# Patient Record
Sex: Female | Born: 1970 | Race: Black or African American | Hispanic: No | Marital: Single | Smoking: Never smoker
Health system: Southern US, Community
[De-identification: ages and names within clinical notes are randomized; demographics above are authoritative.]

## PROBLEM LIST (undated history)

## (undated) DIAGNOSIS — T8859XA Other complications of anesthesia, initial encounter: Secondary | ICD-10-CM

## (undated) DIAGNOSIS — T4145XA Adverse effect of unspecified anesthetic, initial encounter: Secondary | ICD-10-CM

## (undated) DIAGNOSIS — R51 Headache: Secondary | ICD-10-CM

## (undated) DIAGNOSIS — G43909 Migraine, unspecified, not intractable, without status migrainosus: Secondary | ICD-10-CM

## (undated) DIAGNOSIS — Z5189 Encounter for other specified aftercare: Secondary | ICD-10-CM

## (undated) DIAGNOSIS — R7309 Other abnormal glucose: Secondary | ICD-10-CM

## (undated) DIAGNOSIS — K219 Gastro-esophageal reflux disease without esophagitis: Secondary | ICD-10-CM

## (undated) DIAGNOSIS — M329 Systemic lupus erythematosus, unspecified: Secondary | ICD-10-CM

## (undated) DIAGNOSIS — M1990 Unspecified osteoarthritis, unspecified site: Secondary | ICD-10-CM

## (undated) HISTORY — DX: Encounter for other specified aftercare: Z51.89

## (undated) HISTORY — PX: ABDOMINAL HYSTERECTOMY: SHX81

## (undated) HISTORY — DX: Other abnormal glucose: R73.09

## (undated) HISTORY — DX: Systemic lupus erythematosus, unspecified: M32.9

## (undated) HISTORY — PX: OVARIAN CYST SURGERY: SHX726

## (undated) HISTORY — PX: JOINT REPLACEMENT: SHX530

---

## 1998-09-26 ENCOUNTER — Encounter: Payer: Self-pay | Admitting: Obstetrics and Gynecology

## 1998-09-26 ENCOUNTER — Ambulatory Visit (HOSPITAL_COMMUNITY): Admission: RE | Admit: 1998-09-26 | Discharge: 1998-09-26 | Payer: Self-pay | Admitting: Obstetrics and Gynecology

## 1998-10-08 ENCOUNTER — Inpatient Hospital Stay (HOSPITAL_COMMUNITY): Admission: AD | Admit: 1998-10-08 | Discharge: 1998-10-11 | Payer: Self-pay | Admitting: Obstetrics and Gynecology

## 1998-10-08 ENCOUNTER — Encounter (INDEPENDENT_AMBULATORY_CARE_PROVIDER_SITE_OTHER): Payer: Self-pay | Admitting: Specialist

## 1998-10-12 ENCOUNTER — Encounter (HOSPITAL_COMMUNITY): Admission: RE | Admit: 1998-10-12 | Discharge: 1999-01-10 | Payer: Self-pay | Admitting: Obstetrics and Gynecology

## 2002-05-15 ENCOUNTER — Other Ambulatory Visit: Admission: RE | Admit: 2002-05-15 | Discharge: 2002-05-15 | Payer: Self-pay | Admitting: Obstetrics and Gynecology

## 2002-12-12 ENCOUNTER — Ambulatory Visit (HOSPITAL_COMMUNITY): Admission: RE | Admit: 2002-12-12 | Discharge: 2002-12-12 | Payer: Self-pay | Admitting: Obstetrics and Gynecology

## 2003-01-16 ENCOUNTER — Ambulatory Visit (HOSPITAL_COMMUNITY): Admission: RE | Admit: 2003-01-16 | Discharge: 2003-01-16 | Payer: Self-pay | Admitting: Obstetrics and Gynecology

## 2003-01-16 ENCOUNTER — Encounter (INDEPENDENT_AMBULATORY_CARE_PROVIDER_SITE_OTHER): Payer: Self-pay | Admitting: Specialist

## 2003-08-16 ENCOUNTER — Other Ambulatory Visit: Admission: RE | Admit: 2003-08-16 | Discharge: 2003-08-16 | Payer: Self-pay | Admitting: Obstetrics and Gynecology

## 2005-01-26 HISTORY — PX: OOPHORECTOMY: SHX86

## 2005-04-03 ENCOUNTER — Other Ambulatory Visit: Admission: RE | Admit: 2005-04-03 | Discharge: 2005-04-03 | Payer: Self-pay | Admitting: Obstetrics and Gynecology

## 2005-05-28 ENCOUNTER — Emergency Department (HOSPITAL_COMMUNITY): Admission: EM | Admit: 2005-05-28 | Discharge: 2005-05-28 | Payer: Self-pay | Admitting: Emergency Medicine

## 2008-06-12 ENCOUNTER — Ambulatory Visit (HOSPITAL_COMMUNITY): Admission: RE | Admit: 2008-06-12 | Discharge: 2008-06-12 | Payer: Self-pay | Admitting: Obstetrics and Gynecology

## 2008-06-12 ENCOUNTER — Encounter (INDEPENDENT_AMBULATORY_CARE_PROVIDER_SITE_OTHER): Payer: Self-pay | Admitting: Obstetrics and Gynecology

## 2009-06-03 ENCOUNTER — Encounter: Admission: RE | Admit: 2009-06-03 | Discharge: 2009-06-03 | Payer: Self-pay | Admitting: Orthopedic Surgery

## 2009-12-23 ENCOUNTER — Ambulatory Visit: Payer: Self-pay | Admitting: Vascular Surgery

## 2010-05-06 LAB — CBC
HCT: 40.6 % (ref 36.0–46.0)
Hemoglobin: 14 g/dL (ref 12.0–15.0)
MCHC: 34.4 g/dL (ref 30.0–36.0)
MCV: 88.8 fL (ref 78.0–100.0)
Platelets: 204 10*3/uL (ref 150–400)
RBC: 4.57 MIL/uL (ref 3.87–5.11)
RDW: 13.2 % (ref 11.5–15.5)
WBC: 6.9 10*3/uL (ref 4.0–10.5)

## 2010-05-06 LAB — URINALYSIS, MICROSCOPIC ONLY
Nitrite: NEGATIVE
Protein, ur: NEGATIVE mg/dL
Specific Gravity, Urine: 1.015 (ref 1.005–1.030)
Urobilinogen, UA: 0.2 mg/dL (ref 0.0–1.0)

## 2010-05-06 LAB — PREGNANCY, URINE: Preg Test, Ur: NEGATIVE

## 2010-06-10 NOTE — Op Note (Signed)
NAME:  Julia Monroe, Julia Monroe NO.:  [id number]   MEDICAL RECORD NO.:  [id number]          PATIENT TYPE:  AMB   LOCATION:  SDC                           FACILITY:  [facility]   PHYSICIAN:  Randye Lobo, M.D.   DATE OF BIRTH:  10-31-1970   DATE OF PROCEDURE:  06/12/2008  DATE OF DISCHARGE:  06/12/2008                               OPERATIVE REPORT   PREOPERATIVE DIAGNOSIS:  Complex left adnexal mass.   POSTOPERATIVE DIAGNOSES:  Left ovarian dermoid cyst, mild endometriosis,  right ovarian remnant.   PROCEDURE:  Laparoscopic left ovarian cystectomy with fulguration of  endometriosis and lysis of cul-de-sac adhesion.   SURGEON:  Randye Lobo, MD   ASSISTANT:  Luvenia Redden, MD   ANESTHESIA:  General endotracheal.   IV FLUIDS:  800 mL Ringer's lactate.   ESTIMATED BLOOD LOSS:  Minimal.   URINE OUTPUT:  150 mL.   COMPLICATIONS:  None.   INDICATIONS FOR THE PROCEDURE:  The patient is a 40 year old gravida 1,  para 1, African American female, status post right salpingo-oophorectomy  for a dermoid cyst in 2004, who presented with left lower quadrant pain  which she described as equivalent to which she had when she was  diagnosed with her right ovarian dermoid cyst.  Ultrasound May 17, 2008 documented a complex left adnexal mass which appeared to be  consistent with a probable dermoid cyst.  The patient had negative  gonorrhea and Chlamydia cultures at the same time.  The patient desired  surgical treatment of the pain and removal of the ovarian cyst.   Of note, the patient at the time of her prior laparoscopy had a normal-  appearing left ovary both by ultrasound evaluation and by laparoscopic  evaluation.   A plan is now made to proceed with a left ovarian cystectomy after  risks, benefits, and alternatives are reviewed.   FINDINGS:  Laparoscopy did demonstrate 2.5 cm left ovarian dermoid cyst  which had both sebaceous material and hair present.  The left  fallopian  tube was unremarkable.  In the right adnexal region, there was a small  area of normal-appearing ovarian tissue adjacent to the  infundibulopelvic ligament.  This area was very small and measured  probably 4-5 mm in diameter.   There was also evidence of endometriosis in the anterior cul-de-sac  along the vesicouterine fold and in the posterior cul-de-sac on both the  right and left sides.  There were also some filmy adhesions extending  from the posterior cul-de-sac in the midline over to the right cul-de-  sac.  This adhesion was lysed entirely.   In the upper abdomen, the liver appeared to be normal and the appendix  was also unremarkable.  There was no evidence of any adhesive disease in  the abdomen.   SPECIMENS:  The left ovarian cyst was sent to pathology.   PROCEDURE:  The patient was reidentified in the preoperative hold area.  She was taken to the operating room after she received Ancef for  antibiotic prophylaxis and TED hose and PAS stockings for DVT  prophylaxis.  The patient was placed in the supine position and general endotracheal  anesthesia was induced.  She was then placed in the dorsal lithotomy  position.  The abdomen, vagina, and perineum were then sterilely prepped  and draped.  A Foley catheter was placed inside the bladder.  The  speculum was placed inside the vagina and a single-tooth tenaculum was  placed on the anterior cervical lip.  This was replaced with a Hulka  tenaculum.   The procedure then began by creating a 1 cm vertical umbilical incision.  An attempt was made to place a 10-mm regular length trocar directly into  the abdomen, however, the patient had a fairly large panniculus and  instead the 10-mm reusable long trocar was used to accomplish this  without difficulty.   The laparoscope was inserted and confirmed proper placement of the  umbilical trocar and the CO2 pneumoperitoneum was achieved and the  patient was then placed  in the Trendelenburg position.  A 5-mm incisions  were then created in the right and left lower quadrants and 5-mm trocars  were placed under direct visualization of the laparoscope.   An evaluation of the pelvis and the abdomen was then performed and the  findings are as noted above.   The left ovary was grasped and the monopolar cautery was then used to  score the serosa overlying the dermoid cyst.  Using blunt technique, the  cyst was then shelled out of the surrounding ovarian tissue.  Hemostasis  was created with a combination of monopolar cautery and the Kleppinger  forceps.  During the enucleation of the specimen, the dermoid cyst was  entered, and the sebaceous material was appreciated.  This was suctioned  with the suction irrigator.   With the specimen completely removed and placed in the cul-de-sac, the  left lower quadrant incision was then enlarged to accommodate a 10-11 mm  reusable trocar which was placed under visualization of laparoscope.  The EndoCatch was then placed into the pelvis and the specimen was  removed from the abdominal cavity.  It was sent to pathology.   The trocar was then replaced again in the left lower quadrant incision  under visualization of the laparoscope and the pelvis was then irrigated  and suctioned.  The bed of the ovarian cystectomy site was then  cauterized with the Kleppinger forceps to create good hemostasis.   The lesions of endometriosis in the anterior and posterior cul-de-sacs  were then cauterized with the Kleppinger forceps.  The adhesions in the  cul-de-sac was sharply lysed and made hemostatic with a Kleppinger  forceps.   The pelvis was then irrigated and suctioned and all operative sites were  hemostatic and the procedure was therefore completed.  The lower  abdominal trocars were removed under visualization of the laparoscope.  The pneumoperitoneum was released and the 10-mm umbilical trocar and the  laparoscope were  removed simultaneously.   The left lower quadrant fascial incision was closed with figure-of-eight  suture of 0 Vicryl.  The skin was closed with subcuticular sutures of 3-  0 plain gut suture.  Sterile bandages were placed over the incisions.  The vaginal instruments and Foley catheter were removed.   This concluded the patient's procedure.  There were no complications.  All needle, instrument, sponge count were correct.  The patient is  escorted to the recovery room in stable and awake condition.      Randye Lobo, M.D.  Electronically Signed     BES/MEDQ  D:  06/12/2008  T:  06/13/2008  Job:  [id number]

## 2010-06-10 NOTE — Procedures (Signed)
LOWER EXTREMITY VENOUS REFLUX EXAM   INDICATION:   EXAM:  Using color-flow imaging and pulse Doppler spectral analysis, the  right and left common femoral, superficial femoral, popliteal, posterior  tibial, greater and lesser saphenous veins were evaluated.  There is no  evidence suggesting deep venous insufficiency in the right and left  lower extremity.   The right and left saphenofemoral junction is competent.  The right and  left GSV is competent.   The right and left proximal short saphenous veins demonstrate  competency.   GSV Diameter (used if found to be incompetent only)                                            Right    Left  Proximal Greater Saphenous Vein           cm       cm  Proximal-to-mid-thigh                     cm       cm  Mid thigh                                 cm       cm  Mid-distal thigh                          cm       cm  Distal thigh                              cm       cm  Knee                                      cm       cm   IMPRESSION:  1. The right and left greater saphenous vein is competent.  2. The right and left greater saphenous vein is not tortuous.  3. The deep venous system is competent.  4. The right and left short saphenous vein is competent.  5. There is a focal area of reflux within the right greater saphenous      vein proximal thigh in the lateral branch adjacent to a varicose      vein.  6. Discussed these findings with Dr. Hart Rochester.         ___________________________________________  Quita Skye. Hart Rochester, M.D.   OD/MEDQ  D:  12/23/2009  T:  12/23/2009  Job:  [id number]

## 2010-06-10 NOTE — Consult Note (Signed)
NEW PATIENT CONSULTATION   Julia Monroe, Julia Monroe  DOB:  02-21-1970                                       12/23/2009  ZOXWR#:[id number]   Patient is a 40 year old female patient referred for venous  insufficiency of both lower extremities, right worse than left.  She has  2 jobs which require her to be on her feet all day, and she develops an  aching, throbbing discomfort in the anterior thigh extending down into  the calf areas bilaterally as the day progresses.  She gets some  improvement with elevation but is unable to do that while at work.  She  has worn some long-leg stockings the last month or so with some mild  improvement and does take Aleve on a daily basis for arthritis.  She has  a history of DVT, thrombophlebitis, distal edema, or skin changes.   CHRONIC MEDICAL PROBLEMS:  None.  Denies diabetes, hypertension,  hyperlipidemia, coronary artery disease, COPD, or stroke.   SOCIAL HISTORY:  She is single, has 1 child.  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  She  does not use tobacco or alcohol.   FAMILY HISTORY:  Negative for coronary artery disease, diabetes, and  stroke, but her mother has bad varicose veins.   REVIEW OF SYSTEMS:  Positive for leg discomfort with walking.  Leg  discomfort while in the supine position.  Arthritis joint pain,  particular in her hips.  Muscle pain.  All other systems in complete  review of systems negative.   PHYSICAL EXAMINATION:  Blood pressure of 102/78, heart rate 72,  respirations 24.  General:  She is an obese female who is alert and  oriented x3.  She is well developed.  HEENT:  Normal for age.  EOMs  intact.  Lungs:  Clear to auscultation.  No rhonchi or wheezing.  Cardiovascular:  Regular rhythm.  No murmurs.  Carotid pulses are 3+.  No bruits audible.  Abdomen:  Soft, nontender with no masses.  Musculoskeletal:  Free of major deformities.  Neurologic:   Normal.  Skin:  Free of rashes.  Lower extremity exam reveals 3+ femoral,  popliteal, and dorsalis pedis pulses palpable bilaterally.  She has a  few small superficial varicosities in the right anterior thigh, in the  left anterior thigh, and lateral to the knee.  There is no distal edema,  hyperpigmentation, or other abnormalities noted.   Today I ordered venous duplex exam which I reviewed and interpreted.  The left deep system is normal.  The right deep system has mild reflux.  There is no DVT, and the great saphenous systems bilaterally are free of  reflux.  There is a small segment of reflux in the lateral accessory  branch on the right side in the midportion but not up at the  saphenofemoral junction level.   I reassured her regarding these findings and have recommended elastic  compression stockings and elevation as she is able to do.  If she should  decide she wants these treated, they could be treated with sclerotherapy  and if they progress over the years, she will be back in touch with [place],  as she may develop reflux in the great saphenous system as time passes.     Quita Skye Hart Rochester, M.D.  Electronically Signed  JDL/MEDQ  D:  12/23/2009  T:  12/23/2009  Job:  [id number]

## 2010-06-13 NOTE — Op Note (Signed)
NAME:  Julia Monroe, Julia Monroe                        ACCOUNT NO.:  [id number]   MEDICAL RECORD NO.:  [id number]                   PATIENT TYPE:  AMB   LOCATION:  SDC                                  FACILITY:  [facility]   PHYSICIAN:  Randye Lobo, M.D.                DATE OF BIRTH:  24-Jul-1970   DATE OF PROCEDURE:  01/16/2003  DATE OF DISCHARGE:                                 OPERATIVE REPORT   PREOPERATIVE DIAGNOSIS:  Right ovarian dermoid cyst.   POSTOPERATIVE DIAGNOSIS:  Right ovarian dermoid cyst.   PROCEDURE:  Laparoscopic right salpingo-oophorectomy.   SURGEON:  Randye Lobo, M.D.   ASSISTANT:  Carrington Clamp, M.D.   ANESTHESIA:  General endotracheal, local with 0.25% Marcaine.   FLUIDS REPLACED:  2300 mL Ringer's lactate.   ESTIMATED BLOOD LOSS:  Minimal.   URINE OUTPUT:  300 mL.   COMPLICATIONS:  None.   INDICATION FOR PROCEDURE:  The patient is a 40 year old gravida 1, para 1,  African-American female who presented to the office reporting right lower  quadrant and right incisional pain along her Pfannenstiel incision from a  previous cesarean delivery performed on October 08, 1998.  The patient had  a CT scan performed to evaluate the incision to rule out an incisional  hernia.  There was no evidence of a hernia; however, there was evidence of a  right ovarian cyst, which was thought to be consistent with a dermoid.  The  patient in follow-up had a pelvic ultrasound, which confirmed the presence  of a right ovarian cyst measuring 3.3 x 2.9 x 3.2 cm in diameter, which was  thought to be consistent with a dermoid.  The left ovary contained a simple  follicular cyst and no evidence of a dermoid.  A discussion was held with  the patient regarding her diagnosis, and a plan was made to proceed with a  right ovarian cystectomy and potentially a right salpingo-oophorectomy.  The  patient chose to proceed after the risks, benefits, and alternatives were  discussed with  her.   FINDINGS:  The patient is noted to have a very large pannus of the abdomen.   Laparoscopically, the patient has an enlarged right ovary measuring  approximately 4.5-5 cm in total diameter.  The left ovary contains multiple  follicular cysts.  Both of the fallopian tubes and the uterus are noted to  be normal.  There is no evidence of any adhesions nor endometriosis in the  abdomen or pelvis.  The liver had a normal appearance.   SPECIMENS:  The right tube and ovary were sent to pathology.   DESCRIPTION OF PROCEDURE:  The patient was re-identified in the preoperative  hold area.  The patient did receive Ancef 1 g intravenously for antibiotic  prophylaxis.  In the operating room, the patient was placed in the supine  position on the operating room table and general endotracheal anesthesia was  induced.  The patient was then placed in a dorsal lithotomy position.  The  abdomen and the vagina were then sterilely prepped and a Foley catheter was  placed inside the bladder.  A speculum was placed inside the vagina and a  single-tooth tenaculum was placed on the anterior cervical lip.  This was  replaced with a Hulka tenaculum and the remaining vaginal instruments were  removed.  The patient was sterilely draped.   Attention was turned to the abdomen, where a 1 cm umbilical incision was  created vertically and sharply with a scalpel.  This was carried down to the  subcutaneous tissue and the fascial region using an Allis clamp.  A regular-  length 10 mm trocar was then inserted into the peritoneal cavity; however,  the length of the trocar was not adequate and the peritoneum was not pierced  with the trocar.  This trocar was removed and the extra-long umbilical  trocar was then used and inserted directly into the peritoneal cavity  successfully.  The laparoscope confirmed proper placement, and a  pneumoperitoneum was achieved with CO2 gas.   The patient was then placed in a  Trendelenburg position.  A 5 mm right lower  quadrant incision was created underneath the patient's pannus, and a 5 mm  trocar was placed under direct visualization of the laparoscope.  A blunt-  tip probe was then used to examine the pelvic organs and the abdominal  organs, and the findings are as noted above.   A 5 mm suprapubic incision was then created approximately 2.5 cm above the  pubic symphysis.  There was difficulty with placement of the trocar,  initially due to the thickness of the abdominal wall and due to scarring  from the patient's previous cesarean section.  A 5 mm incision was therefore  created in the left lower quadrant, but again it was difficult to place the  trocar under visualization of the laparoscope, and this site was therefore  abandoned for placement.  The peritoneal cavity was not entered during this  attempt.  Ultimately, the suprapubic incision was used and the trocar could  be inserted successfully and under vision of the laparoscope.   There was significant weight of the abdominal wall during the procedure,  making the working space small, and a decision was made to proceed with a  right salpingo-oophorectomy.  The tripolar instrument was used to first  cauterize the utero-ovarian ligament.  It was then divided sharply with the  tripolar instrument.  The right ureter was identified, and the  infundibulopelvic ligament was then grasped above the level of the ureter  with the tripolar instrument, and then it was cauterized and successfully  divided.  The dissection continued along the broad ligament with the  tripolar instrument using cautery and cutting to complete the excision of  the fallopian tube and the ovary on the patient's right-hand side.  The  proximal right fallopian tube was then grasped with the tripolar instrument,  cauterized, and sharply divided.  The specimen was noted to be free at this point and was then placed in the anterior  cul-de-sac.  The operative sites  were re-examined and were re-cauterized with the tripolar instrument again  to create hemostasis in an area that was well away from the ureter.   The 5 mm right lower quadrant incision was then extended to accommodate a 10  mm trocar, which was placed under visualization of a laparoscope.  The  EndoCatch was then inserted  through the right lower quadrant incision and  the right fallopian tube and ovary were placed in the bag and brought up  through the uterine incision while removing the trocar as well.  The edges  of the plastic bag were grasped and the bag was opened to allow removal of  the fallopian tube and ovary, which was accomplished with morcellation.  Ultimately the dermoid cyst was opened sharply with a scalpel in order to  reduce tension on the cyst, and the bag and the remaining portion of ovary  were removed simultaneously.  There was no evidence of any spill inside the  peritoneal cavity.  The right tube and ovary were sent to pathology.   The 12 mm right lower quadrant trocar was then replaced under visualization  of the laparoscope and the operative site was re-examined.  The mesosalpinx  was re-cauterized with the tripolar instrument and hemostasis was noted to  be excellent at this time.  The pneumoperitoneum was then partially relieved  and again hemostasis was noted to be excellent, and the procedure was  therefore concluded.   The suprapubic trocar was removed under visualization of the laparoscope,  followed by the right lower quadrant trocar.  The umbilical trocar and the  laparoscope were removed simultaneously.   The superficial fascia of the right lower quadrant incision was then closed  with a single through-and-through suture of 0 Vicryl.  The skin incisions  were all closed with subcuticular sutures of 3-0 plain.  Steri-Strips were  placed over the incisions.   The instruments were removed from the vagina and the Foley  catheter was  taken out.  The patient was cleansed of remaining Betadine.   This concluded the patient's surgery.  There were no complications.  All  needle, instrument, and sponge counts were correct.                                               Randye Lobo, M.D.    BES/MEDQ  D:  01/16/2003  T:  01/16/2003  Job:  [id number]

## 2010-06-18 ENCOUNTER — Ambulatory Visit: Payer: BC Managed Care – PPO | Attending: Family Medicine | Admitting: Physical Therapy

## 2010-06-18 ENCOUNTER — Ambulatory Visit: Payer: Self-pay | Admitting: Physical Therapy

## 2010-06-18 DIAGNOSIS — IMO0001 Reserved for inherently not codable concepts without codable children: Secondary | ICD-10-CM | POA: Insufficient documentation

## 2010-06-18 DIAGNOSIS — M25559 Pain in unspecified hip: Secondary | ICD-10-CM | POA: Insufficient documentation

## 2010-06-18 DIAGNOSIS — M25659 Stiffness of unspecified hip, not elsewhere classified: Secondary | ICD-10-CM | POA: Insufficient documentation

## 2010-06-18 DIAGNOSIS — R5381 Other malaise: Secondary | ICD-10-CM | POA: Insufficient documentation

## 2010-06-26 ENCOUNTER — Ambulatory Visit: Payer: BC Managed Care – PPO | Admitting: Physical Therapy

## 2010-06-26 ENCOUNTER — Encounter: Payer: Self-pay | Admitting: Physical Therapy

## 2010-07-01 ENCOUNTER — Ambulatory Visit: Payer: BC Managed Care – PPO | Attending: Family Medicine | Admitting: Physical Therapy

## 2010-07-01 ENCOUNTER — Encounter: Payer: Self-pay | Admitting: Physical Therapy

## 2010-07-01 DIAGNOSIS — M25559 Pain in unspecified hip: Secondary | ICD-10-CM | POA: Insufficient documentation

## 2010-07-01 DIAGNOSIS — M25659 Stiffness of unspecified hip, not elsewhere classified: Secondary | ICD-10-CM | POA: Insufficient documentation

## 2010-07-01 DIAGNOSIS — R5381 Other malaise: Secondary | ICD-10-CM | POA: Insufficient documentation

## 2010-07-01 DIAGNOSIS — IMO0001 Reserved for inherently not codable concepts without codable children: Secondary | ICD-10-CM | POA: Insufficient documentation

## 2010-07-08 ENCOUNTER — Ambulatory Visit: Payer: BC Managed Care – PPO | Admitting: Physical Therapy

## 2010-07-08 ENCOUNTER — Encounter: Payer: Self-pay | Admitting: Physical Therapy

## 2010-07-15 ENCOUNTER — Ambulatory Visit: Payer: BC Managed Care – PPO | Admitting: Physical Therapy

## 2010-07-15 ENCOUNTER — Encounter: Payer: Self-pay | Admitting: Physical Therapy

## 2010-07-22 ENCOUNTER — Encounter: Payer: Self-pay | Admitting: Physical Therapy

## 2010-07-22 ENCOUNTER — Ambulatory Visit: Payer: BC Managed Care – PPO | Admitting: Physical Therapy

## 2010-12-31 ENCOUNTER — Other Ambulatory Visit (HOSPITAL_BASED_OUTPATIENT_CLINIC_OR_DEPARTMENT_OTHER): Payer: Self-pay | Admitting: Rheumatology

## 2010-12-31 DIAGNOSIS — M25559 Pain in unspecified hip: Secondary | ICD-10-CM

## 2011-01-03 ENCOUNTER — Ambulatory Visit (HOSPITAL_BASED_OUTPATIENT_CLINIC_OR_DEPARTMENT_OTHER)
Admission: RE | Admit: 2011-01-03 | Discharge: 2011-01-03 | Disposition: A | Discharge status: Home / Self Care | Payer: BC Managed Care – PPO | Source: Ambulatory Visit | Attending: Rheumatology | Admitting: Rheumatology

## 2011-01-03 DIAGNOSIS — M25559 Pain in unspecified hip: Secondary | ICD-10-CM | POA: Insufficient documentation

## 2011-01-03 DIAGNOSIS — M899 Disorder of bone, unspecified: Secondary | ICD-10-CM

## 2011-01-03 DIAGNOSIS — M25659 Stiffness of unspecified hip, not elsewhere classified: Secondary | ICD-10-CM

## 2011-01-03 DIAGNOSIS — M169 Osteoarthritis of hip, unspecified: Secondary | ICD-10-CM | POA: Insufficient documentation

## 2011-01-03 DIAGNOSIS — E278 Other specified disorders of adrenal gland: Secondary | ICD-10-CM

## 2011-01-03 DIAGNOSIS — M1610 Unilateral primary osteoarthritis, unspecified hip: Secondary | ICD-10-CM | POA: Insufficient documentation

## 2011-05-28 ENCOUNTER — Other Ambulatory Visit: Payer: Self-pay | Admitting: Orthopedic Surgery

## 2011-06-26 ENCOUNTER — Encounter (HOSPITAL_COMMUNITY): Payer: Self-pay | Admitting: Pharmacy Technician

## 2011-06-29 ENCOUNTER — Encounter (HOSPITAL_COMMUNITY)
Admission: RE | Admit: 2011-06-29 | Discharge: 2011-06-29 | Disposition: A | Discharge status: Home / Self Care | Payer: BC Managed Care – PPO | Source: Ambulatory Visit | Attending: Orthopedic Surgery | Admitting: Orthopedic Surgery

## 2011-06-29 ENCOUNTER — Encounter (HOSPITAL_COMMUNITY): Payer: Self-pay

## 2011-06-29 HISTORY — DX: Unspecified osteoarthritis, unspecified site: M19.90

## 2011-06-29 HISTORY — DX: Gastro-esophageal reflux disease without esophagitis: K21.9

## 2011-06-29 HISTORY — DX: Headache: R51

## 2011-06-29 LAB — CBC
HCT: 38.5 % (ref 36.0–46.0)
MCH: 27.9 pg (ref 26.0–34.0)
MCV: 85.2 fL (ref 78.0–100.0)
RDW: 14.7 % (ref 11.5–15.5)
WBC: 10.9 10*3/uL — ABNORMAL HIGH (ref 4.0–10.5)

## 2011-06-29 LAB — DIFFERENTIAL
Basophils Relative: 0 % (ref 0–1)
Eosinophils Absolute: 0.1 10*3/uL (ref 0.0–0.7)
Lymphs Abs: 2.7 10*3/uL (ref 0.7–4.0)
Monocytes Absolute: 0.7 10*3/uL (ref 0.1–1.0)
Monocytes Relative: 7 % (ref 3–12)
Neutro Abs: 7.3 10*3/uL (ref 1.7–7.7)

## 2011-06-29 LAB — SURGICAL PCR SCREEN
MRSA, PCR: NEGATIVE
Staphylococcus aureus: NEGATIVE

## 2011-06-29 LAB — URINALYSIS, ROUTINE W REFLEX MICROSCOPIC
Bilirubin Urine: NEGATIVE
Glucose, UA: NEGATIVE mg/dL
Hgb urine dipstick: NEGATIVE
Specific Gravity, Urine: 1.029 (ref 1.005–1.030)
Urobilinogen, UA: 0.2 mg/dL (ref 0.0–1.0)

## 2011-06-29 LAB — HCG, SERUM, QUALITATIVE: Preg, Serum: NEGATIVE

## 2011-06-29 LAB — PROTIME-INR: INR: 1.11 (ref 0.00–1.49)

## 2011-06-29 LAB — ABO/RH: ABO/RH(D): B POS

## 2011-06-29 MED ORDER — CHLORHEXIDINE GLUCONATE 4 % EX LIQD: 60.0000 mL | Freq: Once | CUTANEOUS | Status: DC

## 2011-06-29 NOTE — Pre-Procedure Instructions (Addendum)
20 Julia Monroe  06/29/2011   Your procedure is scheduled on:  *07/06/11  Report to [facility] at530* AM.  Call this number if you have problems the morning of surgery: [phone]   Remember:   Do not eat food:After Midnight.  May have clear liquids: up to 4 Hours before arrival 130 am.  Clear liquids include soda, tea, black coffee, apple or grape juice, broth.  Take these medicines the morning of surgery with A SIP OF WATER: tylenol #3 or tramodol  STOP diclfenac, multi vit, naproxen now   Do not wear jewelry, make-up or nail polish.  Do not wear lotions, powders, or perfumes. You may wear deodorant.  Do not shave 48 hours prior to surgery. Men may shave face and neck.  Do not bring valuables to the hospital.  Contacts, dentures or bridgework may not be worn into surgery.  Leave suitcase in the car. After surgery it may be brought to your room.  For patients admitted to the hospital, checkout time is 11:00 AM the day of discharge.   Patients discharged the day of surgery will not be allowed to drive home.  Name and phone number of your [phone] mom Julia Monroe   Special Instructions: Incentive Spirometry - Practice and bring it with you on the day of surgery. and CHG Shower Use Special Wash: 1/2 bottle night before surgery and 1/2 bottle morning of surgery.   Please read over the following fact sheets that you were given: Pain Booklet, Coughing and Deep Breathing, Blood Transfusion Information, Total Joint Packet, MRSA Information and Surgical Site Infection Prevention

## 2011-07-01 NOTE — H&P (Signed)
  Subjective: Julia Monroe is a 41 year old patient who has been seen by Dr. Madelon Lips for evaluation of left greater than right hip pain.  She reports that her pain began approximately 2 years ago.  She has tried several different oral anti-inflammatories and has had 2 intra-articular steroid injections with minimal relief.  At this point she is quite frustrated because her left hip pain limits her activities and makes it difficult for her to ambulate after prolonged sitting.  CURRENT MEDS:     Tramadol p.r.n., glucosamine, and ibuprofen.  ALLERGIES:     Migraine injection medication (she thinks) and naproxen.  FAMILY HISTORY:     Non-contributory  SOCIAL HISTORY:     Nonsmoker.  Does not drink any alcohol.  Drinks decaffeinated coffee. Does not exercise.  REVIEW OF SYSTEMS:     Systemic review is remarkable for pain and tenderness. The rest of the systems are negative.  Please refer to Rheumatology visit form for details.  Objective: Well-developed and well-nourished 41 year old female.  She is 5 feet 9 inches tall and weighs 310 pounds.  BMI is 46.  Examination of the left hip demonstrates pain with internal and external rotation.  Foot tap is negative.  The right hip has minimal pain with range of motion.  Neurovascular exam is within normal limits.  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  At this point she is a candidate for left hip replacement, however we have asked that she try to get her weight down to 275 to bring her BMI closer to 40.  This will reduce many of the surgical risks.  In the meantime, she'll continue using tramadol and Tylenol for pain control.  We've also given her a prescription for Relafen to be taken twice daily.  She was advised to try exercising in  the pool and to focus on dietary changes for weight loss.

## 2011-07-05 MED ORDER — CEFAZOLIN SODIUM-DEXTROSE 2-3 GM-% IV SOLR
2.0000 g | INTRAVENOUS | Status: AC
  Administered 2011-07-06: 2 g via INTRAVENOUS
  Filled 2011-07-05: qty 50

## 2011-07-06 ENCOUNTER — Ambulatory Visit (HOSPITAL_COMMUNITY): Payer: BC Managed Care – PPO | Admitting: Anesthesiology

## 2011-07-06 ENCOUNTER — Encounter (HOSPITAL_COMMUNITY)
Admission: RE | Disposition: A | Discharge status: Home / Self Care | Payer: Self-pay | Source: Ambulatory Visit | Attending: Orthopedic Surgery

## 2011-07-06 ENCOUNTER — Encounter (HOSPITAL_COMMUNITY): Payer: Self-pay | Admitting: *Deleted

## 2011-07-06 ENCOUNTER — Encounter (HOSPITAL_COMMUNITY): Payer: Self-pay | Admitting: Anesthesiology

## 2011-07-06 ENCOUNTER — Inpatient Hospital Stay (HOSPITAL_COMMUNITY): Payer: BC Managed Care – PPO

## 2011-07-06 ENCOUNTER — Inpatient Hospital Stay (HOSPITAL_COMMUNITY)
Admission: RE | Admit: 2011-07-06 | Discharge: 2011-07-10 | DRG: 818 | Disposition: A | Discharge status: Home / Self Care | Payer: BC Managed Care – PPO | Source: Ambulatory Visit | Attending: Orthopedic Surgery | Admitting: Orthopedic Surgery

## 2011-07-06 DIAGNOSIS — K219 Gastro-esophageal reflux disease without esophagitis: Secondary | ICD-10-CM | POA: Diagnosis present

## 2011-07-06 DIAGNOSIS — M1610 Unilateral primary osteoarthritis, unspecified hip: Principal | ICD-10-CM | POA: Diagnosis present

## 2011-07-06 DIAGNOSIS — M169 Osteoarthritis of hip, unspecified: Secondary | ICD-10-CM

## 2011-07-06 DIAGNOSIS — D62 Acute posthemorrhagic anemia: Secondary | ICD-10-CM | POA: Diagnosis not present

## 2011-07-06 DIAGNOSIS — M1611 Unilateral primary osteoarthritis, right hip: Secondary | ICD-10-CM | POA: Diagnosis present

## 2011-07-06 DIAGNOSIS — Z6841 Body Mass Index (BMI) 40.0 and over, adult: Secondary | ICD-10-CM

## 2011-07-06 HISTORY — PX: TOTAL HIP ARTHROPLASTY: SHX124

## 2011-07-06 SURGERY — ARTHROPLASTY, HIP, TOTAL,POSTERIOR APPROACH
Anesthesia: General | Site: Hip | Laterality: Left | Wound class: Clean

## 2011-07-06 MED ORDER — METOCLOPRAMIDE HCL 5 MG/ML IJ SOLN: 5.0000 mg | Freq: Three times a day (TID) | INTRAMUSCULAR | Status: DC | PRN

## 2011-07-06 MED ORDER — SUCCINYLCHOLINE CHLORIDE 20 MG/ML IJ SOLN
INTRAMUSCULAR | Status: DC | PRN
  Administered 2011-07-06: 120 mg via INTRAVENOUS

## 2011-07-06 MED ORDER — ROCURONIUM BROMIDE 100 MG/10ML IV SOLN
INTRAVENOUS | Status: DC | PRN
  Administered 2011-07-06: 10 mg via INTRAVENOUS
  Administered 2011-07-06: 40 mg via INTRAVENOUS

## 2011-07-06 MED ORDER — ONDANSETRON HCL 4 MG/2ML IJ SOLN: 4.0000 mg | Freq: Once | INTRAMUSCULAR | Status: DC | PRN

## 2011-07-06 MED ORDER — OXYCODONE-ACETAMINOPHEN 5-325 MG PO TABS
1.0000 | ORAL_TABLET | ORAL | Status: DC | PRN
  Administered 2011-07-06 – 2011-07-07 (×4): 2 via ORAL
  Administered 2011-07-07: 1 via ORAL
  Administered 2011-07-07: 2 via ORAL
  Administered 2011-07-07: 1 via ORAL
  Administered 2011-07-07 – 2011-07-10 (×11): 2 via ORAL
  Filled 2011-07-06 (×19): qty 2

## 2011-07-06 MED ORDER — MORPHINE SULFATE 10 MG/ML IJ SOLN
INTRAMUSCULAR | Status: DC | PRN
  Administered 2011-07-06: 4 mg via INTRAVENOUS
  Administered 2011-07-06: 2 mg via INTRAVENOUS
  Administered 2011-07-06: 4 mg via INTRAVENOUS

## 2011-07-06 MED ORDER — ACETAMINOPHEN 650 MG RE SUPP: 650.0000 mg | Freq: Four times a day (QID) | RECTAL | Status: DC | PRN

## 2011-07-06 MED ORDER — GLYCOPYRROLATE 0.2 MG/ML IJ SOLN
INTRAMUSCULAR | Status: DC | PRN
  Administered 2011-07-06: .9 mg via INTRAVENOUS

## 2011-07-06 MED ORDER — MAGNESIUM HYDROXIDE 400 MG/5ML PO SUSP: 30.0000 mL | Freq: Every day | ORAL | Status: DC | PRN

## 2011-07-06 MED ORDER — VECURONIUM BROMIDE 10 MG IV SOLR
INTRAVENOUS | Status: DC | PRN
  Administered 2011-07-06: 3 mg via INTRAVENOUS
  Administered 2011-07-06: 2 mg via INTRAVENOUS

## 2011-07-06 MED ORDER — LACTATED RINGERS IV SOLN
INTRAVENOUS | Status: DC | PRN
  Administered 2011-07-06 (×2): via INTRAVENOUS

## 2011-07-06 MED ORDER — ONDANSETRON HCL 4 MG/2ML IJ SOLN
INTRAMUSCULAR | Status: DC | PRN
  Administered 2011-07-06: 4 mg via INTRAVENOUS

## 2011-07-06 MED ORDER — ONDANSETRON HCL 4 MG/2ML IJ SOLN: 4.0000 mg | Freq: Four times a day (QID) | INTRAMUSCULAR | Status: DC | PRN

## 2011-07-06 MED ORDER — LIDOCAINE HCL (CARDIAC) 20 MG/ML IV SOLN
INTRAVENOUS | Status: DC | PRN
  Administered 2011-07-06: 80 mg via INTRAVENOUS

## 2011-07-06 MED ORDER — MIDAZOLAM HCL 5 MG/5ML IJ SOLN
INTRAMUSCULAR | Status: DC | PRN
  Administered 2011-07-06: 1 mg via INTRAVENOUS

## 2011-07-06 MED ORDER — PHENOL 1.4 % MT LIQD: 1.0000 | OROMUCOSAL | Status: DC | PRN

## 2011-07-06 MED ORDER — METHOCARBAMOL 500 MG PO TABS
500.0000 mg | ORAL_TABLET | Freq: Four times a day (QID) | ORAL | Status: DC | PRN
  Administered 2011-07-06 – 2011-07-10 (×13): 500 mg via ORAL
  Filled 2011-07-06 (×15): qty 1

## 2011-07-06 MED ORDER — HYDROMORPHONE HCL PF 1 MG/ML IJ SOLN
0.2500 mg | INTRAMUSCULAR | Status: DC | PRN
  Administered 2011-07-06 (×3): 0.5 mg via INTRAVENOUS

## 2011-07-06 MED ORDER — FENTANYL CITRATE 0.05 MG/ML IJ SOLN
INTRAMUSCULAR | Status: DC | PRN
  Administered 2011-07-06: 100 ug via INTRAVENOUS
  Administered 2011-07-06: 200 ug via INTRAVENOUS
  Administered 2011-07-06 (×5): 50 ug via INTRAVENOUS
  Administered 2011-07-06: 100 ug via INTRAVENOUS
  Administered 2011-07-06: 50 ug via INTRAVENOUS

## 2011-07-06 MED ORDER — ACETAMINOPHEN 10 MG/ML IV SOLN
INTRAVENOUS | Status: AC
  Filled 2011-07-06: qty 100

## 2011-07-06 MED ORDER — HYDROMORPHONE HCL PF 1 MG/ML IJ SOLN
INTRAMUSCULAR | Status: AC
  Administered 2011-07-06: 1 mg via INTRAVENOUS
  Filled 2011-07-06: qty 1

## 2011-07-06 MED ORDER — PROPOFOL 10 MG/ML IV EMUL
INTRAVENOUS | Status: DC | PRN
  Administered 2011-07-06: 10 mg via INTRAVENOUS
  Administered 2011-07-06: 120 mg via INTRAVENOUS
  Administered 2011-07-06: 200 mg via INTRAVENOUS

## 2011-07-06 MED ORDER — MENTHOL 3 MG MT LOZG: 1.0000 | LOZENGE | OROMUCOSAL | Status: DC | PRN

## 2011-07-06 MED ORDER — ACETAMINOPHEN 10 MG/ML IV SOLN
INTRAVENOUS | Status: DC | PRN
  Administered 2011-07-06: 1000 mg via INTRAVENOUS

## 2011-07-06 MED ORDER — ASPIRIN EC 325 MG PO TBEC
325.0000 mg | DELAYED_RELEASE_TABLET | Freq: Two times a day (BID) | ORAL | Status: DC
  Administered 2011-07-06 – 2011-07-10 (×9): 325 mg via ORAL
  Filled 2011-07-06 (×10): qty 1

## 2011-07-06 MED ORDER — KCL IN DEXTROSE-NACL 20-5-0.45 MEQ/L-%-% IV SOLN
INTRAVENOUS | Status: DC
  Administered 2011-07-06 (×2): via INTRAVENOUS
  Filled 2011-07-06 (×15): qty 1000

## 2011-07-06 MED ORDER — HYDROMORPHONE HCL PF 1 MG/ML IJ SOLN
0.5000 mg | INTRAMUSCULAR | Status: DC | PRN
  Administered 2011-07-06 – 2011-07-10 (×12): 1 mg via INTRAVENOUS
  Filled 2011-07-06 (×13): qty 1

## 2011-07-06 MED ORDER — NEOSTIGMINE METHYLSULFATE 1 MG/ML IJ SOLN
INTRAMUSCULAR | Status: DC | PRN
  Administered 2011-07-06: 5 mg via INTRAVENOUS

## 2011-07-06 MED ORDER — ZOLPIDEM TARTRATE 5 MG PO TABS: 5.0000 mg | ORAL_TABLET | Freq: Every evening | ORAL | Status: DC | PRN

## 2011-07-06 MED ORDER — METOCLOPRAMIDE HCL 10 MG PO TABS: 5.0000 mg | ORAL_TABLET | Freq: Three times a day (TID) | ORAL | Status: DC | PRN

## 2011-07-06 MED ORDER — ACETAMINOPHEN 325 MG PO TABS
650.0000 mg | ORAL_TABLET | Freq: Four times a day (QID) | ORAL | Status: DC | PRN
  Administered 2011-07-08 – 2011-07-10 (×3): 650 mg via ORAL
  Filled 2011-07-06 (×3): qty 2

## 2011-07-06 MED ORDER — SODIUM CHLORIDE 0.9 % IR SOLN
Status: DC | PRN
  Administered 2011-07-06: 1000 mL

## 2011-07-06 MED ORDER — DEXTROSE-NACL 5-0.45 % IV SOLN: INTRAVENOUS | Status: DC

## 2011-07-06 MED ORDER — FLEET ENEMA 7-19 GM/118ML RE ENEM: 1.0000 | ENEMA | Freq: Once | RECTAL | Status: AC | PRN

## 2011-07-06 MED ORDER — LIDOCAINE HCL 4 % MT SOLN
OROMUCOSAL | Status: DC | PRN
  Administered 2011-07-06: 4 mL via TOPICAL

## 2011-07-06 MED ORDER — BISACODYL 5 MG PO TBEC
5.0000 mg | DELAYED_RELEASE_TABLET | Freq: Every day | ORAL | Status: DC | PRN
  Administered 2011-07-07 – 2011-07-10 (×3): 5 mg via ORAL
  Filled 2011-07-06 (×3): qty 1

## 2011-07-06 MED ORDER — ALUM & MAG HYDROXIDE-SIMETH 200-200-20 MG/5ML PO SUSP: 30.0000 mL | ORAL | Status: DC | PRN

## 2011-07-06 MED ORDER — ONDANSETRON HCL 4 MG PO TABS: 4.0000 mg | ORAL_TABLET | Freq: Four times a day (QID) | ORAL | Status: DC | PRN

## 2011-07-06 MED ORDER — DROPERIDOL 2.5 MG/ML IJ SOLN
INTRAMUSCULAR | Status: DC | PRN
  Administered 2011-07-06: 0.625 mg via INTRAVENOUS

## 2011-07-06 MED ORDER — OXYCODONE HCL 5 MG PO TABS
5.0000 mg | ORAL_TABLET | ORAL | Status: DC | PRN
  Administered 2011-07-06: 5 mg via ORAL
  Administered 2011-07-06 – 2011-07-10 (×9): 10 mg via ORAL
  Filled 2011-07-06 (×6): qty 2
  Filled 2011-07-06: qty 1
  Filled 2011-07-06 (×5): qty 2

## 2011-07-06 MED ORDER — METHOCARBAMOL 100 MG/ML IJ SOLN
500.0000 mg | Freq: Four times a day (QID) | INTRAVENOUS | Status: DC | PRN
  Administered 2011-07-06: 500 mg via INTRAVENOUS
  Filled 2011-07-06: qty 5

## 2011-07-06 MED ORDER — BUPIVACAINE-EPINEPHRINE 0.5% -1:200000 IJ SOLN
INTRAMUSCULAR | Status: DC | PRN
  Administered 2011-07-06: 30 mL

## 2011-07-06 MED ORDER — DIPHENHYDRAMINE HCL 12.5 MG/5ML PO ELIX: 12.5000 mg | ORAL_SOLUTION | ORAL | Status: DC | PRN

## 2011-07-06 MED ORDER — HYDROCODONE-ACETAMINOPHEN 5-325 MG PO TABS: 1.0000 | ORAL_TABLET | ORAL | Status: DC | PRN

## 2011-07-06 SURGICAL SUPPLY — 57 items
BLADE SAW SAG 73X25 THK (BLADE) ×1
BLADE SAW SGTL 18X1.27X75 (BLADE) IMPLANT
BLADE SAW SGTL 73X25 THK (BLADE) ×1 IMPLANT
BLADE SAW SGTL MED 73X18.5 STR (BLADE) IMPLANT
BRUSH FEMORAL CANAL (MISCELLANEOUS) IMPLANT
CLOTH BEACON ORANGE TIMEOUT ST (SAFETY) ×2 IMPLANT
COVER BACK TABLE 24X17X13 BIG (DRAPES) IMPLANT
COVER SURGICAL LIGHT HANDLE (MISCELLANEOUS) ×4 IMPLANT
DRAPE ORTHO SPLIT 77X108 STRL (DRAPES) ×2
DRAPE PROXIMA HALF (DRAPES) ×2 IMPLANT
DRAPE SURG ORHT 6 SPLT 77X108 (DRAPES) ×1 IMPLANT
DRAPE U-SHAPE 47X51 STRL (DRAPES) ×2 IMPLANT
DRILL BIT 7/64X5 (BIT) ×2 IMPLANT
DRSG MEPILEX BORDER 4X12 (GAUZE/BANDAGES/DRESSINGS) ×2 IMPLANT
DRSG MEPILEX BORDER 4X8 (GAUZE/BANDAGES/DRESSINGS) ×1 IMPLANT
DURAPREP 26ML APPLICATOR (WOUND CARE) ×2 IMPLANT
ELECT BLADE 4.0 EZ CLEAN MEGAD (MISCELLANEOUS) ×2
ELECT REM PT RETURN 9FT ADLT (ELECTROSURGICAL) ×2
ELECTRODE BLDE 4.0 EZ CLN MEGD (MISCELLANEOUS) IMPLANT
ELECTRODE REM PT RTRN 9FT ADLT (ELECTROSURGICAL) ×1 IMPLANT
GAUZE XEROFORM 1X8 LF (GAUZE/BANDAGES/DRESSINGS) ×2 IMPLANT
GLOVE BIO SURGEON STRL SZ 6.5 (GLOVE) ×1 IMPLANT
GLOVE BIO SURGEON STRL SZ7 (GLOVE) ×2 IMPLANT
GLOVE BIO SURGEON STRL SZ7.5 (GLOVE) ×3 IMPLANT
GLOVE BIOGEL PI IND STRL 6.5 (GLOVE) IMPLANT
GLOVE BIOGEL PI IND STRL 7.0 (GLOVE) ×1 IMPLANT
GLOVE BIOGEL PI IND STRL 8 (GLOVE) ×1 IMPLANT
GLOVE BIOGEL PI INDICATOR 6.5 (GLOVE) ×1
GLOVE BIOGEL PI INDICATOR 7.0 (GLOVE) ×2
GLOVE BIOGEL PI INDICATOR 8 (GLOVE) ×1
GLOVE SURG SS PI 6.5 STRL IVOR (GLOVE) ×1 IMPLANT
GOWN PREVENTION PLUS XLARGE (GOWN DISPOSABLE) ×2 IMPLANT
GOWN STRL NON-REIN LRG LVL3 (GOWN DISPOSABLE) ×5 IMPLANT
HANDPIECE INTERPULSE COAX TIP (DISPOSABLE)
HOOD PEEL AWAY FACE SHEILD DIS (HOOD) ×5 IMPLANT
KIT BASIN OR (CUSTOM PROCEDURE TRAY) ×2 IMPLANT
KIT ROOM TURNOVER OR (KITS) ×2 IMPLANT
MANIFOLD NEPTUNE II (INSTRUMENTS) ×2 IMPLANT
NEEDLE 22X1 1/2 (OR ONLY) (NEEDLE) ×2 IMPLANT
NS IRRIG 1000ML POUR BTL (IV SOLUTION) ×2 IMPLANT
PACK TOTAL JOINT (CUSTOM PROCEDURE TRAY) ×2 IMPLANT
PAD ARMBOARD 7.5X6 YLW CONV (MISCELLANEOUS) ×4 IMPLANT
PASSER SUT SWANSON 36MM LOOP (INSTRUMENTS) ×2 IMPLANT
PRESSURIZER FEMORAL UNIV (MISCELLANEOUS) IMPLANT
SET HNDPC FAN SPRY TIP SCT (DISPOSABLE) IMPLANT
SUT ETHIBOND 2 V 37 (SUTURE) ×2 IMPLANT
SUT ETHILON 3 0 FSL (SUTURE) ×2 IMPLANT
SUT VIC AB 0 CTB1 27 (SUTURE) ×2 IMPLANT
SUT VIC AB 1 CTX 36 (SUTURE) ×2
SUT VIC AB 1 CTX36XBRD ANBCTR (SUTURE) ×1 IMPLANT
SUT VIC AB 2-0 CTB1 (SUTURE) ×2 IMPLANT
SYR CONTROL 10ML LL (SYRINGE) ×2 IMPLANT
TOWEL OR 17X24 6PK STRL BLUE (TOWEL DISPOSABLE) ×2 IMPLANT
TOWEL OR 17X26 10 PK STRL BLUE (TOWEL DISPOSABLE) ×2 IMPLANT
TOWER CARTRIDGE SMART MIX (DISPOSABLE) IMPLANT
TRAY FOLEY CATH 14FR (SET/KITS/TRAYS/PACK) ×1 IMPLANT
WATER STERILE IRR 1000ML POUR (IV SOLUTION) ×6 IMPLANT

## 2011-07-06 NOTE — Preoperative (Signed)
Beta Blockers   Reason not to administer Beta Blockers:Not Applicable 

## 2011-07-06 NOTE — Anesthesia Preprocedure Evaluation (Signed)
Anesthesia Evaluation  Patient identified by MRN, date of birth, ID band Patient awake    Reviewed: Allergy & Precautions, H&P , NPO status , Patient's Chart, lab work & pertinent test results  Airway Mallampati: I TM Distance: >3 FB Neck ROM: full    Dental   Pulmonary          Cardiovascular Rhythm:regular Rate:Normal     Neuro/Psych  Headaches,    GI/Hepatic GERD-  ,  Endo/Other    Renal/GU      Musculoskeletal   Abdominal   Peds  Hematology   Anesthesia Other Findings   Reproductive/Obstetrics                           Anesthesia Physical Anesthesia Plan  ASA: I  Anesthesia Plan: General   Post-op Pain Management:    Induction: Intravenous  Airway Management Planned: Oral ETT  Additional Equipment:   Intra-op Plan:   Post-operative Plan: Extubation in OR  Informed Consent: I have reviewed the patients History and Physical, chart, labs and discussed the procedure including the risks, benefits and alternatives for the proposed anesthesia with the patient or authorized representative who has indicated his/her understanding and acceptance.     Plan Discussed with: CRNA, Anesthesiologist and Surgeon  Anesthesia Plan Comments:         Anesthesia Quick Evaluation

## 2011-07-06 NOTE — Progress Notes (Signed)
Orthopedic Tech Progress Note Patient Details:  JOAQUINA NISSEN 1970-02-18 [id number] Trapeze bar     Cammer, Mickie Bail 07/06/2011, 12:54 PM

## 2011-07-06 NOTE — Op Note (Signed)
OPERATIVE REPORT    DATE OF PROCEDURE:  07/06/2011       PREOPERATIVE DIAGNOSIS:  OSTEOARTHRITIS LEFT HIP                                                          POSTOPERATIVE DIAGNOSIS:  OSTEOARTHRITIS LEFT HIP                                                           PROCEDURE:  L total hip arthroplasty using a 52 mm DePuy Pinnacle  Cup, [employer], 10-degree polyethylene liner index superior  , a +0 36 mm ceramic head, a [id number] SROM stem, 20Bsm Cone   SURGEON: Jaliel Deavers J    ASSISTANT:   Mauricia Area, PA-C  (present throughout entire procedure and necessary for timely completion of the procedure)   ANESTHESIA: General BLOOD LOSS: 800 FLUID REPLACEMENT: 2000 crystalloid DRAINS: Foley Catheter URINE OUTPUT: 300cc COMPLICATIONS: none    INDICATIONS FOR PROCEDURE: A 41 y.o. year-old With  OSTEOARTHRITIS LEFT HIP   for 2 years, x-rays show bone-on-bone arthritic changes. Despite conservative measures with observation, anti-inflammatory medicine, narcotics, use of a cane, has severe unremitting pain and can ambulate only a few blocks before resting.  Patient desires elective L total hip arthroplasty to decrease pain and increase function. The risks, benefits, and alternatives were discussed at length including but not limited to the risks of infection, bleeding, nerve injury, stiffness, blood clots, the need for revision surgery, cardiopulmonary complications, among others, and they were willing to proceed.y have been discussed. Questions answered.     PROCEDURE IN DETAIL: The patient was identified by armband,  received preoperative IV antibiotics in the holding area at [facility], taken to the operating room , appropriate anesthetic monitors  were attached and general endotracheal anesthesia induced. Foley catheter was inserted. she was rolled into the R lateral decubitus position and fixed there with a Stulberg Mark II pelvic clamp and the L lower  extremity was then prepped and draped  in the usual sterile fashion from the ankle to the hemipelvis. A time-out  procedure was performed. The skin along the lateral hip and thigh  infiltrated with 10 mL of 0.5% Marcaine and epinephrine solution. We  then made a posterolateral approach to the hip. With a #10 blade, 25 cm  incision through skin and subcutaneous tissue down to the level of the  IT band. Small bleeders were identified and cauterized. IT band cut in  line with skin incision exposing the greater trochanter. A Cobra retractor was placed between the gluteus minimus and the superior hip joint capsule, and a spiked Cobra between the quadratus femoris and the inferior hip joint capsule. This isolated the short  external rotators and piriformis tendons. These were tagged with a #2 Ethibond  suture and cut off their insertion on the intertrochanteric crest. The posterior  capsule was then developed into an acetabular-based flap from Posterior Superior off of the acetabulum out over the femoral neck and back posterior inferior to the acetabular rim. This flap was tagged with two #2 Ethibond sutures and retracted protecting the  sciatic nerve. This exposed the arthritic femoral head and osteophytes. The hip was then flexed and internally rotated, dislocating the femoral head and a standard neck cut performed 1 fingerbreadth above the lesser trochanter.  A spiked Cobra was placed in the cotyloid notch and a Hohmann retractor was then used to lever the femur anteriorly off of the anterior pelvic column. A posterior-inferior wing retractor was placed at the junction of the acetabulum and the ischium completing the acetabular exposure.We then removed the peripheral osteophytes and labrum from the acetabulum. We then reamed the acetabulum up to 51 mm with basket reamers obtaining good coverage in all quadrants, irrigated out with normal  saline solution and hammered into place a 52 mm pinnacle cup in 45    degrees of abduction and about 20 degrees of anteversion. More  peripheral osteophytes removed and a trial 10-degree liner placed with the  index superior-posterior. The hip was then flexed and internally rotated exposing the  proximal femur, which was entered with the initiating reamer followed by  the axial reamers up to a 15.5 mm full depth and 16mm partial depth. We then conically reamed to 20B to the correct depth for a 42 base neck. The calcar was milled to 20Bsm. A trial cone and stem was inserted in the 25 degrees anteversion, with a +0 36mm trial head. Trial reduction was then performed and excellent stability was noted with at 90 of flexion with 70 of internal rotation and then full extension with maximal external rotation. The hip could not be dislocated in full extension. The knee could easily flex  to about 130 degrees. We also stretched the abductors at this point,  because of the preexisting adductor contractures. All trial components  were then removed. The acetabulum was irrigated out with normal saline  solution. A titanium [facility] was then screwed into place  followed by a 10-degree polyethylene liner index superior-posterior. On  the femoral side a 20Bsm ZTT1 cone was hammered into place, followed by a [id number] SROM stem in 25 degrees of anteversion. At this point, a +0 36 mm ceramic head was  hammered on the stem. The hip was reduced. We checked our stability  one more time and found to be excellent. The wound was once again  thoroughly irrigated out with normal saline solution pulse lavage. The  capsular flap and short external rotators were repaired back to the  intertrochanteric crest through drill holes with a #2 Ethibond suture.  The IT band was closed with running 1 Vicryl suture. The subcutaneous  tissue with 0 and 2-0 undyed Vicryl suture and the skin with running  interlocking 3-0 nylon suture. Dressing of Xeroform and Mepilex was  then applied. The  patient was then unclamped, rolled supine, awaken extubated and taken to recovery room without difficulty in stable condition.   Tarin Johndrow J 07/06/2011, 9:17 AM

## 2011-07-06 NOTE — Interval H&P Note (Signed)
History and Physical Interval Note:  07/06/2011 7:22 AM  Julia Monroe  has presented today for surgery, with the diagnosis of OSTEOARTHRITIS LEFT HIP  The various methods of treatment have been discussed with the patient and family. After consideration of risks, benefits and other options for treatment, the patient has consented to  Procedure(s) (LRB): TOTAL HIP ARTHROPLASTY (Left) as a surgical intervention .  The patients' history has been reviewed, patient examined, no change in status, stable for surgery.  I have reviewed the patients' chart and labs.  Questions were answered to the patient's satisfaction.     Nestor Lewandowsky

## 2011-07-06 NOTE — Transfer of Care (Signed)
Immediate Anesthesia Transfer of Care Note  Patient: Julia Monroe  Procedure(s) Performed: Procedure(s) (LRB): TOTAL HIP ARTHROPLASTY (Left)  Patient Location: PACU  Anesthesia Type: General  Level of Consciousness: awake, oriented, patient cooperative and responds to stimulation  Airway & Oxygen Therapy: Patient Spontanous Breathing and Patient connected to nasal cannula oxygen  Post-op Assessment: Report given to PACU RN, Post -op Vital signs reviewed and stable and Patient moving all extremities X 4  Post vital signs: Reviewed and stable  Complications: No apparent anesthesia complications

## 2011-07-06 NOTE — Anesthesia Postprocedure Evaluation (Signed)
  Anesthesia Post-op Note  Patient: Julia Monroe  Procedure(s) Performed: Procedure(s) (LRB): TOTAL HIP ARTHROPLASTY (Left)  Patient Location: PACU  Anesthesia Type: General  Level of Consciousness: awake, alert , oriented and patient cooperative  Airway and Oxygen Therapy: Patient Spontanous Breathing and Patient connected to nasal cannula oxygen  Post-op Pain: mild  Post-op Assessment: Post-op Vital signs reviewed, Patient's Cardiovascular Status Stable, Respiratory Function Stable, Patent Airway, No signs of Nausea or vomiting and Pain level controlled  Post-op Vital Signs: stable  Complications: No apparent anesthesia complications

## 2011-07-07 ENCOUNTER — Encounter (HOSPITAL_COMMUNITY): Payer: Self-pay | Admitting: Orthopedic Surgery

## 2011-07-07 LAB — BASIC METABOLIC PANEL
BUN: 7 mg/dL (ref 6–23)
Calcium: 8.4 mg/dL (ref 8.4–10.5)
Chloride: 100 mEq/L (ref 96–112)
Creatinine, Ser: 0.59 mg/dL (ref 0.50–1.10)
GFR calc Af Amer: >90 mL/min (ref >90)
GFR calc non Af Amer: >90 mL/min (ref >90)

## 2011-07-07 LAB — CBC
MCH: 28.2 pg (ref 26.0–34.0)
MCHC: 33.3 g/dL (ref 30.0–36.0)
MCV: 84.6 fL (ref 78.0–100.0)
Platelets: 207 10*3/uL (ref 150–400)
RDW: 14.7 % (ref 11.5–15.5)
WBC: 10.6 10*3/uL — ABNORMAL HIGH (ref 4.0–10.5)

## 2011-07-07 NOTE — Progress Notes (Signed)
Physical Therapy Treatment Note   07/07/11 1150  PT Visit Information  Last PT Received On 07/07/11  Assistance Needed +2  PT Time Calculation  PT Start Time 1150  PT Stop Time 1222  PT Time Calculation (min) 32 min  Subjective Data  Subjective Pt received sitting up in chair receiving pain medicine. Pt c/o 10/10 L hip pain with mvmt.  Precautions  Precautions Posterior Hip  Precaution Comments pt able to recall 3/3 precautions  Restrictions  LLE Weight Bearing WBAT  Cognition  Overall Cognitive Status Appears within functional limits for tasks assessed/performed  Arousal/Alertness Awake/alert  Orientation Level Appears intact for tasks assessed  Behavior During Session [facility] for tasks performed  Bed Mobility  Bed Mobility Not assessed (pt received sitting up in chair)  Transfers  Transfers Sit to Stand;Stand to Sit  Sit to Stand 4: Min assist;With upper extremity assist;With armrests;From chair/3-in-1  Stand to Sit 4: Min assist;To chair/3-in-1;With upper extremity assist  Details for Transfer Assistance increased time required, pt tearful due to pain but able to push through it. labored effort due to obesity and trying to adhere to post hip prec on L LE  Ambulation/Gait  Ambulation/Gait Assistance 3: Mod assist (for L LE advancement, transitioned to minA)  Ambulation Distance (Feet) 20 Feet  Assistive device Rolling walker  Ambulation/Gait Assistance Details max encouragement to clear L foot instead of inching foot across floor. Patient reported feeling better s/p ambulation  Gait Pattern Step-to pattern;Decreased step length - left;Decreased stance time - left;Antalgic  Gait velocity slow  General Gait Details improved from AM  Stairs No  Exercises  Exercises Total Joint  Total Joint Exercises  Ankle Circles/Pumps PROM;Both;10 reps;Seated  Quad Sets AROM;Left;5 reps;Seated  Gluteal Sets AROM;Both;10 reps;Seated  Short Arc Quad AROM;Left;10 reps;Seated  PT - End of  Session  Equipment Utilized During Treatment Gait belt  Activity Tolerance Patient limited by pain  Patient left in chair;with call bell/phone within reach;with family/visitor present  Nurse Communication Mobility status  PT - Assessment/Plan  Comments on Treatment Session Pt with improved ambulation endurance this date. educated extensively on importance of completing HEP and mvmt to limit soarness and stiffness. Pt appears to be in better spirits this PM however remains to cry in pain.  PT Plan Discharge plan remains appropriate;Frequency remains appropriate  PT Frequency 7X/week  Follow Up Recommendations Home health PT;Supervision/Assistance - 24 hour  Equipment Recommended Rolling walker with 5" wheels;3 in 1 bedside comode (bariatric/wide walker and 3n1 commode)  Acute Rehab PT Goals  PT Goal Formulation With patient  Time For Goal Achievement 07/14/11  Potential to Achieve Goals Good  PT Goal: Sit to Stand - Progress Progressing toward goal  PT Goal: Stand to Sit - Progress Progressing toward goal  PT Goal: Ambulate - Progress Progressing toward goal  PT Goal: Perform Home Exercise Program - Progress Progressing toward goal  PT General Charges  $$ ACUTE PT VISIT 1 Procedure  PT Treatments  $Gait Training 8-22 mins  $Therapeutic Exercise 8-22 mins     Pain: 10/10 L hip pain with mvmt  Lewis Shock, PT, DPT Pager #: [phone] Office #: [phone]

## 2011-07-07 NOTE — Evaluation (Signed)
Agree with evaluation and goals.  07/07/2011 Cincere Zorn M Kadrian Partch, PT, DPT [phone]   

## 2011-07-07 NOTE — Progress Notes (Signed)
Patient ID: Julia Monroe, female   DOB: 03/02/1970, 41 y.o.   MRN: [id number] PATIENT ID: Julia Monroe  MRN: [id number]  DOB/AGE:  April 02, 1970 / 41 y.o.  1 Day Post-Op Procedure(s) (LRB): TOTAL HIP ARTHROPLASTY (Left)    PROGRESS NOTE Subjective: Patient is alert, oriented,no Nausea, no Vomiting, yes passing gas, no Bowel Movement. Taking PO well. Denies SOB, Chest or Calf Pain. Using Incentive Spirometer, PAS in place. Ambulate WBAT Patient reports pain as 7 on 0-10 scale  .    Objective: Vital signs in last 24 hours: Filed Vitals:   07/06/11 1600 07/06/11 2305 07/07/11 0142 07/07/11 0617  BP:  114/93  132/65  Pulse:  94  86  Temp:  98.6 F (37 C) 99.2 F (37.3 C) 99.2 F (37.3 C)  TempSrc:      Resp: 16 20  18   Height:      Weight:      SpO2:  100%  98%      Intake/Output from previous day: I/O last 3 completed shifts: In: 3811.7 [P.O.:720; I.V.:3091.7] Out: 1700 [Urine:1100; Blood:600]   Intake/Output this shift:     LABORATORY DATA:  Basename 07/07/11 0655  WBC 10.6*  HGB 9.9*  HCT 29.7*  PLT 207  NA --  K --  CL --  CO2 --  BUN --  CREATININE --  GLUCOSE --  GLUCAP --  INR --  CALCIUM --    Examination: Neurologically intact ABD soft Neurovascular intact Sensation intact distally Intact pulses distally Dorsiflexion/Plantar flexion intact Incision: scant drainage No cellulitis present Compartment soft} XR AP&Lat of hip shows well placed\fixed THA  Assessment:   1 Day Post-Op Procedure(s) (LRB): TOTAL HIP ARTHROPLASTY (Left) ADDITIONAL DIAGNOSIS:  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

## 2011-07-07 NOTE — Progress Notes (Addendum)
Occupational Therapy Evaluation Patient Details Name: Julia Monroe MRN: [id number] DOB: [birth year] Today's Date: 07/07/2011 Time: 1320- 1350 30 min    OT Assessment / Plan / Recommendation Clinical Impression  41 yo s/p THA with posterior precautions. Pt will benefit from skilled OT services to max indenedence with ADL and functinal mobility for ADL to faciliatate D/C home with family.      OT Assessment  Patient needs continued OT Services    Follow Up Recommendations  Home health OT    Barriers to Discharge None    Equipment Recommendations  Rolling walker with 5" wheels;3 in 1 bedside comode    Recommendations for Other Services  none  Frequency  Min 2X/week    Precautions / Restrictions Precautions Precautions: Posterior Hip Precaution Booklet Issued: Yes (comment) Precaution Comments: Educated on 3/3 posterior hip precautions Restrictions Weight Bearing Restrictions: Yes LLE Weight Bearing: Weight bearing as tolerated   Pertinent Vitals/Pain 8. nsg aware    ADL  Eating/Feeding: Performed;Independent Where Assessed - Eating/Feeding: Chair Grooming: Performed;Supervision/safety Where Assessed - Grooming: Supported standing Upper Body Bathing: Simulated;Set up Where Assessed - Upper Body Bathing: Supported sitting Lower Body Bathing: Simulated;Maximal assistance Where Assessed - Lower Body Bathing: Supported sit to stand Where Assessed - Upper Body Dressing: Supported sitting Lower Body Dressing: Simulated;Maximal assistance Where Assessed - Lower Body Dressing: Sopported sit to stand Toilet Transfer: Performed;Minimal assistance Toilet Transfer Method: Sit to Barista: Extra wide bedside commode Toileting - Clothing Manipulation and Hygiene: Performed;Supervision/safety Where Assessed - Engineer, mining and Hygiene: Standing Equipment Used: Rolling walker;Gait belt Transfers/Ambulation Related to ADLs: min A from chair  with increased time ADL Comments: Pt without knowledge of AE and ADL/hip precautions    OT Diagnosis: Generalized weakness;Acute pain  OT Problem List: Decreased strength;Decreased range of motion;Decreased activity tolerance;Decreased knowledge of use of DME or AE;Decreased knowledge of precautions;Obesity;Pain OT Treatment Interventions: Self-care/ADL training;Therapeutic activities;Patient/family education;DME and/or AE instruction   OT Goals Acute Rehab OT Goals OT Goal Formulation: With patient Time For Goal Achievement: 07/07/11 Potential to Achieve Goals: Good ADL Goals Pt Will Perform Lower Body Bathing: with caregiver independent in assisting;with supervision;Sit to stand from bed;with adaptive equipment;Unsupported ADL Goal: Lower Body Bathing - Progress: Goal set today Pt Will Perform Lower Body Dressing: with supervision;with caregiver independent in assisting;Unsupported;with adaptive equipment ADL Goal: Lower Body Dressing - Progress: Goal set today Pt Will Transfer to Toilet: with modified independence;with caregiver independent in assisting;with DME;Ambulation;Extra wide 3-in-1 ADL Goal: Toilet Transfer - Progress: Goal set today Pt Will Perform Tub/Shower Transfer: with min assist;with caregiver independent in assisting;with DME;Ambulation;Tub transfer;Transfer tub bench ADL Goal: Tub/Shower Transfer - Progress: Goal set today Additional ADL Goal #1: Pt will demonstrate 3/3 THP durinmg ADL task independently. ADL Goal: Additional Goal #1 - Progress: Progressing toward goals  Visit Information  Last OT Received On: 07/07/11 Assistance Needed: +2    Subjective Data   I will need a wide toilet chair   Prior Functioning  Home Living Lives With: Family Available Help at Discharge: Family Type of Home: House Home Access: Stairs to enter Secretary/administrator of Steps: 1 Entrance Stairs-Rails: None Home Layout: One level Bathroom Shower/Tub: Investment banker, corporate: Standard Bathroom Accessibility: Yes How Accessible: Accessible via walker Home Adaptive Equipment: Straight cane Prior Function Level of Independence: Independent with assistive device(s) (Straight cane) Able to Take Stairs?: Yes Driving: Yes Vocation: Full time employment Comments: phlebotomist Communication Communication: No difficulties Dominant Hand: Right  Cognition  Overall Cognitive Status: Appears within functional limits for tasks assessed/performed Arousal/Alertness: Awake/alert Orientation Level: Appears intact for tasks assessed Behavior During Session: [facility] for tasks performed    Extremity/Trunk Assessment Right Upper Extremity Assessment RUE ROM/Strength/Tone: Within functional levels RUE Sensation: WFL - Light Touch RUE Coordination: WFL - gross/fine motor Left Upper Extremity Assessment LUE ROM/Strength/Tone: Within functional levels LUE Sensation: WFL - Light Touch LUE Coordination: WFL - gross/fine motor Right Lower Extremity Assessment RLE ROM/Strength/Tone: Within functional levels RLE Sensation: WFL - Light Touch RLE Coordination: WFL - gross/fine motor Left Lower Extremity Assessment LLE ROM/Strength/Tone: WFL for tasks assessed LLE Sensation: WFL - Light Touch LLE Coordination: WFL - gross/fine motor Trunk Assessment Trunk Assessment: Normal   Mobility Bed Mobility Bed Mobility: Supine to Sit Supine to Sit: 3: Mod assist Details for Bed Mobility Assistance: Assist to bring L LE to EOB and translate trunk anterior. Cues for sequence.  Transfers Transfers: Sit to Stand;Stand to Sit Sit to Stand: 4: Min assist;From bed;With upper extremity assist Stand to Sit: 4: Min assist;To chair/3-in-1;With armrests;With upper extremity assist Details for Transfer Assistance: Assist to translate body weight over BOS and for balance. Cues for sequence, safe hand placement, and L LE placement. Pt very fearful and nervous about  transitions.    Exercise Total Joint Exercises Ankle Circles/Pumps: AROM;Left;10 reps;Supine Quad Sets: AROM;Left;10 reps;Supine Gluteal Sets: AROM;Both;10 reps;Seated Short Arc Quad: AROM;Left;10 reps;Seated Heel Slides: AAROM;Left;10 reps;Supine  Balance Balance Balance Assessed: No  End of Session OT - End of Session Equipment Utilized During Treatment: Gait belt Activity Tolerance: Patient limited by pain Patient left: in bed;with call bell/phone within reach;with family/visitor present Nurse Communication: Mobility status;Precautions   Pinkey Mcjunkin,HILLARY 07/07/2011, 3:41 PM [facility], OTR/L  [phone] 07/07/2011

## 2011-07-07 NOTE — Evaluation (Signed)
Physical Therapy Evaluation Patient Details Name: Julia Monroe MRN: [id number] DOB: 1970/08/22 Today's Date: 07/07/2011 Time: 6578-4696 PT Time Calculation (min): 35 min  PT Assessment / Plan / Recommendation Clinical Impression  Pt is a 41 y.o. female admitted s/p L THA along with PT problem list below. Pt would benefit from continued PT services to maximize independence to facilitate a safe D/C home with HHPT.     PT Assessment  Patient needs continued PT services    Follow Up Recommendations  Home health PT    Barriers to Discharge None (Daughter and mother available to help. )      lEquipment Recommendations  Rolling walker with 5" wheels;3 in 1 bedside comode    Recommendations for Other Services     Frequency 7X/week    Precautions / Restrictions Precautions Precautions: Posterior Hip Precaution Booklet Issued: Yes (comment) Precaution Comments: Educated on 3/3 posterior hip precautions Restrictions Weight Bearing Restrictions: Yes LLE Weight Bearing: Weight bearing as tolerated   Pertinent Vitals/Pain Pt reports 8/10 pain at L hip. Pt repositioned.       Mobility  Bed Mobility Bed Mobility: Supine to Sit Supine to Sit: 3: Mod assist Details for Bed Mobility Assistance: Assist to bring L LE to EOB and translate trunk anterior. Cues for sequence.  Transfers Transfers: Sit to Stand;Stand to Sit Sit to Stand: 4: Min assist;From bed;With upper extremity assist Stand to Sit: 4: Min assist;To chair/3-in-1;With armrests;With upper extremity assist Details for Transfer Assistance: Assist to translate body weight over BOS and for balance. Cues for sequence, safe hand placement, and L LE placement. Pt very fearful and nervous about transitions.  Ambulation/Gait Ambulation/Gait Assistance: 4: Min assist Ambulation Distance (Feet): 20 Feet Assistive device: Rolling walker Ambulation/Gait Assistance Details: max encouragement to clear L foot instead of inching foot  across floor. Patient reported feeling better s/p ambulation Gait Pattern: Step-to pattern;Decreased step length - left;Decreased stance time - left Gait velocity: slow General Gait Details: improved from AM Stairs: No Wheelchair Mobility Wheelchair Mobility: No    Exercises Total Joint Exercises Ankle Circles/Pumps: AROM;Left;10 reps;Supine Quad Sets: AROM;Left;10 reps;Supine Heel Slides: AAROM;Left;10 reps;Supine   PT Diagnosis: Abnormality of gait;Acute pain  PT Problem List: Decreased strength;Decreased range of motion;Decreased activity tolerance;Decreased balance;Decreased mobility;Decreased knowledge of use of DME;Decreased knowledge of precautions;Pain PT Treatment Interventions: DME instruction;Gait training;Stair training;Functional mobility training;Therapeutic activities;Therapeutic exercise;Patient/family education   PT Goals Acute Rehab PT Goals PT Goal Formulation: With patient Time For Goal Achievement: 07/14/11 Potential to Achieve Goals: Good Pt will go Supine/Side to Sit: with modified independence PT Goal: Supine/Side to Sit - Progress: Goal set today Pt will go Sit to Supine/Side: with modified independence PT Goal: Sit to Supine/Side - Progress: Goal set today Pt will go Sit to Stand: with modified independence PT Goal: Sit to Stand - Progress: Goal set today Pt will go Stand to Sit: with modified independence PT Goal: Stand to Sit - Progress: Goal set today Pt will Ambulate: >150 feet;with modified independence;with least restrictive assistive device PT Goal: Ambulate - Progress: Goal set today Pt will Go Up / Down Stairs: 1-2 stairs;with modified independence;with least restrictive assistive device PT Goal: Up/Down Stairs - Progress: Goal set today Pt will Perform Home Exercise Program: Independently PT Goal: Perform Home Exercise Program - Progress: Goal set today  Visit Information  Last PT Received On: 07/07/11 Assistance Needed: +2    Subjective  Data  Subjective: "I'm in a lot of pain." Patient Stated Goal: Go home.  Prior Functioning  Home Living Lives With: Family Available Help at Discharge: Family Type of Home: House Home Access: Stairs to enter Secretary/administrator of Steps: 1 Entrance Stairs-Rails: None Home Layout: One level Bathroom Shower/Tub: Forensic scientist: Standard Bathroom Accessibility: Yes Home Adaptive Equipment: Straight cane Prior Function Level of Independence: Independent with assistive device(s) (Straight cane) Able to Take Stairs?: Yes Driving: Yes Vocation: Full time employment Communication Communication: No difficulties Dominant Hand: Right    Cognition  Overall Cognitive Status: Appears within functional limits for tasks assessed/performed Arousal/Alertness: Awake/alert Orientation Level: Appears intact for tasks assessed Behavior During Session: [facility] for tasks performed    Extremity/Trunk Assessment Right Upper Extremity Assessment RUE ROM/Strength/Tone: Within functional levels RUE Sensation: WFL - Light Touch RUE Coordination: WFL - gross/fine motor Left Upper Extremity Assessment LUE ROM/Strength/Tone: Within functional levels LUE Sensation: WFL - Light Touch LUE Coordination: WFL - gross/fine motor Right Lower Extremity Assessment RLE ROM/Strength/Tone: Within functional levels RLE Sensation: WFL - Light Touch RLE Coordination: WFL - gross/fine motor Left Lower Extremity Assessment LLE ROM/Strength/Tone: WFL for tasks assessed LLE Sensation: WFL - Light Touch LLE Coordination: WFL - gross/fine motor Trunk Assessment Trunk Assessment: Normal   Balance Balance Balance Assessed: No  End of Session PT - End of Session Equipment Utilized During Treatment: Gait belt Activity Tolerance: Patient tolerated treatment well;Patient limited by pain;Patient limited by fatigue Patient left: in chair;with call bell/phone within reach;with family/visitor  present Nurse Communication: Mobility status   Oretha Ellis 07/07/2011, 1:45 PM

## 2011-07-07 NOTE — Progress Notes (Signed)
UR COMPLETED  

## 2011-07-08 LAB — CBC
MCHC: 33.2 g/dL (ref 30.0–36.0)
Platelets: 196 10*3/uL (ref 150–400)
RDW: 14.5 % (ref 11.5–15.5)
WBC: 14.3 10*3/uL — ABNORMAL HIGH (ref 4.0–10.5)

## 2011-07-08 MED ORDER — OXYCODONE-ACETAMINOPHEN 5-325 MG PO TABS
1.0000 | ORAL_TABLET | ORAL | Status: AC | PRN
Start: 2011-07-08 — End: 2011-07-18

## 2011-07-08 MED ORDER — METHOCARBAMOL 500 MG PO TABS: 500.0000 mg | ORAL_TABLET | Freq: Four times a day (QID) | ORAL | Status: AC | PRN

## 2011-07-08 MED ORDER — ASPIRIN 325 MG PO TBEC: 325.0000 mg | DELAYED_RELEASE_TABLET | Freq: Two times a day (BID) | ORAL | Status: AC

## 2011-07-08 NOTE — Progress Notes (Signed)
Physical Therapy Treatment Patient Details Name: Julia Monroe MRN: [id number] DOB: 01-14-1971 Today's Date: 07/08/2011 Time: 4098-1191 PT Time Calculation (min): 14 min  PT Assessment / Plan / Recommendation Comments on Treatment Session  Pt admitted s/p left THA and continues to increase ambulation distance with each session.  Pt is very motivated although feels she is not progressing even with increased encouragement.      Follow Up Recommendations  Home health PT    Barriers to Discharge        Equipment Recommendations  Rolling walker with 5" wheels;3 in 1 bedside comode;Tub/shower bench    Recommendations for Other Services    Frequency 7X/week   Plan Discharge plan remains appropriate;Frequency remains appropriate    Precautions / Restrictions Precautions Precautions: Posterior Hip Precaution Booklet Issued: No Precaution Comments: Able to recall 3/3 posterior hip precautions. Restrictions Weight Bearing Restrictions: Yes LLE Weight Bearing: Weight bearing as tolerated   Pertinent Vitals/Pain 8/10 in left hip.  Pt repositioned.    Mobility  Bed Mobility Bed Mobility: Not assessed Transfers Transfers: Sit to Stand;Stand to Sit Sit to Stand: 4: Min assist;With upper extremity assist;From chair/3-in-1 Stand to Sit: 4: Min assist;With upper extremity assist;To chair/3-in-1 Details for Transfer Assistance: Assist to translate trunk anterior over BOS as well as slow descent.  Cues for safe hand/left LE placement. Ambulation/Gait Ambulation/Gait Assistance: 4: Min assist Ambulation Distance (Feet): 50 Feet Assistive device: Rolling walker Ambulation/Gait Assistance Details: Assist for balance with cues for initial contact on left heel as well as step-through sequence while increasing weight bearing on left LE. Gait Pattern: Step-to pattern;Decreased step length - left;Decreased stance time - left Stairs: No Wheelchair Mobility Wheelchair Mobility: No    Exercises      PT Diagnosis:    PT Problem List:   PT Treatment Interventions:     PT Goals Acute Rehab PT Goals PT Goal Formulation: With patient Time For Goal Achievement: 07/14/11 Potential to Achieve Goals: Good PT Goal: Sit to Stand - Progress: Progressing toward goal PT Goal: Stand to Sit - Progress: Progressing toward goal PT Goal: Ambulate - Progress: Progressing toward goal  Visit Information  Last PT Received On: 07/08/11 Assistance Needed: +1    Subjective Data  Subjective: "I am just worried I am not progressing." Patient Stated Goal: Go home.    Cognition  Overall Cognitive Status: Appears within functional limits for tasks assessed/performed Arousal/Alertness: Awake/alert Orientation Level: Appears intact for tasks assessed Behavior During Session: [facility] for tasks performed    Balance  Balance Balance Assessed: No  End of Session PT - End of Session Equipment Utilized During Treatment: Gait belt Activity Tolerance: Patient tolerated treatment well;Patient limited by pain Patient left: in chair;with call bell/phone within reach;with family/visitor present Nurse Communication: Mobility status    Cephus Shelling 07/08/2011, 2:52 PM  07/08/2011 Cephus Shelling, PT, DPT [phone]

## 2011-07-08 NOTE — Progress Notes (Signed)
Physical Therapy Treatment Patient Details Name: Julia Monroe MRN: [id number] DOB: 1970-07-29 Today's Date: 07/08/2011 Time: 7829-5621 PT Time Calculation (min): 23 min  PT Assessment / Plan / Recommendation Comments on Treatment Session  Pt admitted s/p left THA and continues to improve.  Pt able to tolerate increased ambulation distance this treatment session.    Follow Up Recommendations  Home health PT    Barriers to Discharge        Equipment Recommendations  Rolling walker with 5" wheels;3 in 1 bedside comode;Tub/shower bench    Recommendations for Other Services    Frequency 7X/week   Plan Discharge plan remains appropriate;Frequency remains appropriate    Precautions / Restrictions Precautions Precautions: Posterior Hip Precaution Booklet Issued: No Precaution Comments: Able to recall 3/3 posterior hip precautions. Restrictions Weight Bearing Restrictions: Yes LLE Weight Bearing: Weight bearing as tolerated   Pertinent Vitals/Pain Unable to rate pain in left hip.  Pt repositioned.    Mobility  Bed Mobility Bed Mobility: Sit to Supine Supine to Sit: 3: Mod assist Details for Bed Mobility Assistance: Assist for left LE due to pain with cues for sequence.  Assist also to trunk to translate anterior over BOS. Transfers Transfers: Sit to Stand;Stand to Sit Sit to Stand: 4: Min assist;With upper extremity assist;From bed Stand to Sit: 4: Min assist;With upper extremity assist;To chair/3-in-1 Details for Transfer Assistance: Assist for balance and due to pain with cues for safest hand/left LE placement to maintain posterior hip precautions. Ambulation/Gait Ambulation/Gait Assistance: 4: Min assist Ambulation Distance (Feet): 35 Feet Assistive device: Rolling walker Ambulation/Gait Assistance Details: Assist to off weight left LE due to pain with constant cues for tall posture as well as increased weight bearing on left LE and initial contact on left heel.  Pt with  tendency to scoot left toes along floor eliminating swing phase or having initial contact on left toes. Gait Pattern: Step-to pattern;Decreased step length - left;Decreased stance time - left Gait velocity: slow Stairs: No Wheelchair Mobility Wheelchair Mobility: No    Exercises     PT Diagnosis:    PT Problem List:   PT Treatment Interventions:     PT Goals Acute Rehab PT Goals PT Goal Formulation: With patient Time For Goal Achievement: 07/14/11 Potential to Achieve Goals: Good PT Goal: Supine/Side to Sit - Progress: Progressing toward goal PT Goal: Sit to Stand - Progress: Progressing toward goal PT Goal: Stand to Sit - Progress: Progressing toward goal PT Goal: Ambulate - Progress: Progressing toward goal  Visit Information  Last PT Received On: 07/08/11 Assistance Needed: +1    Subjective Data  Subjective: "Doing better than yesterday." Patient Stated Goal: Go home.    Cognition  Overall Cognitive Status: Appears within functional limits for tasks assessed/performed Arousal/Alertness: Awake/alert Orientation Level: Appears intact for tasks assessed Behavior During Session: [facility] for tasks performed    Balance  Balance Balance Assessed: No  End of Session PT - End of Session Equipment Utilized During Treatment: Gait belt Activity Tolerance: Patient tolerated treatment well;Patient limited by pain Patient left: in chair;with call bell/phone within reach;with family/visitor present Nurse Communication: Mobility status    Cephus Shelling 07/08/2011, 10:51 AM  07/08/2011 Cephus Shelling, PT, DPT [phone]

## 2011-07-08 NOTE — Progress Notes (Signed)
PATIENT ID: Julia Monroe  MRN: [id number]  DOB/AGE:  Jun 30, 1970 / 41 y.o.  2 Days Post-Op Procedure(s) (LRB): TOTAL HIP ARTHROPLASTY (Left)    PROGRESS NOTE Subjective: Patient is alert, oriented,no Nausea, no Vomiting, yes passing gas, no Bowel Movement. Taking PO well. Denies SOB, Chest or Calf Pain. Using Incentive Spirometer, PAS in place. Ambulating slowly with PT. Patient reports pain as 7 on 0-10 scale  .    Objective: Vital signs in last 24 hours: Filed Vitals:   07/07/11 0830 07/07/11 1300 07/07/11 2302 07/08/11 0633  BP:  114/79 142/78 124/78  Pulse:  106 110 100  Temp:  99.6 F (37.6 C) 99.6 F (37.6 C) 100 F (37.8 C)  TempSrc:    Oral  Resp:  16 16 18   Height:      Weight:      SpO2: 100% 97% 98% 98%      Intake/Output from previous day: I/O last 3 completed shifts: In: 2511.7 [P.O.:1720; I.V.:791.7] Out: 1600 [Urine:1600]   Intake/Output this shift:     LABORATORY DATA:  Basename 07/08/11 0710 07/07/11 0655  WBC 14.3* 10.6*  HGB 9.1* 9.9*  HCT 27.4* 29.7*  PLT 196 207  NA -- 133*  K -- 3.9  CL -- 100  CO2 -- 24  BUN -- 7  CREATININE -- 0.59  GLUCOSE -- 125*  GLUCAP -- --  INR -- --  CALCIUM -- 8.4    Examination: Neurologically intact ABD soft Neurovascular intact Sensation intact distally Intact pulses distally Dorsiflexion/Plantar flexion intact Incision: scant drainage} XR AP&Lat of hip shows well placed\fixed THA  Assessment:   2 Days Post-Op Procedure(s) (LRB): TOTAL HIP ARTHROPLASTY (Left) ADDITIONAL DIAGNOSIS:  none  Plan: PT/OT WBAT, THA  posterior precautions  DVT Prophylaxis: SCDx72 hrs, ASA 325 mg BID x 2 weeks  DISCHARGE PLAN: Home  DISCHARGE NEEDS: HHPT, HHRN, Walker and 3-in-1 comode seat

## 2011-07-09 LAB — CBC
HCT: 24.4 % — ABNORMAL LOW (ref 36.0–46.0)
Hemoglobin: 8.1 g/dL — ABNORMAL LOW (ref 12.0–15.0)
MCH: 27.7 pg (ref 26.0–34.0)
MCHC: 33.2 g/dL (ref 30.0–36.0)
MCV: 83.6 fL (ref 78.0–100.0)
RDW: 14.5 % (ref 11.5–15.5)

## 2011-07-09 NOTE — Progress Notes (Signed)
Physical Therapy Treatment Patient Details Name: Julia Monroe MRN: [id number] DOB: 1970-10-17 Today's Date: 07/09/2011 Time: 1415-1430 PT Time Calculation (min): 15 min  PT Assessment / Plan / Recommendation Comments on Treatment Session  Pt admitted s/p left THA and is more lethargic this pm.  Pt almost through receiving unit of blood and motivated to work with PT.  Pt, however, limited by fatigue able to only perform therapeutic exercises.    Follow Up Recommendations  Home health PT    Barriers to Discharge        Equipment Recommendations  Rolling walker with 5" wheels;3 in 1 bedside comode;Tub/shower bench    Recommendations for Other Services    Frequency 7X/week   Plan Discharge plan remains appropriate;Frequency remains appropriate    Precautions / Restrictions Precautions Precautions: Posterior Hip Precaution Booklet Issued: No Precaution Comments: Able to recall 2/3 posterior hip precautions.  Cues to recall "no turning your toes inward." Restrictions Weight Bearing Restrictions: Yes LLE Weight Bearing: Weight bearing as tolerated   Pertinent Vitals/Pain 3/10 in left hip.  Pt repositioned.    Mobility  Bed Mobility Bed Mobility: Not assessed Transfers Transfers: Not assessed Ambulation/Gait Ambulation/Gait Assistance: Not tested (comment) Stairs: No Wheelchair Mobility Wheelchair Mobility: No    Exercises Total Joint Exercises Ankle Circles/Pumps: AROM;Left;10 reps;Supine Quad Sets: AROM;Left;10 reps;Supine Short Arc Quad: AAROM;Left;10 reps;Supine Heel Slides: AAROM;Left;10 reps;Supine Hip ABduction/ADduction: AAROM;Left;10 reps;Supine   PT Diagnosis:    PT Problem List:   PT Treatment Interventions:     PT Goals Acute Rehab PT Goals PT Goal Formulation: With patient Time For Goal Achievement: 07/14/11 Potential to Achieve Goals: Good PT Goal: Perform Home Exercise Program - Progress: Progressing toward goal  Visit Information  Last PT  Received On: 07/09/11 Assistance Needed: +1    Subjective Data  Subjective: "I'm really tired now." Patient Stated Goal: Go home.    Cognition  Overall Cognitive Status: Appears within functional limits for tasks assessed/performed Arousal/Alertness: Lethargic Orientation Level: Appears intact for tasks assessed Behavior During Session: Lethargic    Balance  Balance Balance Assessed: No  End of Session PT - End of Session Activity Tolerance: Patient limited by fatigue Patient left: in bed;with call bell/phone within reach;with family/visitor present Nurse Communication: Mobility status    Cephus Shelling 07/09/2011, 2:27 PM  07/09/2011 Cephus Shelling, PT, DPT [phone]

## 2011-07-09 NOTE — Progress Notes (Signed)
As I was priming tubing with saline, before blood had reached pt, the IV was leaking.  IV team notified and IV was restarted.  Blood actually started at 1135.

## 2011-07-09 NOTE — Progress Notes (Signed)
Physical Therapy Treatment Patient Details Name: Julia Monroe MRN: [id number] DOB: 15-Mar-1970 Today's Date: 07/09/2011 Time: 7829-5621 PT Time Calculation (min): 26 min  PT Assessment / Plan / Recommendation Comments on Treatment Session  Pt admitted s/p left THA and is limited by fatigue this am.  Pt able to tolerate therapeutic exercises this am as well as ambulation.  Pt remains motivated.    Follow Up Recommendations  Home health PT    Barriers to Discharge        Equipment Recommendations  Rolling walker with 5" wheels;3 in 1 bedside comode;Tub/shower bench    Recommendations for Other Services    Frequency 7X/week   Plan Discharge plan remains appropriate;Frequency remains appropriate    Precautions / Restrictions Precautions Precautions: Posterior Hip Precaution Booklet Issued: No Precaution Comments: Able to recall 3/3 posterior hip precautions. Restrictions Weight Bearing Restrictions: Yes LLE Weight Bearing: Weight bearing as tolerated   Pertinent Vitals/Pain 1/10 in left hip.  Pt repositioned with ice applied.    Mobility  Bed Mobility Bed Mobility: Not assessed Transfers Transfers: Sit to Stand;Stand to Sit Sit to Stand: 4: Min guard;With upper extremity assist;From chair/3-in-1 Stand to Sit: 5: Supervision;With upper extremity assist;To chair/3-in-1 Details for Transfer Assistance: Guarding for balance with cues for safe hand/left LE placement.   Ambulation/Gait Ambulation/Gait Assistance: 4: Min guard Ambulation Distance (Feet): 58 Feet Assistive device: Rolling walker Ambulation/Gait Assistance Details: Guarding for balance with cues for tall posture and correct sequence including increased left hip/knee flexion in order to advance left LE and allow for initial contact on left heel.   Gait Pattern: Step-to pattern;Decreased step length - left;Decreased stance time - left Stairs: No Wheelchair Mobility Wheelchair Mobility: No    Exercises Total Joint  Exercises Ankle Circles/Pumps: AROM;Left;10 reps;Supine Quad Sets: AROM;Left;10 reps;Supine Short Arc Quad: AROM;Left;10 reps;Supine Heel Slides: AAROM;Left;10 reps;Supine Hip ABduction/ADduction: AAROM;Left;10 reps;Supine Long Arc Quad: AAROM;Left;10 reps;Seated   PT Diagnosis:    PT Problem List:   PT Treatment Interventions:     PT Goals Acute Rehab PT Goals PT Goal Formulation: With patient Time For Goal Achievement: 07/14/11 Potential to Achieve Goals: Good PT Goal: Sit to Stand - Progress: Progressing toward goal PT Goal: Stand to Sit - Progress: Progressing toward goal PT Goal: Ambulate - Progress: Progressing toward goal PT Goal: Perform Home Exercise Program - Progress: Progressing toward goal  Visit Information  Last PT Received On: 07/09/11 Assistance Needed: +1    Subjective Data  Subjective: "I just feel really weak." Patient Stated Goal: Go home.    Cognition  Overall Cognitive Status: Appears within functional limits for tasks assessed/performed Arousal/Alertness: Awake/alert Orientation Level: Appears intact for tasks assessed Behavior During Session: [facility] for tasks performed    Balance  Balance Balance Assessed: No  End of Session PT - End of Session Equipment Utilized During Treatment: Gait belt Activity Tolerance: Patient tolerated treatment well;Patient limited by fatigue Patient left: in chair;with call bell/phone within reach;with family/visitor present Nurse Communication: Mobility status    Cephus Shelling 07/09/2011, 9:39 AM  07/09/2011 Cephus Shelling, PT, DPT [phone]

## 2011-07-09 NOTE — Progress Notes (Signed)
PATIENT ID: Julia Monroe  MRN: [id number]  DOB/AGE:  39/09/1970 / 39 y.o.  3 Days Post-Op Procedure(s) (LRB): TOTAL HIP ARTHROPLASTY (Left)    PROGRESS NOTE Subjective: Patient is alert, oriented,no Nausea, no Vomiting, yes passing gas, no Bowel Movement. Taking PO well. Denies Chest or Calf Pain. Using Incentive Spirometer, PAS in place. Complains of fatigue. Ambulating slowly with PT.  Patient reports pain as 7 on 0-10 scale  .    Objective: Vital signs in last 24 hours: Filed Vitals:   07/08/11 2153 07/09/11 0000 07/09/11 0400 07/09/11 0515  BP: 111/69   123/63  Pulse: 107   93  Temp: 100.9 F (38.3 C)   99.3 F (37.4 C)  TempSrc:      Resp: 18 18 18 18   Height:      Weight:      SpO2: 100% 100% 99% 99%      Intake/Output from previous day: I/O last 3 completed shifts: In: 1880 [P.O.:1880] Out: 600 [Urine:600]   Intake/Output this shift:     LABORATORY DATA:  Basename 07/09/11 0515 07/08/11 0710 07/07/11 0655  WBC 12.9* 14.3* --  HGB 8.1* 9.1* --  HCT 24.4* 27.4* --  PLT 185 196 --  NA -- -- 133*  K -- -- 3.9  CL -- -- 100  CO2 -- -- 24  BUN -- -- 7  CREATININE -- -- 0.59  GLUCOSE -- -- 125*  GLUCAP -- -- --  INR -- -- --  CALCIUM -- -- 8.4    Examination: Neurologically intact ABD soft Neurovascular intact Sensation intact distally Intact pulses distally Dorsiflexion/Plantar flexion intact Incision: scant drainage} XR AP&Lat of hip shows well placed\fixed THA  Assessment:   3 Days Post-Op Procedure(s) (LRB): TOTAL HIP ARTHROPLASTY (Left) ADDITIONAL DIAGNOSIS:  Acute Blood Loss Anemia  Plan: PT/OT WBAT, THA  posterior precautions  DVT Prophylaxis: SCDx72 hrs, ASA 325 mg BID x 2 weeks  Will transfuse 1 unit PRBC today.    DISCHARGE PLAN: Home today  DISCHARGE NEEDS: HHPT, HHRN, CPM, Walker and 3-in-1 comode seat

## 2011-07-10 DIAGNOSIS — M1611 Unilateral primary osteoarthritis, right hip: Secondary | ICD-10-CM | POA: Diagnosis present

## 2011-07-10 LAB — TYPE AND SCREEN: ABO/RH(D): B POS

## 2011-07-10 LAB — CBC
HCT: 25.7 % — ABNORMAL LOW (ref 36.0–46.0)
MCH: 27.5 pg (ref 26.0–34.0)
MCHC: 33.1 g/dL (ref 30.0–36.0)
MCV: 83.2 fL (ref 78.0–100.0)
RDW: 15.2 % (ref 11.5–15.5)

## 2011-07-10 NOTE — Progress Notes (Signed)
Physical Therapy Treatment Patient Details Name: Julia Monroe MRN: [id number] DOB: [birth year] Today's Date: 07/10/2011 Time: 4098-1191 PT Time Calculation (min): 15 min  PT Assessment / Plan / Recommendation Comments on Treatment Session  Pt admitted s/p left THA and was able to tolerate increased ambulation distance with increased independence today as well as stair negotation.  Pt ready for safe d/c home once medically cleared by MD.    Follow Up Recommendations  Home health PT    Barriers to Discharge        Equipment Recommendations  Rolling walker with 5" wheels;3 in 1 bedside comode;Tub/shower bench    Recommendations for Other Services    Frequency 7X/week   Plan Discharge plan remains appropriate;Frequency remains appropriate    Precautions / Restrictions Precautions Precautions: Posterior Hip Precaution Booklet Issued: No Precaution Comments: Able to recall 2/3 posterior hip precautions.  Cues to recall "no turning your toes inward." Restrictions Weight Bearing Restrictions: Yes LLE Weight Bearing: Weight bearing as tolerated   Pertinent Vitals/Pain 2/10 in left hip.  Pt repositioned.    Mobility  Bed Mobility Bed Mobility: Not assessed Transfers Transfers: Sit to Stand;Stand to Sit Sit to Stand: 5: Supervision;With upper extremity assist;From bed Stand to Sit: 5: Supervision;With upper extremity assist;To chair/3-in-1 Details for Transfer Assistance: Verbal cues for safest hand/left LE placement. Ambulation/Gait Ambulation/Gait Assistance: 5: Supervision Ambulation Distance (Feet): 80 Feet Assistive device: Rolling walker Ambulation/Gait Assistance Details: Verbal cues for safe sequenec including tall posture, step-through sequence, and initial contact on left heel. Gait Pattern: Step-through pattern;Decreased step length - left;Decreased stance time - left;Trunk flexed Stairs: Yes Stairs Assistance: 4: Min guard (Guarding for balance with cues for  sequence.) Stair Management Technique: Step to pattern;Backwards;With walker Number of Stairs: 1  Wheelchair Mobility Wheelchair Mobility: No    Exercises     PT Diagnosis:    PT Problem List:   PT Treatment Interventions:     PT Goals Acute Rehab PT Goals PT Goal Formulation: With patient Time For Goal Achievement: 07/14/11 Potential to Achieve Goals: Good PT Goal: Sit to Stand - Progress: Progressing toward goal PT Goal: Stand to Sit - Progress: Progressing toward goal PT Goal: Ambulate - Progress: Progressing toward goal PT Goal: Up/Down Stairs - Progress: Progressing toward goal  Visit Information  Last PT Received On: 07/10/11 Assistance Needed: +1    Subjective Data  Subjective: "I'm ready to go home." Patient Stated Goal: Go home.    Cognition  Overall Cognitive Status: Appears within functional limits for tasks assessed/performed Arousal/Alertness: Awake/alert Orientation Level: Appears intact for tasks assessed Behavior During Session: [facility] for tasks performed    Balance  Balance Balance Assessed: No  End of Session PT - End of Session Equipment Utilized During Treatment: Gait belt Activity Tolerance: Patient tolerated treatment well Patient left: in chair;with call bell/phone within reach;with family/visitor present Nurse Communication: Mobility status    Cephus Shelling 07/10/2011, 8:29 AM  07/10/2011 Cephus Shelling, PT, DPT [phone]

## 2011-07-10 NOTE — Discharge Summary (Signed)
Patient ID: Julia Monroe MRN: [id number] DOB/AGE: [birth year] 41 y.o.  Admit date: 07/06/2011 Discharge date: 07/10/2011  Admission Diagnoses:  Active Problems:  Osteoarthritis of left hip   Discharge Diagnoses:  Same  Past Medical History  Diagnosis Date  . GERD (gastroesophageal reflux disease)     occ  . Headache     hx migraines  . Arthritis     Surgeries: Procedure(s): TOTAL HIP ARTHROPLASTY on 07/06/2011   Consultants:    Discharged Condition: Improved  Hospital Course: Julia Monroe is an 41 y.o. female who was admitted 07/06/2011 for operative treatment of<principal problem not specified>. Patient has severe unremitting pain that affects sleep, daily activities, and work/hobbies. After pre-op clearance the patient was taken to the operating room on 07/06/2011 and underwent  Procedure(s): TOTAL HIP ARTHROPLASTY.    Patient was given perioperative antibiotics: Anti-infectives     Start     Dose/Rate Route Frequency Ordered Stop   07/05/11 1220   ceFAZolin (ANCEF) IVPB 2 g/50 mL premix        2 g 100 mL/hr over 30 Minutes Intravenous 60 min pre-op 07/05/11 1220 07/06/11 0731           Patient was given sequential compression devices, early ambulation, and chemoprophylaxis to prevent DVT.  Patient benefited maximally from hospital stay and there were no complications.    Recent vital signs: Patient Vitals for the past 24 hrs:  BP Temp Temp src Pulse Resp SpO2  07/10/11 0530 135/75 mmHg 98.7 F (37.1 C) Oral 97  18  97 %  07/09/11 2214 143/66 mmHg 100 F (37.8 C) - 89  18  100 %  07/09/11 1445 132/50 mmHg 99.1 F (37.3 C) - 95  22  -  07/09/11 1350 130/62 mmHg 98.7 F (37.1 C) - 92  18  -  07/09/11 1250 133/61 mmHg 98.7 F (37.1 C) - 93  20  -  07/09/11 1150 126/63 mmHg 97.7 F (36.5 C) - 90  18  -  07/09/11 1135 131/47 mmHg 97.8 F (36.6 C) - 91  18  -  07/09/11 1100 123/50 mmHg 98.5 F (36.9 C) - 93  18  -     Recent laboratory studies:    Basename 07/09/11 0515 07/08/11 0710  WBC 12.9* 14.3*  HGB 8.1* 9.1*  HCT 24.4* 27.4*  PLT 185 196  NA -- --  K -- --  CL -- --  CO2 -- --  BUN -- --  CREATININE -- --  GLUCOSE -- --  INR -- --  CALCIUM -- --     Discharge Medications:   Medication List  As of 07/10/2011  8:15 AM   STOP taking these medications         acetaminophen-codeine 300-30 MG per tablet      traMADol 50 MG tablet      TYLENOL EX ST ARTHRITIS PAIN 500 MG tablet         TAKE these medications         aspirin 325 MG EC tablet   Take 1 tablet (325 mg total) by mouth 2 (two) times daily.      CAMBIA 50 MG Pack   Generic drug: Diclofenac Potassium   Take 50 mg by mouth as needed. For migraines      methocarbamol 500 MG tablet   Commonly known as: ROBAXIN   Take 1 tablet (500 mg total) by mouth every 6 (six) hours as needed.  multivitamin with minerals Tabs   Take 1 tablet by mouth daily.      naproxen sodium 220 MG tablet   Commonly known as: ANAPROX   Take 220 mg by mouth 2 (two) times daily as needed. For pain      oxyCODONE-acetaminophen 5-325 MG per tablet   Commonly known as: PERCOCET   Take 1-2 tablets by mouth every 4 (four) hours as needed.            Diagnostic Studies: Dg Chest 2 View  06/29/2011  *RADIOLOGY REPORT*  Clinical Data: Preop  CHEST - 2 VIEW  Comparison: None.  Findings: The heart, mediastinal, and hilar contours are normal. The pulmonary vascularity is normal.  The lungs are clear.  Lung volumes slightly low.  No pleural effusion or pneumothorax. Trachea midline. No acute bony abnormality identified.  Mild degenerative changes of thoracic spine.  IMPRESSION: No acute cardiopulmonary disease.  Original Report Authenticated By: Britta Mccreedy, MonroeD.   Dg Pelvis Portable  07/06/2011  *RADIOLOGY REPORT*  Clinical Data: Left total hip replacement.  PORTABLE PELVIS  Comparison: MRI from 01/03/2011  Findings: The patient is status post a left hip replacement.  Exam is  reviewed along with the cross-table lateral film obtained at the same time.  Although the distal tip of the femoral prosthesis is not included on this study, it is visualized on the cross-table lateral film reported separately.  No evidence for immediate hardware complication.  There is some gas in the soft tissues consistent with recent surgery.  Degenerative changes are noted in the right hip.  IMPRESSION: Status post left total hip replacement without evidence for immediate hardware complications.  Original Report Authenticated By: ERIC A. MANSELL, MonroeD.   Dg Hip Portable 1 View Left  07/06/2011  *RADIOLOGY REPORT*  Clinical Data: Left total hip arthroplasty.  PORTABLE LEFT HIP - 1 VIEW  Comparison: None.  Findings: Image quality is degraded by body habitus.  A cross-table lateral view of the left hip shows a well-seated femoral stem. Surgical skin staples are in place.  IMPRESSION: Image quality is degraded by body habitus.  Femoral stem appears well seated.  Original Report Authenticated By: Reyes Ivan, MonroeD.    Disposition:   Discharge Orders    Future Orders Please Complete By Expires   Increase activity slowly      Walker       May shower / Bathe      Driving Restrictions      Comments:   No driving for 2 weeks.   Change dressing (specify)      Comments:   Dressing change as needed.   Call MD for:  temperature >100.4      Call MD for:  severe uncontrolled pain      Call MD for:  redness, tenderness, or signs of infection (pain, swelling, redness, odor or green/yellow discharge around incision site)      Discharge instructions      Comments:   F/U with Dr. Turner Monroe as scheduled         Signed: Chade Pitner M. 07/10/2011, 8:16 AM

## 2011-07-10 NOTE — Progress Notes (Signed)
Physical Therapy Treatment Patient Details Name: Julia Monroe MRN: [id number] DOB: 07-03-1970 Today's Date: 07/10/2011 Time: 1884-1660 PT Time Calculation (min): 11 min  PT Assessment / Plan / Recommendation Comments on Treatment Session  Pt admitted s/p left THA and is ready for safe d/c home once medically cleared by MD.    Follow Up Recommendations  Home health PT    Barriers to Discharge        Equipment Recommendations  Rolling walker with 5" wheels;3 in 1 bedside comode;Tub/shower bench    Recommendations for Other Services    Frequency 7X/week   Plan Discharge plan remains appropriate;Frequency remains appropriate    Precautions / Restrictions Precautions Precautions: Posterior Hip Precaution Booklet Issued: No Precaution Comments: Able to recall 3/3 posterior hip precautions. Restrictions Weight Bearing Restrictions: Yes LLE Weight Bearing: Weight bearing as tolerated   Pertinent Vitals/Pain 0/10 with treatment.    Mobility  Bed Mobility Bed Mobility: Not assessed Transfers Transfers: Not assessed Ambulation/Gait Ambulation/Gait Assistance: Not tested (comment) Stairs: No Wheelchair Mobility Wheelchair Mobility: No    Exercises Total Joint Exercises Ankle Circles/Pumps: AROM;Left;Supine;15 reps Quad Sets: AROM;Left;Supine;15 reps Short Arc Quad: Left;15 reps;Supine;AROM Heel Slides: AAROM;Left;Supine;15 reps Hip ABduction/ADduction: AAROM;Left;Supine;15 reps   PT Diagnosis:    PT Problem List:   PT Treatment Interventions:     PT Goals Acute Rehab PT Goals PT Goal Formulation: With patient Time For Goal Achievement: 07/14/11 Potential to Achieve Goals: Good PT Goal: Perform Home Exercise Program - Progress: Progressing toward goal  Visit Information  Last PT Received On: 07/10/11 Assistance Needed: +1    Subjective Data  Subjective: "I don't know what time they will let me go." Patient Stated Goal: Go home.    Cognition  Overall  Cognitive Status: Appears within functional limits for tasks assessed/performed Arousal/Alertness: Awake/alert Orientation Level: Appears intact for tasks assessed Behavior During Session: [facility] for tasks performed    Balance  Balance Balance Assessed: No  End of Session PT - End of Session Activity Tolerance: Patient tolerated treatment well Patient left: in bed;with call bell/phone within reach Nurse Communication: Mobility status    Cephus Shelling 07/10/2011, 1:54 PM  07/10/2011 Cephus Shelling, PT, DPT [phone]

## 2011-07-10 NOTE — Progress Notes (Signed)
Family will help her.

## 2011-07-10 NOTE — Progress Notes (Signed)
PATIENT ID: Julia Monroe  MRN: [id number]  DOB/AGE:  07/20/1970 / 41 y.o.  4 Days Post-Op Procedure(s) (LRB): TOTAL HIP ARTHROPLASTY (Left)    PROGRESS NOTE Subjective: Patient is alert, oriented,no Nausea, no Vomiting, yes passing gas, no Bowel Movement. Taking PO well. Denies SOB, Chest or Calf Pain. Using Incentive Spirometer, PAS in place. Ambulating with PT. Patient reports pain as moderate  .    Objective: Vital signs in last 24 hours: Filed Vitals:   07/09/11 1350 07/09/11 1445 07/09/11 2214 07/10/11 0530  BP: 130/62 132/50 143/66 135/75  Pulse: 92 95 89 97  Temp: 98.7 F (37.1 C) 99.1 F (37.3 C) 100 F (37.8 C) 98.7 F (37.1 C)  TempSrc:    Oral  Resp: 18 22 18 18   Height:      Weight:      SpO2:   100% 97%      Intake/Output from previous day: I/O last 3 completed shifts: In: 1120 [P.O.:1120] Out: -    Intake/Output this shift:     LABORATORY DATA:  Basename 07/09/11 0515 07/08/11 0710  WBC 12.9* 14.3*  HGB 8.1* 9.1*  HCT 24.4* 27.4*  PLT 185 196  NA -- --  K -- --  CL -- --  CO2 -- --  BUN -- --  CREATININE -- --  GLUCOSE -- --  GLUCAP -- --  INR -- --  CALCIUM -- --    Examination: Neurologically intact ABD soft Neurovascular intact Sensation intact distally Intact pulses distally Dorsiflexion/Plantar flexion intact Incision: scant drainage} XR AP&Lat of hip shows well placed\fixed THA  Assessment:   4 Days Post-Op Procedure(s) (LRB): TOTAL HIP ARTHROPLASTY (Left) ADDITIONAL DIAGNOSIS:  Acute Blood Loss Anemia - improved after 1 unit PRBC  Plan: PT/OT WBAT, THA  posterior precautions  DVT Prophylaxis: SCDx72 hrs, ASA 325 mg BID x 2 weeks  DISCHARGE PLAN: Home today  DISCHARGE NEEDS: HHPT, HHRN, Walker and 3-in-1 comode seat

## 2011-07-10 NOTE — Progress Notes (Signed)
Occupational Therapy Discharge  In to see pt for treatment for toilet transfer, tub transfer, and AE use. Pt reports that OT on eval showed her mom the tub transfer bench; however they are going to try and use the wide 3-n-1 in her garden tub and if that does not work then they will get a tub seat/bench on their own. Pt reports she has been getting up to the toilet with A and does not feel she needs to practice this. 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 No further OT needs identified, will sign off.  Julia Monroe, Julia Monroe [phone] 07/10/2011

## 2012-09-05 ENCOUNTER — Other Ambulatory Visit: Payer: Self-pay

## 2015-02-12 DIAGNOSIS — K219 Gastro-esophageal reflux disease without esophagitis: Secondary | ICD-10-CM | POA: Diagnosis not present

## 2015-02-12 DIAGNOSIS — R1013 Epigastric pain: Secondary | ICD-10-CM | POA: Diagnosis not present

## 2015-02-12 DIAGNOSIS — R110 Nausea: Secondary | ICD-10-CM | POA: Diagnosis not present

## 2015-02-13 ENCOUNTER — Other Ambulatory Visit: Payer: Self-pay | Admitting: Gastroenterology

## 2015-02-13 DIAGNOSIS — R1013 Epigastric pain: Secondary | ICD-10-CM

## 2015-02-28 ENCOUNTER — Encounter (HOSPITAL_COMMUNITY): Payer: 59

## 2015-02-28 ENCOUNTER — Ambulatory Visit (HOSPITAL_COMMUNITY): Payer: 59

## 2015-03-07 ENCOUNTER — Encounter: Payer: Self-pay | Admitting: Obstetrics and Gynecology

## 2015-03-07 ENCOUNTER — Ambulatory Visit (INDEPENDENT_AMBULATORY_CARE_PROVIDER_SITE_OTHER): Payer: 59 | Admitting: Obstetrics and Gynecology

## 2015-03-07 VITALS — BP 122/78 | HR 62 | Resp 22 | Ht 70.0 in | Wt 318.0 lb

## 2015-03-07 DIAGNOSIS — R1032 Left lower quadrant pain: Secondary | ICD-10-CM | POA: Diagnosis not present

## 2015-03-07 LAB — POCT URINALYSIS DIPSTICK
Bilirubin, UA: NEGATIVE
Glucose, UA: NEGATIVE
KETONES UA: NEGATIVE
LEUKOCYTES UA: NEGATIVE
NITRITE UA: NEGATIVE
PH UA: 5
PROTEIN UA: NEGATIVE
RBC UA: NEGATIVE
Urobilinogen, UA: NEGATIVE

## 2015-03-07 NOTE — Progress Notes (Signed)
Patient ID: Julia Monroe, female   DOB: [birth year], 45 y.o.   MRN: [id number] 45 y.o. G1P1001 Legally Separated African American female here to establish as a patient and is complaining of left lower quadrant/left vaginal discomfort.   (Points to left groin.)  Patient is a returning patient of mine from years ago.  Pain can occur prior to menses, during menses, and shortly after.  Occurring for 4 months.   Hx of bilateral dermoid cysts of the ovaries.  Thinks a little bit of right ovary remains.  Still has the left ovary.   Having a lot of joint pains.  Status post left hip replacement.  Will do the same on the right eventually.  Takes Tramadol for her pain.  Aleve causes gastritis.   Is doing dietary treatment for anemia.  Not taking iron supplement.   Menses are regular. First day of menses is heavy with clotting.  Pad change every 1 - 2 hours to stay clean, not because they are soaked. TFTs normal at work.   Urine IN:[id number]  PCP: Sharin Mons, NP    Patient's last menstrual period was 02/23/2015 (exact date).          Sexually active: No. female The current method of family planning is abstinence.    Exercising: No.   Smoker:  no  Health Maintenance: Pap:  08/2014 normal per patient History of abnormal Pap:  no MMG:  08/2014 normal per patient at [facility]--read at [place] Colonoscopy:  n/a BMD:   n/a  Result  n/a TDaP:  Up to date with [facility] Screening Labs:  Hb today: PCP, Urine today: negative.   reports that she has never smoked. She does not have any smokeless tobacco history on file. She reports that she does not drink alcohol or use illicit drugs.  Past Medical History  Diagnosis Date  . GERD (gastroesophageal reflux disease)     occ  . Headache(784.0)     hx migraines  . Arthritis     Past Surgical History  Procedure Laterality Date  . Ovarian cyst surgery  ? and 10    rt removed,cyst off lft  . Cesarean section  2000  .  Total hip arthroplasty  07/06/2011    Procedure: TOTAL HIP ARTHROPLASTY;  Surgeon: Kerin Salen, MD;  Location: [place];  Service: Orthopedics;  Laterality: Left;  DEPUY/PINNACLE  . Oophorectomy  2007    Rt.oophorectomy     Current Outpatient Prescriptions  Medication Sig Dispense Refill  . acetaminophen (TYLENOL) 325 MG tablet Take 325 mg by mouth every 6 (six) hours as needed. Takes 2-3 tablets prn arthritis    . Multiple Vitamin (MULITIVITAMIN WITH MINERALS) TABS Take 1 tablet by mouth daily.    . traMADol (ULTRAM) 50 MG tablet Take 1 tablet by mouth as needed.  5  . TURMERIC PO Take 1 tablet by mouth daily.     No current facility-administered medications for this visit.    Family History  Problem Relation Age of Onset  . Osteoarthritis Mother   . Osteoarthritis Brother   . Hypertension Brother   . Osteoarthritis Maternal Grandmother     ROS:  Pertinent items are noted in HPI.  Otherwise, a comprehensive ROS was negative.  Exam:   BP 122/78 mmHg  Pulse 62  Resp 22  Ht 5\' 10"  (1.778 m)  Wt 318 lb (144.244 kg)  BMI 45.63 kg/m2  LMP 02/23/2015 (Exact Date)    General appearance: alert, cooperative and  appears stated age Head: Normocephalic, without obvious abnormality, atraumatic Neck: no adenopathy, supple, symmetrical, trachea midline and thyroid normal to inspection and palpation Lungs: clear to auscultation bilaterally Heart: regular rate and rhythm Abdomen: Pfannenstiel incision, soft, non-tender; bowel sounds normal; no masses,  no organomegaly Extremities: extremities normal, atraumatic, no cyanosis or edema Skin: Skin color, texture, turgor normal. No rashes or lesions Lymph nodes: Cervical, supraclavicular, and axillary nodes normal. No abnormal inguinal nodes palpated Neurologic: Grossly normal  Pelvic: External genitalia:  no lesions              Urethra:  normal appearing urethra with no masses, tenderness or lesions              Bartholins and Skenes:  normal                 Vagina: normal appearing vagina with normal color and discharge, no lesions              Cervix: no lesions           Bimanual Exam:  Uterus:  normal size, contour, position, consistency, mobility, non-tender              Adnexa: normal adnexa and no mass, fullness, tenderness              Rectovaginal: Yes.  .  Confirms.              Anus:  normal sphincter tone, no lesions  Chaperone was present for exam.  Assessment:     Left lower quadrant pain.  Hx bilateral dermoids.  Status post multiple abdominal surgeries. Status post left hip replacement.  Intolerance to Aleve - causes gastritis.  Plan   Review of etiologies of pain - ovulation, ovarian cysts, endometriosis, adhesive disease, constipation, (diverticular disease), urinary, musculoskeletal pain.  Urine dip - negative. Return for ultrasound appointment.    After visit summary provided.   ___30____ minutes face to face time of which over 50% was spent in counseling.

## 2015-03-07 NOTE — Patient Instructions (Signed)
We will call you to schedule your ultrasound appointment.  Thank you for coming today!

## 2015-03-08 ENCOUNTER — Telehealth: Payer: Self-pay | Admitting: Obstetrics and Gynecology

## 2015-03-08 NOTE — Telephone Encounter (Signed)
Spoke with pt regarding benefit for ultrasound. Patient understood and agreeable. Patient ready to schedule. Patient scheduled 03/14/15 with Quincy Simmonds. Pt aware of arrival date and time. Pt aware of 72 hours cancellation policy with [id number] fee. No further questions. Ok to close

## 2015-03-14 ENCOUNTER — Ambulatory Visit (INDEPENDENT_AMBULATORY_CARE_PROVIDER_SITE_OTHER): Payer: 59 | Admitting: Obstetrics and Gynecology

## 2015-03-14 ENCOUNTER — Ambulatory Visit (INDEPENDENT_AMBULATORY_CARE_PROVIDER_SITE_OTHER): Payer: 59

## 2015-03-14 ENCOUNTER — Encounter: Payer: Self-pay | Admitting: Obstetrics and Gynecology

## 2015-03-14 VITALS — BP 120/76 | HR 70 | Ht 70.0 in | Wt 318.0 lb

## 2015-03-14 DIAGNOSIS — D250 Submucous leiomyoma of uterus: Secondary | ICD-10-CM

## 2015-03-14 DIAGNOSIS — R1032 Left lower quadrant pain: Secondary | ICD-10-CM

## 2015-03-14 MED ORDER — NORETHINDRONE 0.35 MG PO TABS: 1.0000 | ORAL_TABLET | Freq: Every day | ORAL | Status: DC

## 2015-03-14 NOTE — Progress Notes (Signed)
Subjective  45 y.o. G1P1001 Legally Separated  African American  female here for pelvic ultrasound for LLQ pain.  Hx of bilateral dermoid cysts.  First day of cycle is heavy.   Patient's last menstrual period was 02/23/2015 (exact date).  Objective  Pelvic ultrasound images and report reviewed with patient.  Uterus - possible submucous fibroid. EMS - endometrial mass 20 mm consistent with possible submucous fibroid.  EMS 15 mm. Ovaries - left ovary 16 mm collapsed CL cyst.  17 mm thick walled cyst.  Free fluid - no      Assessment  LLQ pain.  Hx bilateral dermoid cysts.  Status post right oophorectomy. Submucous fibroid. Menorrhagia.   Plan  Discussion of pelvic pain and fibroids.  Tx options reviewed:  Oral contraceptives, Depo Provera, Lysteda, hysteroscopic resection of fibroid, uterine artery embolization, hysterectomy.  Will do a trial of oral Micronor.   Follow up for annual exam in August and prn.  __25_____ minutes face to face time of which over 50% was spent in counseling.   After visit summary to patient.

## 2015-03-14 NOTE — Patient Instructions (Signed)
Norethindrone tablets (contraception) What is this medicine? NORETHINDRONE (nor eth IN drone) is an oral contraceptive. The product contains a female hormone known as a progestin. It is used to prevent pregnancy. This medicine may be used for other purposes; ask your health care provider or pharmacist if you have questions. What should I tell my health care provider before I take this medicine? They need to know if you have any of these conditions: -blood vessel disease or blood clots -breast, cervical, or vaginal cancer -diabetes -heart disease -kidney disease -liver disease -mental depression -migraine -seizures -stroke -vaginal bleeding -an unusual or allergic reaction to norethindrone, other medicines, foods, dyes, or preservatives -pregnant or trying to get pregnant -breast-feeding How should I use this medicine? Take this medicine by mouth with a glass of water. You may take it with or without food. Follow the directions on the prescription label. Take this medicine at the same time each day and in the order directed on the package. Do not take your medicine more often than directed. Contact your pediatrician regarding the use of this medicine in children. Special care may be needed. This medicine has been used in female children who have started having menstrual periods. A patient package insert for the product will be given with each prescription and refill. Read this sheet carefully each time. The sheet may change frequently. Overdosage: If you think you have taken too much of this medicine contact a poison control center or emergency room at once. NOTE: This medicine is only for you. Do not share this medicine with others. What if I miss a dose? Try not to miss a dose. Every time you miss a dose or take a dose late your chance of pregnancy increases. When 1 pill is missed (even if only 3 hours late), take the missed pill as soon as possible and continue taking a pill each day at  the regular time (use a back up method of birth control for the next 48 hours). If more than 1 dose is missed, use an additional birth control method for the rest of your pill pack until menses occurs. Contact your health care professional if more than 1 dose has been missed. What may interact with this medicine? Do not take this medicine with any of the following medications: -amprenavir or fosamprenavir -bosentan This medicine may also interact with the following medications: -antibiotics or medicines for infections, especially rifampin, rifabutin, rifapentine, and griseofulvin, and possibly penicillins or tetracyclines -aprepitant -barbiturate medicines, such as phenobarbital -carbamazepine -felbamate -modafinil -oxcarbazepine -phenytoin -ritonavir or other medicines for HIV infection or AIDS -St. John's wort -topiramate This list may not describe all possible interactions. Give your health care provider a list of all the medicines, herbs, non-prescription drugs, or dietary supplements you use. Also tell them if you smoke, drink alcohol, or use illegal drugs. Some items may interact with your medicine. What should I watch for while using this medicine? Visit your doctor or health care professional for regular checks on your progress. You will need a regular breast and pelvic exam and Pap smear while on this medicine. Use an additional method of birth control during the first cycle that you take these tablets. If you have any reason to think you are pregnant, stop taking this medicine right away and contact your doctor or health care professional. If you are taking this medicine for hormone related problems, it may take several cycles of use to see improvement in your condition. This medicine does not protect you   against HIV infection (AIDS) or any other sexually transmitted diseases. What side effects may I notice from receiving this medicine? Side effects that you should report to your  doctor or health care professional as soon as possible: -breast tenderness or discharge -pain in the abdomen, chest, groin or leg -severe headache -skin rash, itching, or hives -sudden shortness of breath -unusually weak or tired -vision or speech problems -yellowing of skin or eyes Side effects that usually do not require medical attention (report to your doctor or health care professional if they continue or are bothersome): -changes in sexual desire -change in menstrual flow -facial hair growth -fluid retention and swelling -headache -irritability -nausea -weight gain or loss This list may not describe all possible side effects. Call your doctor for medical advice about side effects. You may report side effects to FDA at 1-800-FDA-1088. Where should I keep my medicine? Keep out of the reach of children. Store at room temperature between 15 and 30 degrees C (59 and 86 degrees F). Throw away any unused medicine after the expiration date. NOTE: This sheet is a summary. It may not cover all possible information. If you have questions about this medicine, talk to your doctor, pharmacist, or health care provider.    2016, Elsevier/Gold Standard. (2011-10-02 16:41:35)  Uterine Fibroids Uterine fibroids are tissue masses (tumors) that can develop in the womb (uterus). They are also called leiomyomas. This type of tumor is not cancerous (benign) and does not spread to other parts of the body outside of the pelvic area, which is between the hip bones. Occasionally, fibroids may develop in the fallopian tubes, in the cervix, or on the support structures (ligaments) that surround the uterus. You can have one or many fibroids. Fibroids can vary in size, weight, and where they grow in the uterus. Some can become quite large. Most fibroids do not require medical treatment. CAUSES A fibroid can develop when a single uterine cell keeps growing (replicating). Most cells in the human body have a  control mechanism that keeps them from replicating without control. SIGNS AND SYMPTOMS Symptoms may include:   Heavy bleeding during your period.  Bleeding or spotting between periods.  Pelvic pain and pressure.  Bladder problems, such as needing to urinate more often (urinary frequency) or urgently.  Inability to reproduce offspring (infertility).  Miscarriages. DIAGNOSIS Uterine fibroids are diagnosed through a physical exam. Your health care provider may feel the lumpy tumors during a pelvic exam. Ultrasonography and an MRI may be done to determine the size, location, and number of fibroids. TREATMENT Treatment may include:  Watchful waiting. This involves getting the fibroid checked by your health care provider to see if it grows or shrinks. Follow your health care provider's recommendations for how often to have this checked.  Hormone medicines. These can be taken by mouth or given through an intrauterine device (IUD).  Surgery.  Removing the fibroids (myomectomy) or the uterus (hysterectomy).  Removing blood supply to the fibroids (uterine artery embolization). If fibroids interfere with your fertility and you want to become pregnant, your health care provider may recommend having the fibroids removed.  HOME CARE INSTRUCTIONS  Keep all follow-up visits as directed by your health care provider. This is important.  Take medicines only as directed by your health care provider.  If you were prescribed a hormone treatment, take the hormone medicines exactly as directed.  Do not take aspirin, because it can cause bleeding.  Ask your health care provider about taking iron pills  and increasing the amount of dark green, leafy vegetables in your diet. These actions can help to boost your blood iron levels, which may be affected by heavy menstrual bleeding.  Pay close attention to your period and tell your health care provider about any changes, such as:  Increased blood flow  that requires you to use more pads or tampons than usual per month.  A change in the number of days that your period lasts per month.  A change in symptoms that are associated with your period, such as abdominal cramping or back pain. SEEK MEDICAL CARE IF:  You have pelvic pain, back pain, or abdominal cramps that cannot be controlled with medicines.  You have an increase in bleeding between and during periods.  You soak tampons or pads in a half hour or less.  You feel lightheaded, extra tired, or weak. SEEK IMMEDIATE MEDICAL CARE IF:  You faint.  You have a sudden increase in pelvic pain.   This information is not intended to replace advice given to you by your health care provider. Make sure you discuss any questions you have with your health care provider.   Document Released: 01/10/2000 Document Revised: 02/02/2014 Document Reviewed: 07/11/2013 Elsevier Interactive Patient Education [employer].

## 2015-04-18 DIAGNOSIS — S239XXA Sprain of unspecified parts of thorax, initial encounter: Secondary | ICD-10-CM | POA: Diagnosis not present

## 2015-04-18 DIAGNOSIS — M549 Dorsalgia, unspecified: Secondary | ICD-10-CM | POA: Diagnosis not present

## 2015-05-22 DIAGNOSIS — M2550 Pain in unspecified joint: Secondary | ICD-10-CM | POA: Diagnosis not present

## 2015-05-28 ENCOUNTER — Telehealth: Payer: Self-pay | Admitting: Obstetrics and Gynecology

## 2015-05-28 MED ORDER — NORETHINDRONE 0.35 MG PO TABS: 1.0000 | ORAL_TABLET | Freq: Every day | ORAL | Status: DC

## 2015-05-28 NOTE — Telephone Encounter (Signed)
Patient wants to talk with the nurse no information given.  °

## 2015-05-28 NOTE — Telephone Encounter (Signed)
Spoke with patient. Advised of message as seen below from [place]. Advised rx for Micronor #1 2RF has been sent in until her aex on 08/12/2015. She is agreeable and verbalizes understanding.  Routing to provider for final review. Patient agreeable to disposition. Will close encounter.

## 2015-05-28 NOTE — Telephone Encounter (Signed)
I will approve Micronor until annual exam is due.  Please send refills to her pharmacy of choice.

## 2015-05-28 NOTE — Telephone Encounter (Signed)
Spoke with patient. Patient states that she is calling to provide Dr.Silva with an update on how she is doing on her Micronor. States the first two months she was on Micronor her cycle lasted for 7 days and was heavy. Reports this month her cycle lasted for 4 days and was much lighter. Patient would like to continue taking Micronor at this time. "I think it is going to work well for me. I am going to keep trying it." Patient is requesting refills on Micronor until her next aex in 07/2015 with Dr.Silva.  Dr.Jertson, okay to send in refills of Micronor until aex in 07/2015 with Dr.Silva? Routing to [place] as Dr.Sivla is out of the office today.  Cc: Dr.Silva

## 2015-07-04 DIAGNOSIS — M1712 Unilateral primary osteoarthritis, left knee: Secondary | ICD-10-CM | POA: Diagnosis not present

## 2015-07-04 DIAGNOSIS — Z96642 Presence of left artificial hip joint: Secondary | ICD-10-CM | POA: Diagnosis not present

## 2015-08-12 ENCOUNTER — Encounter: Payer: Self-pay | Admitting: Obstetrics and Gynecology

## 2015-08-12 ENCOUNTER — Ambulatory Visit (INDEPENDENT_AMBULATORY_CARE_PROVIDER_SITE_OTHER): Payer: 59 | Admitting: Obstetrics and Gynecology

## 2015-08-12 VITALS — BP 130/76 | HR 76 | Resp 18 | Ht 69.5 in | Wt 335.0 lb

## 2015-08-12 DIAGNOSIS — Z0000 Encounter for general adult medical examination without abnormal findings: Secondary | ICD-10-CM

## 2015-08-12 DIAGNOSIS — D250 Submucous leiomyoma of uterus: Secondary | ICD-10-CM

## 2015-08-12 DIAGNOSIS — N920 Excessive and frequent menstruation with regular cycle: Secondary | ICD-10-CM | POA: Diagnosis not present

## 2015-08-12 DIAGNOSIS — Z01419 Encounter for gynecological examination (general) (routine) without abnormal findings: Secondary | ICD-10-CM | POA: Diagnosis not present

## 2015-08-12 LAB — POCT URINALYSIS DIPSTICK
BILIRUBIN UA: NEGATIVE
Glucose, UA: NEGATIVE
KETONES UA: NEGATIVE
Leukocytes, UA: NEGATIVE
NITRITE UA: NEGATIVE
PH UA: 5
Protein, UA: NEGATIVE
Urobilinogen, UA: NEGATIVE

## 2015-08-12 NOTE — Progress Notes (Signed)
45 y.o. G1P1001 Legally Separated African American female here for annual exam.    Menses are regular but has clotting and wants help with this.  Patient was on the Micronor for 4 - 5 months, and her menses became longer and they did not really decrease in quantity.  Pad change every 2 - 3 hours.  Ultrasound done 02/2015 showing possible small submucous fibroid measuring 20 mm.  No EMB to date.   Vit D slightly low with PCP.  HgbA1C is 5.3 - tests herself at work.  Hgb 11.4.  Gaining weight and having hip pain.  May need a hip replacement on the right.  Had hip replacement on the left.   Daughter just had an MVA last week. She is OK.  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   PCP:   Fulp, Cammie  Patient's last menstrual period was 07/27/2015.          Sexually active: No.  The current method of family planning is abstinence.    Exercising: No.  The patient does not participate in regular exercise at present. Smoker:  no  Health Maintenance: Pap:  09/05/12 Neg History of abnormal Pap:  no MMG: 08/2014 Normal [facility] Colonoscopy:  Never BMD: Never TDaP:  Current / works at the hospital. HIV: Unsure Hep C: Unsure Screening Labs:  Hb today: PCP, Urine today: RBC=trace   reports that she has never smoked. She does not have any smokeless tobacco history on file. She reports that she does not drink alcohol or use illicit drugs.  Past Medical History  Diagnosis Date  . GERD (gastroesophageal reflux disease)     occ  . Headache(784.0)     hx migraines  . Arthritis     Past Surgical History  Procedure Laterality Date  . Ovarian cyst surgery  ? and 12    rt removed,cyst off lft  . Cesarean section  2000  . 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 Oophorectomy  2007    Rt.oophorectomy     Current Outpatient  Prescriptions  Medication Sig Dispense Refill  . acetaminophen (TYLENOL) 325 MG tablet Take 325 mg by mouth every 6 (six) hours as needed. Takes 2-3 tablets prn arthritis    . Multiple Vitamin (MULITIVITAMIN WITH MINERALS) TABS Take 1 tablet by mouth daily.    . traMADol (ULTRAM) 50 MG tablet Take 1 tablet by mouth as needed.  5  . TURMERIC PO Take 1 tablet by mouth daily.    . Vitamin D, Ergocalciferol, (DRISDOL) 50000 units CAPS capsule Take 1 capsule by mouth once a week.  0   No current facility-administered medications for this visit.    Family History  Problem Relation Age of Onset  . Osteoarthritis Mother   . Osteoarthritis Brother   . Hypertension Brother   . Osteoarthritis Maternal Grandmother     ROS:  Pertinent items are noted in HPI.  Otherwise, a comprehensive ROS was negative.  Exam:   BP 130/76 mmHg  Pulse 76  Resp 18  Ht 5' 9.5" (1.765 m)  Wt 335 lb (151.955 kg)  BMI 48.78 kg/m2  LMP 07/27/2015    General appearance: alert, cooperative and appears stated age Head: Normocephalic, without obvious abnormality, atraumatic Neck: no adenopathy, supple, symmetrical, trachea midline and thyroid normal to inspection and palpation Lungs: clear to  auscultation bilaterally Breasts: normal appearance, no masses or tenderness, Inspection negative, No nipple retraction or dimpling, No nipple discharge or bleeding, No axillary or supraclavicular adenopathy Heart: regular rate and rhythm Abdomen  soft, non-tender; no masses, no organomegaly Extremities: extremities normal, atraumatic, no cyanosis or edema Skin: Skin color, texture, turgor normal. No rashes or lesions Lymph nodes: Cervical, supraclavicular, and axillary nodes normal. No abnormal inguinal nodes palpated Neurologic: Grossly normal  Pelvic: External genitalia:  no lesions              Urethra:  normal appearing urethra with no masses, tenderness or lesions              Bartholins and Skenes: normal                  Vagina: normal appearing vagina with normal color and discharge, no lesions              Cervix: no lesions              Pap taken: Yes.   Bimanual Exam:  Uterus:  normal size, contour, position, consistency, mobility, non-tender.  BM exam limited by body habitus.              Adnexa: normal adnexa and no mass, fullness, tenderness              Rectal exam: Yes.  .  Confirms.              Anus:  normal sphincter tone, no lesions  Chaperone was present for exam.  Assessment:   Well woman visit with normal exam. Hx of bilateral ovarian dermoid removal.  Menorrhagia.  Submucous fibroid. Mild anemia.   Plan: Yearly mammogram recommended after age 80.  She will schedule at [facility]. Recommended self breast exam.  Pap and HR HPV as above. Guidelines for Calcium, Vitamin D, regular exercise program including cardiovascular and weight bearing exercise. Labs performed.  No..     Prescription medication(s) given.  No..   Patient will return to re-evaluate the submucous fibroid.   We did discuss hysteroscopic removal of the fibroid.  I would like for [place] to recheck it with ultrasound to see if it has grown and if the patient needs any pretreatment with Depo Lupron. Will recheck CBC at ultrasound appointment.  Follow up annually and prn.     After visit summary provided.

## 2015-08-12 NOTE — Patient Instructions (Signed)
Health Maintenance, Female Adopting a healthy lifestyle and getting preventive care can go a long way to promote health and wellness. Talk with your health care provider about what schedule of regular examinations is right for you. This is a good chance for you to check in with your provider about disease prevention and staying healthy. In between checkups, there are plenty of things you can do on your own. Experts have done a lot of research about which lifestyle changes and preventive measures are most likely to keep you healthy. Ask your health care provider for more information. WEIGHT AND DIET  Eat a healthy diet  Be sure to include plenty of vegetables, fruits, low-fat dairy products, and lean protein.  Do not eat a lot of foods high in solid fats, added sugars, or salt.  Get regular exercise. This is one of the most important things you can do for your health.  Most adults should exercise for at least 150 minutes each week. The exercise should increase your heart rate and make you sweat (moderate-intensity exercise).  Most adults should also do strengthening exercises at least twice a week. This is in addition to the moderate-intensity exercise.  Maintain a healthy weight  Body mass index (BMI) is a measurement that can be used to identify possible weight problems. It estimates body fat based on height and weight. Your health care provider can help determine your BMI and help you achieve or maintain a healthy weight.  For females 20 years of age and older:   A BMI below 18.5 is considered underweight.  A BMI of 18.5 to 24.9 is normal.  A BMI of 25 to 29.9 is considered overweight.  A BMI of 30 and above is considered obese.  Watch levels of cholesterol and blood lipids  You should start having your blood tested for lipids and cholesterol at 43 years of age, then have this test every 5 years.  You may need to have your cholesterol levels checked more often if:  Your lipid  or cholesterol levels are high.  You are older than 43 years of age.  You are at high risk for heart disease.  CANCER SCREENING   Lung Cancer  Lung cancer screening is recommended for adults 55-80 years old who are at high risk for lung cancer because of a history of smoking.  A yearly low-dose CT scan of the lungs is recommended for people who:  Currently smoke.  Have quit within the past 15 years.  Have at least a 30-pack-year history of smoking. A pack year is smoking an average of one pack of cigarettes a day for 1 year.  Yearly screening should continue until it has been 15 years since you quit.  Yearly screening should stop if you develop a health problem that would prevent you from having lung cancer treatment.  Breast Cancer  Practice breast self-awareness. This means understanding how your breasts normally appear and feel.  It also means doing regular breast self-exams. Let your health care provider know about any changes, no matter how small.  If you are in your 20s or 30s, you should have a clinical breast exam (CBE) by a health care provider every 1-3 years as part of a regular health exam.  If you are 40 or older, have a CBE every year. Also consider having a breast X-ray (mammogram) every year.  If you have a family history of breast cancer, talk to your health care provider about genetic screening.  If you   are at high risk for breast cancer, talk to your health care provider about having an MRI and a mammogram every year.  Breast cancer gene (BRCA) assessment is recommended for women who have family members with BRCA-related cancers. BRCA-related cancers include:  Breast.  Ovarian.  Tubal.  Peritoneal cancers.  Results of the assessment will determine the need for genetic counseling and BRCA1 and BRCA2 testing. Cervical Cancer Your health care provider may recommend that you be screened regularly for cancer of the pelvic organs (ovaries, uterus, and  vagina). This screening involves a pelvic examination, including checking for microscopic changes to the surface of your cervix (Pap test). You may be encouraged to have this screening done every 3 years, beginning at age 21.  For women ages 30-65, health care providers may recommend pelvic exams and Pap testing every 3 years, or they may recommend the Pap and pelvic exam, combined with testing for human papilloma virus (HPV), every 5 years. Some types of HPV increase your risk of cervical cancer. Testing for HPV may also be done on women of any age with unclear Pap test results.  Other health care providers may not recommend any screening for nonpregnant women who are considered low risk for pelvic cancer and who do not have symptoms. Ask your health care provider if a screening pelvic exam is right for you.  If you have had past treatment for cervical cancer or a condition that could lead to cancer, you need Pap tests and screening for cancer for at least 20 years after your treatment. If Pap tests have been discontinued, your risk factors (such as having a new sexual partner) need to be reassessed to determine if screening should resume. Some women have medical problems that increase the chance of getting cervical cancer. In these cases, your health care provider may recommend more frequent screening and Pap tests. Colorectal Cancer  This type of cancer can be detected and often prevented.  Routine colorectal cancer screening usually begins at 43 years of age and continues through 43 years of age.  Your health care provider may recommend screening at an earlier age if you have risk factors for colon cancer.  Your health care provider may also recommend using home test kits to check for hidden blood in the stool.  A small camera at the end of a tube can be used to examine your colon directly (sigmoidoscopy or colonoscopy). This is done to check for the earliest forms of colorectal  cancer.  Routine screening usually begins at age 50.  Direct examination of the colon should be repeated every 5-10 years through 43 years of age. However, you may need to be screened more often if early forms of precancerous polyps or small growths are found. Skin Cancer  Check your skin from head to toe regularly.  Tell your health care provider about any new moles or changes in moles, especially if there is a change in a mole's shape or color.  Also tell your health care provider if you have a mole that is larger than the size of a pencil eraser.  Always use sunscreen. Apply sunscreen liberally and repeatedly throughout the day.  Protect yourself by wearing long sleeves, pants, a wide-brimmed hat, and sunglasses whenever you are outside. HEART DISEASE, DIABETES, AND HIGH BLOOD PRESSURE   High blood pressure causes heart disease and increases the risk of stroke. High blood pressure is more likely to develop in:  People who have blood pressure in the high end   of the normal range (130-139/85-89 mm Hg).  People who are overweight or obese.  People who are African American.  If you are 38-23 years of age, have your blood pressure checked every 3-5 years. If you are 61 years of age or older, have your blood pressure checked every year. You should have your blood pressure measured twice--once when you are at a hospital or clinic, and once when you are not at a hospital or clinic. Record the average of the two measurements. To check your blood pressure when you are not at a hospital or clinic, you can use:  An automated blood pressure machine at a pharmacy.  A home blood pressure monitor.  If you are between 45 years and 39 years old, ask your health care provider if you should take aspirin to prevent strokes.  Have regular diabetes screenings. This involves taking a blood sample to check your fasting blood sugar level.  If you are at a normal weight and have a low risk for diabetes,  have this test once every three years after 43 years of age.  If you are overweight and have a high risk for diabetes, consider being tested at a younger age or more often. PREVENTING INFECTION  Hepatitis B  If you have a higher risk for hepatitis B, you should be screened for this virus. You are considered at high risk for hepatitis B if:  You were born in a country where hepatitis B is common. Ask your health care provider which countries are considered high risk.  Your parents were born in a high-risk country, and you have not been immunized against hepatitis B (hepatitis B vaccine).  You have HIV or AIDS.  You use needles to inject street drugs.  You live with someone who has hepatitis B.  You have had sex with someone who has hepatitis B.  You get hemodialysis treatment.  You take certain medicines for conditions, including cancer, organ transplantation, and autoimmune conditions. Hepatitis C  Blood testing is recommended for:  Everyone born from [birth year] through 1963.  Anyone with known risk factors for hepatitis C. Sexually transmitted infections (STIs)  You should be screened for sexually transmitted infections (STIs) including gonorrhea and chlamydia if:  You are sexually active and are younger than 43 years of age.  You are older than 43 years of age and your health care provider tells you that you are at risk for this type of infection.  Your sexual activity has changed since you were last screened and you are at an increased risk for chlamydia or gonorrhea. Ask your health care provider if you are at risk.  If you do not have HIV, but are at risk, it may be recommended that you take a prescription medicine daily to prevent HIV infection. This is called pre-exposure prophylaxis (PrEP). You are considered at risk if:  You are sexually active and do not regularly use condoms or know the HIV status of your partner(s).  You take drugs by injection.  You are sexually  active with a partner who has HIV. Talk with your health care provider about whether you are at high risk of being infected with HIV. If you choose to begin PrEP, you should first be tested for HIV. You should then be tested every 3 months for as long as you are taking PrEP.  PREGNANCY   If you are premenopausal and you may become pregnant, ask your health care provider about preconception counseling.  If you may  become pregnant, take 400 to 800 micrograms (mcg) of folic acid every day.  If you want to prevent pregnancy, talk to your health care provider about birth control (contraception). OSTEOPOROSIS AND MENOPAUSE   Osteoporosis is a disease in which the bones lose minerals and strength with aging. This can result in serious bone fractures. Your risk for osteoporosis can be identified using a bone density scan.  If you are 61 years of age or older, or if you are at risk for osteoporosis and fractures, ask your health care provider if you should be screened.  Ask your health care provider whether you should take a calcium or vitamin D supplement to lower your risk for osteoporosis.  Menopause may have certain physical symptoms and risks.  Hormone replacement therapy may reduce some of these symptoms and risks. Talk to your health care provider about whether hormone replacement therapy is right for you.  HOME CARE INSTRUCTIONS   Schedule regular health, dental, and eye exams.  Stay current with your immunizations.   Do not use any tobacco products including cigarettes, chewing tobacco, or electronic cigarettes.  If you are pregnant, do not drink alcohol.  If you are breastfeeding, limit how much and how often you drink alcohol.  Limit alcohol intake to no more than 1 drink per day for nonpregnant women. One drink equals 12 ounces of beer, 5 ounces of wine, or 1 ounces of hard liquor.  Do not use street drugs.  Do not share needles.  Ask your health care provider for help if  you need support or information about quitting drugs.  Tell your health care provider if you often feel depressed.  Tell your health care provider if you have ever been abused or do not feel safe at home.   This information is not intended to replace advice given to you by your health care provider. Make sure you discuss any questions you have with your health care provider.   Document Released: 07/28/2010 Document Revised: 02/02/2014 Document Reviewed: 12/14/2012 Elsevier Interactive Patient Education [employer].

## 2015-08-13 ENCOUNTER — Other Ambulatory Visit: Payer: Self-pay | Admitting: Obstetrics and Gynecology

## 2015-08-13 DIAGNOSIS — Z1231 Encounter for screening mammogram for malignant neoplasm of breast: Secondary | ICD-10-CM

## 2015-08-14 DIAGNOSIS — Z01419 Encounter for gynecological examination (general) (routine) without abnormal findings: Secondary | ICD-10-CM | POA: Diagnosis not present

## 2015-08-14 DIAGNOSIS — Z1151 Encounter for screening for human papillomavirus (HPV): Secondary | ICD-10-CM | POA: Diagnosis not present

## 2015-08-16 LAB — IPS PAP TEST WITH HPV

## 2015-08-20 ENCOUNTER — Telehealth: Payer: Self-pay | Admitting: Obstetrics and Gynecology

## 2015-08-20 NOTE — Telephone Encounter (Signed)
Patient called requesting to speak with the nurse. She said, "I have an ultrasound this Thursday but my period has come on. I don't know if I should keep my appointment or not."

## 2015-08-20 NOTE — Telephone Encounter (Signed)
Spoke with patient. Patient states that she started her menses yesterday and is changing her pad/tampon every 2 hours. States that her bleeding usually lightens by the 3rd day. She is scheduled to have a PUS with Dr.Silva on 08/22/2015. Advised we can perform ultrasounds while the patient is on their cycle if they feel comfortable. She is concerned about having her PUS with heavy bleeding. Advised I will review with Dr.Sivla and return call with further recommendations. She is agreeable.

## 2015-08-20 NOTE — Telephone Encounter (Signed)
Ok to keep ultrasound appointment.

## 2015-08-20 NOTE — Telephone Encounter (Signed)
Spoke with patient. Advised of message as seen below from [place]. She is agreeable to keep appointment as scheduled for 08/22/2015.  Routing to provider for final review. Patient agreeable to disposition. Will close encounter.

## 2015-08-22 ENCOUNTER — Ambulatory Visit (INDEPENDENT_AMBULATORY_CARE_PROVIDER_SITE_OTHER): Payer: 59

## 2015-08-22 ENCOUNTER — Encounter: Payer: Self-pay | Admitting: Obstetrics and Gynecology

## 2015-08-22 ENCOUNTER — Ambulatory Visit (INDEPENDENT_AMBULATORY_CARE_PROVIDER_SITE_OTHER): Payer: 59 | Admitting: Obstetrics and Gynecology

## 2015-08-22 VITALS — BP 126/86 | HR 66 | Ht 69.5 in | Wt 335.0 lb

## 2015-08-22 DIAGNOSIS — N921 Excessive and frequent menstruation with irregular cycle: Secondary | ICD-10-CM | POA: Diagnosis not present

## 2015-08-22 DIAGNOSIS — N920 Excessive and frequent menstruation with regular cycle: Secondary | ICD-10-CM | POA: Diagnosis not present

## 2015-08-22 DIAGNOSIS — D250 Submucous leiomyoma of uterus: Secondary | ICD-10-CM

## 2015-08-22 NOTE — Progress Notes (Signed)
Ultrasound pictures and report reviewed Submucous mass suggestive of fibroid 25 mm - slightly increased. Limited of fibroid not easily defined.  Left ovary normal.  Right ovary absent.  No free fluid.

## 2015-08-22 NOTE — Progress Notes (Signed)
GYNECOLOGY  VISIT   HPI: 45 y.o.   Single  African American  female   [id number] with Patient's last menstrual period was 07/27/2015 (approximate).   here for pelvic ultrasound for _____menorrhagia and known submucous fibroid_____________.    Having heavy and irregular bleeding.  Cramps when she is clotting.  Stopped Micronor.   Declines future childbearing.  Hgb 11.4 at work.   GYNECOLOGIC HISTORY: Patient's last menstrual period was 07/27/2015 (approximate). Contraception:  Abstinence. Menopausal hormone therapy:  NA Last mammogram: August 2016 - [facility].  Last pap smear:  08/14/14 - WNL, negative HR HPV.        OB History    Gravida Para Term Preterm AB Living   1 1 1     1    SAB TAB Ectopic Multiple Live Births                     Patient Active Problem List   Diagnosis Date Noted  . Osteoarthritis of left hip 07/10/2011    Past Medical History:  Diagnosis Date  . Arthritis   . GERD (gastroesophageal reflux disease)    occ  . Headache(784.0)    hx migraines    Past Surgical History:  Procedure Laterality Date  . CESAREAN SECTION  2000  . OOPHORECTOMY  2007   Rt.oophorectomy   . OVARIAN CYST SURGERY  ? and 10   rt removed,cyst off lft  . TOTAL HIP ARTHROPLASTY  07/06/2011   Procedure: TOTAL HIP ARTHROPLASTY;  Surgeon: Kerin Salen, MD;  Location: [facility];  Service: Orthopedics;  Laterality: Left;  DEPUY/PINNACLE    Current Outpatient Prescriptions  Medication Sig Dispense Refill  . acetaminophen (TYLENOL) 325 MG tablet Take 325 mg by mouth every 6 (six) hours as needed. Takes 2-3 tablets prn arthritis    . Multiple Vitamin (MULITIVITAMIN WITH MINERALS) TABS Take 1 tablet by mouth daily.    . traMADol (ULTRAM) 50 MG tablet Take 1 tablet by mouth as needed.  5  . TURMERIC PO Take 1 tablet by mouth daily.    . Vitamin D, Ergocalciferol, (DRISDOL) 50000 units CAPS capsule Take 1 capsule by mouth once a week.  0   No current facility-administered  medications for this visit.      ALLERGIES: Caffeine; Hydrocodone; and Monosodium glutamate  Family History  Problem Relation Age of Onset  . Osteoarthritis Mother   . Osteoarthritis Brother   . Hypertension Brother   . Osteoarthritis Maternal Grandmother     Social History   Social History  . Marital status: Single    Spouse name: N/A  . Number of children: N/A  . Years of education: N/A   Occupational History  . Not on file.   Social History Main Topics  . Smoking status: Never Smoker  . Smokeless tobacco: Never Used  . Alcohol use No  . Drug use: No  . Sexual activity: No   Other Topics Concern  . Not on file   Social History Narrative  . No narrative on file    ROS:  Pertinent items are noted in HPI.  PHYSICAL EXAMINATION:    BP 126/86 (BP Location: Left Arm, Patient Position: Sitting, Cuff Size: Large)   Pulse 66   Ht 5' 9.5" (1.765 m)   Wt (!) 335 lb (152 kg)   LMP 07/27/2015 (Approximate)   BMI 48.76 kg/m     General appearance: alert, cooperative and appears stated age    ASSESSMENT  Submucous  fibroid.  It is not clear if this extends all the way to the uterine serosa.  Hx Cesarean Section.   PLAN  Discussion of uterine fibroid.  I think that a sonohysterogram will be helpful to define the fibroid extent further and do a potential EMB.  I did discuss laparoscopic hysterectomy with removal of one remaining tube as an alternative to hysteroscopic myomectomy.  Questions invited and answered.    An After Visit Summary was printed and given to the patient.  _25_____ minutes face to face time of which over 50% was spent in counseling.

## 2015-09-05 ENCOUNTER — Other Ambulatory Visit: Payer: Self-pay | Admitting: Obstetrics and Gynecology

## 2015-09-05 ENCOUNTER — Ambulatory Visit (INDEPENDENT_AMBULATORY_CARE_PROVIDER_SITE_OTHER): Payer: 59 | Admitting: Obstetrics and Gynecology

## 2015-09-05 ENCOUNTER — Ambulatory Visit (INDEPENDENT_AMBULATORY_CARE_PROVIDER_SITE_OTHER): Payer: 59

## 2015-09-05 ENCOUNTER — Encounter: Payer: Self-pay | Admitting: Obstetrics and Gynecology

## 2015-09-05 VITALS — BP 144/82 | HR 66 | Ht 69.5 in | Wt 335.0 lb

## 2015-09-05 DIAGNOSIS — N921 Excessive and frequent menstruation with irregular cycle: Secondary | ICD-10-CM | POA: Diagnosis not present

## 2015-09-05 DIAGNOSIS — N83209 Unspecified ovarian cyst, unspecified side: Secondary | ICD-10-CM

## 2015-09-05 DIAGNOSIS — D251 Intramural leiomyoma of uterus: Secondary | ICD-10-CM | POA: Diagnosis not present

## 2015-09-05 DIAGNOSIS — D250 Submucous leiomyoma of uterus: Secondary | ICD-10-CM

## 2015-09-05 LAB — CBC
HEMATOCRIT: 35.4 % (ref 35.0–45.0)
HEMOGLOBIN: 11.2 g/dL — AB (ref 11.7–15.5)
MCH: 25.8 pg — AB (ref 27.0–33.0)
MCHC: 31.6 g/dL — AB (ref 32.0–36.0)
MCV: 81.6 fL (ref 80.0–100.0)
MPV: 9.3 fL (ref 7.5–12.5)
Platelets: 308 10*3/uL (ref 140–400)
RBC: 4.34 MIL/uL (ref 3.80–5.10)
RDW: 16.3 % — ABNORMAL HIGH (ref 11.0–15.0)
WBC: 11.5 10*3/uL — ABNORMAL HIGH (ref 3.8–10.8)

## 2015-09-05 NOTE — Progress Notes (Signed)
Opened in error

## 2015-09-05 NOTE — Progress Notes (Signed)
GYNECOLOGY  VISIT   HPI: 45 y.o.   Single  African American  female   [id number] with Patient's last menstrual period was 07/27/2015 (approximate).   here for  sonohysterogram and endometrial biopsy for menorrhagia with irregular menses.  Has a known fibroid that is intracavitary and the limits of the fibroid are not clear from the previous pelvic ultrasound.   Menses every 22 - 28 days. Has a lot of clotting.  States Hgb is about 11.4 at work when she tests informally.  Aches when she has her cycle.   Micronor did not help with bleeding.   Declines future childbearing.   GYNECOLOGIC HISTORY: Patient's last menstrual period was 07/27/2015 (approximate). Contraception: Abstinence. Menopausal hormone therapy:   NA Last mammogram:  August 2016 [facility].  Last pap smear:  08/14/14 - WNL, negative HR HPV.         OB History    Gravida Para Term Preterm AB Living   1 1 1     1    SAB TAB Ectopic Multiple Live Births                     Patient Active Problem List   Diagnosis Date Noted  . Osteoarthritis of left hip 07/10/2011    Past Medical History:  Diagnosis Date  . Arthritis   . GERD (gastroesophageal reflux disease)    occ  . Headache(784.0)    hx migraines    Past Surgical History:  Procedure Laterality Date  . CESAREAN SECTION  2000  . OOPHORECTOMY  2007   Rt.oophorectomy   . OVARIAN CYST SURGERY  ? and 10   rt removed,cyst off lft  . TOTAL HIP ARTHROPLASTY  07/06/2011   Procedure: TOTAL HIP ARTHROPLASTY;  Surgeon: Kerin Salen, MD;  Location: [facility];  Service: Orthopedics;  Laterality: Left;  DEPUY/PINNACLE    Current Outpatient Prescriptions  Medication Sig Dispense Refill  . acetaminophen (TYLENOL) 325 MG tablet Take 325 mg by mouth every 6 (six) hours as needed. Takes 2-3 tablets prn arthritis    . Multiple Vitamin (MULITIVITAMIN WITH MINERALS) TABS Take 1 tablet by mouth daily.    . traMADol (ULTRAM) 50 MG tablet Take 1 tablet by mouth as needed.  5   . TURMERIC PO Take 1 tablet by mouth daily.    . Vitamin D, Ergocalciferol, (DRISDOL) 50000 units CAPS capsule Take 1 capsule by mouth once a week.  0   No current facility-administered medications for this visit.      ALLERGIES: Caffeine; Hydrocodone; and Monosodium glutamate  Family History  Problem Relation Age of Onset  . Osteoarthritis Mother   . Osteoarthritis Brother   . Hypertension Brother   . Osteoarthritis Maternal Grandmother     Social History   Social History  . Marital status: Single    Spouse name: N/A  . Number of children: N/A  . Years of education: N/A   Occupational History  . Not on file.   Social History Main Topics  . Smoking status: Never Smoker  . Smokeless tobacco: Never Used  . Alcohol use No  . Drug use: No  . Sexual activity: No   Other Topics Concern  . Not on file   Social History Narrative  . No narrative on file    ROS:  Pertinent items are noted in HPI.  PHYSICAL EXAMINATION:    Ht 5' 9.5" (1.765 m)   LMP 07/27/2015 (Approximate)     General appearance:  alert, cooperative and appears stated age   Technique:  Both transabdominal and transvaginal ultrasound examinations of the pelvis were performed. Transabdominal technique was performed for global imaging of the pelvis including uterus, ovaries, adnexal regions, and pelvic cul-de-sac. It was necessary to proceed with endovaginal exam following the abdominal ultrasound.  Transabdominal exam to visualize the endometrium and adnexa.  Color and duplex Doppler ultrasound was utilized to evaluate blood flow to the ovaries.   ultrasound with 25 mm submucous fibroid. Left ovary with 47 x 48 mm hemorrhagic cyst.  No right adnexal mass.  No free fluid.   Procedure - sonohysterogram Consent performed. Speculum placed in vagina. Sterile prep of cervix with Hibiclens Cannula placed inside endometrial cavity without difficulty. Speculum removed. Sterile saline injected.      A         filling defect noted which is a transmural fibroid with a submucous component.  Cannula removed. No complication.   Procedure - endometrial biopsy Consent performed. Speculum place in vagina.  Sterile prep of cervix with Hibiclens Tenaculum to anterior cervical lip. Pipelle placed to     10     cm without difficulty twice. Tissue obtained and sent to pathology. Speculum removed.  No complications. Minimal EBL.  ASSESSMENT  Menorrhagia with irregular cycle. Transmural fibroid. Hemorrhagic left ovarian cyst.  Hx bilateral dermoid ovarian cysts.  Status post right oophorectomy.  Hx Cesarean Section.   PLAN  Post EMB instructions given.  Check CBC. Discussion of uterine fibroid and options for Depo Provera, uterine artery embolization, hysteroscopy with attempted resection of myoma, which may be incomplete and require additional medical or surgical therapy versus robotic total laparoscopic hysterectomy, left salpingectomy, possible left ovarian cystectomy, possible left oophorectomy, cystoscopy.  Risks and benefits reviewed.  Patient wishes for definitive surgical therapy for bleeding and wants to consider hysterectomy.  Plan for Lovenox for DVT prophylaxis for hysterectomy.    An After Visit Summary was printed and given to the patient.  __25____ minutes face to face time of which over 50% was spent in counseling.

## 2015-09-05 NOTE — Patient Instructions (Signed)

## 2015-09-09 ENCOUNTER — Telehealth: Payer: Self-pay | Admitting: Obstetrics and Gynecology

## 2015-09-09 LAB — IPS OTHER TISSUE BIOPSY

## 2015-09-09 NOTE — Telephone Encounter (Signed)
Depo Provera may be a good option for treatment of her bleeding from her fibroid.  The most common side effect is irregular bleeding while she is first starting with the treatment.  Weight gain and acne can also occur.  Although headache may occur, most patients do not report this in my experience.  In fact, for a patient with migraines, especially with aura, this is a medication that is used for contraception as it is estrogen free.  I think it may be worth trying this.  If it does not work to control her bleeding or if she has side effects, we can try another option to treat her fibroid.

## 2015-09-09 NOTE — Telephone Encounter (Signed)
Spoke with patient. Advised of message as seen below from [place]. She is agreeable and verbalizes understanding. She would like to try the depo provera. Aware she will need to contact the office with the first day of her next menstrual cycle to schedule her first injection.  Routing to provider for final review. Patient agreeable to disposition. Will close encounter.

## 2015-09-09 NOTE — Telephone Encounter (Signed)
Left message to call Kamren Heintzelman at [phone]. 

## 2015-09-09 NOTE — Telephone Encounter (Signed)
Spoke with patient. Patient states that she is considering starting Depo Provera. Found out that Depo Provera can increase headaches. Reports she has a history of migraines with auras. She is prescribed Cambia for migraines. Reports she has these only on occasion and they are usually triggered by stress, caffeine, or eating habits. Patient is asking if Dr.Silva still recommends Depo Provera for her as this is a concern. Advised I will speak with Dr.Silva and return call with further recommendations. She is agreeable.

## 2015-09-16 ENCOUNTER — Telehealth: Payer: Self-pay | Admitting: Obstetrics and Gynecology

## 2015-09-16 NOTE — Telephone Encounter (Addendum)
Patient says she was told to call when her period came on to schedule for the depo injection. Cycle started Sunday. If can't reach her by work number call cell [phone].

## 2015-09-16 NOTE — Telephone Encounter (Signed)
Left message to call Kaitlyn at [phone]. 

## 2015-09-16 NOTE — Telephone Encounter (Signed)
Spoke with patient. Patient started her menses yesterday 09/15/2015 and would like to schedule her Depo injection. Appointment scheduled for 09/17/2015 at 3 pm. She is agreeable to date and time.  Routing to provider for final review. Patient agreeable to disposition. Will close encounter.

## 2015-09-17 ENCOUNTER — Ambulatory Visit (INDEPENDENT_AMBULATORY_CARE_PROVIDER_SITE_OTHER): Payer: 59

## 2015-09-17 VITALS — BP 118/72 | HR 84 | Resp 16 | Ht 69.5 in | Wt 340.0 lb

## 2015-09-17 DIAGNOSIS — Z3042 Encounter for surveillance of injectable contraceptive: Secondary | ICD-10-CM

## 2015-09-17 MED ORDER — MEDROXYPROGESTERONE ACETATE 150 MG/ML IM SUSP
150.0000 mg | Freq: Once | INTRAMUSCULAR | Status: AC
  Administered 2015-09-17: 150 mg via INTRAMUSCULAR

## 2015-09-17 NOTE — Progress Notes (Signed)
Patient is here for Depo Provera Injection Next Depo Due between: 11/7 - 11/21 Last AEX: 08/12/15 BS AEX Scheduled: 08/26/16   Patient is aware when next depo is due  Pt tolerated Injection well in [place].  Routed to provider for review, encounter closed.

## 2015-09-25 ENCOUNTER — Telehealth: Payer: Self-pay | Admitting: Obstetrics and Gynecology

## 2015-09-25 NOTE — Telephone Encounter (Signed)
Spoke to patient & pt states after every appt that she has with [place] that she gets an automated call telling her that she needs to call us to reschedule her appt. I checked with front office staff as well an RN from triage & we don't see any reason why she should be getting calls. Pt was told to call us again if she gets anymore calls like that before her next appt unless it is a reminder call for her appt. Pt agrees.

## 2015-09-25 NOTE — Telephone Encounter (Signed)
Patient states she keeps getting an automated call telling her to call the office.

## 2015-10-21 ENCOUNTER — Ambulatory Visit: Payer: 59

## 2015-10-21 ENCOUNTER — Ambulatory Visit
Admission: RE | Admit: 2015-10-21 | Discharge: 2015-10-21 | Disposition: A | Discharge status: Home / Self Care | Payer: 59 | Source: Ambulatory Visit | Attending: Obstetrics and Gynecology | Admitting: Obstetrics and Gynecology

## 2015-10-21 DIAGNOSIS — Z1231 Encounter for screening mammogram for malignant neoplasm of breast: Secondary | ICD-10-CM | POA: Diagnosis not present

## 2015-11-12 DIAGNOSIS — M1712 Unilateral primary osteoarthritis, left knee: Secondary | ICD-10-CM | POA: Diagnosis not present

## 2015-11-14 ENCOUNTER — Telehealth: Payer: Self-pay | Admitting: Obstetrics and Gynecology

## 2015-11-14 NOTE — Telephone Encounter (Signed)
Spoke with patient. Advised as seen below per Dr. Quincy Simmonds. Patient verbalizes understanding and is agreeable.     Routing to provider for final review. Patient is agreeable to disposition. Will close encounter.

## 2015-11-14 NOTE — Telephone Encounter (Signed)
Spoke with patient. Patient states received the Depo Provera injection  09/17/15. Patient states she started spotting 09/20/15 and has continue to spot since, no full cycyle. Patient asking if this is normal. Advised with new contraceptives it takes time for body and cycle to adjust, spotting and irregular cycles are normal. 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 Patient states she is eating more iron rich foods and taking daily liquid iron supplement. Patient states she has lost 11 lbs. Patient denies any pain, dizziness, SHOB, fatigue. Advised patient RN would review spotting with Dr. Quincy Simmonds and return call for any additional recommendations. Patient is agreeable.  09/05/15 -OV irregular cycles; sonohystogram & EMB     Dr. Quincy Simmonds, any additional recommendations for spotting after depo?

## 2015-11-14 NOTE — Telephone Encounter (Signed)
Patient has a question for Dr.Silva's nurse regarding her cycle.

## 2015-11-14 NOTE — Telephone Encounter (Signed)
I agree with your recommendations and guidance for patient.  The spotting is very common with the Depo Provera.  I would continue with her next injection as scheduled. Usually after the 3rd injection, menses may completely stop!

## 2015-11-27 DIAGNOSIS — M1990 Unspecified osteoarthritis, unspecified site: Secondary | ICD-10-CM | POA: Diagnosis not present

## 2015-12-03 ENCOUNTER — Ambulatory Visit (INDEPENDENT_AMBULATORY_CARE_PROVIDER_SITE_OTHER): Payer: 59

## 2015-12-03 VITALS — BP 122/76 | HR 84 | Resp 16 | Ht 70.0 in | Wt 332.0 lb

## 2015-12-03 DIAGNOSIS — Z3042 Encounter for surveillance of injectable contraceptive: Secondary | ICD-10-CM

## 2015-12-03 MED ORDER — MEDROXYPROGESTERONE ACETATE 150 MG/ML IM SUSP
150.0000 mg | Freq: Once | INTRAMUSCULAR | Status: AC
  Administered 2015-12-03: 150 mg via INTRAMUSCULAR

## 2015-12-03 NOTE — Progress Notes (Signed)
Patient is here for Depo Provera Injection Patient is within Depo Provera Calender Limits 11/7-11/21 Next Depo Due between: 1/23-2/6 Last AEX: 08/12/15 BS AEX Scheduled: 08/26/16  Patient is aware when next depo is due  Pt tolerated Injection well in [place].  Routed to provider for review, encounter closed.

## 2015-12-05 DIAGNOSIS — M1712 Unilateral primary osteoarthritis, left knee: Secondary | ICD-10-CM | POA: Diagnosis not present

## 2015-12-12 DIAGNOSIS — M1712 Unilateral primary osteoarthritis, left knee: Secondary | ICD-10-CM | POA: Diagnosis not present

## 2015-12-24 DIAGNOSIS — M1712 Unilateral primary osteoarthritis, left knee: Secondary | ICD-10-CM | POA: Diagnosis not present

## 2016-01-08 DIAGNOSIS — M25552 Pain in left hip: Secondary | ICD-10-CM | POA: Diagnosis not present

## 2016-01-27 DIAGNOSIS — IMO0002 Reserved for concepts with insufficient information to code with codable children: Secondary | ICD-10-CM

## 2016-01-27 DIAGNOSIS — M329 Systemic lupus erythematosus, unspecified: Secondary | ICD-10-CM

## 2016-01-27 DIAGNOSIS — R7309 Other abnormal glucose: Secondary | ICD-10-CM

## 2016-01-27 HISTORY — DX: Reserved for concepts with insufficient information to code with codable children: IMO0002

## 2016-01-27 HISTORY — DX: Other abnormal glucose: R73.09

## 2016-01-27 HISTORY — DX: Systemic lupus erythematosus, unspecified: M32.9

## 2016-02-18 ENCOUNTER — Ambulatory Visit (INDEPENDENT_AMBULATORY_CARE_PROVIDER_SITE_OTHER): Payer: 59

## 2016-02-18 VITALS — BP 124/78 | HR 74 | Resp 16 | Ht 70.0 in | Wt 340.0 lb

## 2016-02-18 DIAGNOSIS — Z3042 Encounter for surveillance of injectable contraceptive: Secondary | ICD-10-CM

## 2016-02-18 MED ORDER — MEDROXYPROGESTERONE ACETATE 150 MG/ML IM SUSP
150.0000 mg | Freq: Once | INTRAMUSCULAR | Status: AC
  Administered 2016-02-18: 150 mg via INTRAMUSCULAR

## 2016-02-18 NOTE — Progress Notes (Signed)
Patient is here for Depo Provera Injection Patient is within Depo Provera Calender Limits 02/18/16-03/03/16 Next Depo Due between: 05/05/16-05/19/16 Last AEX: 08/12/15 BS AEX Scheduled: 08/26/16 BS  Patient is aware when next depo is due  Pt tolerated Injection well. Injection given in [place].  Routed to provider for review, encounter closed.

## 2016-02-25 DIAGNOSIS — M1712 Unilateral primary osteoarthritis, left knee: Secondary | ICD-10-CM | POA: Diagnosis not present

## 2016-03-02 ENCOUNTER — Telehealth: Payer: Self-pay | Admitting: Obstetrics and Gynecology

## 2016-03-02 NOTE — Telephone Encounter (Signed)
Please schedule office visit with me.  

## 2016-03-02 NOTE — Telephone Encounter (Signed)
Left message to call Sheba Whaling at [phone].  

## 2016-03-02 NOTE — Telephone Encounter (Signed)
Spoke with patient. Patient states that she has been receiving Depo Provera injections to help stop her cycle to bring her hemoglobin up so that she may have surgery with Dr.Silva. States since starting Depo she has been having spotting daily. Is also having increased shedding of hair since starting Depo. Denies any heavy bleeding. Patient is taking liquid iron daily. Had her hemoglobin checked at work and it is 11.5 per patient. Patient does not desire to continue with Depo at this time and would like to discuss next steps to schedule surgery. Advised I will speak with Dr.Silva and return call. Patient is agreeable.

## 2016-03-02 NOTE — Telephone Encounter (Signed)
Patient called and cancelled her appointment for depo provera on 05/05/16. She said, "I don't want to take that anymore. My hair has been shedding and my period is not going off like it should."  Patient aware a nurse will call her to follow up.  Routing as a high priority due to patient's cycle problem.  Last seen: 02/18/16 for depo

## 2016-03-03 NOTE — Telephone Encounter (Signed)
Spoke with patient. Appointment scheduled for tomorrow 03/04/2016 at 3 pm with Dr.Silva. Patient is agreeable to date and time.  Routing to provider for final review. Patient agreeable to disposition. Will close encounter.

## 2016-03-04 ENCOUNTER — Ambulatory Visit (INDEPENDENT_AMBULATORY_CARE_PROVIDER_SITE_OTHER): Payer: 59 | Admitting: Obstetrics and Gynecology

## 2016-03-04 ENCOUNTER — Encounter: Payer: Self-pay | Admitting: Obstetrics and Gynecology

## 2016-03-04 VITALS — BP 116/74 | HR 100 | Ht 69.5 in | Wt 343.0 lb

## 2016-03-04 DIAGNOSIS — N921 Excessive and frequent menstruation with irregular cycle: Secondary | ICD-10-CM | POA: Diagnosis not present

## 2016-03-04 DIAGNOSIS — D250 Submucous leiomyoma of uterus: Secondary | ICD-10-CM

## 2016-03-04 DIAGNOSIS — N83202 Unspecified ovarian cyst, left side: Secondary | ICD-10-CM | POA: Diagnosis not present

## 2016-03-04 DIAGNOSIS — D72829 Elevated white blood cell count, unspecified: Secondary | ICD-10-CM | POA: Diagnosis not present

## 2016-03-04 LAB — HEMOGLOBIN, FINGERSTICK: HEMOGLOBIN, FINGERSTICK: 11.7 g/dL — AB (ref 12.0–15.0)

## 2016-03-04 NOTE — Progress Notes (Signed)
GYNECOLOGY  VISIT   HPI: 46 y.o.   Single  African American  female   [id number] with No LMP recorded. Patient has had an injection.   here to discuss surgery. Patient still having spotting and bleeding while taking Depo Provera.  Has intermittent bleeding and sometimes clots. Just wants all bleeding to stop.  Sister had hysterectomy for fibroids and bleeding.  Patient wants hysterectomy also.  Last ultrasound/sonohysterogram was 09/05/15: Has intracavitary fibroid - 25 mm. Also hemorrhagic cyst of the left ovary 48 x 47 mm on 09/05/15. EMB was benign.  Depo Provera causing hair loss.  Last injection on 02/18/16.  Taking liquid iron.   Declines future childbearing.   Considering hysterectomy surgery for the end of May 2018.  Facing potential knee replacement surgery in August. Had hip replacement and takes amoxicillin prior to procedures.  HGB: 11.7 POC test.  GYNECOLOGIC HISTORY: No LMP recorded. Patient has had an injection. Contraception:  Abstinence Menopausal hormone therapy:  n/a Last mammogram:  10-21-15 Density A/Neg/biRads1:TBC Last pap smear:   08-24-15 Neg:Neg HR HPv        OB History    Gravida Para Term Preterm AB Living   1 1 1     1    SAB TAB Ectopic Multiple Live Births                     Patient Active Problem List   Diagnosis Date Noted  . Osteoarthritis of left hip 07/10/2011    Past Medical History:  Diagnosis Date  . Arthritis   . GERD (gastroesophageal reflux disease)    occ  . Headache(784.0)    hx migraines    Past Surgical History:  Procedure Laterality Date  . CESAREAN SECTION  2000  . OOPHORECTOMY  2007   Rt.oophorectomy   . OVARIAN CYST SURGERY  ? and 10   rt removed,cyst off lft  . TOTAL HIP ARTHROPLASTY  07/06/2011   Procedure: TOTAL HIP ARTHROPLASTY;  Surgeon: Kerin Salen, MD;  Location: [facility];  Service: Orthopedics;  Laterality: Left;  DEPUY/PINNACLE    Current Outpatient Prescriptions  Medication Sig Dispense Refill  .  acetaminophen (TYLENOL) 325 MG tablet Take 325 mg by mouth every 6 (six) hours as needed. Takes 2-3 tablets prn arthritis    . BLACK CURRANT SEED OIL PO Take by mouth.    . diclofenac (VOLTAREN) 50 MG EC tablet Take 50 mg by mouth 2 (two) times daily.    . diclofenac (VOLTAREN) 75 MG EC tablet Take 1 tablet by mouth as needed.  1  . diclofenac sodium (VOLTAREN) 1 % GEL Apply 1 application topically as needed.  1  . FERROUS SULFATE PO Take by mouth daily.    . methocarbamol (ROBAXIN) 500 MG tablet Take 1 tablet by mouth as needed.  1  . Multiple Vitamin (MULITIVITAMIN WITH MINERALS) TABS Take 1 tablet by mouth daily.    . traMADol (ULTRAM) 50 MG tablet Take 1 tablet by mouth as needed.  5  . TURMERIC PO Take 1 tablet by mouth daily.    . Vitamin D, Ergocalciferol, (DRISDOL) 50000 units CAPS capsule Take 1 capsule by mouth once a week.  0  . medroxyPROGESTERone (DEPO-PROVERA) 150 MG/ML injection Inject 150 mg into the muscle every 3 (three) months.     No current facility-administered medications for this visit.      ALLERGIES: Caffeine; Hydrocodone; and Monosodium glutamate  Family History  Problem Relation Age of Onset  .  Osteoarthritis Mother   . Osteoarthritis Brother   . Hypertension Brother   . Osteoarthritis Maternal Grandmother     Social History   Social History  . Marital status: Single    Spouse name: N/A  . Number of children: N/A  . Years of education: N/A   Occupational History  . Not on file.   Social History Main Topics  . Smoking status: Never Smoker  . Smokeless tobacco: Never Used  . Alcohol use No  . Drug use: No  . Sexual activity: No   Other Topics Concern  . Not on file   Social History Narrative  . No narrative on file    ROS:  Pertinent items are noted in HPI.  PHYSICAL EXAMINATION:    BP 116/74 (BP Location: Right Arm, Patient Position: Sitting, Cuff Size: Large)   Pulse 100   Ht 5' 9.5" (1.765 m)   Wt (!) 343 lb (155.6 kg) Comment:  per patient  BMI 49.93 kg/m     General appearance: alert, cooperative and appears stated age   Abdomen:obese,  soft, non-tender, no masses,  no organomegaly   Pelvic: External genitalia:  no lesions              Urethra:  normal appearing urethra with no masses, tenderness or lesions              Bartholins and Skenes: normal                 Vagina: normal appearing vagina with normal color and discharge, no lesions.  Blood in vaginal vault.               Cervix: no lesions                Bimanual Exam:  Uterus:  normal size, contour, position, consistency, mobility, non-tender              Adnexa: no mass, fullness, tenderness            Chaperone was present for exam.  ASSESSMENT  Submucosal uterine fibroid. Mild chronic anemia. Hemorrhagic left ovarian cyst.  Hx bilateral dermoid ovarian cysts.  Status post right oophorectomy.  Hx Cesarean Section.   PLAN  Discussion of options for care including hysteroscopic myomectomy, Robotic total laparoscopic hysterectomy with bilateral salpingectomy, possible left ovarian cystectomy, cystoscopy, temporary Depo Lupron, uterine artery embolization.  Risks and benefits reviewed. We did discuss that hysteroscopic myomectomy may adequately remove the fibroid but can need to be done with more than one surgery on some occasions.  She understands that if her uterus remains, she may have continued formation of future fibroids.  She is not willing to accept this at this time. Patient would like to pursue robotic hysterectomy.  Will return for pelvic ultrasound to reassess left ovary. Would plan for Lovenox for prophylaxis. ACOG HO on hysterectomy and information about DaVinci surgery to patient as well. CBC today.    An After Visit Summary was printed and given to the patient.  __25____ minutes face to face time of which over 50% was spent in counseling.

## 2016-03-05 ENCOUNTER — Encounter: Payer: Self-pay | Admitting: Obstetrics and Gynecology

## 2016-03-05 LAB — CBC
HEMATOCRIT: 36.4 % (ref 35.0–45.0)
HEMOGLOBIN: 11.6 g/dL — AB (ref 11.7–15.5)
MCH: 25.7 pg — ABNORMAL LOW (ref 27.0–33.0)
MCHC: 31.9 g/dL — AB (ref 32.0–36.0)
MCV: 80.7 fL (ref 80.0–100.0)
MPV: 9.4 fL (ref 7.5–12.5)
Platelets: 344 10*3/uL (ref 140–400)
RBC: 4.51 MIL/uL (ref 3.80–5.10)
RDW: 15.7 % — AB (ref 11.0–15.0)
WBC: 12.4 10*3/uL — AB (ref 3.8–10.8)

## 2016-03-05 NOTE — Addendum Note (Signed)
Addended by: Yisroel Ramming, Dietrich Pates E on: 03/05/2016 08:14 PM   Modules accepted: Orders

## 2016-03-05 NOTE — Addendum Note (Signed)
Addended by: Yisroel Ramming, Dietrich Pates E on: 03/05/2016 08:16 PM   Modules accepted: Orders

## 2016-03-10 ENCOUNTER — Telehealth: Payer: Self-pay | Admitting: Obstetrics and Gynecology

## 2016-03-10 NOTE — Telephone Encounter (Signed)
I would go ahead and proceed with the ultrasound as it will help with surgical planning.  We are rechecking an ovarian cyst and will need to understand if this requires surgical care at the time of her hysterectomy.   Thank you.

## 2016-03-10 NOTE — Telephone Encounter (Signed)
Spoke with patient, in regards to benefits for recommended ultrasound. Patient is agreeable to information provided, but states ultrasound will not need to ben sched

## 2016-03-10 NOTE — Telephone Encounter (Signed)
This is a continuation of previous message. Patient states she will not need to schedule the recommended ultrasound until closer to when she is considering surgery (May).  Advised patient I will follow up with provider to verify this information and call back for scheduling. Patient is agreeable.  Routing to Dr Quincy Simmonds

## 2016-03-16 NOTE — Telephone Encounter (Signed)
Called patient to review benefits for a recommended surgical procedure.Left Voicemail requesting a call back. Upon return call, will also need to scheduled ultrasound, see response from Dr Quincy Simmonds regarding scheduling   cc: Lamont Snowball

## 2016-03-17 ENCOUNTER — Telehealth: Payer: Self-pay | Admitting: Rheumatology

## 2016-03-17 NOTE — Telephone Encounter (Signed)
Called patient to schedule new patient appointment and patient is requesting an afternoon new patient appointment due to her work schedule. Is this ok to do?

## 2016-03-17 NOTE — Telephone Encounter (Signed)
Okay for afternoon appointment per Ivin Booty

## 2016-03-17 NOTE — Telephone Encounter (Signed)
See Previous phone notes. Patient scheduled 03/26/16 with Dr Quincy Simmonds. Patient aware of  Date, arrival time and [employer] policy. No further questions.   Routing to Dr Quincy Simmonds

## 2016-03-18 NOTE — Telephone Encounter (Signed)
Routing to provider for final review. Patient agreeable to disposition. Will close encounter.     

## 2016-03-26 ENCOUNTER — Ambulatory Visit (INDEPENDENT_AMBULATORY_CARE_PROVIDER_SITE_OTHER): Payer: 59 | Admitting: Obstetrics and Gynecology

## 2016-03-26 ENCOUNTER — Encounter: Payer: Self-pay | Admitting: Obstetrics and Gynecology

## 2016-03-26 ENCOUNTER — Ambulatory Visit (INDEPENDENT_AMBULATORY_CARE_PROVIDER_SITE_OTHER): Payer: 59

## 2016-03-26 VITALS — BP 134/78 | HR 70 | Ht 69.5 in | Wt 332.0 lb

## 2016-03-26 DIAGNOSIS — D250 Submucous leiomyoma of uterus: Secondary | ICD-10-CM

## 2016-03-26 DIAGNOSIS — Z862 Personal history of diseases of the blood and blood-forming organs and certain disorders involving the immune mechanism: Secondary | ICD-10-CM

## 2016-03-26 DIAGNOSIS — D259 Leiomyoma of uterus, unspecified: Secondary | ICD-10-CM | POA: Diagnosis not present

## 2016-03-26 DIAGNOSIS — N83202 Unspecified ovarian cyst, left side: Secondary | ICD-10-CM | POA: Diagnosis not present

## 2016-03-26 LAB — IRON: Iron: 44 ug/dL (ref 40–190)

## 2016-03-26 LAB — FERRITIN: Ferritin: 29 ng/mL (ref 10–232)

## 2016-03-26 NOTE — Progress Notes (Signed)
Encounter reviewed by Dr. Brook Amundson C. Silva.  

## 2016-03-26 NOTE — Progress Notes (Signed)
Patient ID: Julia Monroe, female   DOB: November 07, 1970, 46 y.o.   MRN: [id number] GYNECOLOGY  VISIT   HPI: 46 y.o.   Single  African American  female   [id number] with No LMP recorded. Patient has had an injection.   here for pelvic ultrasound to reassess left ovary.    Prior ultrasound showed hemorrhagic cyst.   Hgb at work 12.2.  Just not feeling right.  Develops joint pain following flu vaccines each year.  Had elevated sed rate through PCP at [facility].  Sees Dr. Corena Pilgrim in April for evaluation for joint pain and eval to rule out lupus.   Daughter graduating in May 2018.  Patient considering hysterectomy surgery in June 2018 for symptomatic fibroid. Has declined future childbearing.   GYNECOLOGIC HISTORY: No LMP recorded. Patient has had an injection. Contraception:  Depo Provera Menopausal hormone therapy:  n/a Last mammogram: 10-21-15 Density A/Neg/biRads1:TBC   Last pap smear:  08-24-15 Neg:Neg HR HPV; 09-05-12 Neg:Neg HR HPV        OB History    Gravida Para Term Preterm AB Living   1 1 1     1    SAB TAB Ectopic Multiple Live Births                     Patient Active Problem List   Diagnosis Date Noted  . Osteoarthritis of left hip 07/10/2011    Past Medical History:  Diagnosis Date  . Arthritis   . GERD (gastroesophageal reflux disease)    occ  . Headache(784.0)    hx migraines    Past Surgical History:  Procedure Laterality Date  . CESAREAN SECTION  2000  . OOPHORECTOMY  2007   Rt.oophorectomy   . OVARIAN CYST SURGERY  ? and 10   rt removed,cyst off lft  . TOTAL HIP ARTHROPLASTY  07/06/2011   Procedure: TOTAL HIP ARTHROPLASTY;  Surgeon: Kerin Salen, MD;  Location: [facility];  Service: Orthopedics;  Laterality: Left;  DEPUY/PINNACLE    Current Outpatient Prescriptions  Medication Sig Dispense Refill  . acetaminophen (TYLENOL) 325 MG tablet Take 325 mg by mouth every 6 (six) hours as needed. Takes 2-3 tablets prn arthritis    . BLACK CURRANT SEED OIL PO  Take by mouth.    . diclofenac (VOLTAREN) 50 MG EC tablet Take 50 mg by mouth 2 (two) times daily.    . diclofenac (VOLTAREN) 75 MG EC tablet Take 1 tablet by mouth as needed.  1  . diclofenac sodium (VOLTAREN) 1 % GEL Apply 1 application topically as needed.  1  . Iron 15 MG/1.5ML SUSP Take by mouth. Takes once daily    . medroxyPROGESTERone (DEPO-PROVERA) 150 MG/ML injection Inject 150 mg into the muscle every 3 (three) months.    . methocarbamol (ROBAXIN) 500 MG tablet Take 1 tablet by mouth as needed.  1  . Multiple Vitamin (MULITIVITAMIN WITH MINERALS) TABS Take 1 tablet by mouth daily.    . traMADol (ULTRAM) 50 MG tablet Take 1 tablet by mouth as needed.  5  . TURMERIC PO Take 1 tablet by mouth daily.    . Vitamin D, Ergocalciferol, (DRISDOL) 50000 units CAPS capsule Take 1 capsule by mouth once a week.  0   No current facility-administered medications for this visit.      ALLERGIES: Caffeine; Hydrocodone; and Monosodium glutamate  Family History  Problem Relation Age of Onset  . Osteoarthritis Mother   . Osteoarthritis Brother   . Hypertension Brother   .  Osteoarthritis Maternal Grandmother     Social History   Social History  . Marital status: Single    Spouse name: N/A  . Number of children: N/A  . Years of education: N/A   Occupational History  . Not on file.   Social History Main Topics  . Smoking status: Never Smoker  . Smokeless tobacco: Never Used  . Alcohol use No  . Drug use: No  . Sexual activity: No   Other Topics Concern  . Not on file   Social History Narrative  . No narrative on file    ROS:  Pertinent items are noted in HPI.  PHYSICAL EXAMINATION:    BP 134/78 (BP Location: Right Arm, Patient Position: Sitting, Cuff Size: Large)   Pulse 70   Ht 5' 9.5" (1.765 m)   Wt (!) 332 lb (150.6 kg)   BMI 48.32 kg/m     General appearance: alert, cooperative and appears stated age  Pelvic ultrasound: Uterus with submucous myoma 30 x 26 mm.   Has appeared to be transmural on prior ultrasound. EMS10.33 mm. Right ovary absent.  Left ovary with no cyst formation.  No free fluid.   ASSESSMENT  Uterine fibroid.  Left ovarian hemorrhagic cyst resolved.  Status post right salpingo-ophorectomy for dermoid. Status post left ovarian cystectomy for dermoid. Joint pain. Undetermined etiology. Hx Cesarean Section.  PLAN  Discussion of resolved ovarian cyst.  Will proceed forward with robotic total laparoscopic hysterectomy and left salpingectomy in June.  We discussed the risks and benefits of removal of the remaining ovary.  Her ovary protects her from cardiovascular disease and osteoporosis.  If it is normal at the time of surgery, we agree that it will not be removed electively.    An After Visit Summary was printed and given to the patient.  ___25___ minutes face to face time of which over 50% was spent in counseling.

## 2016-03-28 ENCOUNTER — Encounter: Payer: Self-pay | Admitting: Obstetrics and Gynecology

## 2016-03-31 ENCOUNTER — Encounter: Payer: Self-pay | Admitting: Obstetrics and Gynecology

## 2016-03-31 DIAGNOSIS — D72829 Elevated white blood cell count, unspecified: Secondary | ICD-10-CM

## 2016-04-09 ENCOUNTER — Telehealth: Payer: Self-pay | Admitting: Obstetrics and Gynecology

## 2016-04-09 NOTE — Telephone Encounter (Signed)
Spoke with patient regarding benefit for surgery. Patient understood and agreeable. Patient states she is looking to schedule in June 2018. Patient advises she will call back to confirm and proceed with scheduling. atient aware this is professional benefit only. Patient aware will be contacted by hospital for separate benefits.   Routing to [employer]

## 2016-04-21 NOTE — Telephone Encounter (Signed)
Call to patient. Discussed surgical date options for end of may and early June. Patient requests early June due to daughters graduation date. Advised will work on date scheduling and call back once scheduled to confirm.

## 2016-04-21 NOTE — Telephone Encounter (Signed)
Patient called to provide surgery pre-payment and to advised she is ready to proceed with scheduling recommended surgery.  Advised patient she will be contacted by our nurse supervisor for scheduling. Forwarding to nurse supervisor for scheduling.  Routing to [employer]

## 2016-04-22 NOTE — Telephone Encounter (Signed)
Call to patient. Advised surgery scheduled for Monday 07-06-16 at 0730 at [facility]. Patient agreeable. Surgery instruction sheet reviewed and printed copy will be mailed. Patient is scheduled to see Dr Estanislado Pandy next week and will provide update following appointment.   Routing to provider for final review. Patient agreeable to disposition. Will close encounter.

## 2016-04-26 NOTE — Progress Notes (Signed)
Office Visit Note  Patient: Julia Monroe             Date of Birth: November 21, 1970           MRN: [id number]             PCP: Antony Blackbird, MD Referring: Aura Dials, MD Visit Date: 04/29/2016 Occupation: Phlebotomist    Subjective:  Right hip and left knee pain.Marland Kitchen   History of Present Illness: Julia Monroe is a 46 y.o. female seen in consultation per request of her PCP. According to patient in 2010 she had an intentional weight loss by exercising. At the time she started having pain and discomfort in her multiple joints. In 2012 she was seen by me and was diagnosed with osteoarthritis of her hip joints and knee joints. She was referred to Dr. Mayer Camel and underwent left total hip replacement in 2013. She states since then gradually her joints have been getting worse and she has progressive pain. She describes pain in her left shoulder joint, right elbow joint, right hip joint and left knee joint. She denies any joint swelling. She states she had flu shot in October 2017 and after that she developed severe pain in her joints. She was seen by her PCP recently and had lab work and had positive ANA. For that reason she was referred back to me.  Activities of Daily Living:  Patient reports morning stiffness for al day hours.   Patient Reports nocturnal pain.  Difficulty dressing/grooming: Denies Difficulty climbing stairs: Reports Difficulty getting out of chair: Reports Difficulty using hands for taps, buttons, cutlery, and/or writing: Denies   Review of Systems  Constitutional: Negative for fatigue, night sweats, weight gain, weight loss and weakness.  HENT: Negative for mouth sores, trouble swallowing, trouble swallowing, mouth dryness and nose dryness.   Eyes: Negative for pain, redness, visual disturbance and dryness.  Respiratory: Negative for cough, shortness of breath and difficulty breathing.   Cardiovascular: Negative for chest pain, palpitations, hypertension,  irregular heartbeat and swelling in legs/feet.  Gastrointestinal: Negative for blood in stool, constipation and diarrhea.  Endocrine: Negative for increased urination.  Genitourinary: Negative for vaginal dryness.  Musculoskeletal: Positive for arthralgias, joint pain and morning stiffness. Negative for joint swelling, myalgias, muscle weakness, muscle tenderness and myalgias.  Skin: Negative for color change, rash, hair loss, skin tightness, ulcers and sensitivity to sunlight.  Allergic/Immunologic: Negative for susceptible to infections.  Neurological: Negative for dizziness, memory loss and night sweats.  Hematological: Negative for swollen glands.  Psychiatric/Behavioral: Negative for depressed mood. The patient is not nervous/anxious.     PMFS History:  Patient Active Problem List   Diagnosis Date Noted  . Primary osteoarthritis of both knees 04/29/2016  . ANA positive 04/29/2016  . Osteoarthritis of left hip 07/10/2011    Past Medical History:  Diagnosis Date  . Arthritis   . GERD (gastroesophageal reflux disease)    occ  . Headache(784.0)    hx migraines    Family History  Problem Relation Age of Onset  . Osteoarthritis Mother   . Osteoarthritis Brother   . Hypertension Brother   . Osteoarthritis Maternal Grandmother    Past Surgical History:  Procedure Laterality Date  . CESAREAN SECTION  2000  . JOINT REPLACEMENT  L   Left hip replacement  . OOPHORECTOMY  2007   Rt.oophorectomy   . OVARIAN CYST SURGERY  ? and 10   rt removed,cyst off lft  . TOTAL HIP ARTHROPLASTY  07/06/2011  Procedure: TOTAL HIP ARTHROPLASTY;  Surgeon: Kerin Salen, MD;  Location: [facility];  Service: Orthopedics;  Laterality: Left;  DEPUY/PINNACLE   Social History   Social History Narrative  . No narrative on file     Objective: Vital Signs: BP (!) 134/96   Pulse 82   Resp 16   Ht _0  (1.753 m)   Wt (!) 344 lb (156 kg)   LMP 04/29/2016 Comment: Patient is going to have full  hysterectomy in June  BMI 50.80 kg/m    Physical Exam  Constitutional: She is oriented to person, place, and time. She appears well-developed and well-nourished.  HENT:  Head: Normocephalic and atraumatic.  Eyes: Conjunctivae and EOM are normal.  Neck: Normal range of motion.  Cardiovascular: Normal rate, regular rhythm, normal heart sounds and intact distal pulses.   Pulmonary/Chest: Effort normal and breath sounds normal.  Abdominal: Soft. Bowel sounds are normal.  Lymphadenopathy:    She has no cervical adenopathy.  Neurological: She is alert and oriented to person, place, and time.  Skin: Skin is warm and dry. Capillary refill takes less than 2 seconds.  Psychiatric: She has a normal mood and affect. Her behavior is normal.  Nursing note and vitals reviewed.    Musculoskeletal Exam: Fine thoracic lumbar spine good range of motion. She had painful range of motion of her left shoulder joint. She is tenderness on palpation over right medial epicondyle. Right hip joint has decreased range of motion with discomfort. Left hip joint was good range of motion. She had discomfort with range of motion of bilateral knee joints without any warmth swelling or effusion. Ankle joints MTPs PIPs with good range of motion.  CDAI Exam: No CDAI exam completed.    Investigation: Findings:  11/27/2015 RF less than 10, ESR 32, uric acid 4.0, ANA positive, Lyme antibody negative    Imaging: No results found.  Speciality Comments: No specialty comments available.    Procedures:  No procedures performed Allergies: Monosodium glutamate; Caffeine; Hydrocodone; and Oxycodone   Assessment / Plan:     Visit Diagnoses: ANA positive -patient states that she has intermittent discomfort and swelling in her joints. She's concerned about positive ANA. I'll obtain following labs to evaluate this further. I do not see any clinical features of autoimmune disease on examination. Plan: COMPLETE METABOLIC PANEL  WITH GFR, ANA, [id number] ENA PANEL, Cardiolipin antibodies, IgG, IgM, IgA, Beta-2 glycoprotein antibodies, C3 and C4, Lupus anticoagulant panel  Pain of right hip joint: She has decreased range of motion which is painful. She states she seen by Dr. Mayer Camel was done x-rays of her right hip joint and discussed total hip replacement with her.  History of total hip replacement, left: Doing well  Primary osteoarthritis of both knees: According to patient the x-rays at Dr. Damita Dunnings office revealed osteoarthritis. She is aware of weight loss diet and exercise.  Chronic left shoulder pain: I offered x-rays but patient declined.  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 I've advised her to contact us location he has ongoing problems.   Orders: Orders Placed This Encounter  Procedures  . COMPLETE METABOLIC PANEL WITH GFR  . ANA  . [id number] ENA PANEL  . Cardiolipin antibodies, IgG, IgM, IgA  . Beta-2 glycoprotein antibodies  . C3 and C4  . Lupus anticoagulant panel   No orders  of the defined types were placed in this encounter.   Face-to-face time spent with patient was 45 minutes. 50% of time was spent in counseling and coordination of care.  Follow-Up Instructions: No Follow-up on file.   Bo Merino, MD  Note - This record has been created using Editor, commissioning.  Chart creation errors have been sought, but may not always  have been located. Such creation errors do not reflect on  the standard of medical care.

## 2016-04-29 ENCOUNTER — Encounter: Payer: Self-pay | Admitting: Rheumatology

## 2016-04-29 ENCOUNTER — Ambulatory Visit (INDEPENDENT_AMBULATORY_CARE_PROVIDER_SITE_OTHER): Payer: 59 | Admitting: Rheumatology

## 2016-04-29 VITALS — BP 134/96 | HR 82 | Resp 16 | Ht 69.0 in | Wt 344.0 lb

## 2016-04-29 DIAGNOSIS — R768 Other specified abnormal immunological findings in serum: Secondary | ICD-10-CM | POA: Diagnosis not present

## 2016-04-29 DIAGNOSIS — M7701 Medial epicondylitis, right elbow: Secondary | ICD-10-CM

## 2016-04-29 DIAGNOSIS — Z96642 Presence of left artificial hip joint: Secondary | ICD-10-CM | POA: Diagnosis not present

## 2016-04-29 DIAGNOSIS — G8929 Other chronic pain: Secondary | ICD-10-CM

## 2016-04-29 DIAGNOSIS — M170 Bilateral primary osteoarthritis of knee: Secondary | ICD-10-CM | POA: Diagnosis not present

## 2016-04-29 DIAGNOSIS — M25551 Pain in right hip: Secondary | ICD-10-CM

## 2016-04-29 DIAGNOSIS — M25512 Pain in left shoulder: Secondary | ICD-10-CM

## 2016-04-29 LAB — COMPLETE METABOLIC PANEL WITH GFR
ALT: 11 U/L (ref 6–29)
AST: 14 U/L (ref 10–30)
Albumin: 3.8 g/dL (ref 3.6–5.1)
Alkaline Phosphatase: 62 U/L (ref 33–115)
BILIRUBIN TOTAL: 0.2 mg/dL (ref 0.2–1.2)
BUN: 18 mg/dL (ref 7–25)
CHLORIDE: 107 mmol/L (ref 98–110)
CO2: 23 mmol/L (ref 20–31)
Calcium: 9 mg/dL (ref 8.6–10.2)
Creat: 0.91 mg/dL (ref 0.50–1.10)
GFR, EST AFRICAN AMERICAN: 89 mL/min (ref >=60)
GFR, EST NON AFRICAN AMERICAN: 77 mL/min (ref >=60)
Glucose, Bld: 93 mg/dL (ref 65–99)
Potassium: 4 mmol/L (ref 3.5–5.3)
Sodium: 140 mmol/L (ref 135–146)
Total Protein: 7.1 g/dL (ref 6.1–8.1)

## 2016-04-29 NOTE — Addendum Note (Signed)
Addended byCandice Camp on: 04/29/2016 04:54 PM   Modules accepted: Orders

## 2016-04-30 LAB — CP5000020 ENA PANEL
DS DNA AB: 17 [IU]/mL — AB
ENA SM Ab Ser-aCnc: <1
Ribonucleic Protein(ENA) Antibody, IgG: <1
SCLERODERMA (SCL-70) (ENA) ANTIBODY, IGG: NEGATIVE
SSA (RO) (ENA) ANTIBODY, IGG: NEGATIVE
SSB (LA) (ENA) ANTIBODY, IGG: NEGATIVE

## 2016-04-30 LAB — CARDIOLIPIN ANTIBODIES, IGG, IGM, IGA
Anticardiolipin IgA: <11 [APL'U]
Anticardiolipin IgG: <14 [GPL'U]

## 2016-04-30 LAB — ANA: Anti Nuclear Antibody(ANA): NEGATIVE

## 2016-05-01 LAB — RFX DRVVT SCR W/RFLX CONF 1:1 MIX: DRVVT SCREEN: 39 s (ref <=45)

## 2016-05-01 LAB — RFX PTT-LA W/RFX TO HEX PHASE CONF: PTT-LA Screen: 41 s — ABNORMAL HIGH (ref <=40)

## 2016-05-01 LAB — BETA-2 GLYCOPROTEIN ANTIBODIES
Beta-2 Glyco I IgG: 9 SGU (ref <=20)
Beta-2-Glycoprotein I IgM: <9 SMU (ref <=20)

## 2016-05-01 LAB — LUPUS ANTICOAGULANT PANEL

## 2016-05-01 LAB — C3 AND C4
C3 COMPLEMENT: 134 mg/dL (ref 83–193)
C4 Complement: 40 mg/dL (ref 15–57)

## 2016-05-01 LAB — RFLX HEXAGONAL PHASE CONFIRM: HEXAGONAL PHASE CONFIRM: NEGATIVE

## 2016-05-03 NOTE — Progress Notes (Signed)
Her dsDNA is positive. I would recommend getting ultrasound of bilateral hands to look for synovitis. He is a scheduled appointment if patient is in agreement.

## 2016-05-05 ENCOUNTER — Ambulatory Visit: Payer: 59

## 2016-05-14 ENCOUNTER — Telehealth: Payer: Self-pay | Admitting: Rheumatology

## 2016-05-14 NOTE — Telephone Encounter (Signed)
Patient left a message stating that she needs to know if she is okay with her lab results to have her hysterectomy.  Please call patient at [phone].  Thank you.

## 2016-05-14 NOTE — Telephone Encounter (Signed)
Patient states she needs to know about her new patient lab results and if they would hinder her from being able to have her hysterectomy. Please advise.

## 2016-05-15 NOTE — Telephone Encounter (Signed)
Patient's labs are consistent with autoimmune disease. Although I do not see any  contraindication to hysterectomy.

## 2016-05-15 NOTE — Telephone Encounter (Signed)
Thank you I have called to advise.  

## 2016-05-24 NOTE — Progress Notes (Signed)
Office Visit Note  Patient: Julia Monroe             Date of Birth: [birth year]           MRN: [id number]             PCP: Antony Blackbird, MD Referring: Antony Blackbird, MD Visit Date: 05/27/2016 Occupation: '@GUAROCC'$ @    Subjective:  Pain hips and knee   History of Present Illness: Julia Monroe is a 46 y.o. female with history of positive ANA. She returns a follow-up visit today she states she's been having pain and discomfort in her shoulders hip joints and knee joints. She has arthritis in her hands but it does not cause discomfort. She denies any joint swelling. Dr. Mayer Camel has discussed  left total knee replacement and right total hip replacement with her. She is scheduled to have hysterectomy prior to that.  Activities of Daily Living:  Patient reports morning stiffness for 0 minutes.   Patient Reports nocturnal pain.  Difficulty dressing/grooming: Denies Difficulty climbing stairs: Denies Difficulty getting out of chair: Reports Difficulty using hands for taps, buttons, cutlery, and/or writing: Denies   Review of Systems  Constitutional: Positive for fatigue. Negative for night sweats, weight gain, weight loss and weakness.  HENT: Negative for mouth sores, trouble swallowing, trouble swallowing, mouth dryness and nose dryness.   Eyes: Negative for pain, redness, visual disturbance and dryness.  Respiratory: Negative for cough, shortness of breath and difficulty breathing.   Cardiovascular: Negative for chest pain, palpitations, hypertension, irregular heartbeat and swelling in legs/feet.  Gastrointestinal: Negative for blood in stool, constipation and diarrhea.  Endocrine: Negative for increased urination.  Genitourinary: Negative for vaginal dryness.  Musculoskeletal: Positive for arthralgias and joint pain. Negative for joint swelling, myalgias, muscle weakness, morning stiffness, muscle tenderness and myalgias.  Skin: Negative for color change, rash, hair loss,  skin tightness, ulcers and sensitivity to sunlight.  Allergic/Immunologic: Negative for susceptible to infections.  Neurological: Negative for dizziness, memory loss and night sweats.  Hematological: Negative for swollen glands.  Psychiatric/Behavioral: Negative for depressed mood and sleep disturbance. The patient is not nervous/anxious.     PMFS History:  Patient Active Problem List   Diagnosis Date Noted  . Primary osteoarthritis of both knees 04/29/2016  . ANA positive 04/29/2016  . Osteoarthritis of left hip 07/10/2011    Past Medical History:  Diagnosis Date  . Arthritis   . GERD (gastroesophageal reflux disease)    occ  . Headache(784.0)    hx migraines    Family History  Problem Relation Age of Onset  . Osteoarthritis Mother   . Osteoarthritis Brother   . Hypertension Brother   . Osteoarthritis Maternal Grandmother    Past Surgical History:  Procedure Laterality Date  . CESAREAN SECTION  2000  . JOINT REPLACEMENT  L   Left hip replacement  . OOPHORECTOMY  2007   Rt.oophorectomy   . OVARIAN CYST SURGERY  ? and 10   rt removed,cyst off lft  . TOTAL HIP ARTHROPLASTY  07/06/2011   Procedure: TOTAL HIP ARTHROPLASTY;  Surgeon: Kerin Salen, MD;  Location: [facility];  Service: Orthopedics;  Laterality: Left;  DEPUY/PINNACLE   Social History   Social History Narrative  . No narrative on file     Objective: Vital Signs: BP (!) 147/76 (BP Location: Left Arm, Patient Position: Sitting, Cuff Size: Large)   Pulse 87   Resp 15   Ht 5' 9.5" (1.765 m)   Wt (!) 344 lb (  156 kg)   LMP 04/29/2016 Comment: Patient is going to have full hysterectomy in June  BMI 50.07 kg/m    Physical Exam  Constitutional: She is oriented to person, place, and time. She appears well-developed and well-nourished.  HENT:  Head: Normocephalic and atraumatic.  Eyes: Conjunctivae and EOM are normal.  Neck: Normal range of motion.  Cardiovascular: Normal rate, regular rhythm, normal heart  sounds and intact distal pulses.   Pulmonary/Chest: Effort normal and breath sounds normal.  Abdominal: Soft. Bowel sounds are normal.  Lymphadenopathy:    She has no cervical adenopathy.  Neurological: She is alert and oriented to person, place, and time.  Skin: Skin is warm and dry. Capillary refill takes less than 2 seconds.  Psychiatric: She has a normal mood and affect. Her behavior is normal.  Nursing note and vitals reviewed.    Musculoskeletal Exam: C-spine and thoracic lumbar spine good range of motion. Shoulder joints good range of motion. She has minimal discomfort with range of motion of her left shoulder all the joints wrist joint MCPs PIPs DIPs with good range of motion with no synovitis. She had left hip joint limited range of motion which is replaced. Right hip joint was good range of motion with some some discomfort. She is painful range of motion of her left knee joint without any warmth swelling or effusion. Ankle joints are good range of motion with no synovitis.  CDAI Exam: No CDAI exam completed.    Investigation: Findings:  04/29/2016 CMP normal, DS DNA 17 positive, RNP, SCL 70, SSA, SSB negative, anticardiolipin negative, beta-2 negative, lupus anticoagulant negative, ANA negative, C3-C4 normal    Imaging: No results found.  Speciality Comments: No specialty comments available.    Procedures:  No procedures performed Allergies: Monosodium glutamate; Caffeine; Hydrocodone; and Oxycodone   Assessment / Plan:     Visit Diagnoses: ANA positive - DsDNA positive, arthralgias. Patient has discomfort mostly in her hip joints and knee joints. She has no synovitis on examination. We had detailed discussion regarding symptoms of autoimmune disease. At this point she has no clinical features of autoimmune disease. I made her aware in case she develops new symptoms she supposed to notify us. Otherwise I'll see her back in one year. I plan dsDNA ESR C3-C4 and UA at next  visit  Primary osteoarthritis of both knees: She has severe osteoarthritis of her left knee joint and is a schedule to have totally replacement in future.  History of total hip replacement, left: She has limited range of motion but not much discomfort. She has right hip joint osteoarthritis and plan total hip replacement in future.  Medial epicondylitis of left elbow : The symptoms have improved.   Orders: No orders of the defined types were placed in this encounter.  No orders of the defined types were placed in this encounter.   Face-to-face time spent with patient was 20 minutes. 50% of time was spent in counseling and coordination of care.  Follow-Up Instructions: Return in about 1 year (around 05/27/2017) for +ANA, +dsDNA.   Bo Merino, MD  Note - This record has been created using Editor, commissioning.  Chart creation errors have been sought, but may not always  have been located. Such creation errors do not reflect on  the standard of medical care.

## 2016-05-26 ENCOUNTER — Telehealth: Payer: Self-pay | Admitting: Obstetrics and Gynecology

## 2016-05-26 NOTE — Telephone Encounter (Signed)
Patient is asking if we have received her FMLA forms from [employer]?

## 2016-05-26 NOTE — Telephone Encounter (Signed)
Returned call to patient to advised we have received FMLA Forms from [employer]. Advised patient to allow seven to ten days to process, also advised of administrative fee for processing forms. Patient understood information presented and is agreeable.    Routing to Dr Quincy Simmonds

## 2016-05-27 ENCOUNTER — Ambulatory Visit (INDEPENDENT_AMBULATORY_CARE_PROVIDER_SITE_OTHER): Payer: 59 | Admitting: Rheumatology

## 2016-05-27 ENCOUNTER — Encounter: Payer: Self-pay | Admitting: Rheumatology

## 2016-05-27 VITALS — BP 147/76 | HR 87 | Resp 15 | Ht 69.5 in | Wt 344.0 lb

## 2016-05-27 DIAGNOSIS — M7702 Medial epicondylitis, left elbow: Secondary | ICD-10-CM

## 2016-05-27 DIAGNOSIS — M170 Bilateral primary osteoarthritis of knee: Secondary | ICD-10-CM | POA: Diagnosis not present

## 2016-05-27 DIAGNOSIS — Z96642 Presence of left artificial hip joint: Secondary | ICD-10-CM | POA: Diagnosis not present

## 2016-05-27 DIAGNOSIS — R768 Other specified abnormal immunological findings in serum: Secondary | ICD-10-CM

## 2016-05-27 NOTE — Telephone Encounter (Signed)
I do not have FL[employer] forms for the patient.  You may close the encounter.

## 2016-05-29 ENCOUNTER — Telehealth: Payer: Self-pay | Admitting: Obstetrics and Gynecology

## 2016-05-29 NOTE — Telephone Encounter (Signed)
Forms completed and signed by Dr Quincy Simmonds. Call to patient to advised forms have been completed. Once processing agreement has been received, forms will be faxed to [employer], per patients request.  Routing to Dr Quincy Simmonds

## 2016-05-29 NOTE — Telephone Encounter (Signed)
[employer] forms have been forwarded to Dr Quincy Simmonds for review.

## 2016-05-29 NOTE — Telephone Encounter (Signed)
I signed the FMLA forms.  Thank you for assisting with this.

## 2016-06-02 NOTE — Telephone Encounter (Signed)
Patient provided processing agreement. FMLA forms have been faxed to [employer] on 06/02/16. Fax confirmation received indicating fax transmittal was successful. OK to close

## 2016-06-15 DIAGNOSIS — Z0289 Encounter for other administrative examinations: Secondary | ICD-10-CM

## 2016-06-17 ENCOUNTER — Ambulatory Visit: Payer: 59 | Admitting: Obstetrics and Gynecology

## 2016-06-18 ENCOUNTER — Telehealth: Payer: Self-pay | Admitting: Obstetrics and Gynecology

## 2016-06-18 ENCOUNTER — Ambulatory Visit (INDEPENDENT_AMBULATORY_CARE_PROVIDER_SITE_OTHER): Payer: 59 | Admitting: Obstetrics and Gynecology

## 2016-06-18 ENCOUNTER — Encounter: Payer: Self-pay | Admitting: Obstetrics and Gynecology

## 2016-06-18 VITALS — BP 116/68 | HR 80 | Resp 16 | Wt 346.0 lb

## 2016-06-18 DIAGNOSIS — D259 Leiomyoma of uterus, unspecified: Secondary | ICD-10-CM | POA: Diagnosis not present

## 2016-06-18 NOTE — Progress Notes (Addendum)
GYNECOLOGY  VISIT   HPI: 46 y.o.   Single  African American  female   [id number] with No LMP recorded. Patient is not currently having periods (Reason: Irregular Periods).   here for surgery consult.  Planning hysterectomy for uterine fibroid.    Pelvic ultrasound on 03/26/16: Uterus with submucous myoma 30 x 26 mm.  Has appeared to be transmural on prior ultrasound. EMS10.33 mm. Right ovary absent.  Left ovary with no cyst formation.  No free fluid.  EMB on 09/05/15:  Interval phase endometrium with focal breakdown, negative for atypia, hyperplasia or malignancy.  Bleeding every day, spotting. It is not heavy. No real cramping but has some pelvic aching.   Has not had Depo Provera since February 18, 2016.  Just keep bleeding with spotting and clotting.  Also experienced hair loss.  Declines future childbearing.  Saw Rheumatology for arthritis.  Positive ANA and positive DS DNA.  No lupus.   Has some reflux/gastritis type symptoms with use of Aleve and Advil. No hx of PUD.  Does well with Tramadol.  Does abx prophylaxis due to hip replacement.  Uses Amoxicillin. Was planning on a tatoo but we talked about her waiting to do this after surgery.  Strong FH of fibroids.  GYNECOLOGIC HISTORY: No LMP recorded. Patient is not currently having periods (Reason: Irregular Periods). Contraception: none/abstinence  Menopausal hormone therapy:  n/a Last mammogram:  10-21-15 Density A/Neg/biRads1:TBC   Last pap smear:   08-24-15 Neg:Neg HR HPV; 09-05-12 Neg:Neg HR HPV        OB History    Gravida Para Term Preterm AB Living   1 1 1     1    SAB TAB Ectopic Multiple Live Births                     Patient Active Problem List   Diagnosis Date Noted  . Primary osteoarthritis of both knees 04/29/2016  . ANA positive 04/29/2016  . Osteoarthritis of left hip 07/10/2011    Past Medical History:  Diagnosis Date  . Arthritis   . GERD (gastroesophageal reflux disease)    occ  .  Headache(784.0)    hx migraines    Past Surgical History:  Procedure Laterality Date  . CESAREAN SECTION  2000  . JOINT REPLACEMENT  L   Left hip replacement  . OOPHORECTOMY  2007   Rt.oophorectomy   . OVARIAN CYST SURGERY  ? and 10   rt removed,cyst off lft  . TOTAL HIP ARTHROPLASTY  07/06/2011   Procedure: TOTAL HIP ARTHROPLASTY;  Surgeon: Kerin Salen, MD;  Location: [facility];  Service: Orthopedics;  Laterality: Left;  DEPUY/PINNACLE    Current Outpatient Prescriptions  Medication Sig Dispense Refill  . Diclofenac Potassium (CAMBIA) 50 MG PACK Take 50 mg by mouth daily as needed (for migraine/headaches.). MIX THE CONTENTS OF A PACKET IN 1-2 OZ OF WATER FOR MIGRAINE HEADACHE RELIEF.    Marland Kitchen diclofenac sodium (VOLTAREN) 1 % GEL Apply 1 application topically 4 (four) times daily as needed (for pain.).   1  . IRON PO Take 10 mLs by mouth daily. LIQUID IRON (Ferric Glycinate) 18 mg per 10 ml    . methocarbamol (ROBAXIN) 500 MG tablet Take 500 mg by mouth every 8 (eight) hours as needed for muscle spasms.   1  . Tetrahydrozoline HCl (VISINE OP) Place 1 drop into both eyes 3 (three) times daily as needed (for irritated/dry eyes.).    Marland Kitchen traMADol (ULTRAM) 50  MG tablet Take 100 mg by mouth daily as needed (for pain.).    Marland Kitchen TURMERIC PO Take 1 capsule by mouth daily.      No current facility-administered medications for this visit.      ALLERGIES: Monosodium glutamate; Lactose intolerance (gi); Caffeine; Hydrocodone; and Oxycodone  Family History  Problem Relation Age of Onset  . Osteoarthritis Mother   . Osteoarthritis Brother   . Hypertension Brother   . Osteoarthritis Maternal Grandmother     Social History   Social History  . Marital status: Single    Spouse name: N/A  . Number of children: N/A  . Years of education: N/A   Occupational History  . Not on file.   Social History Main Topics  . Smoking status: Never Smoker  . Smokeless tobacco: Never Used  . Alcohol use No  .  Drug use: No  . Sexual activity: No   Other Topics Concern  . Not on file   Social History Narrative  . No narrative on file    ROS:  Pertinent items are noted in HPI.  PHYSICAL EXAMINATION:    BP 116/68 (BP Location: Right Arm, Patient Position: Sitting, Cuff Size: Normal)   Pulse 80   Resp 16   Wt (!) 346 lb (156.9 kg)   BMI 50.36 kg/m     General appearance: alert, cooperative and appears stated age Head: Normocephalic, without obvious abnormality, atraumatic Neck: no adenopathy, supple, symmetrical, trachea midline and thyroid normal to inspection and palpation Lungs: clear to auscultation bilaterally Heart: regular rate and rhythm Abdomen: obese with pannus, soft, non-tender, no masses,  no organomegaly Extremities: extremities normal, atraumatic, no cyanosis or edema Skin: Skin color, texture, turgor normal. No rashes or lesions Lymph nodes: Cervical, supraclavicular, and axillary nodes normal. No abnormal inguinal nodes palpated Neurologic: Grossly normal  Pelvic: External genitalia:  no lesions              Urethra:  normal appearing urethra with no masses, tenderness or lesions              Bartholins and Skenes: normal                 Vagina: normal appearing vagina with normal color and discharge, no lesions              Cervix: no lesions                Bimanual Exam:  Uterus:  normal size, contour, position, consistency, mobility, non-tender              Adnexa: no mass, fullness, tenderness   Chaperone was present for exam.  ASSESSMENT  Symptomatic submucous vs transmural uterine fibroid.  Status post right salpingo-ophorectomy for dermoid. Status post left ovarian cystectomy for dermoid. Arthritis. Hx Cesarean Section. Reflux/gastritis with Aleve and Advil.  PLAN  I discussed robotic hysterectomy with bilateral salpingo-oophorectomy.  I reviewed risks, benefits, and alternatives.  Risks include but are not limited to bleeding, infection, damage to  surrounding organs, pneumonia, reaction to anesthesia, DVT, PE, death, need for reoperation, hernia formation, neuropathy, and vaginal cuff dehiscence. Surgical expectations and recovery discussed.  Patient would like to be discharged home same day if possible.  Will plan for Tramadol for post op pain.   An After Visit Summary was printed and given to the patient.  __25____ minutes face to face time of which over 50% was spent in counseling.   Addendum:  Patient will have a  robotic total laparoscopic hysterectomy with left salpingectomy, and cystoscopy.

## 2016-06-18 NOTE — Telephone Encounter (Signed)
[employer] Dis[employer] form has been forwarded to Dr Quincy Simmonds for review.  Routing to Dr Quincy Simmonds

## 2016-06-19 ENCOUNTER — Other Ambulatory Visit (HOSPITAL_COMMUNITY): Payer: 59

## 2016-06-19 NOTE — Telephone Encounter (Signed)
I have signed her [employer] Disability form.

## 2016-06-23 NOTE — Patient Instructions (Signed)
Your procedure is scheduled on:  Monday, July 06, 2016  Enter through the [employer] of [facility] at:  6:00AM  Pick up the phone at the desk and dial [phone].  Call this number if you have problems the morning of surgery: [phone].  Remember: Do NOT eat food or drink after:  Midnight Sunday  Take these medicines the morning of surgery with a SIP OF WATER:  None  Stop ALL herbal medications and Turmeric at this time  Do NOT smoke the day of surgery.  Do NOT wear jewelry (body piercing), metal hair clips/bobby pins, make-up, or nail polish. Do NOT wear lotions, powders, or perfumes.  You may wear deodorant. Do NOT shave for 48 hours prior to surgery. Do NOT bring valuables to the hospital. Contacts, dentures, or bridgework may not be worn into surgery.  Leave suitcase in car.  After surgery it may be brought to your room.  For patients admitted to the hospital, checkout time is 11:00 AM the day of discharge.  Bring a copy of your healthcare power of attorney and living will documents.

## 2016-06-23 NOTE — Telephone Encounter (Signed)
[employer] Dis[employer] Forms have been completed and faxed, per patients request, to [employer] at fax number [phone]. Received confirmation indicating the fax transmittal was successful.  Routing to Dr Quincy Simmonds

## 2016-06-24 ENCOUNTER — Encounter (HOSPITAL_COMMUNITY): Payer: Self-pay

## 2016-06-24 ENCOUNTER — Encounter (HOSPITAL_COMMUNITY)
Admission: RE | Admit: 2016-06-24 | Discharge: 2016-06-24 | Disposition: A | Discharge status: Home / Self Care | Payer: 59 | Source: Ambulatory Visit | Attending: Obstetrics and Gynecology | Admitting: Obstetrics and Gynecology

## 2016-06-24 ENCOUNTER — Ambulatory Visit: Payer: 59 | Admitting: Rheumatology

## 2016-06-24 DIAGNOSIS — M25551 Pain in right hip: Secondary | ICD-10-CM | POA: Insufficient documentation

## 2016-06-24 DIAGNOSIS — Z01812 Encounter for preprocedural laboratory examination: Secondary | ICD-10-CM | POA: Diagnosis not present

## 2016-06-24 DIAGNOSIS — M25552 Pain in left hip: Secondary | ICD-10-CM | POA: Insufficient documentation

## 2016-06-24 DIAGNOSIS — M1611 Unilateral primary osteoarthritis, right hip: Secondary | ICD-10-CM | POA: Diagnosis not present

## 2016-06-24 DIAGNOSIS — M170 Bilateral primary osteoarthritis of knee: Secondary | ICD-10-CM | POA: Insufficient documentation

## 2016-06-24 DIAGNOSIS — M7702 Medial epicondylitis, left elbow: Secondary | ICD-10-CM | POA: Insufficient documentation

## 2016-06-24 DIAGNOSIS — R760 Raised antibody titer: Secondary | ICD-10-CM | POA: Insufficient documentation

## 2016-06-24 DIAGNOSIS — Z96642 Presence of left artificial hip joint: Secondary | ICD-10-CM | POA: Diagnosis not present

## 2016-06-24 HISTORY — DX: Adverse effect of unspecified anesthetic, initial encounter: T41.45XA

## 2016-06-24 HISTORY — DX: Other complications of anesthesia, initial encounter: T88.59XA

## 2016-06-24 LAB — BASIC METABOLIC PANEL
ANION GAP: 9 (ref 5–15)
BUN: 14 mg/dL (ref 6–20)
CALCIUM: 8.8 mg/dL — AB (ref 8.9–10.3)
CO2: 23 mmol/L (ref 22–32)
CREATININE: 0.73 mg/dL (ref 0.44–1.00)
Chloride: 108 mmol/L (ref 101–111)
Glucose, Bld: 178 mg/dL — ABNORMAL HIGH (ref 65–99)
Potassium: 3.9 mmol/L (ref 3.5–5.1)
Sodium: 140 mmol/L (ref 135–145)

## 2016-06-24 LAB — TYPE AND SCREEN
ABO/RH(D): B POS
ANTIBODY SCREEN: NEGATIVE

## 2016-06-24 LAB — CBC
HCT: 35.4 % — ABNORMAL LOW (ref 36.0–46.0)
HEMOGLOBIN: 11.1 g/dL — AB (ref 12.0–15.0)
MCH: 25.8 pg — AB (ref 26.0–34.0)
MCHC: 31.4 g/dL (ref 30.0–36.0)
MCV: 82.1 fL (ref 78.0–100.0)
PLATELETS: 308 10*3/uL (ref 150–400)
RBC: 4.31 MIL/uL (ref 3.87–5.11)
RDW: 16 % — ABNORMAL HIGH (ref 11.5–15.5)
WBC: 11.9 10*3/uL — ABNORMAL HIGH (ref 4.0–10.5)

## 2016-06-24 LAB — ABO/RH: ABO/RH(D): B POS

## 2016-06-24 NOTE — Telephone Encounter (Signed)
Thanks for the update.  OK to close encounter.

## 2016-06-25 ENCOUNTER — Other Ambulatory Visit: Payer: Self-pay | Admitting: *Deleted

## 2016-06-25 LAB — DIFFERENTIAL
BASOS ABS: 0 10*3/uL (ref 0.0–0.1)
Basophils Relative: 0 %
EOS ABS: 0.1 10*3/uL (ref 0.0–0.7)
Eosinophils Relative: 1 %
LYMPHS ABS: 2.8 10*3/uL (ref 0.7–4.0)
Lymphocytes Relative: 23 %
Monocytes Absolute: 0.5 10*3/uL (ref 0.1–1.0)
Monocytes Relative: 5 %
NEUTROS PCT: 72 %
Neutro Abs: 8.7 10*3/uL — ABNORMAL HIGH (ref 1.7–7.7)

## 2016-06-26 ENCOUNTER — Encounter: Payer: Self-pay | Admitting: Obstetrics and Gynecology

## 2016-06-26 ENCOUNTER — Telehealth: Payer: Self-pay | Admitting: *Deleted

## 2016-06-26 LAB — HEMOGLOBIN A1C
HEMOGLOBIN A1C: 6.1 % — AB (ref 4.8–5.6)
MEAN PLASMA GLUCOSE: 128 mg/dL

## 2016-06-26 NOTE — Telephone Encounter (Signed)
Left message to call Cesar Rogerson at [phone].  

## 2016-06-26 NOTE — Telephone Encounter (Signed)
-----   Message from Nunzio Cobbs, MD sent at 06/26/2016 10:25 AM EDT ----- Please let patient know that her hemoglobin A1c shows she is in a prediabetic range.  She does not have diabetes.  She can still proceed with surgery.  She will need to follow up with her PCP and start reducing carbohydrate/sugar intake.  Weight loss will help to control this as well.   I sent her differential through yesterday which showed a slight increase in her neutrophils.  This can be retested after surgery.  She is dealing with arthritic changes and a positive ANA.  I am not sure if these are related or not.   Cc- Lamont Snowball

## 2016-06-26 NOTE — Telephone Encounter (Signed)
Spoke with patient, advised of results and recommendations as seen below per Dr. Quincy Simmonds. Patient verbalizes understanding and is agreeable. Patient had some additional questions for [facility] regarding benefits, call forwarded.  Routing to provider for final review. Patient is agreeable to disposition. Will close encounter.

## 2016-07-05 NOTE — H&P (Signed)
Office Visit   06/18/2016 Mantador Silva, Everardo All, MD  Obstetrics and Gynecology   Uterine leiomyoma, unspecified location  Dx   Office Visit ; Referred by Antony Blackbird, MD  Reason for Visit   Additional Documentation   Vitals:   BP 116/68 (BP Location: Right Arm, Patient Position: Sitting, Cuff Size: Normal)   Pulse 80   Resp 16   Wt  346 lb (156.9 kg)   BMI 50.36 kg/m   BSA 2.77 m   Flowsheets:   Infectious Disease Screening,   Custom Formula Data,   MEWS Score,   Anthropometrics     Encounter Info:   Billing Info,   History,   Allergies,   Detailed Report     All Notes   Progress Notes by Nunzio Cobbs, MD at 06/18/2016 2:30 PM   Author: Nunzio Cobbs, MD Author Type: Physician Filed: 06/25/2016 6:07 PM  Note Status: Addendum Cosign: Cosign Not Required Encounter Date: 06/18/2016  Editor: Nunzio Cobbs, MD (Physician)  Prior Versions: 1. Nunzio Cobbs, MD (Physician) at 06/20/2016 3:49 PM - Signed   2. Archie Balboa, CMA (Certified Psychologist, sport and exercise) at 06/18/2016 2:31 PM - Sign at close encounter    GYNECOLOGY  VISIT   HPI: 46 y.o.   Single  African American  female   [id number] with No LMP recorded. Patient is not currently having periods (Reason: Irregular Periods).   here for surgery consult.  Planning hysterectomy for uterine fibroid.    Pelvic ultrasound on 03/26/16: Uterus with submucous myoma 30 x 26 mm. Has appeared to be transmural on prior ultrasound. EMS10.33 mm. Right ovary absent.  Left ovary with no cyst formation.  No free fluid.  EMB on 09/05/15:  Interval phase endometrium with focal breakdown, negative for atypia, hyperplasia or malignancy.  Bleeding every day, spotting. It is not heavy. No real cramping but has some pelvic aching.   Has not had Depo Provera since February 18, 2016.  Just keep bleeding with spotting and clotting.  Also experienced  hair loss.  Declines future childbearing.  Saw Rheumatology for arthritis.  Positive ANA and positive DS DNA.  No lupus.   Has some reflux/gastritis type symptoms with use of Aleve and Advil. No hx of PUD.  Does well with Tramadol.  Does abx prophylaxis due to hip replacement.  Uses Amoxicillin. Was planning on a tatoo but we talked about her waiting to do this after surgery.  Strong FH of fibroids.  GYNECOLOGIC HISTORY: No LMP recorded. Patient is not currently having periods (Reason: Irregular Periods). Contraception: none/abstinence  Menopausal hormone therapy:  n/a Last mammogram:  10-21-15 Density A/Neg/biRads1:TBC Last pap smear:   08-24-15 Neg:Neg HR HPV; 09-05-12 Neg:Neg HR HPV                OB History    Gravida Para Term Preterm AB Living   1 1 1     1    SAB TAB Ectopic Multiple Live Births                         Patient Active Problem List   Diagnosis Date Noted  . Primary osteoarthritis of both knees 04/29/2016  . ANA positive 04/29/2016  . Osteoarthritis of left hip 07/10/2011        Past Medical History:  Diagnosis Date  . Arthritis   . GERD (gastroesophageal  reflux disease)    occ  . Headache(784.0)    hx migraines         Past Surgical History:  Procedure Laterality Date  . CESAREAN SECTION  2000  . JOINT REPLACEMENT  L   Left hip replacement  . OOPHORECTOMY  2007   Rt.oophorectomy   . OVARIAN CYST SURGERY  ? and 10   rt removed,cyst off lft  . TOTAL HIP ARTHROPLASTY  07/06/2011   Procedure: TOTAL HIP ARTHROPLASTY;  Surgeon: Kerin Salen, MD;  Location: [place];  Service: Orthopedics;  Laterality: Left;  DEPUY/PINNACLE          Current Outpatient Prescriptions  Medication Sig Dispense Refill  . Diclofenac Potassium (CAMBIA) 50 MG PACK Take 50 mg by mouth daily as needed (for migraine/headaches.). MIX THE CONTENTS OF A PACKET IN 1-2 OZ OF WATER FOR MIGRAINE HEADACHE RELIEF.    Marland Kitchen diclofenac sodium  (VOLTAREN) 1 % GEL Apply 1 application topically 4 (four) times daily as needed (for pain.).   1  . IRON PO Take 10 mLs by mouth daily. LIQUID IRON (Ferric Glycinate) 18 mg per 10 ml    . methocarbamol (ROBAXIN) 500 MG tablet Take 500 mg by mouth every 8 (eight) hours as needed for muscle spasms.   1  . Tetrahydrozoline HCl (VISINE OP) Place 1 drop into both eyes 3 (three) times daily as needed (for irritated/dry eyes.).    Marland Kitchen traMADol (ULTRAM) 50 MG tablet Take 100 mg by mouth daily as needed (for pain.).    Marland Kitchen TURMERIC PO Take 1 capsule by mouth daily.      No current facility-administered medications for this visit.      ALLERGIES: Monosodium glutamate; Lactose intolerance (gi); Caffeine; Hydrocodone; and Oxycodone       Family History  Problem Relation Age of Onset  . Osteoarthritis Mother   . Osteoarthritis Brother   . Hypertension Brother   . Osteoarthritis Maternal Grandmother     Social History        Social History  . Marital status: Single    Spouse name: N/A  . Number of children: N/A  . Years of education: N/A      Occupational History  . Not on file.       Social History Main Topics  . Smoking status: Never Smoker  . Smokeless tobacco: Never Used  . Alcohol use No  . Drug use: No  . Sexual activity: No       Other Topics Concern  . Not on file      Social History Narrative  . No narrative on file    ROS:  Pertinent items are noted in HPI.  PHYSICAL EXAMINATION:    BP 116/68 (BP Location: Right Arm, Patient Position: Sitting, Cuff Size: Normal)   Pulse 80   Resp 16   Wt (!) 346 lb (156.9 kg)   BMI 50.36 kg/m     General appearance: alert, cooperative and appears stated age Head: Normocephalic, without obvious abnormality, atraumatic Neck: no adenopathy, supple, symmetrical, trachea midline and thyroid normal to inspection and palpation Lungs: clear to auscultation bilaterally Heart: regular rate and  rhythm Abdomen: obese with pannus, soft, non-tender, no masses,  no organomegaly Extremities: extremities normal, atraumatic, no cyanosis or edema Skin: Skin color, texture, turgor normal. No rashes or lesions Lymph nodes: Cervical, supraclavicular, and axillary nodes normal. No abnormal inguinal nodes palpated Neurologic: Grossly normal  Pelvic: External genitalia:  no lesions  Urethra:  normal appearing urethra with no masses, tenderness or lesions              Bartholins and Skenes: normal                 Vagina: normal appearing vagina with normal color and discharge, no lesions              Cervix: no lesions                Bimanual Exam:  Uterus:  normal size, contour, position, consistency, mobility, non-tender              Adnexa: no mass, fullness, tenderness   Chaperone was present for exam.  ASSESSMENT  Symptomatic submucous vs transmural uterine fibroid.  Status post right salpingo-ophorectomy for dermoid. Status post left ovarian cystectomy for dermoid. Arthritis. Hx Cesarean Section. Reflux/gastritis with Aleve and Advil.  PLAN  I discussed robotic hysterectomy with bilateral salpingo-oophorectomy.  I reviewed risks, benefits, and alternatives.  Risks include but are not limited to bleeding, infection, damage to surrounding organs, pneumonia, reaction to anesthesia, DVT, PE, death, need for reoperation, hernia formation, neuropathy, and vaginal cuff dehiscence. Surgical expectations and recovery discussed.  Patient would like to be discharged home same day if possible.  Will plan for Tramadol for post op pain.   An After Visit Summary was printed and given to the patient.  __25____ minutes face to face time of which over 50% was spent in counseling.   Addendum:  Patient will have a robotic total laparoscopic hysterectomy with left salpingectomy, and cystoscopy.

## 2016-07-05 NOTE — Anesthesia Preprocedure Evaluation (Addendum)
Anesthesia Evaluation  Patient identified by MRN, date of birth, ID band Patient awake    Reviewed: Allergy & Precautions, H&P , Patient's Chart, lab work & pertinent test results, reviewed documented beta blocker date and time   Airway Mallampati: II  TM Distance: >3 FB Neck ROM: full    Dental no notable dental hx.    Pulmonary    Pulmonary exam normal breath sounds clear to auscultation       Cardiovascular  Rhythm:regular Rate:Normal     Neuro/Psych    GI/Hepatic   Endo/Other    Renal/GU      Musculoskeletal   Abdominal   Peds  Hematology   Anesthesia Other Findings   Reproductive/Obstetrics                             Anesthesia Physical Anesthesia Plan  ASA: III  Anesthesia Plan: General   Post-op Pain Management:    Induction: Intravenous  PONV Risk Score and Plan:   Airway Management Planned: Oral ETT  Additional Equipment:   Intra-op Plan:   Post-operative Plan: Extubation in OR  Informed Consent: I have reviewed the patients History and Physical, chart, labs and discussed the procedure including the risks, benefits and alternatives for the proposed anesthesia with the patient or authorized representative who has indicated his/her understanding and acceptance.   Dental Advisory Given  Plan Discussed with: CRNA and Surgeon  Anesthesia Plan Comments: (  )        Anesthesia Quick Evaluation

## 2016-07-06 ENCOUNTER — Ambulatory Visit (HOSPITAL_COMMUNITY): Payer: 59 | Admitting: Anesthesiology

## 2016-07-06 ENCOUNTER — Observation Stay (HOSPITAL_COMMUNITY)
Admission: RE | Admit: 2016-07-06 | Discharge: 2016-07-07 | Disposition: A | Discharge status: Home / Self Care | Payer: 59 | Source: Ambulatory Visit | Attending: Obstetrics and Gynecology | Admitting: Obstetrics and Gynecology

## 2016-07-06 ENCOUNTER — Encounter (HOSPITAL_COMMUNITY)
Admission: RE | Disposition: A | Discharge status: Home / Self Care | Payer: Self-pay | Source: Ambulatory Visit | Attending: Obstetrics and Gynecology

## 2016-07-06 ENCOUNTER — Encounter (HOSPITAL_COMMUNITY): Payer: Self-pay

## 2016-07-06 DIAGNOSIS — D259 Leiomyoma of uterus, unspecified: Principal | ICD-10-CM | POA: Insufficient documentation

## 2016-07-06 DIAGNOSIS — Z90710 Acquired absence of both cervix and uterus: Secondary | ICD-10-CM | POA: Diagnosis present

## 2016-07-06 DIAGNOSIS — D649 Anemia, unspecified: Secondary | ICD-10-CM | POA: Diagnosis not present

## 2016-07-06 DIAGNOSIS — Y92234 Operating room of hospital as the place of occurrence of the external cause: Secondary | ICD-10-CM | POA: Diagnosis not present

## 2016-07-06 DIAGNOSIS — N9971 Accidental puncture and laceration of a genitourinary system organ or structure during a genitourinary system procedure: Secondary | ICD-10-CM | POA: Insufficient documentation

## 2016-07-06 DIAGNOSIS — Y763 Surgical instruments, materials and obstetric and gynecological devices (including sutures) associated with adverse incidents: Secondary | ICD-10-CM | POA: Insufficient documentation

## 2016-07-06 DIAGNOSIS — Y658 Other specified misadventures during surgical and medical care: Secondary | ICD-10-CM | POA: Insufficient documentation

## 2016-07-06 DIAGNOSIS — D509 Iron deficiency anemia, unspecified: Secondary | ICD-10-CM | POA: Diagnosis not present

## 2016-07-06 DIAGNOSIS — M170 Bilateral primary osteoarthritis of knee: Secondary | ICD-10-CM | POA: Diagnosis not present

## 2016-07-06 DIAGNOSIS — N898 Other specified noninflammatory disorders of vagina: Secondary | ICD-10-CM | POA: Diagnosis not present

## 2016-07-06 DIAGNOSIS — K219 Gastro-esophageal reflux disease without esophagitis: Secondary | ICD-10-CM | POA: Diagnosis not present

## 2016-07-06 DIAGNOSIS — M1612 Unilateral primary osteoarthritis, left hip: Secondary | ICD-10-CM | POA: Diagnosis not present

## 2016-07-06 HISTORY — PX: TOTAL LAPAROSCOPIC HYSTERECTOMY WITH SALPINGECTOMY: SHX6742

## 2016-07-06 HISTORY — PX: CYSTOSCOPY: SHX5120

## 2016-07-06 LAB — CBC
HEMATOCRIT: 37.7 % (ref 36.0–46.0)
HEMOGLOBIN: 12.1 g/dL (ref 12.0–15.0)
MCH: 25.7 pg — ABNORMAL LOW (ref 26.0–34.0)
MCHC: 32.1 g/dL (ref 30.0–36.0)
MCV: 80 fL (ref 78.0–100.0)
Platelets: 315 10*3/uL (ref 150–400)
RBC: 4.71 MIL/uL (ref 3.87–5.11)
RDW: 15.7 % — ABNORMAL HIGH (ref 11.5–15.5)
WBC: 16.8 10*3/uL — AB (ref 4.0–10.5)

## 2016-07-06 LAB — CREATININE, SERUM
Creatinine, Ser: 0.7 mg/dL (ref 0.44–1.00)
GFR calc non Af Amer: >60 mL/min (ref >60)

## 2016-07-06 LAB — PREGNANCY, URINE: PREG TEST UR: NEGATIVE

## 2016-07-06 SURGERY — HYSTERECTOMY, TOTAL, LAPAROSCOPIC, WITH SALPINGECTOMY
Anesthesia: General | Site: Abdomen

## 2016-07-06 MED ORDER — LIDOCAINE HCL (CARDIAC) 20 MG/ML IV SOLN
INTRAVENOUS | Status: AC
  Filled 2016-07-06: qty 5

## 2016-07-06 MED ORDER — MIDAZOLAM HCL 2 MG/2ML IJ SOLN
INTRAMUSCULAR | Status: DC | PRN
  Administered 2016-07-06: 1 mg via INTRAVENOUS

## 2016-07-06 MED ORDER — LIDOCAINE HCL 1 % IJ SOLN
INTRAMUSCULAR | Status: AC
  Filled 2016-07-06: qty 10

## 2016-07-06 MED ORDER — SODIUM CHLORIDE 0.9 % IV SOLN
INTRAVENOUS | Status: DC | PRN
  Administered 2016-07-06: 60 mL

## 2016-07-06 MED ORDER — ONDANSETRON HCL 4 MG/2ML IJ SOLN: 4.0000 mg | Freq: Four times a day (QID) | INTRAMUSCULAR | Status: DC | PRN

## 2016-07-06 MED ORDER — CEFOTETAN DISODIUM-DEXTROSE 2-2.08 GM-% IV SOLR
INTRAVENOUS | Status: AC
  Filled 2016-07-06: qty 50

## 2016-07-06 MED ORDER — SCOPOLAMINE 1 MG/3DAYS TD PT72
MEDICATED_PATCH | TRANSDERMAL | Status: AC
  Administered 2016-07-06: 1.5 mg via TRANSDERMAL
  Filled 2016-07-06: qty 1

## 2016-07-06 MED ORDER — ENOXAPARIN SODIUM 40 MG/0.4ML ~~LOC~~ SOLN
40.0000 mg | SUBCUTANEOUS | Status: AC
  Administered 2016-07-06: 40 mg via SUBCUTANEOUS
  Filled 2016-07-06: qty 0.4

## 2016-07-06 MED ORDER — LACTATED RINGERS IV SOLN
INTRAVENOUS | Status: DC
  Administered 2016-07-06: 07:00:00 via INTRAVENOUS

## 2016-07-06 MED ORDER — SUGAMMADEX SODIUM 500 MG/5ML IV SOLN
INTRAVENOUS | Status: DC | PRN
  Administered 2016-07-06: 310 mg via INTRAVENOUS

## 2016-07-06 MED ORDER — METOCLOPRAMIDE HCL 10 MG PO TABS
ORAL_TABLET | ORAL | Status: AC
  Administered 2016-07-06: 10 mg via ORAL
  Filled 2016-07-06: qty 1

## 2016-07-06 MED ORDER — SODIUM CHLORIDE 0.9 % IJ SOLN
INTRAMUSCULAR | Status: AC
  Filled 2016-07-06: qty 20

## 2016-07-06 MED ORDER — SODIUM CHLORIDE 0.9 % IJ SOLN
INTRAMUSCULAR | Status: AC
  Filled 2016-07-06: qty 10

## 2016-07-06 MED ORDER — ROCURONIUM BROMIDE 100 MG/10ML IV SOLN
INTRAVENOUS | Status: AC
  Filled 2016-07-06: qty 1

## 2016-07-06 MED ORDER — BUPIVACAINE HCL (PF) 0.25 % IJ SOLN
INTRAMUSCULAR | Status: AC
  Filled 2016-07-06: qty 30

## 2016-07-06 MED ORDER — PANTOPRAZOLE SODIUM 40 MG PO TBEC
40.0000 mg | DELAYED_RELEASE_TABLET | Freq: Once | ORAL | Status: AC
  Administered 2016-07-06: 40 mg via ORAL

## 2016-07-06 MED ORDER — ROCURONIUM BROMIDE 100 MG/10ML IV SOLN
INTRAVENOUS | Status: DC | PRN
  Administered 2016-07-06: 20 mg via INTRAVENOUS
  Administered 2016-07-06 (×4): 10 mg via INTRAVENOUS
  Administered 2016-07-06: 5 mg via INTRAVENOUS
  Administered 2016-07-06: 40 mg via INTRAVENOUS
  Administered 2016-07-06: 10 mg via INTRAVENOUS
  Administered 2016-07-06: 5 mg via INTRAVENOUS

## 2016-07-06 MED ORDER — FAMOTIDINE 20 MG PO TABS
ORAL_TABLET | ORAL | Status: AC
  Administered 2016-07-06: 20 mg via ORAL
  Filled 2016-07-06: qty 1

## 2016-07-06 MED ORDER — ACETAMINOPHEN 160 MG/5ML PO SOLN
975.0000 mg | Freq: Once | ORAL | Status: AC
  Administered 2016-07-06: 975 mg via ORAL

## 2016-07-06 MED ORDER — FENTANYL CITRATE (PF) 100 MCG/2ML IJ SOLN
INTRAMUSCULAR | Status: AC
  Administered 2016-07-06: 50 ug via INTRAVENOUS
  Filled 2016-07-06: qty 2

## 2016-07-06 MED ORDER — TRAMADOL HCL 50 MG PO TABS
100.0000 mg | ORAL_TABLET | Freq: Four times a day (QID) | ORAL | Status: DC | PRN
  Administered 2016-07-06: 100 mg via ORAL
  Filled 2016-07-06: qty 2

## 2016-07-06 MED ORDER — ARTIFICIAL TEARS OPHTHALMIC OINT
TOPICAL_OINTMENT | OPHTHALMIC | Status: AC
  Filled 2016-07-06: qty 17.5

## 2016-07-06 MED ORDER — SUGAMMADEX SODIUM 500 MG/5ML IV SOLN
INTRAVENOUS | Status: AC
Start: 2016-07-06 — End: 2016-07-06
  Filled 2016-07-06: qty 5

## 2016-07-06 MED ORDER — LACTATED RINGERS IV SOLN
INTRAVENOUS | Status: DC
  Administered 2016-07-06: 19:00:00 via INTRAVENOUS
  Administered 2016-07-07: 125 mL/h via INTRAVENOUS

## 2016-07-06 MED ORDER — FENTANYL CITRATE (PF) 250 MCG/5ML IJ SOLN
INTRAMUSCULAR | Status: AC
  Filled 2016-07-06: qty 5

## 2016-07-06 MED ORDER — PROPOFOL 10 MG/ML IV BOLUS
INTRAVENOUS | Status: AC
  Filled 2016-07-06: qty 40

## 2016-07-06 MED ORDER — FENTANYL CITRATE (PF) 100 MCG/2ML IJ SOLN
25.0000 ug | INTRAMUSCULAR | Status: DC | PRN
  Administered 2016-07-06 (×2): 50 ug via INTRAVENOUS

## 2016-07-06 MED ORDER — FENTANYL CITRATE (PF) 100 MCG/2ML IJ SOLN
INTRAMUSCULAR | Status: DC | PRN
Start: 2016-07-06 — End: 2016-07-06
  Administered 2016-07-06 (×9): 50 ug via INTRAVENOUS
  Administered 2016-07-06 (×2): 25 ug via INTRAVENOUS

## 2016-07-06 MED ORDER — PROPOFOL 10 MG/ML IV BOLUS
INTRAVENOUS | Status: DC | PRN
  Administered 2016-07-06: 300 mg via INTRAVENOUS
  Administered 2016-07-06: 40 mg via INTRAVENOUS

## 2016-07-06 MED ORDER — DEXAMETHASONE SODIUM PHOSPHATE 10 MG/ML IJ SOLN
INTRAMUSCULAR | Status: DC | PRN
  Administered 2016-07-06: 4 mg via INTRAVENOUS

## 2016-07-06 MED ORDER — FAMOTIDINE 20 MG PO TABS
20.0000 mg | ORAL_TABLET | Freq: Once | ORAL | Status: AC | PRN
  Administered 2016-07-06: 20 mg via ORAL

## 2016-07-06 MED ORDER — ONDANSETRON HCL 4 MG PO TABS: 4.0000 mg | ORAL_TABLET | Freq: Four times a day (QID) | ORAL | Status: DC | PRN

## 2016-07-06 MED ORDER — LACTATED RINGERS IV SOLN
INTRAVENOUS | Status: DC
  Administered 2016-07-06 (×3): via INTRAVENOUS

## 2016-07-06 MED ORDER — PANTOPRAZOLE SODIUM 40 MG PO TBEC
DELAYED_RELEASE_TABLET | ORAL | Status: AC
  Administered 2016-07-06: 40 mg via ORAL
  Filled 2016-07-06: qty 1

## 2016-07-06 MED ORDER — ONDANSETRON HCL 4 MG/2ML IJ SOLN
INTRAMUSCULAR | Status: DC | PRN
  Administered 2016-07-06: 4 mg via INTRAVENOUS

## 2016-07-06 MED ORDER — LACTATED RINGERS IR SOLN
Status: DC | PRN
  Administered 2016-07-06: 3000 mL

## 2016-07-06 MED ORDER — ENOXAPARIN SODIUM 40 MG/0.4ML ~~LOC~~ SOLN
40.0000 mg | SUBCUTANEOUS | Status: DC
  Filled 2016-07-06: qty 0.4

## 2016-07-06 MED ORDER — ONDANSETRON HCL 4 MG/2ML IJ SOLN
INTRAMUSCULAR | Status: AC
  Filled 2016-07-06: qty 2

## 2016-07-06 MED ORDER — MIDAZOLAM HCL 2 MG/2ML IJ SOLN
INTRAMUSCULAR | Status: AC
  Filled 2016-07-06: qty 2

## 2016-07-06 MED ORDER — SCOPOLAMINE 1 MG/3DAYS TD PT72: 1.0000 | MEDICATED_PATCH | Freq: Once | TRANSDERMAL | Status: DC

## 2016-07-06 MED ORDER — BUPIVACAINE HCL (PF) 0.25 % IJ SOLN
INTRAMUSCULAR | Status: DC | PRN
  Administered 2016-07-06: 6 mL

## 2016-07-06 MED ORDER — MORPHINE SULFATE (PF) 4 MG/ML IV SOLN
2.0000 mg | INTRAVENOUS | Status: DC | PRN
  Administered 2016-07-06 (×2): 2 mg via INTRAVENOUS
  Filled 2016-07-06 (×2): qty 1

## 2016-07-06 MED ORDER — METOCLOPRAMIDE HCL 10 MG PO TABS
10.0000 mg | ORAL_TABLET | Freq: Once | ORAL | Status: AC | PRN
  Administered 2016-07-06: 10 mg via ORAL

## 2016-07-06 MED ORDER — MENTHOL 3 MG MT LOZG
1.0000 | LOZENGE | OROMUCOSAL | Status: DC | PRN
  Administered 2016-07-06: 3 mg via ORAL
  Filled 2016-07-06: qty 9

## 2016-07-06 MED ORDER — CEFOTETAN DISODIUM-DEXTROSE 2-2.08 GM-% IV SOLR
2.0000 g | INTRAVENOUS | Status: AC
  Administered 2016-07-06: 2 g via INTRAVENOUS

## 2016-07-06 MED ORDER — STERILE WATER FOR IRRIGATION IR SOLN
Status: DC | PRN
  Administered 2016-07-06: 1000 mL

## 2016-07-06 MED ORDER — DEXAMETHASONE SODIUM PHOSPHATE 4 MG/ML IJ SOLN
INTRAMUSCULAR | Status: AC
  Filled 2016-07-06: qty 1

## 2016-07-06 MED ORDER — SCOPOLAMINE 1 MG/3DAYS TD PT72
1.0000 | MEDICATED_PATCH | Freq: Once | TRANSDERMAL | Status: DC | PRN
  Administered 2016-07-06: 1.5 mg via TRANSDERMAL
  Filled 2016-07-06: qty 1

## 2016-07-06 MED ORDER — LIDOCAINE HCL (CARDIAC) 20 MG/ML IV SOLN
INTRAVENOUS | Status: DC | PRN
  Administered 2016-07-06: 100 mg via INTRAVENOUS

## 2016-07-06 MED ORDER — ROPIVACAINE HCL 5 MG/ML IJ SOLN
INTRAMUSCULAR | Status: AC
  Filled 2016-07-06: qty 30

## 2016-07-06 MED ORDER — ACETAMINOPHEN 160 MG/5ML PO SOLN
ORAL | Status: AC
  Administered 2016-07-06: 975 mg via ORAL
  Filled 2016-07-06: qty 40.6

## 2016-07-06 SURGICAL SUPPLY — 68 items
ADH SKN CLS APL DERMABOND .7 (GAUZE/BANDAGES/DRESSINGS) ×3
APL SRG 38 LTWT LNG FL B (MISCELLANEOUS) ×3
APPLICATOR ARISTA FLEXITIP XL (MISCELLANEOUS) ×2 IMPLANT
BARRIER ADHS 3X4 INTERCEED (GAUZE/BANDAGES/DRESSINGS) IMPLANT
BRR ADH 4X3 ABS CNTRL BYND (GAUZE/BANDAGES/DRESSINGS)
CABLE HIGH FREQUENCY MONO STRZ (ELECTRODE) ×2 IMPLANT
CANISTER SUCT 3000ML PPV (MISCELLANEOUS) ×4 IMPLANT
CATH FOLEY 3WAY  5CC 16FR (CATHETERS) ×1
CATH FOLEY 3WAY 5CC 16FR (CATHETERS) ×3 IMPLANT
CLOTH BEACON ORANGE TIMEOUT ST (SAFETY) ×4 IMPLANT
CONT PATH 16OZ SNAP LID 3702 (MISCELLANEOUS) ×8 IMPLANT
COVER BACK TABLE 60X90IN (DRAPES) ×8 IMPLANT
COVER TIP SHEARS 8 DVNC (MISCELLANEOUS) ×3 IMPLANT
COVER TIP SHEARS 8MM DA VINCI (MISCELLANEOUS) ×1
DECANTER SPIKE VIAL GLASS SM (MISCELLANEOUS) ×16 IMPLANT
DEFOGGER SCOPE WARMER CLEARIFY (MISCELLANEOUS) ×4 IMPLANT
DERMABOND ADVANCED (GAUZE/BANDAGES/DRESSINGS) ×1
DERMABOND ADVANCED .7 DNX12 (GAUZE/BANDAGES/DRESSINGS) ×3 IMPLANT
DRAPE ROBOTICS STRL (DRAPES) ×2 IMPLANT
DURAPREP 26ML APPLICATOR (WOUND CARE) ×4 IMPLANT
ELECT REM PT RETURN 9FT ADLT (ELECTROSURGICAL) ×4
ELECTRODE REM PT RTRN 9FT ADLT (ELECTROSURGICAL) ×3 IMPLANT
GAUZE VASELINE 3X9 (GAUZE/BANDAGES/DRESSINGS) IMPLANT
GLOVE BIO SURGEON STRL SZ 6.5 (GLOVE) ×12 IMPLANT
GLOVE BIOGEL PI IND STRL 7.0 (GLOVE) ×9 IMPLANT
GLOVE BIOGEL PI INDICATOR 7.0 (GLOVE) ×3
GOWN STRL REUS W/TWL LRG LVL3 (GOWN DISPOSABLE) ×8 IMPLANT
HEMOSTAT ARISTA ABSORB 3G PWDR (MISCELLANEOUS) ×2 IMPLANT
KIT ACCESSORY DA VINCI DISP (KITS) ×1
KIT ACCESSORY DVNC DISP (KITS) ×3 IMPLANT
LEGGING LITHOTOMY PAIR STRL (DRAPES) ×4 IMPLANT
LIGASURE VESSEL 5MM BLUNT TIP (ELECTROSURGICAL) ×4 IMPLANT
NEEDLE INSUFFLATION 120MM (ENDOMECHANICALS) ×2 IMPLANT
OCCLUDER COLPOPNEUMO (BALLOONS) ×2 IMPLANT
PACK ROBOT WH (CUSTOM PROCEDURE TRAY) ×4 IMPLANT
PACK ROBOTIC GOWN (GOWN DISPOSABLE) ×4 IMPLANT
PACK TRENDGUARD 450 HYBRID PRO (MISCELLANEOUS) IMPLANT
PACK TRENDGUARD 600 HYBRD PROC (MISCELLANEOUS) ×1 IMPLANT
PAD PREP 24X48 CUFFED NSTRL (MISCELLANEOUS) ×4 IMPLANT
POUCH LAPAROSCOPIC INSTRUMENT (MISCELLANEOUS) ×2 IMPLANT
SCISSORS LAP 5X35 DISP (ENDOMECHANICALS) ×2 IMPLANT
SET CYSTO W/LG BORE CLAMP LF (SET/KITS/TRAYS/PACK) ×4 IMPLANT
SET IRRIG TUBING LAPAROSCOPIC (IRRIGATION / IRRIGATOR) ×4 IMPLANT
SET TRI-LUMEN FLTR TB AIRSEAL (TUBING) ×4 IMPLANT
SUT VIC AB 0 CT2 27 (SUTURE) ×8 IMPLANT
SUT VIC AB 4-0 PS2 27 (SUTURE) ×8 IMPLANT
SUT VICRYL 0 UR6 27IN ABS (SUTURE) ×4 IMPLANT
SUT VLOC 180 0 9IN  GS21 (SUTURE)
SUT VLOC 180 0 9IN GS21 (SUTURE) IMPLANT
SYR 50ML LL SCALE MARK (SYRINGE) ×8 IMPLANT
SYSTEM CARTER THOMASON II (TROCAR) ×2 IMPLANT
TIP RUMI ORANGE 6.7MMX12CM (TIP) IMPLANT
TIP UTERINE 5.1X6CM LAV DISP (MISCELLANEOUS) IMPLANT
TIP UTERINE 6.7X10CM GRN DISP (MISCELLANEOUS) IMPLANT
TIP UTERINE 6.7X6CM WHT DISP (MISCELLANEOUS) IMPLANT
TIP UTERINE 6.7X8CM BLUE DISP (MISCELLANEOUS) ×2 IMPLANT
TOWEL OR 17X24 6PK STRL BLUE (TOWEL DISPOSABLE) ×12 IMPLANT
TRAY FOLEY CATH SILVER 14FR (SET/KITS/TRAYS/PACK) ×2 IMPLANT
TRENDGUARD 450 HYBRID PRO PACK (MISCELLANEOUS)
TRENDGUARD 600 HYBRID PROC PK (MISCELLANEOUS) ×4
TROCAR 5M 150ML BLDLS (TROCAR) ×2 IMPLANT
TROCAR DILATING TIP 12MM 150MM (ENDOMECHANICALS) ×4 IMPLANT
TROCAR DISP BLADELESS 8 DVNC (TROCAR) ×3 IMPLANT
TROCAR DISP BLADELESS 8MM (TROCAR) ×1
TROCAR PORT AIRSEAL 5X120 (TROCAR) ×2 IMPLANT
TROCAR PORT AIRSEAL 8X120 (TROCAR) ×4 IMPLANT
TROCAR XCEL NON BLADE 8MM B8LT (ENDOMECHANICALS) ×2 IMPLANT
WATER STERILE IRR 1000ML POUR (IV SOLUTION) ×4 IMPLANT

## 2016-07-06 NOTE — Transfer of Care (Signed)
Immediate Anesthesia Transfer of Care Note  Patient: Julia Monroe  Procedure(s) Performed: Procedure(s): HYSTERECTOMY TOTAL LAPAROSCOPIC WITH Left SALPINGECTOMY, Suture Left Side Wall Laceration (Left) CYSTOSCOPY (N/A)  Patient Location: PACU  Anesthesia Type:General  Level of Consciousness: awake, alert  and patient cooperative  Airway & Oxygen Therapy: Patient Spontanous Breathing and Patient connected to nasal cannula oxygen  Post-op Assessment: Report given to RN and Post -op Vital signs reviewed and stable  Post vital signs: Reviewed and stable  Last Vitals:  Vitals:   07/06/16 0636  BP: (!) 100/94  Pulse: 88  Resp: 20  Temp: 36.7 C    Last Pain:  Vitals:   07/06/16 0636  TempSrc: Oral  PainSc:       Patients Stated Pain Goal: 6 (82/50/53 9767)  Complications: No apparent anesthesia complications

## 2016-07-06 NOTE — Progress Notes (Signed)
Update to History and Physical  Having spotting today.  Notes some low back pain.   Patient examined.   OK to proceed with surgery.  Discussed the possibility of laparotomy if necessary to complete the surgery.

## 2016-07-06 NOTE — Progress Notes (Signed)
Day of Surgery Procedure(s) (LRB): HYSTERECTOMY TOTAL LAPAROSCOPIC WITH Left SALPINGECTOMY, Suture Left Side Wall Laceration (Left) CYSTOSCOPY (N/A)  Subjective: Patient reports incisional pain and no problems voiding.   Up to bathroom once so far.  Voided well.   Objective: I have reviewed patient's vital signs, intake and output and labs. Vitals:   07/06/16 1420 07/06/16 1529  BP: 126/61 128/84  Pulse: 94 96  Resp: (!) 22 20  Temp: 98.6 F (37 C) 99.3 F (37.4 C)   I/O - 2200 cc/1575 cc.  CBC    Component Value Date/Time   WBC 16.8 (H) 07/06/2016 1510   RBC 4.71 07/06/2016 1510   HGB 12.1 07/06/2016 1510   HGB 11.7 (L) 03/04/2016 1546   HCT 37.7 07/06/2016 1510   PLT 315 07/06/2016 1510   MCV 80.0 07/06/2016 1510   MCH 25.7 (L) 07/06/2016 1510   MCHC 32.1 07/06/2016 1510   RDW 15.7 (H) 07/06/2016 1510   LYMPHSABS 2.8 06/24/2016 1550   MONOABS 0.5 06/24/2016 1550   EOSABS 0.1 06/24/2016 1550   BASOSABS 0.0 06/24/2016 1550   Cr 0.7   General: alert and cooperative Resp: clear to auscultation bilaterally Cardio: regular rate and rhythm, S1, S2 normal, no murmur, click, rub or gallop GI: incision: intact.  Appears to have had mild bleeding from umbilical incision. and hypoactive bowel sounds, soft, nontender. Extremities: PAS and ted hose on.  Vaginal Bleeding: none  Assessment: s/p Procedure(s): HYSTERECTOMY TOTAL LAPAROSCOPIC WITH Left SALPINGECTOMY, Suture Left Side Wall Laceration (Left) CYSTOSCOPY (N/A): stable  Plan: Advance diet Morphine IV or Tramadol po for pain meds.  Lovenox tomorrow 40 mg dosage tomorrow.  I discussed this with the pharmacist.   I will use this lower dosage due to the vaginal laceration repair that I needed to perform at the end of the case.  I suspect this was due to the uterine manipulator and tissue dystocia.  I reviewed surgical findings and procedure including not using the robot and needing to repair the vaginal laceration.   Patient indicates understanding.    LOS: 0 days    Arloa Koh 07/06/2016, 6:20 PM

## 2016-07-06 NOTE — Brief Op Note (Signed)
07/06/2016  12:20 PM  PATIENT:  Julia Monroe  46 y.o. female  PRE-OPERATIVE DIAGNOSIS:  Symptomatic uterine fibroid, anemia  POST-OPERATIVE DIAGNOSIS:  Symptomatic uterine fibroid, anemia, left vaginal sidewall laceration  PROCEDURE:  Procedure(s): HYSTERECTOMY TOTAL LAPAROSCOPIC WITH Left SALPINGECTOMY, Suture Left Side Wall Laceration (Left) CYSTOSCOPY (N/A)  SURGEON:  Surgeon(s) and Role:    * Amundson Raliegh Ip, MD - Primary    * Talbert Nan, Francesca Jewett, MD - Assisting  PHYSICIAN ASSISTANT: NA  ASSISTANTS: Salvadore Dom, MD   ANESTHESIA:   local, general and Intraperitoneal ropivicaine 30 cc in 30 cc normal saline  EBL:  Total I/O In: 1000 [I.V.:1000] Out: 400 [Urine:175; Blood:225]  BLOOD ADMINISTERED:none  DRAINS: none   LOCAL MEDICATIONS USED:  MARCAINE     SPECIMEN:  Source of Specimen:  uterus, cervix, and left tube  DISPOSITION OF SPECIMEN:  PATHOLOGY  COUNTS:  YES  TOURNIQUET:  * No tourniquets in log *  DICTATION: .Note written in EPIC  PLAN OF CARE: Admit for overnight observation  PATIENT DISPOSITION:  PACU - hemodynamically stable.   Delay start of Pharmacological VTE agent (>24hrs) due to surgical blood loss or risk of bleeding: no

## 2016-07-06 NOTE — Op Note (Addendum)
OPERATIVE REPORT   PREOPERATIVE DIAGNOSIS: Symptomatic uterine fibroid, anemia.  POSTOPERATIVE DIAGNOSIS:   Symptomatic uterine fibroid, anemia, left vaginal sidewall laceration.  PROCEDURES: Total laparoscopic hysterectomy with left salpingectomy, cystoscopy, repair of left vaginal sidewall laceration.   SURGEON: Lenard Galloway, M.D.  ASSISTANT:   Dorothy Spark, M.D.  ANESTHESIA: General endotracheal, intraperitoneal ropivicaine 30 mL diluted in 30 mL of normal saline, local with 0.25% Marcaine.  IVF:   2000 cc LR.  ESTIMATED BLOOD LOSS:   225 cc.  URINE OUTPUT:  175 cc.  COMPLICATIONS: left vaginal sidewall laceration likely from RUMI uterine manipulator.  INDICATIONS FOR THE PROCEDURE:    The patient is a 46 year old P64 African American female who presents with bleeding and anemia from a uterine fibroid.  The fibroid by ultrasound appears to be submucosal but extending essentially transmurally.  The patient is status post right salpingo-oophorectomy.  The patient declines continued medical therapy, and she declines future childbearing.  She is requesting hysterectomy. A plan is made to proceed with a robotic total laparoscopic hysterectomy with left salpingectomy, and cystoscopy after risks, benefits, and alternatives are reviewed.  FINDINGS:    Laparoscopy revealed a 10 week size boggy uterus with fundal enlargement.  The right tube and ovary were absent. The left tube and ovary were normal.  The upper abdomen was normal and demonstrated a normal liver.  There was no adhesive disease or endometriosis were seen in the abdomen or pelvis.  Cystoscopy at the termination of the procedure showed the bladder to be normal throughout 360 degrees including the bladder dome and trigone.  There was no evidence of any lesions or foreign body of the bladder or the urethra.  Both of the ureters were noted to be patent bilaterally.  SPECIMENS:  The uterus, cervix, and left  fallopian tube were went to pathology.  Final weight of the specimen was 296.4 grams.  DESCRIPTION OF PROCEDURE:   The patient was reidentified in the preoperative hold area.  She did receive  Cefotetan IV for antibiotic prophylaxis. She received Lovenox, TED hose and PAS stockings for DVT prophylaxis.  In the operating room, the patient was placed in the dorsal lithotomy position on the operating room table. The Trendguard was used for her positioning as well. Her legs were placed in the Cardwell stirrups and her arms were both tucked at her sides. The patient received general endotracheal anesthesia. The abdomen and vagina were then sterilely prepped and she was sterilely draped.  A speculum was placed in the vagina and a single-tooth tenaculum was placed on the anterior cervical lip. A figure-of-eight suture of 0 Vicryl was placed on each the anterior and the posterior cervical lips. The uterus was sounded to __almost 9 ___ cm. The cervix was then dilated with [facility] dilators. A #  __8____ RUMI tip with a KOH ring was then placed through the cervix and into the uterine cavity without difficulty. The remaining vaginal instruments were then removed. A Foley catheter was placed inside the bladder.  Attention was turned to the abdomen where the umbilical region was injected with 0.25% Marcaine and a small incision created.  A Veress needle was then used to insufflate the abdomen with CO2 gas after a saline drop test was performed and the fluid flowed freely.  The CO2 pneumoperitoneum was partially achieved. The Veress was removed. A 1.5 cm supraumbilical incision was created with a scalpel after injecting locally with 0.25% Marcaine.  The robotic umbilical trocar was placed, however,  the trocar did not appear to have adequate length to penetrate the abdominal wall.  This was therefore removed.  A 5 mm umbilical incision was created with a scalpel after the skin was injected with 0.25 Marcaine.  A 5  mm camera port was then placed using the Optiview. An 8 mm incision was then created in the left mid abdomen after injectioning with  0.25% Marcaine.   The 8 mm trocar was placed under visualization of the laparoscope.  An 8 mm Air Seal trocar was placed in the right mid quadrant after injecting with Marcaine and incising with a scalpel.  A 5 mm trocar was placed in the midline approximately 10 cm below the umbilical trocar after injection with 0.25% Marcaine.   Ropivicine was placed inside the peritoneal cavity after an inspection of the pelvis and abdomen was performed.   The patient was placed in Trendelenburg position. .   The bilateral ureters were identified.  The left fallopian tube was grasped and the Ligasure was used to cauterize and cut through the mesosalpinx and then the proximal tube.  The specimen was sent to pathology.  The left utero-ovarian ligament and the left round ligament were then cauterized and divided with the Ligasure instrument. Dissection was performed to the anterior and posterior leaves of the broad ligaments using the monopolar laparoscopic scissors. The incision was carried across the anterior cul- de-sac along the vesicouterine fold and the bladder was dissected away from the cervix using monopolar cautery scissors. The peritoneum was taken down posteriorly. The left uterine artery was skeletonized at this time using sharp dissection and monopolar cautery.  It was then cauterized and cut with the Ligasure instrument.  Attention was turned to the patient's right-hand side at this time. The right round ligament was cauterized and cut with the Ligasure instrument.  The peritoneum was taken down anteriorly and posteriorly with the monopolar scissors.  The bladder was taken down the monopolar scissors as well.  The KOH ring was nicely visible. The colpotomy incision was performed with the laparoscopic monopolar scissors in a circumferential fashion. The specimen was then  removed from the peritoneal cavity and was sent to Pathology. The balloon occluder was placed in the vagina. The vaginal cuff was sutured at this time using a running suture of 0 V-Loc. The vagina was closed from the patient's right hand side to the left hand side and then back 2 sutures towards the midline. This provided good full-thickness closure of the vaginal cuff.  The laparoscopic needle for suturing was removed from the peritoneal cavity.  The pelvis was irrigated and suctioned.  The pneumoperitoneal was let down.  There was good hemostasis of the operative sites and pedicles.  Arista was placed over the surgical bed.  The remaining left sided laparoscopic trocars were removed under visualization of the laparoscope. The CO2 pneumoperitoneum was released. The Airseal was used to remove any final CO2 gas, and then this trocar was removed.   The patient's 3-way Foley catheter was removed at this time and cystoscopy was performed and the findings are as noted above. The Foley catheter was left out.   Final inspection of the vagina demonstrated good hemostasis of the vaginal cuff.  The left vaginal sidewall had abrasion and laceration.  This was sutured with running locked sutures of 0/0 Vicryl.   All skin incisions were closed with subcuticular sutures of 4-0 Vicryl. The supraumbilical incision did not breach the fascia, so no fascial layer was closed.  Dermabond was  placed over the incisions.  This concluded the patient's procedure. She was extubated and escorted to the recovery room in stable and awake condition. There were no complications to the procedure. All needle, instrument, and sponge counts were correct.

## 2016-07-06 NOTE — Anesthesia Procedure Notes (Addendum)
Procedure Name: Intubation Date/Time: 07/06/2016 7:40 AM Performed by: Georgeanne Nim Pre-anesthesia Checklist: Patient identified, Emergency Drugs available, Suction available, Patient being monitored and Timeout performed Patient Re-evaluated:Patient Re-evaluated prior to inductionOxygen Delivery Method: Circle system utilized Preoxygenation: Pre-oxygenation with 100% oxygen Intubation Type: IV induction Ventilation: Mask ventilation without difficulty and Oral airway inserted - appropriate to patient size Laryngoscope Size: Mac and 3 Grade View: Grade I Tube type: Oral Tube size: 7.0 mm Number of attempts: 1 Airway Equipment and Method: Stylet (pillows under chest and head for optimal sniffing) Placement Confirmation: ETT inserted through vocal cords under direct vision,  positive ETCO2,  CO2 detector and breath sounds checked- equal and bilateral Secured at: 21 cm Tube secured with: Tape Dental Injury: Teeth and Oropharynx as per pre-operative assessment and Injury to lip  Comments: Small lip puncture from tooth making contact with oral airway noted when removing airway prior to intubation;easy intubation with no further injury noted;lubrication applied to lip with surgical lubricant

## 2016-07-07 ENCOUNTER — Encounter (HOSPITAL_COMMUNITY): Payer: Self-pay | Admitting: Obstetrics and Gynecology

## 2016-07-07 DIAGNOSIS — D649 Anemia, unspecified: Secondary | ICD-10-CM | POA: Diagnosis not present

## 2016-07-07 DIAGNOSIS — K219 Gastro-esophageal reflux disease without esophagitis: Secondary | ICD-10-CM | POA: Diagnosis not present

## 2016-07-07 DIAGNOSIS — N9971 Accidental puncture and laceration of a genitourinary system organ or structure during a genitourinary system procedure: Secondary | ICD-10-CM | POA: Diagnosis not present

## 2016-07-07 DIAGNOSIS — D259 Leiomyoma of uterus, unspecified: Secondary | ICD-10-CM | POA: Diagnosis not present

## 2016-07-07 LAB — CBC
HCT: 31.8 % — ABNORMAL LOW (ref 36.0–46.0)
HEMOGLOBIN: 10.3 g/dL — AB (ref 12.0–15.0)
MCH: 25.9 pg — ABNORMAL LOW (ref 26.0–34.0)
MCHC: 32.4 g/dL (ref 30.0–36.0)
MCV: 80.1 fL (ref 78.0–100.0)
Platelets: 291 10*3/uL (ref 150–400)
RBC: 3.97 MIL/uL (ref 3.87–5.11)
RDW: 16 % — ABNORMAL HIGH (ref 11.5–15.5)
WBC: 10.8 10*3/uL — ABNORMAL HIGH (ref 4.0–10.5)

## 2016-07-07 LAB — BASIC METABOLIC PANEL
Anion gap: 6 (ref 5–15)
BUN: 7 mg/dL (ref 6–20)
CHLORIDE: 107 mmol/L (ref 101–111)
CO2: 24 mmol/L (ref 22–32)
Calcium: 8.4 mg/dL — ABNORMAL LOW (ref 8.9–10.3)
Creatinine, Ser: 0.63 mg/dL (ref 0.44–1.00)
GFR calc Af Amer: >60 mL/min (ref >60)
GFR calc non Af Amer: >60 mL/min (ref >60)
GLUCOSE: 95 mg/dL (ref 65–99)
POTASSIUM: 3.8 mmol/L (ref 3.5–5.1)
Sodium: 137 mmol/L (ref 135–145)

## 2016-07-07 MED ORDER — TRAMADOL HCL 50 MG PO TABS: 100.0000 mg | ORAL_TABLET | Freq: Four times a day (QID) | ORAL | 0 refills | Status: DC | PRN

## 2016-07-07 NOTE — Discharge Instructions (Signed)

## 2016-07-07 NOTE — Progress Notes (Signed)
1 Day Post-Op Procedure(s) (LRB): HYSTERECTOMY TOTAL LAPAROSCOPIC WITH Left SALPINGECTOMY, Suture Left Side Wall Laceration (Left) CYSTOSCOPY (N/A)  Subjective: Patient reports incisional pain and tolerating PO.   Tramadol working well for pain control.   Objective: I have reviewed patient's vital signs, intake and output and labs. Vitals:   07/06/16 2333 07/07/16 0400  BP: (!) 119/59 (!) 118/57  Pulse: 90 71  Resp:    Temp: 99.4 F (37.4 C) 98.8 F (37.1 C)   I/O - 2560 cc/3550 cc UO  CBC    Component Value Date/Time   WBC 10.8 (H) 07/07/2016 0516   RBC 3.97 07/07/2016 0516   HGB 10.3 (L) 07/07/2016 0516   HGB 11.7 (L) 03/04/2016 1546   HCT 31.8 (L) 07/07/2016 0516   PLT 291 07/07/2016 0516   MCV 80.1 07/07/2016 0516   MCH 25.9 (L) 07/07/2016 0516   MCHC 32.4 07/07/2016 0516   RDW 16.0 (H) 07/07/2016 0516   LYMPHSABS 2.8 06/24/2016 1550   MONOABS 0.5 06/24/2016 1550   EOSABS 0.1 06/24/2016 1550   BASOSABS 0.0 06/24/2016 1550   Glucose 95. Calcium 8.4  General: alert and cooperative Resp: clear to auscultation bilaterally Cardio: regular rate and rhythm, S1, S2 normal, no murmur, click, rub or gallop GI: soft, non-tender; bowel sounds normal; no masses,  no organomegaly and incision: clean, dry and intact Extremities: Ted hose on.  Vaginal Bleeding: none  Assessment: s/p Procedure(s): HYSTERECTOMY TOTAL LAPAROSCOPIC WITH Left SALPINGECTOMY, Suture Left Side Wall Laceration (Left) CYSTOSCOPY (N/A): progressing well and ready for discharge  Plan: Discharge home  Instructions and precautions given.  Rx for Tramadol.  Procedure and findings again reviewed with patient.    LOS: 0 days    Arloa Koh 07/07/2016, 7:56 AM

## 2016-07-07 NOTE — Progress Notes (Signed)
Patient given discharge instructions and prescription.  Patient verbalizes understanding of her follow-up appointment and pain mgt. Pt verbalizes no questions at this time. 0900- patient discharged in [facility]

## 2016-07-09 ENCOUNTER — Telehealth: Payer: Self-pay | Admitting: Obstetrics and Gynecology

## 2016-07-09 NOTE — Telephone Encounter (Signed)
Received an Attending Provider Statement (form) via fax, from patients short term disability company, [employer]. Form forwarded to Dr Quincy Simmonds for review and signature.   cc: Dr Quincy Simmonds

## 2016-07-09 NOTE — Anesthesia Postprocedure Evaluation (Signed)
Anesthesia Post Note  Patient: Julia Monroe  Procedure(s) Performed: Procedure(s) (LRB): HYSTERECTOMY TOTAL LAPAROSCOPIC WITH Left SALPINGECTOMY, Suture Left Side Wall Laceration (Left) CYSTOSCOPY (N/A)     Patient location during evaluation: PACU Anesthesia Type: General Level of consciousness: awake and alert Pain management: pain level controlled Vital Signs Assessment: post-procedure vital signs reviewed and stable Respiratory status: spontaneous breathing, nonlabored ventilation, respiratory function stable and patient connected to nasal cannula oxygen Cardiovascular status: blood pressure returned to baseline and stable Postop Assessment: no signs of nausea or vomiting Anesthetic complications: no    Last Vitals:  Vitals:   07/07/16 0400 07/07/16 0800  BP: (!) 118/57 121/70  Pulse: 71 75  Resp:  18  Temp: 37.1 C 37.2 C    Last Pain:  Vitals:   07/07/16 0815  TempSrc:   PainSc: 2                  Abid Bolla EDWARD

## 2016-07-10 NOTE — Telephone Encounter (Signed)
Form signed by me.

## 2016-07-10 NOTE — Progress Notes (Signed)
GYNECOLOGY  VISIT   HPI: 46 y.o.   Single  African American  female   Julia Monroe with Patient's last menstrual period was 04/29/2016.   here for  1 week post op HYSTERECTOMY TOTAL LAPAROSCOPIC WITH Left SALPINGECTOMY, Suture Left Side Wall Laceration (Left Abdomen)  CYSTOSCOPY (N/A )  Final path - uterine fibroid and benign tube. States she did not receive her Lovenox on post op day number one prior to her discharge.   Took Tramadol this am.  None yesterday.  Not using NSAIDs as she does not tolerate them.   Has some vaginal irritation which has resolved.  No vaginal spotting.   Has some anal soreness, which is improving.  Normal BMs for the last 5 days.  No diarrhea of constipation.   Eating well.  Working on low sugar diet.  Has lost 14 pounds since dx with prediabetes.  PCP - Sadie [facility]er on [address]   GYNECOLOGIC HISTORY: Patient's last menstrual period was 04/29/2016. Contraception:  Hysterectomy Menopausal hormone therapy:  none Last mammogram:   10-21-15 Density A/Neg/biRads1:TBC Last pap smear:   08-24-15 Neg:Neg HR HPV; 09-05-12 Neg:Neg HR HPV        OB History    Gravida Para Term Preterm AB Living   1 1 1     1    SAB TAB Ectopic Multiple Live Births                     Patient Active Problem List   Diagnosis Date Noted  . Status post laparoscopic hysterectomy 07/06/2016  . Primary osteoarthritis of both knees 04/29/2016  . ANA positive 04/29/2016  . Osteoarthritis of left hip 07/10/2011    Past Medical History:  Diagnosis Date  . Arthritis    osteoarthritis  . Complication of anesthesia    bottom of feet itching  . Elevated hemoglobin A1c 2018   6.1  . Headache(784.0)    hx migraines    Past Surgical History:  Procedure Laterality Date  . CESAREAN SECTION  2000  . CYSTOSCOPY N/A 07/06/2016   Procedure: CYSTOSCOPY;  Surgeon: Nunzio Cobbs, MD;  Location: [facility];  Service: Gynecology;  Laterality: N/A;  . JOINT REPLACEMENT  L   Left  hip replacement  . OOPHORECTOMY  2007   Rt.oophorectomy   . OVARIAN CYST SURGERY  ? and 10   rt removed,cyst off lft  . TOTAL HIP ARTHROPLASTY  07/06/2011   Procedure: TOTAL HIP ARTHROPLASTY;  Surgeon: Kerin Salen, MD;  Location: [facility];  Service: Orthopedics;  Laterality: Left;  DEPUY/PINNACLE  . TOTAL LAPAROSCOPIC HYSTERECTOMY WITH SALPINGECTOMY Left 07/06/2016   Procedure: HYSTERECTOMY TOTAL LAPAROSCOPIC WITH Left SALPINGECTOMY, Suture Left Side Wall Laceration;  Surgeon: Nunzio Cobbs, MD;  Location: [facility];  Service: Gynecology;  Laterality: Left;    Current Outpatient Prescriptions  Medication Sig Dispense Refill  . Diclofenac Potassium (CAMBIA) 50 MG PACK Take 50 mg by mouth daily as needed (for migraine/headaches.). MIX THE CONTENTS OF A PACKET IN 1-2 OZ OF WATER FOR MIGRAINE HEADACHE RELIEF.    . IRON PO Take 10 mLs by mouth daily. LIQUID IRON (Ferric Glycinate) 18 mg per 10 ml    . methocarbamol (ROBAXIN) 500 MG tablet Take 500 mg by mouth every 8 (eight) hours as needed for muscle spasms.   1  . Tetrahydrozoline HCl (VISINE OP) Place 1 drop into both eyes 3 (three) times daily as needed (for irritated/dry eyes.).    Marland Kitchen  traMADol (ULTRAM) 50 MG tablet Take 2 tablets (100 mg total) by mouth every 6 (six) hours as needed for moderate pain. 30 tablet 0  . diclofenac sodium (VOLTAREN) 1 % GEL Apply 1 application topically 4 (four) times daily as needed (for pain.).   1   No current facility-administered medications for this visit.      ALLERGIES: Monosodium glutamate; Lactose intolerance (gi); Caffeine; Hydrocodone; and Oxycodone  Family History  Problem Relation Age of Onset  . Osteoarthritis Mother   . Osteoarthritis Brother   . Hypertension Brother   . Osteoarthritis Maternal Grandmother     Social History   Social History  . Marital status: Single    Spouse name: N/A  . Number of children: N/A  . Years of education: N/A   Occupational History  . Not on  file.   Social History Main Topics  . Smoking status: Never Smoker  . Smokeless tobacco: Never Used  . Alcohol use No  . Drug use: No  . Sexual activity: No   Other Topics Concern  . Not on file   Social History Narrative  . No narrative on file    ROS:  Pertinent items are noted in HPI.  PHYSICAL EXAMINATION:    BP 114/72 (BP Location: Right Arm, Patient Position: Sitting, Cuff Size: Normal)   Pulse 80   Temp 98.1 F (36.7 C) (Oral)   Resp 16   Wt (!) 332 lb (150.6 kg)   LMP 04/29/2016 Comment: Patient is going to have full hysterectomy in June  BMI 48.32 kg/m     General appearance: alert, cooperative and appears stated age   Abdomen: incisions well healed, soft, non-tender, no masses,  no organomegaly.   Pelvic:  Not examined.  Chaperone was present for exam.  ASSESSMENT  Status post laparoscopic hysterectomy with left salpingectomy, cystoscopy, and suture of left vaginal sidewall laceration.  Anemia post op.  Mildly elevated WBC count.  Elevated hemoglobin A1C.  PLAN  Surgical procedure and pathology report reviewed.  Continue decreased activity but regular walking is recommended.  OK to use Tylenol if needed.  CBC with diff today.  Patient expresses her appreciation for her care over the years.  Follow up for 6 week post op. Will do annual exam at the beginning of October 2018.    An After Visit Summary was printed and given to the patient.

## 2016-07-12 NOTE — Discharge Summary (Signed)
Physician Discharge Summary  Patient ID: Julia Monroe MRN: [id number] DOB/AGE: Dec 08, 1970 46 y.o.  Admit date: 07/06/2016 Discharge date:  07/07/16  Admission Diagnoses: 1.  Menorrhagia.  2.  Uterine fibroid.  Discharge Diagnoses:  1.  Menorrhagia.  2.  Uterine fibroid. 3.  Status post total laparoscopic hysterectomy with left salpingectomy, cystoscopy and repair of left vaginal sidewall laceration.   Active Problems:   Status post laparoscopic hysterectomy   Discharged Condition: good  Hospital Course:  The patient was admitted on 07/06/16 for a total laparoscopic hysterectomy with left salpingectomy, and cystoscopy which was complicated by a left vaginal sidewall laceration attributed to the uterine manipulator device.  The patient's post op course was uneventful.  She had a morphine IV for pain control initially, and this was converted over to Tramadol on post op day one when the patient began taking po well.  She ambulated independently and wore PAS and Ted hose for DVT prophylaxis while in bed.  She also received Lovenox for DVT prophylaxis.   She spontaneously voided good volumes of urine soon after surgery. The patient's vital signs remained stable and she demonstrated no signs of infection during her hospitalization.  The patient's post op day one Hgb was 10.3.  She was tolerating the this well.  She had very minimal vaginal bleeding, and her incisions demonstrated no signs of erythema or significant drainage.  She was found to be in good condition and ready for discharge on post op day one.   Consults: None  Significant Diagnostic Studies: labs:  See hospital course.  Treatments: surgery:   Total laparoscopic hysterectomy with left salpingectomy, cystoscopy, repair of left vaginal sidewall laceration performed 07/06/16.  Discharge Exam: Blood pressure 121/70, pulse 75, temperature 98.9 F (37.2 C), temperature source Oral, resp. rate 18, SpO2 99 %. General: alert and  cooperative Resp: clear to auscultation bilaterally Cardio: regular rate and rhythm, S1, S2 normal, no murmur, click, rub or gallop GI: soft, non-tender; bowel sounds normal; no masses,  no organomegaly and incision: clean, dry and intact Extremities: Ted hose on.  Vaginal Bleeding: none   Disposition: 01-Home or Self Care Discharge instructions were reviewed in verbal and written form.   Allergies as of 07/07/2016      Reactions   Monosodium Glutamate Other (See Comments)   Loses vision, headache/migraine   Lactose Intolerance (gi) Other (See Comments)   Gi upset   Caffeine Other (See Comments)   Severe headache    Hydrocodone Other (See Comments)   Any pain medications with Hydrocodone or oxycodone give patient a "Migraine"   Oxycodone Other (See Comments)   Headache/Migraine      Medication List    STOP taking these medications   TURMERIC PO     TAKE these medications   CAMBIA 50 MG Pack Generic drug:  Diclofenac Potassium Take 50 mg by mouth daily as needed (for migraine/headaches.). MIX THE CONTENTS OF A PACKET IN 1-2 OZ OF WATER FOR MIGRAINE HEADACHE RELIEF.   diclofenac sodium 1 % Gel Commonly known as:  VOLTAREN Apply 1 application topically 4 (four) times daily as needed (for pain.).   IRON PO Take 10 mLs by mouth daily. LIQUID IRON (Ferric Glycinate) 18 mg per 10 ml   methocarbamol 500 MG tablet Commonly known as:  ROBAXIN Take 500 mg by mouth every 8 (eight) hours as needed for muscle spasms.   traMADol 50 MG tablet Commonly known as:  ULTRAM Take 2 tablets (100 mg total) by mouth every  6 (six) hours as needed for moderate pain. What changed:  when to take this  reasons to take this   VISINE OP Place 1 drop into both eyes 3 (three) times daily as needed (for irritated/dry eyes.).      Follow-up Information    Nunzio Cobbs, MD In 1 week.   Specialty:  Obstetrics and Gynecology Contact information: [address] [place] [address] [phone]           Signed: Arloa Koh 07/12/2016, 6:38 PM

## 2016-07-13 ENCOUNTER — Encounter: Payer: Self-pay | Admitting: Obstetrics and Gynecology

## 2016-07-13 ENCOUNTER — Ambulatory Visit (INDEPENDENT_AMBULATORY_CARE_PROVIDER_SITE_OTHER): Payer: 59 | Admitting: Obstetrics and Gynecology

## 2016-07-13 VITALS — BP 114/72 | HR 80 | Temp 98.1°F | Resp 16 | Wt 332.0 lb

## 2016-07-13 DIAGNOSIS — Z9889 Other specified postprocedural states: Secondary | ICD-10-CM

## 2016-07-13 DIAGNOSIS — D649 Anemia, unspecified: Secondary | ICD-10-CM | POA: Diagnosis not present

## 2016-07-13 DIAGNOSIS — D72829 Elevated white blood cell count, unspecified: Secondary | ICD-10-CM

## 2016-07-14 LAB — CBC WITH DIFFERENTIAL/PLATELET
BASOS ABS: 0 10*3/uL (ref 0.0–0.2)
BASOS: 0 %
EOS (ABSOLUTE): 0.3 10*3/uL (ref 0.0–0.4)
Eos: 3 %
Hematocrit: 36.7 % (ref 34.0–46.6)
Hemoglobin: 11.6 g/dL (ref 11.1–15.9)
IMMATURE GRANS (ABS): 0 10*3/uL (ref 0.0–0.1)
IMMATURE GRANULOCYTES: 0 %
LYMPHS: 19 %
Lymphocytes Absolute: 2 10*3/uL (ref 0.7–3.1)
MCH: 25.4 pg — AB (ref 26.6–33.0)
MCHC: 31.6 g/dL (ref 31.5–35.7)
MCV: 81 fL (ref 79–97)
Monocytes Absolute: 0.6 10*3/uL (ref 0.1–0.9)
Monocytes: 6 %
NEUTROS PCT: 72 %
Neutrophils Absolute: 7.3 10*3/uL — ABNORMAL HIGH (ref 1.4–7.0)
PLATELETS: 327 10*3/uL (ref 150–379)
RBC: 4.56 x10E6/uL (ref 3.77–5.28)
RDW: 15.4 % (ref 12.3–15.4)
WBC: 10.2 10*3/uL (ref 3.4–10.8)

## 2016-07-14 NOTE — Telephone Encounter (Signed)
Completed "Atteding Physician Statement" faxed to [facility] (fax number [phone]) on 07/13/17. Confirmation received indicating fax transmittal was successful. Ok to close

## 2016-07-15 ENCOUNTER — Telehealth: Payer: Self-pay

## 2016-07-15 DIAGNOSIS — D72828 Other elevated white blood cell count: Secondary | ICD-10-CM

## 2016-07-15 NOTE — Telephone Encounter (Signed)
Spoke with patient. Advised of results as seen below from [facility]. Patient verbalizes understanding. Would like referral to [facility]. If he is not available will also see Dr.Gorsuch. Advised I will place referral and she will be contacted to schedule appointment date and time. Patient is agreeable.  Dr.Silva, please advise dx of referral as I cannot located elevated neutrophil as dx.

## 2016-07-15 NOTE — Telephone Encounter (Signed)
Diagnosis can be elevated white blood cell count, also called leukocytosis.  Thank you for placing the order for the referral.

## 2016-07-15 NOTE — Telephone Encounter (Signed)
-----   Message from Nunzio Cobbs, MD sent at 07/14/2016  4:51 PM EDT ----- Please report CBC with differential to patient.  Her WBC is normal but the neutrophils are elevated.  She has had mildly elevated WBC with a left shift for a little while, and I would like for her to have further evaluation.  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  I would like for her to see a hematologist/oncologist there.  We already talked about this previously.  She may have a preference for the provider she would like to see.  I have not placed the referral as I do not know her request for a provider.

## 2016-07-16 NOTE — Telephone Encounter (Signed)
Referral placed.  Routing to provider for final review. Will close encounter.

## 2016-07-28 ENCOUNTER — Encounter (HOSPITAL_COMMUNITY): Payer: Self-pay | Admitting: Obstetrics and Gynecology

## 2016-08-06 ENCOUNTER — Other Ambulatory Visit: Payer: Self-pay | Admitting: Oncology

## 2016-08-07 ENCOUNTER — Telehealth: Payer: Self-pay | Admitting: Oncology

## 2016-08-07 NOTE — Telephone Encounter (Signed)
Received a call from [facility] at the referring office to schedule the pt a hem appt w/Dr. Jana Hakim. Pt has been scheduled for labs on 7/19 at 1130am and 7/26 at 430pm to see Dr. Jana Hakim. Jacqlyn Larsen will notify the pt.

## 2016-08-12 ENCOUNTER — Other Ambulatory Visit: Payer: Self-pay | Admitting: Emergency Medicine

## 2016-08-13 ENCOUNTER — Other Ambulatory Visit: Payer: Self-pay | Admitting: Oncology

## 2016-08-13 ENCOUNTER — Ambulatory Visit (HOSPITAL_BASED_OUTPATIENT_CLINIC_OR_DEPARTMENT_OTHER): Payer: 59

## 2016-08-13 DIAGNOSIS — D72828 Other elevated white blood cell count: Secondary | ICD-10-CM

## 2016-08-13 DIAGNOSIS — D72829 Elevated white blood cell count, unspecified: Secondary | ICD-10-CM | POA: Insufficient documentation

## 2016-08-13 LAB — CBC & DIFF AND RETIC
BASO%: 0.2 % (ref 0.0–2.0)
Basophils Absolute: 0 10*3/uL (ref 0.0–0.1)
EOS ABS: 0.1 10*3/uL (ref 0.0–0.5)
EOS%: 1 % (ref 0.0–7.0)
HCT: 36.7 % (ref 34.8–46.6)
HEMOGLOBIN: 11.7 g/dL (ref 11.6–15.9)
IMMATURE RETIC FRACT: 14.6 % — AB (ref 1.60–10.00)
LYMPH%: 28 % (ref 14.0–49.7)
MCH: 25.7 pg (ref 25.1–34.0)
MCHC: 31.9 g/dL (ref 31.5–36.0)
MCV: 80.7 fL (ref 79.5–101.0)
MONO#: 0.7 10*3/uL (ref 0.1–0.9)
MONO%: 7.2 % (ref 0.0–14.0)
NEUT#: 6 10*3/uL (ref 1.5–6.5)
NEUT%: 63.6 % (ref 38.4–76.8)
PLATELETS: 277 10*3/uL (ref 145–400)
RBC: 4.55 10*6/uL (ref 3.70–5.45)
RDW: 15.9 % — ABNORMAL HIGH (ref 11.2–14.5)
Retic %: 1.9 % (ref 0.70–2.10)
Retic Ct Abs: 86.45 10*3/uL (ref 33.70–90.70)
WBC: 9.4 10*3/uL (ref 3.9–10.3)
lymph#: 2.6 10*3/uL (ref 0.9–3.3)

## 2016-08-13 LAB — FERRITIN: Ferritin: 46 ng/ml (ref 9–269)

## 2016-08-13 LAB — CHCC SMEAR

## 2016-08-14 ENCOUNTER — Encounter: Payer: Self-pay | Admitting: Obstetrics and Gynecology

## 2016-08-14 ENCOUNTER — Ambulatory Visit (INDEPENDENT_AMBULATORY_CARE_PROVIDER_SITE_OTHER): Payer: 59 | Admitting: Obstetrics and Gynecology

## 2016-08-14 VITALS — BP 116/90 | HR 70 | Resp 18 | Ht 69.5 in | Wt 336.0 lb

## 2016-08-14 DIAGNOSIS — R7309 Other abnormal glucose: Secondary | ICD-10-CM

## 2016-08-14 DIAGNOSIS — Z9889 Other specified postprocedural states: Secondary | ICD-10-CM

## 2016-08-14 LAB — SEDIMENTATION RATE: Sedimentation Rate-Westergren: 35 mm/hr — ABNORMAL HIGH (ref 0–32)

## 2016-08-14 NOTE — Progress Notes (Signed)
GYNECOLOGY  VISIT   HPI: 46 y.o.   Single  African American  female   [id number] with Patient's last menstrual period was 04/29/2016.   here for  Post op from 07/06/16  HYSTERECTOMY TOTAL LAPAROSCOPIC WITH SALPINGECTOMY  CYSTOSCOPY. Still has left ovary.  Had repair of left sidewall laceration from the uterine manipulator.  Has an appointment with Dr. Jana Hakim on July 26.  Had blood work yesterday.   Will see a new PCP at [facility] for her blood sugar control. HgbA1C was 6.1 on 06/24/16.   GYNECOLOGIC HISTORY: Patient's last menstrual period was 04/29/2016. Contraception:  Hysterectomy Menopausal hormone therapy:  none Last mammogram:  10-21-15 Density A/Neg/biRads1:TBC Last pap smear:   08-14-15 Neg:Neg HR HPV; 09-05-12 Neg:Neg HR HPV        OB History    Gravida Para Term Preterm AB Living   1 1 1     1    SAB TAB Ectopic Multiple Live Births                     Patient Active Problem List   Diagnosis Date Noted  . Leukocytosis 08/13/2016  . Status post laparoscopic hysterectomy 07/06/2016  . Primary osteoarthritis of both knees 04/29/2016  . ANA positive 04/29/2016  . Osteoarthritis of left hip 07/10/2011    Past Medical History:  Diagnosis Date  . Arthritis    osteoarthritis  . Complication of anesthesia    bottom of feet itching  . Elevated hemoglobin A1c 2018   6.1  . Headache(784.0)    hx migraines    Past Surgical History:  Procedure Laterality Date  . CESAREAN SECTION  2000  . CYSTOSCOPY N/A 07/06/2016   Procedure: CYSTOSCOPY;  Surgeon: Nunzio Cobbs, MD;  Location: [facility];  Service: Gynecology;  Laterality: N/A;  . JOINT REPLACEMENT  L   Left hip replacement  . OOPHORECTOMY  2007   Rt.oophorectomy   . OVARIAN CYST SURGERY  ? and 10   rt removed,cyst off lft  . TOTAL HIP ARTHROPLASTY  07/06/2011   Procedure: TOTAL HIP ARTHROPLASTY;  Surgeon: Kerin Salen, MD;  Location: [facility];  Service: Orthopedics;  Laterality: Left;  DEPUY/PINNACLE  . TOTAL  LAPAROSCOPIC HYSTERECTOMY WITH SALPINGECTOMY Left 07/06/2016   Procedure: HYSTERECTOMY TOTAL LAPAROSCOPIC WITH Left SALPINGECTOMY, Suture Left Side Wall Laceration;  Surgeon: Nunzio Cobbs, MD;  Location: [facility];  Service: Gynecology;  Laterality: Left;    Current Outpatient Prescriptions  Medication Sig Dispense Refill  . BLACK CURRANT SEED OIL PO Take by mouth daily.    . Tetrahydrozoline HCl (VISINE OP) Place 1 drop into both eyes 3 (three) times daily as needed (for irritated/dry eyes.).    Marland Kitchen traMADol (ULTRAM) 50 MG tablet Take 2 tablets (100 mg total) by mouth every 6 (six) hours as needed for moderate pain. 30 tablet 0  . IRON PO Take 10 mLs by mouth daily. LIQUID IRON (Ferric Glycinate) 18 mg per 10 ml    . methocarbamol (ROBAXIN) 500 MG tablet Take 500 mg by mouth every 8 (eight) hours as needed for muscle spasms.   1   No current facility-administered medications for this visit.      ALLERGIES: Monosodium glutamate; Lactose intolerance (gi); Caffeine; Hydrocodone; and Oxycodone  Family History  Problem Relation Age of Onset  . Osteoarthritis Mother   . Osteoarthritis Brother   . Hypertension Brother   . Osteoarthritis Maternal Grandmother     Social History  Social History  . Marital status: Single    Spouse name: N/A  . Number of children: N/A  . Years of education: N/A   Occupational History  . Not on file.   Social History Main Topics  . Smoking status: Never Smoker  . Smokeless tobacco: Never Used  . Alcohol use No  . Drug use: No  . Sexual activity: No   Other Topics Concern  . Not on file   Social History Narrative  . No narrative on file    ROS:  Pertinent items are noted in HPI.  PHYSICAL EXAMINATION:    BP 116/90 (BP Location: Right Arm, Patient Position: Sitting, Cuff Size: Large)   Pulse 70   Resp 18   Ht 5' 9.5" (1.765 m)   Wt (!) 336 lb (152.4 kg)   LMP 04/29/2016 Comment: Patient is going to have full hysterectomy in June   BMI 48.91 kg/m     General appearance: alert, cooperative and appears stated age   Abdomen: incisions intact, soft, non-tender, no masses,  no organomegaly   Pelvic: External genitalia:  no lesions              Urethra:  normal appearing urethra with no masses, tenderness or lesions              Bartholins and Skenes: normal                 Vagina:left vaginal sidewall sutures dissolving.  Mucosa intact.               Cervix:  Absent.  Cuff intact.                 Bimanual Exam:  Uterus:  normal size, contour, position, consistency, mobility, non-tender              Adnexa: no mass, fullness, tenderness           Chaperone was present for exam.  ASSESSMENT  Status post robotic TLH, left salpingectomy, suturing of left vaginal sidewall laceration, cystoscopy.  Elevated hemoglobin A1C. Hx elevated WBC.   PLAN  Return to work in 3 days.  Return to all normal activity in 2 weeks.  Referral to new PCP with [facility].  Follow up with Dr. Jana Hakim.  Follow up for annual exam and prn.   An After Visit Summary was printed and given to the patient.

## 2016-08-17 LAB — TECHNOLOGIST REVIEW

## 2016-08-20 ENCOUNTER — Other Ambulatory Visit: Payer: Self-pay

## 2016-08-20 ENCOUNTER — Ambulatory Visit (HOSPITAL_BASED_OUTPATIENT_CLINIC_OR_DEPARTMENT_OTHER): Payer: 59 | Admitting: Oncology

## 2016-08-20 VITALS — BP 126/80 | HR 76 | Temp 98.2°F | Resp 18 | Ht 69.5 in | Wt 338.6 lb

## 2016-08-20 DIAGNOSIS — R7303 Prediabetes: Secondary | ICD-10-CM

## 2016-08-20 DIAGNOSIS — D72828 Other elevated white blood cell count: Secondary | ICD-10-CM

## 2016-08-20 DIAGNOSIS — D649 Anemia, unspecified: Secondary | ICD-10-CM

## 2016-08-20 NOTE — Progress Notes (Signed)
Julia Monroe  [phone] [phone]     ID: Julia Monroe DOB: 10/26/1970  MR#: [id number]  IWP#:[id number]  Patient Care Team: Lucretia Kern, DO as PCP - General (Family Medicine) Yisroel Ramming, Everardo All, MD as Consulting Physician (Obstetrics and Gynecology) Bo Merino, MD as Consulting Physician (Rheumatology) [name], [name], MD as Consulting Physician (Oncology) Frederik Pear, MD as Consulting Physician (Orthopedic Surgery) Chauncey Cruel, MD OTHER MD:  CHIEF COMPLAINT: Leukocytosis  CURRENT TREATMENT: Observation   HISTORY OF PRESENT ILLNESS:  Julia Monroe has a history of significant arthritis symptoms dating back more than 20 years. She has been evaluated for this by Dr. Estanislado Pandy who obtained a very complete rheumatologic panel of laboratory studies. The ANA was negative, as we are all the other tests run except for the double stranded DNA. That test did return positive. I do not have a copy of Dr. Toma Copier note, but according to the patient the conclusion was that there was no specific diagnosis and that for the time being observation alone was adequate  More recently the patient was evaluated by Dr. Quincy Simmonds for menorrhagia. She was found to have a fibroid and on 07/06/2016 the patient underwent total laparoscopic hysterectomy with left salpingo-oophorectomy and repair of the left vaginal side wall laceration.  As part of Dr. Elza Rafter workup a leukocytosis and mild normocytic anemia was found. The differential was not informative. The patient was referred for further evaluation.  Her subsequent history is as detailed below.  INTERVAL HISTORY: Julia Monroe was evaluated in the hematology clinic 08/20/2016.  REVIEW OF SYSTEMS: She hurts "all over", but particularly her right hip, left knee, shoulders, and hands. This is a long-standing problem. It is not obviously progressive. It does worsen depending to the weather. It improves with  activity and the patient walks every morning. She takes Tylenol to help her get going and then feels better after the walk. She tried naproxen but it was less effective. In addition the patient notices that for the last 3 years when she receives her flu shot she has worsened arthritis symptoms to the point of near disability for 2 days. She is worried what will happen this winter since last winter the pain was almost intolerable. Aside from these issues a detailed review of systems today was stable  PAST MEDICAL HISTORY: Past Medical History:  Diagnosis Date  . Arthritis    osteoarthritis  . Complication of anesthesia    bottom of feet itching  . Elevated hemoglobin A1c 2018   6.1  . Headache(784.0)    hx migraines    PAST SURGICAL HISTORY: Past Surgical History:  Procedure Laterality Date  . CESAREAN SECTION  2000  . CYSTOSCOPY N/A 07/06/2016   Procedure: CYSTOSCOPY;  Surgeon: Nunzio Cobbs, MD;  Location: [facility];  Service: Gynecology;  Laterality: N/A;  . JOINT REPLACEMENT  L   Left hip replacement  . OOPHORECTOMY  2007   Rt.oophorectomy   . OVARIAN CYST SURGERY  ? and 10   rt removed,cyst off lft  . TOTAL HIP ARTHROPLASTY  07/06/2011   Procedure: TOTAL HIP ARTHROPLASTY;  Surgeon: Kerin Salen, MD;  Location: [facility];  Service: Orthopedics;  Laterality: Left;  DEPUY/PINNACLE  . TOTAL LAPAROSCOPIC HYSTERECTOMY WITH SALPINGECTOMY Left 07/06/2016   Procedure: HYSTERECTOMY TOTAL LAPAROSCOPIC WITH Left SALPINGECTOMY, Suture Left Side Wall Laceration;  Surgeon: Nunzio Cobbs, MD;  Location: [facility];  Service: Gynecology;  Laterality: Left;   FAMILY  HISTORY Family History  Problem Relation Age of Onset  . Osteoarthritis Mother   . Osteoarthritis Brother   . Hypertension Brother   . Osteoarthritis Maternal Grandmother   The patient has little information regarding her father. Her mother is alive, 59 years old as of July 2018, with a history of osteoarthritis. The  patient has 2 half sisters and one half-brother. There is no history of cancer in the family and no history of autoimmune disease that she is aware of   GYNECOLOGIC HISTORY:  Patient's last menstrual period was 04/29/2016. Menarche age 53, first live birth age 89, she is [place] P1. She is status post hysterectomy with left salpingo-oophorectomy 07/06/2016 with benign pathology.  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 The patient has no pets.     ADVANCED DIRECTIVES: The patient's health care power of attorney is Julia Monroe, her sister   HEALTH MAINTENANCE: Social History  Substance Use Topics  . Smoking status: Never Smoker  . Smokeless tobacco: Never Used  . Alcohol use No     Colonoscopy:  PAP: Status post hysterectomy  Bone density:   Allergies  Allergen Reactions  . Monosodium Glutamate Other (See Comments)    Loses vision, headache/migraine  . Lactose Intolerance (Gi) Other (See Comments)    Gi upset  . Caffeine Other (See Comments)    Severe headache   . Hydrocodone Other (See Comments)    Any pain medications with Hydrocodone or oxycodone give patient a "Migraine"  . Oxycodone Other (See Comments)    Headache/Migraine    Current Outpatient Prescriptions  Medication Sig Dispense Refill  . BLACK CURRANT SEED OIL PO Take by mouth daily.    . IRON PO Take 10 mLs by mouth daily. LIQUID IRON (Ferric Glycinate) 18 mg per 10 ml    . methocarbamol (ROBAXIN) 500 MG tablet Take 500 mg by mouth every 8 (eight) hours as needed for muscle spasms.   1  . Tetrahydrozoline HCl (VISINE OP) Place 1 drop into both eyes 3 (three) times daily as needed (for irritated/dry eyes.).    Marland Kitchen traMADol (ULTRAM) 50 MG tablet Take 2 tablets (100 mg total) by mouth every 6 (six) hours as needed for moderate pain. 30 tablet 0   No current facility-administered  medications for this visit.     OBJECTIVE: Middle-aged African-American woman in no acute distress  Vitals:   08/20/16 1637  BP: 126/80  Pulse: 76  Resp: 18  Temp: 98.2 F (36.8 C)     Body mass index is 49.29 kg/m.   Wt Readings from Last 3 Encounters:  08/20/16 (!) 338 lb 9.6 oz (153.6 kg)  08/14/16 (!) 336 lb (152.4 kg)  07/13/16 (!) 332 lb (150.6 kg)      ECOG FS:1 - Symptomatic but completely ambulatory  Ocular: Sclerae unicteric, pupils round and equal Ear-nose-throat: Oropharynx clear and moist Lymphatic: No cervical or supraclavicular adenopathy Lungs no rales or rhonchi Heart regular rate and rhythm Abd soft, nontender, positive bowel sounds MSK no focal spinal tenderness, no joint edema Neuro: non-focal, well-oriented, appropriate affect Breasts: Deferred   LAB RESULTS:  CMP     Component Value Date/Time   NA 137 07/07/2016 0516   K 3.8 07/07/2016 0516   CL 107 07/07/2016 0516   CO2 24 07/07/2016 0516   GLUCOSE 95 07/07/2016 0516  BUN 7 07/07/2016 0516   CREATININE 0.63 07/07/2016 0516   CREATININE 0.91 04/29/2016 1655   CALCIUM 8.4 (L) 07/07/2016 0516   PROT 7.1 04/29/2016 1655   ALBUMIN 3.8 04/29/2016 1655   AST 14 04/29/2016 1655   ALT 11 04/29/2016 1655   ALKPHOS 62 04/29/2016 1655   BILITOT 0.2 04/29/2016 1655   GFRNONAA >60 07/07/2016 0516   GFRNONAA 77 04/29/2016 1655   GFRAA >60 07/07/2016 0516   GFRAA 89 04/29/2016 1655    No results found for: TOTALPROTELP, ALBUMINELP, A1GS, A2GS, BETS, BETA2SER, GAMS, MSPIKE, SPEI  No results found for: Nils Pyle, [facility]  Lab Results  Component Value Date   WBC 9.4 08/13/2016   NEUTROABS 6.0 08/13/2016   HGB 11.7 08/13/2016   HCT 36.7 08/13/2016   MCV 80.7 08/13/2016   PLT 277 08/13/2016      Chemistry      Component Value Date/Time   NA 137 07/07/2016 0516   K 3.8 07/07/2016 0516   CL 107 07/07/2016 0516   CO2 24 07/07/2016 0516   BUN 7 07/07/2016 0516    CREATININE 0.63 07/07/2016 0516   CREATININE 0.91 04/29/2016 1655      Component Value Date/Time   CALCIUM 8.4 (L) 07/07/2016 0516   ALKPHOS 62 04/29/2016 1655   AST 14 04/29/2016 1655   ALT 11 04/29/2016 1655   BILITOT 0.2 04/29/2016 1655       No results found for: LABCA2  No components found for: [id number]  No results for input(s): INR in the last 168 hours.  Urinalysis    Component Value Date/Time   COLORURINE YELLOW 06/29/2011 1142   APPEARANCEUR CLEAR 06/29/2011 1142   LABSPEC 1.029 06/29/2011 1142   PHURINE 5.5 06/29/2011 1142   GLUCOSEU NEGATIVE 06/29/2011 1142   HGBUR NEGATIVE 06/29/2011 1142   BILIRUBINUR N 08/12/2015 1557   KETONESUR NEGATIVE 06/29/2011 1142   PROTEINUR N 08/12/2015 1557   PROTEINUR NEGATIVE 06/29/2011 1142   UROBILINOGEN negative 08/12/2015 1557   UROBILINOGEN 0.2 06/29/2011 1142   NITRITE N 08/12/2015 1557   NITRITE NEGATIVE 06/29/2011 1142   LEUKOCYTESUR Negative 08/12/2015 1557     STUDIES: ESR was 35.  Review of the peripheral blood film 08/20/2016 shows no left shift no significant anisocytosis, no artifactual platelet clumps. The CBC was normal numerically and morphologically  ELIGIBLE FOR AVAILABLE RESEARCH PROTOCOL: No  ASSESSMENT: 46 y.o. [place] woman with a mildly elevated ESR and positive anti-double stranded DNA antibodies in the setting of significant arthritis symptoms, but no other systemic symptoms.  PLAN: I spent approximately one hour with Julia Monroe going over her situation. It could well be that she has a subclinical autoimmune arthritis of some sort, but if so it is not diagnosable at present. I encouraged her to continue to follow with Dr. Estanislado Pandy regarding that.  As far as her white cell abnormalities and anemia, there is no evidence of malignancy and in fact her complete blood count now is both numerically and morphologically normal. The anemia may have been related to her menorrhagia and/or to her  prediabetes. The leukocytosis may be related to the possible inflammatory component of her arthritis.  In short I reassured her that I do not find any evidence of malignancy or significant autoimmune disease at present.  I encouraged her to continue her increased activity level which is the best treatment for her arthritis. It also will help with the morbid obesity and prediabetes.  The one intervention that I think may be helpful  here is the fact that she has had increasing and severe pain related to her flu shots. If this is a concern this year I will be Monroe to prescribe her a brief course of steroids at the same time that she receives the shot. I expect that will completely abrogate those symptoms.  I'm not making a return appointment for her here with me, but of course I will be Monroe to see her at any point in the future if on when the need arises.  Chauncey Cruel, MD   08/23/2016 2:09 PM Medical Oncology and Hematology [facility] [address] [place], [place] [address] Tel. [phone]    Fax. [phone]

## 2016-08-25 ENCOUNTER — Encounter: Payer: Self-pay | Admitting: Family Medicine

## 2016-08-25 ENCOUNTER — Ambulatory Visit (INDEPENDENT_AMBULATORY_CARE_PROVIDER_SITE_OTHER): Payer: 59 | Admitting: Family Medicine

## 2016-08-25 VITALS — BP 101/80 | HR 74 | Temp 98.3°F | Ht 69.5 in | Wt 336.0 lb

## 2016-08-25 DIAGNOSIS — Z7689 Persons encountering health services in other specified circumstances: Secondary | ICD-10-CM | POA: Diagnosis not present

## 2016-08-25 DIAGNOSIS — R739 Hyperglycemia, unspecified: Secondary | ICD-10-CM | POA: Diagnosis not present

## 2016-08-25 DIAGNOSIS — M1612 Unilateral primary osteoarthritis, left hip: Secondary | ICD-10-CM | POA: Diagnosis not present

## 2016-08-25 NOTE — Patient Instructions (Signed)
BEFORE YOU LEAVE: -follow up: 3 months   We recommend the following healthy lifestyle for LIFE: 1) Small portions.   Tip: eat off of a salad plate instead of a dinner plate.  Tip: if you need more or a snack choose low sugar fruits (berries), veggies and/or a handful of nuts or seeds.  2) Eat a healthy clean diet.   TRY TO EAT: -at least 5-7 servings of low sugar vegetables per day (not corn, potatoes or bananas.) -berries are the best choice if you wish to eat fruit.   -lean meets (fish, chicken or [place] breasts) -vegan proteins for some meals - beans or tofu, whole grains, nuts and seeds -Replace bad fats with good fats - good fats include: fish, nuts and seeds, canola oil, olive oil -small amounts of low fat or non fat dairy -small amounts of100 % whole grains - check the lables  AVOID: -SUGAR, sweets, anything with added sugar, corn syrup or sweeteners -if you must have a sweetener, small amounts of stevia may be best -sweetened beverages -simple starches (rice, bread, potatoes, pasta, chips, etc - small amounts of 100% whole grains are ok) -red meat, pork, butter -fried foods, fast food, processed food, excessive dairy, eggs and coconut.  3)Get at least 150 minutes of sweaty aerobic exercise per week.  4)Reduce stress - consider counseling, meditation and relaxation to balance other aspects of your life.

## 2016-08-25 NOTE — Progress Notes (Addendum)
HPI:  Julia Monroe is here to establish care. Referred by er gynecologist, Dr. Quincy Simmonds for hyperglycemia. Has been working on diet recently - started low sugar healthy diet. Reports is quite disciplined and can loose wt when eats right. OA of knees and hips. [facility] for leukocytosis.   ROS negative for unless reported above: fevers, unintentional weight loss, hearing or vision loss, chest pain, palpitations, struggling to breath, hemoptysis, melena, hematochezia, hematuria, falls, loc, si, thoughts of self harm  Past Medical History:  Diagnosis Date  . Arthritis    osteoarthritis  . Complication of anesthesia    bottom of feet itching  . Elevated hemoglobin A1c 2018   6.1  . Headache(784.0)    hx migraines    Past Surgical History:  Procedure Laterality Date  . CESAREAN SECTION  2000  . CYSTOSCOPY N/A 07/06/2016   Procedure: CYSTOSCOPY;  Surgeon: Nunzio Cobbs, MD;  Location: [facility];  Service: Gynecology;  Laterality: N/A;  . JOINT REPLACEMENT  L   Left hip replacement  . OOPHORECTOMY  2007   Rt.oophorectomy   . OVARIAN CYST SURGERY  ? and 10   rt removed,cyst off lft  . TOTAL HIP ARTHROPLASTY  07/06/2011   Procedure: TOTAL HIP ARTHROPLASTY;  Surgeon: Kerin Salen, MD;  Location: [facility];  Service: Orthopedics;  Laterality: Left;  DEPUY/PINNACLE  . TOTAL LAPAROSCOPIC HYSTERECTOMY WITH SALPINGECTOMY Left 07/06/2016   Procedure: HYSTERECTOMY TOTAL LAPAROSCOPIC WITH Left SALPINGECTOMY, Suture Left Side Wall Laceration;  Surgeon: Nunzio Cobbs, MD;  Location: [facility];  Service: Gynecology;  Laterality: Left;    Family History  Problem Relation Age of Onset  . Osteoarthritis Mother   . Osteoarthritis Brother   . Hypertension Brother   . Osteoarthritis Maternal Grandmother   . Arthritis Maternal Grandmother     Social History   Social History  . Marital status: Single    Spouse name:  N/A  . Number of children: N/A  . Years of education: N/A   Social History Main Topics  . Smoking status: Never Smoker  . Smokeless tobacco: Never Used  . Alcohol use No  . Drug use: No  . Sexual activity: No   Other Topics Concern  . None   Social History Narrative   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[employer]      Home Situation: 9 yo daughter in 2018      Spiritual Beliefs:      Lifestyle: starting healthy low sugar diet 08/2016[facility]        Current Outpatient Prescriptions:  .  BLACK CURRANT SEED OIL PO, Take by mouth daily., Disp: , Rfl:  .  IRON PO, Take 10 mLs by mouth daily. LIQUID IRON (Ferric Glycinate) 18 mg per 10 ml, Disp: , Rfl:  .  methocarbamol (ROBAXIN) 500 MG tablet, Take 500 mg by mouth every 8 (eight) hours as needed for muscle spasms. , Disp: , Rfl: 1 .  Tetrahydrozoline HCl (VISINE OP), Place 1 drop into both eyes 3 (three) times daily as needed (for irritated/dry eyes.)., Disp: , Rfl:  .  traMADol (ULTRAM) 50 MG tablet, Take 2 tablets (100 mg total) by mouth every 6 (six) hours as needed for moderate pain., Disp: 30 tablet, Rfl: 0  EXAM:  Vitals:   08/25/16 1453  BP: 101/80  Pulse: 74  Temp: 98.3 F (36.8 C)  Body mass index is 48.91 kg/m.  GENERAL: vitals reviewed and listed above, alert, oriented, appears well hydrated and in no acute distress  HEENT: atraumatic, conjunttiva clear, no obvious abnormalities on inspection of external nose and ears  NECK: no obvious masses on inspection  LUNGS: clear to auscultation bilaterally, no wheezes, rales or rhonchi, good air movement  CV: HRRR, no peripheral edema  MS: moves all extremities without noticeable abnormality  PSYCH: pleasant and cooperative, no obvious depression or anxiety  ASSESSMENT AND PLAN:  Discussed the following assessment and plan:  Hyperglycemia  Primary osteoarthritis of left hip  Morbid obesity ([place])  Establishing care with new doctor,  encounter for -We reviewed the PMH, PSH, FH, SH, Meds and Allergies. -wt reduction advised and discussed various tx options including lifelong commitment to a healthy lifestyle, low sugar healthy diet, regular exercise (water walking may be best for now), medications and bariatric surgery. -Will plan on follow up in 3 months with repeat labs then  -Patient advised to return or notify a doctor immediately if symptoms worsen or persist or new concerns arise.  Patient Instructions  BEFORE YOU LEAVE: -follow up: 3 months   We recommend the following healthy lifestyle for LIFE: 1) Small portions.   Tip: eat off of a salad plate instead of a dinner plate.  Tip: if you need more or a snack choose low sugar fruits (berries), veggies and/or a handful of nuts or seeds.  2) Eat a healthy clean diet.   TRY TO EAT: -at least 5-7 servings of low sugar vegetables per day (not corn, potatoes or bananas.) -berries are the best choice if you wish to eat fruit.   -lean meets (fish, chicken or [place] breasts) -vegan proteins for some meals - beans or tofu, whole grains, nuts and seeds -Replace bad fats with good fats - good fats include: fish, nuts and seeds, canola oil, olive oil -small amounts of low fat or non fat dairy -small amounts of100 % whole grains - check the lables  AVOID: -SUGAR, sweets, anything with added sugar, corn syrup or sweeteners -if you must have a sweetener, small amounts of stevia may be best -sweetened beverages -simple starches (rice, bread, potatoes, pasta, chips, etc - small amounts of 100% whole grains are ok) -red meat, pork, butter -fried foods, fast food, processed food, excessive dairy, eggs and coconut.  3)Get at least 150 minutes of sweaty aerobic exercise per week.  4)Reduce stress - consider counseling, meditation and relaxation to balance other aspects of your life.      Colin Benton R.

## 2016-08-26 ENCOUNTER — Ambulatory Visit: Payer: 59 | Admitting: Obstetrics and Gynecology

## 2016-08-30 ENCOUNTER — Encounter: Payer: Self-pay | Admitting: Oncology

## 2016-10-09 DIAGNOSIS — M1712 Unilateral primary osteoarthritis, left knee: Secondary | ICD-10-CM | POA: Diagnosis not present

## 2016-10-13 ENCOUNTER — Telehealth: Payer: Self-pay | Admitting: *Deleted

## 2016-10-13 ENCOUNTER — Other Ambulatory Visit: Payer: Self-pay | Admitting: *Deleted

## 2016-10-13 MED ORDER — PREDNISONE 20 MG PO TABS: 20.0000 mg | ORAL_TABLET | Freq: Every day | ORAL | 0 refills | Status: DC

## 2016-10-13 NOTE — Telephone Encounter (Signed)
Pt to start prednisone for arthralgia side effects post flu vaccine

## 2016-10-15 ENCOUNTER — Encounter: Payer: Self-pay | Admitting: Family Medicine

## 2016-10-27 DIAGNOSIS — Z96643 Presence of artificial hip joint, bilateral: Secondary | ICD-10-CM | POA: Insufficient documentation

## 2016-10-27 DIAGNOSIS — Z96642 Presence of left artificial hip joint: Secondary | ICD-10-CM | POA: Insufficient documentation

## 2016-10-27 NOTE — Progress Notes (Signed)
Office Visit Note  Patient: Julia Monroe             Date of Birth: 1970-08-27           MRN: [id number]             PCP: Lucretia Kern, DO Referring: Lucretia Kern, DO Visit Date: 10/29/2016 Occupation: @GUAROCC @    Subjective:  Pain in multiple joints.   History of Present Illness: Edyth Terrina Docter is a 46 y.o. female with history of positive ANA and double-stranded DNA. She states she's been experiencing increased pain and discomfort in multiple joints. It is difficult for her to exercise and do her routine activities. He denies any joint swelling. Her left total hip replacement is doing well.  Activities of Daily Living:  Patient reports morning stiffness for 24 hours.   Patient Reports nocturnal pain.  Difficulty dressing/grooming: Denies Difficulty climbing stairs: Reports Difficulty getting out of chair: Reports Difficulty using hands for taps, buttons, cutlery, and/or writing: Denies   Review of Systems  Constitutional: Positive for fatigue. Negative for night sweats, weight gain, weight loss and weakness.  HENT: Positive for mouth dryness. Negative for mouth sores, trouble swallowing, trouble swallowing and nose dryness.   Eyes: Positive for dryness. Negative for pain, redness and visual disturbance.  Respiratory: Negative.  Negative for cough, shortness of breath and difficulty breathing.   Cardiovascular: Negative.  Negative for chest pain, palpitations, hypertension, irregular heartbeat and swelling in legs/feet.  Gastrointestinal: Negative.  Negative for blood in stool, constipation and diarrhea.  Endocrine: Negative for increased urination.  Genitourinary: Negative for vaginal dryness.  Musculoskeletal: Positive for arthralgias and joint pain. Negative for joint swelling, myalgias, muscle weakness, morning stiffness, muscle tenderness and myalgias.  Skin: Negative.  Negative for color change, rash, hair loss, skin tightness, ulcers and sensitivity to  sunlight.  Allergic/Immunologic: Negative for susceptible to infections.  Neurological: Negative.  Negative for dizziness, numbness, headaches, memory loss and night sweats.  Hematological: Negative for swollen glands.  Psychiatric/Behavioral: Positive for sleep disturbance. Negative for depressed mood. The patient is not nervous/anxious.     PMFS History:  Patient Active Problem List   Diagnosis Date Noted  . History of total hip replacement, left 10/27/2016  . Hyperglycemia 08/25/2016  . Leukocytosis 08/13/2016  . Status post laparoscopic hysterectomy 07/06/2016  . Primary osteoarthritis of both knees 04/29/2016  . ANA positive 04/29/2016    Past Medical History:  Diagnosis Date  . Arthritis    osteoarthritis  . Complication of anesthesia    bottom of feet itching  . Elevated hemoglobin A1c 2018   6.1  . Headache(784.0)    hx migraines    Family History  Problem Relation Age of Onset  . Osteoarthritis Mother   . Osteoarthritis Brother   . Hypertension Brother   . Osteoarthritis Maternal Grandmother   . Arthritis Maternal Grandmother    Past Surgical History:  Procedure Laterality Date  . CESAREAN SECTION  2000  . CYSTOSCOPY N/A 07/06/2016   Procedure: CYSTOSCOPY;  Surgeon: Nunzio Cobbs, MD;  Location: [facility];  Service: Gynecology;  Laterality: N/A;  . JOINT REPLACEMENT  L   Left hip replacement  . OOPHORECTOMY  2007   Rt.oophorectomy   . OVARIAN CYST SURGERY  ? and 10   rt removed,cyst off lft  . TOTAL HIP ARTHROPLASTY  07/06/2011   Procedure: TOTAL HIP ARTHROPLASTY;  Surgeon: Kerin Salen, MD;  Location: [facility];  Service: Orthopedics;  Laterality: Left;  DEPUY/PINNACLE  . TOTAL LAPAROSCOPIC HYSTERECTOMY WITH SALPINGECTOMY Left 07/06/2016   Procedure: HYSTERECTOMY TOTAL LAPAROSCOPIC WITH Left SALPINGECTOMY, Suture Left Side Wall Laceration;  Surgeon: Nunzio Cobbs, MD;  Location: [facility];  Service: Gynecology;  Laterality: Left;   Social  History   Social History Narrative   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[employer]      Home Situation: 62 yo daughter in 2018      Spiritual Beliefs:      Lifestyle: starting healthy low sugar diet 08/2016/[facility]        Objective: Vital Signs: BP 119/76 (BP Location: Left Arm, Patient Position: Sitting)   Pulse 63   Ht 5\' 9"  (1.753 m)   Wt (!) 338 lb (153.3 kg)   LMP 04/29/2016 Comment: Patient is going to have full hysterectomy in June  BMI 49.91 kg/m    Physical Exam  Constitutional: She is oriented to person, place, and time. She appears well-developed and well-nourished.  HENT:  Head: Normocephalic and atraumatic.  Eyes: Conjunctivae and EOM are normal.  Neck: Normal range of motion.  Cardiovascular: Normal rate, regular rhythm, normal heart sounds and intact distal pulses.   Pulmonary/Chest: Effort normal and breath sounds normal.  Abdominal: Soft. Bowel sounds are normal.  Lymphadenopathy:    She has no cervical adenopathy.  Neurological: She is alert and oriented to person, place, and time.  Skin: Skin is warm and dry. Capillary refill takes less than 2 seconds.  Psychiatric: She has a normal mood and affect. Her behavior is normal.  Nursing note and vitals reviewed.    Musculoskeletal Exam: C-spine and thoracic lumbar spine good range of motion. Shoulder joints elbow joints wrist joint MCPs PIPs DIPs with good range of motion with no synovitis. Hip joints knee joints ankles MTPs PIPs DIPs with good range of motion with no synovitis. Although she had discomfort range of motion of all of her joints.  CDAI Exam: No CDAI exam completed.    Investigation: Findings:  04/29/2016 CMP normal, DS DNA 17 positive, RNP, SCL 70, SSA, SSB negative, anticardiolipin negative, beta-2 negative, lupus anticoagulant negative, ANA negative, C3-C4 normal    Imaging: No results found.  Speciality Comments: No specialty comments available.    Procedures:  No  procedures performed Allergies: Monosodium glutamate; Lactose intolerance (gi); Caffeine; Hydrocodone; and Oxycodone   Assessment / Plan:     Visit Diagnoses: ANA positive - History of positive ANA, DS DNA, arthralgias. No other features of autoimmune disease. Patient complains of increase arthralgias, sicca symptoms, increase fatigue. We had detailed discussion and decided to proceed with Plaquenil. Indications side effects contraindications were discussed at length. We will give her a trial of Plaquenil and see if her symptoms improve. The plan is to start her on Plaquenil 200 mg by mouth twice a day total 30 day supply with 2 refills will be given we will check labs in 1 month, 3 months and then every 5 months. She will need baseline eye exam than an eye exam on a yearly basis. She states she has an ophthalmologist.  Primary osteoarthritis of both knees: Chronic pain weight loss diet and exercise was discussed.  History of total hip replacement, left : Doing well.     Orders: Orders Placed This Encounter  Procedures  . CBC with Differential/Platelet  . COMPLETE METABOLIC PANEL WITH GFR   Meds ordered this encounter  Medications  . hydroxychloroquine (PLAQUENIL) 200 MG tablet  Sig: Take 1 tablet (200 mg total) by mouth 2 (two) times daily.    Dispense:  60 tablet    Refill:  2    Face-to-face time spent with patient was 30 minutes. Greater than 50% of time was spent in counseling and coordination of care.  Follow-Up Instructions: Return in about 3 months (around 01/29/2017) for Positive ANA, osteoarthritis.   Bo Merino, MD  Note - This record has been created using Editor, commissioning.  Chart creation errors have been sought, but may not always  have been located. Such creation errors do not reflect on  the standard of medical care.

## 2016-10-29 ENCOUNTER — Encounter: Payer: Self-pay | Admitting: Rheumatology

## 2016-10-29 ENCOUNTER — Ambulatory Visit (INDEPENDENT_AMBULATORY_CARE_PROVIDER_SITE_OTHER): Payer: 59 | Admitting: Rheumatology

## 2016-10-29 VITALS — BP 119/76 | HR 63 | Ht 69.0 in | Wt 338.0 lb

## 2016-10-29 DIAGNOSIS — R768 Other specified abnormal immunological findings in serum: Secondary | ICD-10-CM

## 2016-10-29 DIAGNOSIS — M170 Bilateral primary osteoarthritis of knee: Secondary | ICD-10-CM

## 2016-10-29 DIAGNOSIS — Z96642 Presence of left artificial hip joint: Secondary | ICD-10-CM

## 2016-10-29 DIAGNOSIS — Z79899 Other long term (current) drug therapy: Secondary | ICD-10-CM

## 2016-10-29 MED ORDER — HYDROXYCHLOROQUINE SULFATE 200 MG PO TABS: 200.0000 mg | ORAL_TABLET | Freq: Two times a day (BID) | ORAL | 2 refills | Status: DC

## 2016-10-29 NOTE — Patient Instructions (Addendum)
Hydroxychloroquine tablets What is this medicine? HYDROXYCHLOROQUINE (hye drox ee KLOR oh kwin) is used to treat rheumatoid arthritis and systemic lupus erythematosus. It is also used to treat malaria. This medicine may be used for other purposes; ask your health care provider or pharmacist if you have questions. COMMON BRAND NAME(S): Plaquenil, Quineprox What should I tell my health care provider before I take this medicine? They need to know if you have any of these conditions: -diabetes -eye disease, vision problems -G6PD deficiency -history of blood diseases -history of irregular heartbeat -if you often drink alcohol -kidney disease -liver disease -porphyria -psoriasis -seizures -an unusual or allergic reaction to chloroquine, hydroxychloroquine, other medicines, foods, dyes, or preservatives -pregnant or trying to get pregnant -breast-feeding How should I use this medicine? Take this medicine by mouth with a glass of water. Follow the directions on the prescription label. Avoid taking antacids within 4 hours of taking this medicine. It is best to separate these medicines by at least 4 hours. Do not cut, crush or chew this medicine. You can take it with or without food. If it upsets your stomach, take it with food. Take your medicine at regular intervals. Do not take your medicine more often than directed. Take all of your medicine as directed even if you think you are better. Do not skip doses or stop your medicine early. Talk to your pediatrician regarding the use of this medicine in children. While this drug may be prescribed for selected conditions, precautions do apply. Overdosage: If you think you have taken too much of this medicine contact a poison control center or emergency room at once. NOTE: This medicine is only for you. Do not share this medicine with others. What if I miss a dose? If you miss a dose, take it as soon as you can. If it is almost time for your next dose,  take only that dose. Do not take double or extra doses. What may interact with this medicine? Do not take this medicine with any of the following medications: -cisapride -dofetilide -dronedarone -live virus vaccines -penicillamine -pimozide -thioridazine -ziprasidone This medicine may also interact with the following medications: -ampicillin -antacids -cimetidine -cyclosporine -digoxin -medicines for diabetes, like insulin, glipizide, glyburide -medicines for seizures like carbamazepine, phenobarbital, phenytoin -mefloquine -methotrexate -other medicines that prolong the QT interval (cause an abnormal heart rhythm) -praziquantel This list may not describe all possible interactions. Give your health care provider a list of all the medicines, herbs, non-prescription drugs, or dietary supplements you use. Also tell them if you smoke, drink alcohol, or use illegal drugs. Some items may interact with your medicine. What should I watch for while using this medicine? Tell your doctor or healthcare professional if your symptoms do not start to get better or if they get worse. Avoid taking antacids within 4 hours of taking this medicine. It is best to separate these medicines by at least 4 hours. Tell your doctor or health care professional right away if you have any change in your eyesight. Your vision and blood may be tested before and during use of this medicine. This medicine can make you more sensitive to the sun. Keep out of the sun. If you cannot avoid being in the sun, wear protective clothing and use sunscreen. Do not use sun lamps or tanning beds/booths. What side effects may I notice from receiving this medicine? Side effects that you should report to your doctor or health care professional as soon as possible: -allergic reactions like skin rash,   itching or hives, swelling of the face, lips, or tongue -changes in vision -decreased hearing or ringing of the ears -redness,  blistering, peeling or loosening of the skin, including inside the mouth -seizures -sensitivity to light -signs and symptoms of a dangerous change in heartbeat or heart rhythm like chest pain; dizziness; fast or irregular heartbeat; palpitations; feeling faint or lightheaded, falls; breathing problems -signs and symptoms of liver injury like dark yellow or brown urine; general ill feeling or flu-like symptoms; light-colored stools; loss of appetite; nausea; right upper belly pain; unusually weak or tired; yellowing of the eyes or skin -signs and symptoms of low blood sugar such as feeling anxious; confusion; dizziness; increased hunger; unusually weak or tired; sweating; shakiness; cold; irritable; headache; blurred vision; fast heartbeat; loss of consciousness -uncontrollable head, mouth, neck, arm, or leg movements Side effects that usually do not require medical attention (report to your doctor or health care professional if they continue or are bothersome): -anxious -diarrhea -dizziness -hair loss -headache -irritable -loss of appetite -nausea, vomiting -stomach pain This list may not describe all possible side effects. Call your doctor for medical advice about side effects. You may report side effects to FDA at 1-800-FDA-1088. Where should I keep my medicine? Keep out of the reach of children. In children, this medicine can cause overdose with small doses. Store at room temperature between 15 and 30 degrees C (59 and 86 degrees F). Protect from moisture and light. Throw away any unused medicine after the expiration date. NOTE: This sheet is a summary. It may not cover all possible information. If you have questions about this medicine, talk to your doctor, pharmacist, or health care provider.  2018 Elsevier/Gold Standard (2015-08-28 14:16:15)  Standing Labs We placed an order today for your standing lab work.    Please come back and get your standing labs in 1 month, 3 months, then  every 5 months.  Please have a pneumococcal vaccine at your primary care physicians office or a [facility].  Please fax the results to our office.  We have open lab Monday through Friday from 8:30-11:30 AM and 1:30-4 PM at the office of Dr. Bo Merino.   The office is located at [address] [place], [place], [place] [address] No appointment is necessary.   Labs are drawn by [facility].  You may receive a bill from [facility] for your lab work. If you have any questions regarding directions or hours of operation,  please call [phone].

## 2016-10-30 ENCOUNTER — Telehealth: Payer: Self-pay | Admitting: *Deleted

## 2016-10-30 NOTE — Telephone Encounter (Signed)
Patient called to schedule an pneumonia shot appt. Can this be scheduled? And whom with?   Patient states her Rheumatoid arthritis doctor has prescribed her Hydroxychloroquine and this is why she is requesting this shot.   Contact number for patient [phone]

## 2016-11-01 NOTE — Telephone Encounter (Signed)
If rheumatoid doc recommends it and she doesn't have an contraindication to it - she can get it anywhere - pharmacy, here, etc/ she would have to check with insurance on coverage. Sometimes cheaper at pharmacy.

## 2016-11-02 ENCOUNTER — Telehealth: Payer: Self-pay | Admitting: Rheumatology

## 2016-11-02 DIAGNOSIS — Z23 Encounter for immunization: Secondary | ICD-10-CM | POA: Diagnosis not present

## 2016-11-02 NOTE — Telephone Encounter (Signed)
I called the pt and informed her of the message below.  Patient stated she will call her insurance company and call back for an appt if needed.

## 2016-11-02 NOTE — Telephone Encounter (Signed)
Patient needs to know which Pheumonia shot to get. Patient went to [facility], and they need to know which form to give her. Please call to advise.

## 2016-11-03 NOTE — Telephone Encounter (Signed)
She should discuss with her PCP. She may have any vaccine which is not live.

## 2016-11-03 NOTE — Telephone Encounter (Signed)
Patient advised of recommendations. Patient states she has already had the vaccination

## 2016-11-03 NOTE — Telephone Encounter (Signed)
Which pneumonia vaccine would you like the patient to have?

## 2016-11-06 ENCOUNTER — Encounter: Payer: Self-pay | Admitting: Obstetrics and Gynecology

## 2016-11-06 ENCOUNTER — Ambulatory Visit (INDEPENDENT_AMBULATORY_CARE_PROVIDER_SITE_OTHER): Payer: 59 | Admitting: Obstetrics and Gynecology

## 2016-11-06 VITALS — BP 140/70 | HR 70 | Resp 14 | Ht 69.0 in | Wt 338.0 lb

## 2016-11-06 DIAGNOSIS — Z01419 Encounter for gynecological examination (general) (routine) without abnormal findings: Secondary | ICD-10-CM | POA: Diagnosis not present

## 2016-11-06 NOTE — Progress Notes (Signed)
46 y.o. G1P1001 Single African American female here for annual exam.    Still has left ovary.   Did blood work on herself at work, the [employer]. Glucose now 93.3, WBC 8.91.  ROS -  Still with joint pain.  Seeing Dr. Corena Pilgrim.  Plaquenil helps.  PCP: Colin Benton, DO    Patient's last menstrual period was 04/29/2016.           Sexually active: Yes.   female The current method of family planning is status post hysterectomy.    Exercising: No.   Smoker:  no  Health Maintenance: Pap:   09/05/12 Neg, 08-14-15 Neg:Neg HR HPV History of abnormal Pap:  no MMG: 10-21-15 Density A/Neg/BiRads1:TBC Colonoscopy:  Never BMD:   n/a  Result  n/a TDaP:  Up to date through work Gardasil:   no JJK:KXFGH ago--neg Hep C:years ago--neg Screening Labs:   Urine today: not done   reports that she has never smoked. She has never used smokeless tobacco. She reports that she does not drink alcohol or use drugs.  Past Medical History:  Diagnosis Date  . Arthritis    osteoarthritis  . Complication of anesthesia    bottom of feet itching  . Elevated hemoglobin A1c 2018   6.1  . Headache(784.0)    hx migraines    Past Surgical History:  Procedure Laterality Date  . CESAREAN SECTION  2000  . CYSTOSCOPY N/A 07/06/2016   Procedure: CYSTOSCOPY;  Surgeon: Nunzio Cobbs, MD;  Location: [facility];  Service: Gynecology;  Laterality: N/A;  . JOINT REPLACEMENT  L   Left hip replacement  . OOPHORECTOMY  2007   Rt.oophorectomy   . OVARIAN CYST SURGERY  ? and 10   rt removed,cyst off lft  . TOTAL HIP ARTHROPLASTY  07/06/2011   Procedure: TOTAL HIP ARTHROPLASTY;  Surgeon: Kerin Salen, MD;  Location: [facility];  Service: Orthopedics;  Laterality: Left;  DEPUY/PINNACLE  . TOTAL LAPAROSCOPIC HYSTERECTOMY WITH SALPINGECTOMY Left 07/06/2016   Procedure: HYSTERECTOMY TOTAL LAPAROSCOPIC WITH Left SALPINGECTOMY, Suture Left Side Wall Laceration;  Surgeon: Nunzio Cobbs, MD;  Location: [facility];  Service: Gynecology;  Laterality: Left;    Current Outpatient Prescriptions  Medication Sig Dispense Refill  . hydroxychloroquine (PLAQUENIL) 200 MG tablet Take 1 tablet (200 mg total) by mouth 2 (two) times daily. 60 tablet 2  . IRON PO Take 10 mLs by mouth daily. LIQUID IRON (Ferric Glycinate) 18 mg per 10 ml    . methocarbamol (ROBAXIN) 500 MG tablet Take 500 mg by mouth every 8 (eight) hours as needed for muscle spasms.   1  . Tetrahydrozoline HCl (VISINE OP) Place 1 drop into both eyes 3 (three) times daily as needed (for irritated/dry eyes.).    Marland Kitchen traMADol (ULTRAM) 50 MG tablet Take 2 tablets (100 mg total) by mouth every 6 (six) hours as needed for moderate pain. 30 tablet 0   No current facility-administered medications for this visit.     Family History  Problem Relation Age of Onset  . Osteoarthritis Mother   . Osteoarthritis Brother   . Hypertension Brother   . Osteoarthritis Maternal Grandmother   . Arthritis Maternal Grandmother     ROS:  Pertinent items are noted in HPI.  Otherwise, a comprehensive ROS was negative.  Exam:   BP 140/70 (BP Location: Right Arm, Patient Position: Sitting, Cuff Size: Large)   Pulse 70   Resp 14   Ht 5\' 9"  (  1.753 m)   Wt (!) 338 lb (153.3 kg)   LMP 04/29/2016 Comment: Patient is going to have full hysterectomy in June  BMI 49.91 kg/m     General appearance: alert, cooperative and appears stated age Head: Normocephalic, without obvious abnormality, atraumatic Neck: no adenopathy, supple, symmetrical, trachea midline and thyroid normal to inspection and palpation Lungs: clear to auscultation bilaterally Breasts: normal appearance, no masses or tenderness, No nipple retraction or dimpling, No nipple discharge or bleeding, No axillary or supraclavicular adenopathy Heart: regular rate and rhythm Abdomen: soft, non-tender; no masses, no organomegaly Extremities: extremities normal, atraumatic, no cyanosis or edema Skin: Skin color,  texture, turgor normal. No rashes or lesions Lymph nodes: Cervical, supraclavicular, and axillary nodes normal. No abnormal inguinal nodes palpated Neurologic: Grossly normal  Pelvic: External genitalia:  no lesions              Urethra:  normal appearing urethra with no masses, tenderness or lesions              Bartholins and Skenes: normal                 Vagina: normal appearing vagina with normal color and discharge, no lesions              Cervix:  Absent.              Pap taken: No. Bimanual Exam:  Uterus:  Absent.               Adnexa: no mass, fullness, tenderness              Rectal exam: Yes.  .  Confirms.              Anus:  normal sphincter tone, no lesions  Chaperone was present for exam.  Assessment:   Well woman visit with normal exam. Status post RSO.  Status post total laparoscopic hysterectomy with left salpingectomy/cystoscopy.  Arthritic pain. Improved with Plaquenil.  AODM.  Diet controlled.  Plan: Mammogram screening discussed.  She will schedule. Recommended self breast awareness. Pap and HR HPV as above. Guidelines for Calcium, Vitamin D, regular exercise program including cardiovascular and weight bearing exercise. Labs with PCP.  Follow up annually and prn.   After visit summary provided.

## 2016-11-06 NOTE — Patient Instructions (Signed)

## 2016-11-24 DIAGNOSIS — H5203 Hypermetropia, bilateral: Secondary | ICD-10-CM | POA: Diagnosis not present

## 2016-11-24 DIAGNOSIS — H52223 Regular astigmatism, bilateral: Secondary | ICD-10-CM | POA: Diagnosis not present

## 2016-11-26 ENCOUNTER — Encounter: Payer: Self-pay | Admitting: Family Medicine

## 2016-11-26 ENCOUNTER — Ambulatory Visit (INDEPENDENT_AMBULATORY_CARE_PROVIDER_SITE_OTHER): Payer: 59 | Admitting: Family Medicine

## 2016-11-26 VITALS — BP 112/70 | HR 77 | Temp 98.5°F | Ht 69.0 in | Wt 335.8 lb

## 2016-11-26 DIAGNOSIS — R739 Hyperglycemia, unspecified: Secondary | ICD-10-CM | POA: Diagnosis not present

## 2016-11-26 DIAGNOSIS — M2550 Pain in unspecified joint: Secondary | ICD-10-CM | POA: Diagnosis not present

## 2016-11-26 NOTE — Patient Instructions (Signed)
BEFORE YOU LEAVE: -follow up: CPE in the morning in about 3 months, come fasting  Consider seeing Dr. Leafy Ro for help with weight loss.   We recommend the following healthy lifestyle for LIFE: 1) Small portions.   Tip: eat off of a salad plate instead of a dinner plate.  Tip: It is ok to feel hungry after a meal - that likely means you ate an appropriate portion.  Tip: if you need more or a snack choose fruits, veggies and/or a handful of nuts or seeds.  2) Eat a healthy clean diet.   TRY TO EAT: -at least 5-7 servings of low sugar vegetables per day (not corn, potatoes or bananas.) -berries are the best choice if you wish to eat fruit.   -lean meets (fish, chicken or [place] breasts) -vegan proteins for some meals - beans or tofu, whole grains, nuts and seeds -Replace bad fats with good fats - good fats include: fish, nuts and seeds, canola oil, olive oil -small amounts of low fat or non fat dairy -small amounts of100 % whole grains - check the lables  AVOID: -SUGAR, sweets, anything with added sugar, corn syrup or sweeteners -if you must have a sweetener, small amounts of stevia may be best -sweetened beverages -simple starches (rice, bread, potatoes, pasta, chips, etc - small amounts of 100% whole grains are ok) -red meat, pork, butter -fried foods, fast food, processed food, excessive dairy, eggs and coconut.  3)Get at least 150 minutes of sweaty aerobic exercise per week.  4)Reduce stress - consider counseling, meditation and relaxation to balance other aspects of your life.

## 2016-11-26 NOTE — Progress Notes (Signed)
HPI:  Follow up: Did her flu shot at work  Hyperglycemia/Morbid obesity: -she wanted to work on lifestyle changes last visit -wt 336 -->  335 -reports has really cut back on sweets, cut out sweets he -Recently started back to exercising in the gym, the last one week -watching blood sugars at home and they are ranging around 77 fasting, around 112  Polyarthralgias/+ana: -seeing rheumatologist -hx hip replacement, OA of knees -now on plaquenil    ROS: See pertinent positives and negatives per HPI.  Past Medical History:  Diagnosis Date  . Arthritis    osteoarthritis  . Complication of anesthesia    bottom of feet itching  . Elevated hemoglobin A1c 2018   6.1  . Headache(784.0)    hx migraines    Past Surgical History:  Procedure Laterality Date  . CESAREAN SECTION  2000  . CYSTOSCOPY N/A 07/06/2016   Procedure: CYSTOSCOPY;  Surgeon: Nunzio Cobbs, MD;  Location: [facility];  Service: Gynecology;  Laterality: N/A;  . JOINT REPLACEMENT  L   Left hip replacement  . OOPHORECTOMY  2007   Rt.oophorectomy   . OVARIAN CYST SURGERY  ? and 10   rt removed,cyst off lft  . TOTAL HIP ARTHROPLASTY  07/06/2011   Procedure: TOTAL HIP ARTHROPLASTY;  Surgeon: Kerin Salen, MD;  Location: [facility];  Service: Orthopedics;  Laterality: Left;  DEPUY/PINNACLE  . TOTAL LAPAROSCOPIC HYSTERECTOMY WITH SALPINGECTOMY Left 07/06/2016   Procedure: HYSTERECTOMY TOTAL LAPAROSCOPIC WITH Left SALPINGECTOMY, Suture Left Side Wall Laceration;  Surgeon: Nunzio Cobbs, MD;  Location: [facility];  Service: Gynecology;  Laterality: Left;    Family History  Problem Relation Age of Onset  . Osteoarthritis Mother   . Osteoarthritis Brother   . Hypertension Brother   . Osteoarthritis Maternal Grandmother   . Arthritis Maternal Grandmother     Social History   Social History  . Marital status: Single    Spouse name: N/A  . Number of children: N/A  . Years of education: N/A   Social  History Main Topics  . Smoking status: Never Smoker  . Smokeless tobacco: Never Used  . Alcohol use No  . Drug use: No  . Sexual activity: Yes    Partners: Male    Birth control/ protection: Surgical     Comment: Hyst   Other Topics Concern  . 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  hydroxychloroquine (PLAQUENIL) 200 MG tablet, Take 1 tablet (200 mg total) by mouth 2 (two) times daily., Disp: 60 tablet, Rfl: 2 .  IRON PO, Take 10 mLs by mouth daily. LIQUID IRON (Ferric Glycinate) 18 mg per 10 ml, Disp: , Rfl:  .  methocarbamol (ROBAXIN) 500 MG tablet, Take 500 mg by mouth every 8 (eight) hours as needed for muscle spasms. , Disp: , Rfl: 1 .  Tetrahydrozoline HCl (VISINE OP), Place 1 drop into both eyes 3 (three) times daily as needed (for irritated/dry eyes.)., Disp: , Rfl:  .  traMADol (ULTRAM) 50 MG tablet, Take 2 tablets (100 mg total) by mouth every 6 (six) hours as needed for moderate pain., Disp: 30 tablet, Rfl: 0  EXAM:  Vitals:   11/26/16 1641  BP: 112/70  Pulse: 77  Temp: 98.5 F (36.9 C)    Body mass index is 49.59 kg/m.  GENERAL: vitals reviewed and listed above, alert, oriented, appears well hydrated and in no acute distress  HEENT: atraumatic, conjunttiva clear, no obvious abnormalities on inspection of external nose and ears  NECK: no obvious masses on inspection  LUNGS: clear to auscultation bilaterally, no wheezes, rales or rhonchi, good air movement  CV: HRRR, no peripheral edema  MS: moves all extremities without noticeable abnormality  PSYCH: pleasant and cooperative, no obvious depression or anxiety  ASSESSMENT AND PLAN:  Discussed the following assessment and plan:  Hyperglycemia  Morbid obesity  (HCC)  Polyarthralgia  -lifestyle recommendations discussed at length, congratulated her on her changes stopped about options for weight management - she would like to try nonsurgical, non-pharmacological measures -Discussed Dr. Migdalia Dk weight management clinic and she may consider this -Handout with a healthy diet plan provided - I think that a healthy Mediterranean style diet will help her both with weight loss and with her arthralgias -I am glad that she is able to start exercising again -we'll check labs at physical in a few months -Patient advised to return or notify a doctor immediately if symptoms worsen or persist or new concerns arise.  Patient Instructions  BEFORE YOU LEAVE: -follow up: CPE in the morning in about 3 months, come fasting  Consider seeing Dr. Leafy Ro for help with weight loss.   We recommend the following healthy lifestyle for LIFE: 1) Small portions.   Tip: eat off of a salad plate instead of a dinner plate.  Tip: It is ok to feel hungry after a meal - that likely means you ate an appropriate portion.  Tip: if you need more or a snack choose fruits, veggies and/or a handful of nuts or seeds.  2) Eat a healthy clean diet.   TRY TO EAT: -at least 5-7 servings of low sugar vegetables per day (not corn, potatoes or bananas.) -berries are the best choice if you wish to eat fruit.   -lean meets (fish, chicken or [place] breasts) -vegan proteins for some meals - beans or tofu, whole grains, nuts and seeds -Replace bad fats with good fats - good fats include: fish, nuts and seeds, canola oil, olive oil -small amounts of low fat or non fat dairy -small amounts of100 % whole grains - check the lables  AVOID: -SUGAR, sweets, anything with added sugar, corn syrup or sweeteners -if you must have a sweetener, small amounts of stevia may be best -sweetened beverages -simple starches (rice, bread, potatoes, pasta, chips, etc - small amounts of 100% whole grains are  ok) -red meat, pork, butter -fried foods, fast food, processed food, excessive dairy, eggs and coconut.  3)Get at least 150 minutes of sweaty aerobic exercise per week.  4)Reduce stress - consider counseling, meditation and relaxation to balance other aspects of your life.     Colin Benton R., DO

## 2017-01-12 ENCOUNTER — Other Ambulatory Visit: Payer: Self-pay | Admitting: Family Medicine

## 2017-01-12 ENCOUNTER — Ambulatory Visit: Payer: Self-pay

## 2017-01-12 NOTE — Telephone Encounter (Signed)
Copied from [place] [id number]. Topic: Quick Communication - See Telephone Encounter >> Jan 12, 2017 12:42 PM Synthia Innocent wrote: CRM for notification. See Telephone encounter for: Requesting to have cambia called in. Please advise [facility], pt has already contacted pharmacy 01/12/17.

## 2017-01-12 NOTE — Telephone Encounter (Signed)
I called the pt and informed her of the message below.  Patient scheduled an appt for 12/21.

## 2017-01-12 NOTE — Telephone Encounter (Signed)
This is a duplicate note, see previous one.  

## 2017-01-12 NOTE — Telephone Encounter (Signed)
This medication is an NSAID - similar to ibuprofen, aleve, etc. But, should have appt for eval  if feels needs prescription medication for it, severe, other symptoms, etc. UCC if needs eval today. O/w I could see her Thursday or could see another provider here tomorrow.

## 2017-01-12 NOTE — Telephone Encounter (Signed)
Triaged pt. Request for [facility] sent through triage note.

## 2017-01-12 NOTE — Telephone Encounter (Signed)
Pt requesting Cambia (diclofenac potassium). Pt has a h/o migraines and is having one now. States pain is a 6/10 States the last time she had one was 4 years ago. She states that she is not aware what could be causing it today but stated MSG and caffeine has caused it in the past. States pain is located on the right side toward the front to the right temple. Pt states she was originally prescribed drug by a provider at [facility]. Care advice given. Pt states understanding.  Pt requesting Cambia called in to [employer].  Reason for Disposition . Similar to previously diagnosed migraine headaches  Answer Assessment - Initial Assessment Questions 1. LOCATION: "Where does it hurt?"      Right side toward the front  2. ONSET: "When did the headache start?" (Minutes, hours or days)      2 hours ago 3. PATTERN: "Does the pain come and go, or has it been constant since it started?"     Pain has been constant. The aura has passed.  4. SEVERITY: "How bad is the pain?" and "What does it keep you from doing?"  (e.g., Scale 1-10; mild, moderate, or severe)   - MILD (1-3): doesn't interfere with normal activities    - MODERATE (4-7): interferes with normal activities or awakens from sleep    - SEVERE (8-10): excruciating pain, unable to do any normal activities        6/10 Moderate 5. RECURRENT SYMPTOM: "Have you ever had headaches before?" If so, ask: "When was the last time?" and "What happened that time?"       Yes. The last time approximately 4 years ago. Took Cambia and that helped. 6. CAUSE: "What do you think is causing the headache?"     Pt states she is not sure. In the past MSG and too much caffeine has induced migraines  7. MIGRAINE: "Have you been diagnosed with migraine headaches?" If so, ask: "Is this headache similar?"     Yes- this headache is similar 8. HEAD INJURY: "Has there been any recent injury to the head?"      No recent injuries to the head 9. OTHER SYMPTOMS: "Do you  have any other symptoms?" (fever, stiff neck, eye pain, sore throat, cold symptoms)     No fever, stiff neck, eyepain, cold sx 10. PREGNANCY: "Is there any chance you are pregnant?" "When was your last menstrual period?"       No s/p hysterectomy  Protocols used: HEADACHE-A-AH

## 2017-01-13 NOTE — Progress Notes (Signed)
Office Visit Note  Patient: Julia Monroe             Date of Birth: [birth year]           MRN: [id number]             PCP: Lucretia Kern, DO Referring: Lucretia Kern, DO Visit Date: 01/27/2017 Occupation: _0 @    Subjective:  Right hip pain   History of Present Illness: Imagine Khali Perella is a 46 y.o. female with history of autoimmune disease. She was started on Plaquenil last visit. She states after taking Plaquenil for one month she noticed decreased joint pain and discomfort. Over time her joint pain has improved a lot. She continues to have some discomfort in her right hip joint which needs to be replaced. She also has discomfort in her bilateral knee joints with intermittent swelling. Her left total hip replacement is doing well. The rash on her left forearm resolved after starting Plaquenil.  Activities of Daily Living:  Patient reports morning stiffness for 0 minute.   Patient Reports nocturnal pain. Right hip Difficulty dressing/grooming: Denies Difficulty climbing stairs: Reports Difficulty getting out of chair: Reports Difficulty using hands for taps, buttons, cutlery, and/or writing: Denies   Review of Systems  Constitutional: Positive for fatigue. Negative for night sweats, weight gain, weight loss and weakness.  HENT: Negative for mouth sores, trouble swallowing, trouble swallowing, mouth dryness and nose dryness.   Eyes: Negative for pain, redness, visual disturbance and dryness.  Respiratory: Negative for cough, shortness of breath and difficulty breathing.   Cardiovascular: Negative for chest pain, palpitations, hypertension, irregular heartbeat and swelling in legs/feet.  Gastrointestinal: Negative for blood in stool, constipation and diarrhea.  Endocrine: Negative for increased urination.  Genitourinary: Negative for vaginal dryness.  Musculoskeletal: Positive for arthralgias and joint pain. Negative for joint swelling, myalgias, muscle weakness,  morning stiffness, muscle tenderness and myalgias.  Skin: Negative for color change, rash, hair loss, skin tightness, ulcers and sensitivity to sunlight.  Allergic/Immunologic: Negative for susceptible to infections.  Neurological: Negative for dizziness, memory loss and night sweats.  Hematological: Negative for swollen glands.  Psychiatric/Behavioral: Positive for sleep disturbance. Negative for depressed mood. The patient is not nervous/anxious.        Nocturnal pain    PMFS History:  Patient Active Problem List   Diagnosis Date Noted  . Class 3 severe obesity due to excess calories without serious comorbidity with body mass index (BMI) of 50.0 to 59.9 in adult ([place]) 01/27/2017  . History of total hip replacement, left 10/27/2016  . Hyperglycemia 08/25/2016  . Leukocytosis 08/13/2016  . Status post laparoscopic hysterectomy 07/06/2016  . Primary osteoarthritis of both knees 04/29/2016  . ANA positive 04/29/2016    Past Medical History:  Diagnosis Date  . Arthritis    osteoarthritis  . Complication of anesthesia    bottom of feet itching  . Elevated hemoglobin A1c 2018   6.1  . Headache(784.0)    hx migraines    Family History  Problem Relation Age of Onset  . Osteoarthritis Mother   . Osteoarthritis Brother   . Hypertension Brother   . Osteoarthritis Maternal Grandmother   . Arthritis Maternal Grandmother    Past Surgical History:  Procedure Laterality Date  . ABDOMINAL HYSTERECTOMY    . CESAREAN SECTION  2000  . CYSTOSCOPY N/A 07/06/2016   Procedure: CYSTOSCOPY;  Surgeon: Nunzio Cobbs, MD;  Location: [facility];  Service: Gynecology;  Laterality: N/A;  .  JOINT REPLACEMENT  L   Left hip replacement  . OOPHORECTOMY  2007   Rt.oophorectomy   . OVARIAN CYST SURGERY  ? and 10   rt removed,cyst off lft  . TOTAL HIP ARTHROPLASTY  07/06/2011   Procedure: TOTAL HIP ARTHROPLASTY;  Surgeon: Kerin Salen, MD;  Location: [facility];  Service: Orthopedics;  Laterality:  Left;  DEPUY/PINNACLE  . 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 She appears well-developed and well-nourished.  HENT:  Head: Normocephalic and atraumatic.  Eyes: Conjunctivae and EOM are normal.  Neck: Normal range of motion.  Cardiovascular: Normal rate, regular rhythm, normal heart sounds and intact distal pulses.  Pulmonary/Chest: Effort normal and breath sounds normal.  Abdominal: Soft. Bowel sounds are normal.  Lymphadenopathy:    She has no cervical adenopathy.  Neurological: She is alert and oriented to person, place, and time.  Skin: Skin is warm and dry. Capillary refill takes less than 2 seconds.  Psychiatric: She has a normal mood and affect. Her behavior is normal.  Nursing note and vitals reviewed.    Musculoskeletal Exam: C-spine and thoracic spine and lumbar spine good range of motion. She is some discomfort range of motion of thoracic spine. Shoulder joints elbow joints wrist joint MCPs PIPs DIPs with good range of motion with no synovitis. Hip joints have limited range of motion with discomfort in range of motion of her right hip joint. Left hip joint is  replaced without much discomfort on range of motion. She is osteoarthritis in her bilateral knee joints with crepitus without any warmth swelling or effusion.   CDAI Exam: No CDAI exam completed.    Investigation: No additional findings. CBC Latest Ref Rng & Units 08/13/2016 07/13/2016 07/07/2016  WBC 3.9 - 10.3 10e3/uL 9.4 10.2 10.8(H)  Hemoglobin 11.6 - 15.9 g/dL 11.7 11.6 10.3(L)  Hematocrit 34.8 - 46.6 % 36.7 36.7 31.8(L)  Platelets 145 - 400 10e3/uL 277 327 291   CMP Latest Ref Rng & Units 07/07/2016 07/06/2016 06/24/2016  Glucose 65 - 99 mg/dL 95 - 178(H)  BUN 6 - 20 mg/dL 7 - 14  Creatinine 0.44 - 1.00 mg/dL 0.63 0.70 0.73  Sodium 135 - 145 mmol/L 137 - 140  Potassium 3.5 - 5.1 mmol/L 3.8 - 3.9  Chloride 101 - 111 mmol/L 107 - 108  CO2 22 - 32 mmol/L 24 - 23  Calcium 8.9 - 10.3 mg/dL 8.4(L) - 8.8(L)  Total Protein 6.1 - 8.1 g/dL - - -  Total Bilirubin 0.2 - 1.2 mg/dL - - -  Alkaline Phos 33 - 115  U/L - - -  AST 10 - 30 U/L - - -  ALT 6 - 29 U/L - - -    Imaging: No results found.  Speciality Comments: PLQ eye exam: 11/24/2016 Normal. Dr. Thalia Bloodgood. Follow up in 1 year.    Procedures:  No procedures performed Allergies: Monosodium glutamate; Lactose intolerance (gi); Caffeine; Hydrocodone; and Oxycodone   Assessment / Plan:     Visit Diagnoses: systemic lupus erythematosus - History of positive ANA, DS DNA, elevated ESR, arthralgias, rash. She's doing much better on Plaquenil. Her arthralgias have resolved. The rash he had on her left forearm is resolved. She is overall feeling better. She's been tolerating Plaquenil well. She states her eye exam was normal.  High risk medication use - PLQ 200 mg po bideye exam: 11/24/2016  - Plan: CBC with Differential/Platelet, COMPLETE METABOLIC PANEL WITH GFR today and then every 5 months.  Other fatigue - Plan: VITAMIN D 25 Hydroxy (Vit-D Deficiency, Fractures)  Primary osteoarthritis of both knees: She continues to have some  discomfort in her bilateral knee joints.  Primary osteoarthritis of right hip: She has severe pain and discomfort and is difficulty walking. She also has nocturnal pain. She is planning to have right total hip replacement in future after weight loss.  History of total hip replacement, left: Doing well  Class 3 severe obesity due to excess calories without serious comorbidity with body mass index (BMI) of 50.0 to 59.9 in adult [facility]) : Weight loss diet and exercise was discussed at length.   Orders: Orders Placed This Encounter  Procedures  . CBC with Differential/Platelet  . COMPLETE METABOLIC PANEL WITH GFR  . VITAMIN D 25 Hydroxy (Vit-D Deficiency, Fractures)   No orders of the defined types were placed in this encounter.   Face-to-face time spent with patient was 30 minutes. Greater than 50% of time was spent in counseling and coordination of care.  Follow-Up Instructions: Return in about 4 months (around 05/27/2017) for Systemic lupus.   Bo Merino, MD  Note - This record has been created using Editor, commissioning.  Chart creation errors have been sought, but may not always  have been located. Such creation errors do not reflect on  the standard of medical care.

## 2017-01-15 ENCOUNTER — Telehealth: Payer: Self-pay | Admitting: Family Medicine

## 2017-01-15 ENCOUNTER — Encounter: Payer: Self-pay | Admitting: Family Medicine

## 2017-01-15 ENCOUNTER — Ambulatory Visit (INDEPENDENT_AMBULATORY_CARE_PROVIDER_SITE_OTHER): Payer: 59 | Admitting: Family Medicine

## 2017-01-15 VITALS — BP 124/78 | HR 83 | Temp 98.2°F | Ht 69.0 in

## 2017-01-15 DIAGNOSIS — G43109 Migraine with aura, not intractable, without status migrainosus: Secondary | ICD-10-CM | POA: Diagnosis not present

## 2017-01-15 MED ORDER — DICLOFENAC POTASSIUM(MIGRAINE) 50 MG PO PACK: 50.0000 mg | PACK | Freq: Once | ORAL | 0 refills | Status: AC

## 2017-01-15 MED ORDER — ONDANSETRON 4 MG PO TBDP: 4.0000 mg | ORAL_TABLET | Freq: Three times a day (TID) | ORAL | 0 refills | Status: DC | PRN

## 2017-01-15 MED ORDER — DICLOFENAC POTASSIUM(MIGRAINE) 50 MG PO PACK
50.0000 mg | PACK | Freq: Once | ORAL | 0 refills | Status: DC
Start: 2017-01-15 — End: 2017-01-15

## 2017-01-15 NOTE — Progress Notes (Signed)
HPI:  Acute visit for migraines: -reports started in grade school, mother has them -unchanged, 1-2 times a year, unilat with nausea, sometimes vomiting, sometimes visual disturbance, aura -had one a few days ago and realized she was out of cambia - rxd by her migraine specialist in the past - reports worked the best for her without side effects, knows not to take with other anti-inflammatories   -sometimes has to leave work if bad - about 1-2 times per year -no sig change -use to keep something on hand for these for nausea as well  ROS: See pertinent positives and negatives per HPI.  Past Medical History:  Diagnosis Date  . Arthritis    osteoarthritis  . Complication of anesthesia    bottom of feet itching  . Elevated hemoglobin A1c 2018   6.1  . Headache(784.0)    hx migraines    Past Surgical History:  Procedure Laterality Date  . CESAREAN SECTION  2000  . CYSTOSCOPY N/A 07/06/2016   Procedure: CYSTOSCOPY;  Surgeon: Nunzio Cobbs, MD;  Location: [facility];  Service: Gynecology;  Laterality: N/A;  . JOINT REPLACEMENT  L   Left hip replacement  . OOPHORECTOMY  2007   Rt.oophorectomy   . OVARIAN CYST SURGERY  ? and 10   rt removed,cyst off lft  . TOTAL HIP ARTHROPLASTY  07/06/2011   Procedure: TOTAL HIP ARTHROPLASTY;  Surgeon: Kerin Salen, MD;  Location: [facility];  Service: Orthopedics;  Laterality: Left;  DEPUY/PINNACLE  . TOTAL LAPAROSCOPIC HYSTERECTOMY WITH SALPINGECTOMY Left 07/06/2016   Procedure: HYSTERECTOMY TOTAL LAPAROSCOPIC WITH Left SALPINGECTOMY, Suture Left Side Wall Laceration;  Surgeon: Nunzio Cobbs, MD;  Location: [facility];  Service: Gynecology;  Laterality: Left;    Family History  Problem Relation Age of Onset  . Osteoarthritis Mother   . Osteoarthritis Brother   . Hypertension Brother   . Osteoarthritis Maternal Grandmother   . Arthritis Maternal Grandmother     Social History   Socioeconomic History  . Marital status: Single    Spouse name: None  . Number of children: None  . Years of education: None  . Highest education level: None  Social Needs  . Financial resource strain: None  . Food insecurity - worry: None  . Food insecurity - inability: None  . Transportation needs - medical: None  . Transportation needs - non-medical: None  Occupational History  . None  Tobacco Use  . Smoking status: Never Smoker  . Smokeless tobacco: Never Used  Substance and Sexual Activity  . Alcohol use: No    Alcohol/week: 0.0 oz  . Drug use: No  . Sexual activity: Yes    Partners: Male    Birth control/protection: Surgical    Comment: Hyst  Other Topics Concern  . 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  hydroxychloroquine (PLAQUENIL) 200 MG tablet, Take 1 tablet (200 mg total) by mouth 2 (two) times daily., Disp: 60 tablet, Rfl: 2 .  IRON PO, Take 10 mLs by mouth daily. LIQUID IRON (Ferric Glycinate) 18 mg per 10 ml, Disp: , Rfl:  .  methocarbamol (ROBAXIN) 500 MG tablet, Take 500 mg by mouth every 8 (eight) hours  as needed for muscle spasms. , Disp: , Rfl: 1 .  Tetrahydrozoline HCl (VISINE OP), Place 1 drop into both eyes 3 (three) times daily as needed (for irritated/dry eyes.)., Disp: , Rfl:  .  traMADol (ULTRAM) 50 MG tablet, Take 2 tablets (100 mg total) by mouth every 6 (six) hours as needed for moderate pain., Disp: 30 tablet, Rfl: 0 .  Diclofenac Potassium (CAMBIA) 50 MG PACK, Take 50 mg by mouth once for 1 dose., Disp: 9 each, Rfl: 0 .  ondansetron (ZOFRAN ODT) 4 MG disintegrating tablet, Take 1 tablet (4 mg total) by mouth every 8 (eight) hours as needed for nausea or vomiting., Disp: 20 tablet, Rfl: 0  EXAM:  Vitals:   01/15/17 1340  BP: 124/78  Pulse: 83  Temp: 98.2 F (36.8  C)    Body mass index is 49.59 kg/m.  GENERAL: vitals reviewed and listed above, alert, oriented, appears well hydrated and in no acute distress  HEENT: atraumatic, conjunttiva clear, no obvious abnormalities on inspection of external nose and ears  NECK: no obvious masses on inspection  LUNGS: clear to auscultation bilaterally, no wheezes, rales or rhonchi, good air movement  CV: HRRR, no peripheral edema  MS: moves all extremities without noticeable abnormality  PSYCH/NEURO: pleasant and cooperative, no obvious depression or anxiety, CN II-XII grossly intact, finger to nose normal, speech and thought processing grossly intact  ASSESSMENT AND PLAN:  Discussed the following assessment and plan:  Migraine with aura and without status migrainosus, not intractable  -discussed risks various options for migraines, she prefers to use cambia and rare zofran - rx sent -she requests FMLA - reports 1-2 times per year with migraine may need to leave work as has nausea with vomiting and visual disturbance (knows not to drive), also feels tired the next day and wants to rest -advised I do not usually write FMLA for migraines -but given her nausea, vomiting and visual disturbance, we can complete FMLA paperwork for rare occasions that she reports are 1-2 times per year for the day she is having those symptoms.  Advise she do not drive when she is having a visual disturbance.  Discussed if her symptoms worsen or become more frequent she should see a neurologist.  Discussed that our neurologist here do not do FMLA for migraines from my past experience-at least according to prior.  She may consider following up with her prior migraine specialist, particularly if her headaches worsen or change. -Patient advised to return or notify a doctor immediately if symptoms worsen or persist or new concerns arise.  Patient Instructions  BEFORE YOU LEAVE: -follow up: has physical in February  - we will provide  lab orders that day (this is not just a lab visit - it is the yearly physical)  Sent prescriptions for cambia and zofran  Keep migraine journal on triggers list  Follow up sooner if worsening or new concerns    Colin Benton R., DO

## 2017-01-15 NOTE — Telephone Encounter (Signed)
Copied from [place] [id number]. Topic: Quick Communication - See Telephone Encounter >> Jan 15, 2017  2:37 PM Ether Griffins B wrote: CRM for notification. See Telephone encounter for:  The two medications that were called in [facility] and they needed [facility] go to [facility] pt pharmacy. [facility]'t use [facility]  01/15/17.

## 2017-01-15 NOTE — Patient Instructions (Addendum)
BEFORE YOU LEAVE: -follow up: has physical in February  - we will provide lab orders that day (this is not just a lab visit - it is the yearly physical)  Sent prescriptions for cambia and zofran  Keep migraine journal on triggers list  Follow up sooner if worsening or new concerns

## 2017-01-15 NOTE — Telephone Encounter (Signed)
Rxs re-sent to [facility].

## 2017-01-27 ENCOUNTER — Ambulatory Visit (INDEPENDENT_AMBULATORY_CARE_PROVIDER_SITE_OTHER): Payer: 59 | Admitting: Rheumatology

## 2017-01-27 ENCOUNTER — Encounter: Payer: Self-pay | Admitting: Rheumatology

## 2017-01-27 VITALS — BP 134/70 | HR 84 | Resp 19 | Ht 69.0 in | Wt 339.0 lb

## 2017-01-27 DIAGNOSIS — Z6841 Body Mass Index (BMI) 40.0 and over, adult: Secondary | ICD-10-CM

## 2017-01-27 DIAGNOSIS — Z79899 Other long term (current) drug therapy: Secondary | ICD-10-CM

## 2017-01-27 DIAGNOSIS — M170 Bilateral primary osteoarthritis of knee: Secondary | ICD-10-CM | POA: Diagnosis not present

## 2017-01-27 DIAGNOSIS — Z96642 Presence of left artificial hip joint: Secondary | ICD-10-CM | POA: Diagnosis not present

## 2017-01-27 DIAGNOSIS — R5383 Other fatigue: Secondary | ICD-10-CM

## 2017-01-27 DIAGNOSIS — M328 Other forms of systemic lupus erythematosus: Secondary | ICD-10-CM

## 2017-01-27 DIAGNOSIS — M1611 Unilateral primary osteoarthritis, right hip: Secondary | ICD-10-CM

## 2017-01-28 ENCOUNTER — Telehealth: Payer: Self-pay | Admitting: *Deleted

## 2017-01-28 DIAGNOSIS — E559 Vitamin D deficiency, unspecified: Secondary | ICD-10-CM

## 2017-01-28 LAB — COMPLETE METABOLIC PANEL WITH GFR
AG RATIO: 1.4 (calc) (ref 1.0–2.5)
ALT: 7 U/L (ref 6–29)
AST: 11 U/L (ref 10–30)
Albumin: 4 g/dL (ref 3.6–5.1)
Alkaline phosphatase (APISO): 53 U/L (ref 33–115)
BUN: 11 mg/dL (ref 7–25)
CALCIUM: 9 mg/dL (ref 8.6–10.2)
CO2: 26 mmol/L (ref 20–32)
CREATININE: 0.7 mg/dL (ref 0.50–1.10)
Chloride: 104 mmol/L (ref 98–110)
GFR, EST NON AFRICAN AMERICAN: 105 mL/min/{1.73_m2} (ref >=60)
GFR, Est African American: 122 mL/min/{1.73_m2} (ref >=60)
Globulin: 2.9 g/dL (calc) (ref 1.9–3.7)
Glucose, Bld: 92 mg/dL (ref 65–99)
POTASSIUM: 4.3 mmol/L (ref 3.5–5.3)
Sodium: 138 mmol/L (ref 135–146)
Total Bilirubin: 0.3 mg/dL (ref 0.2–1.2)
Total Protein: 6.9 g/dL (ref 6.1–8.1)

## 2017-01-28 LAB — CBC WITH DIFFERENTIAL/PLATELET
Basophils Absolute: 52 cells/uL (ref 0–200)
Basophils Relative: 0.6 %
EOS PCT: 0.8 %
Eosinophils Absolute: 70 cells/uL (ref 15–500)
HCT: 38.4 % (ref 35.0–45.0)
Hemoglobin: 12.4 g/dL (ref 11.7–15.5)
Lymphs Abs: 1731 cells/uL (ref 850–3900)
MCH: 25.7 pg — ABNORMAL LOW (ref 27.0–33.0)
MCHC: 32.3 g/dL (ref 32.0–36.0)
MCV: 79.7 fL — ABNORMAL LOW (ref 80.0–100.0)
MONOS PCT: 6.8 %
MPV: 9.8 fL (ref 7.5–12.5)
NEUTROS PCT: 71.9 %
Neutro Abs: 6255 cells/uL (ref 1500–7800)
PLATELETS: 329 10*3/uL (ref 140–400)
RBC: 4.82 10*6/uL (ref 3.80–5.10)
RDW: 14.5 % (ref 11.0–15.0)
TOTAL LYMPHOCYTE: 19.9 %
WBC mixed population: 592 cells/uL (ref 200–950)
WBC: 8.7 10*3/uL (ref 3.8–10.8)

## 2017-01-28 LAB — VITAMIN D 25 HYDROXY (VIT D DEFICIENCY, FRACTURES): Vit D, 25-Hydroxy: 14 ng/mL — ABNORMAL LOW (ref 30–100)

## 2017-01-28 MED ORDER — VITAMIN D (ERGOCALCIFEROL) 1.25 MG (50000 UNIT) PO CAPS: 50000.0000 [IU] | ORAL_CAPSULE | ORAL | 0 refills | Status: DC

## 2017-01-28 NOTE — Telephone Encounter (Signed)
-----   Message from Bo Merino, MD sent at 01/28/2017  8:36 AM EST ----- Vit D 50,000 U twice a wk for 3 months . Repeat labs in 3 months.

## 2017-01-28 NOTE — Progress Notes (Signed)
Vit D 50,000 U twice a wk for 3 months . Repeat labs in 3 months.

## 2017-02-04 ENCOUNTER — Encounter: Payer: Self-pay | Admitting: Family Medicine

## 2017-02-04 ENCOUNTER — Other Ambulatory Visit: Payer: Self-pay | Admitting: Rheumatology

## 2017-02-04 NOTE — Telephone Encounter (Signed)
Last Visit: 01/27/17 Next Visit: 06/01/17 Labs: 01/27/17 cbc/cmp stable PLQ Eye Exam: 11/24/2016  WNL  Okay to refill per Dr. Estanislado Pandy

## 2017-03-09 NOTE — Progress Notes (Signed)
HPI:  Here for CPE:  -Concerns and/or follow up today:   Julia Monroe is a pleasant 47 y.o. here for follow up and CPE. Chronic medical problems summarized below were reviewed for changes.  She reports she is doing well.  She started Plaquenil with her rheumatologist and has had much less pain in her joints.  She has a goal to try to lose 30 pounds over the next months.  She is considering a hip replacement with her orthopedic doctor, but wants to get her weight down first.  Portion size is the main issue with her diet. Denies CP, SOB, DOE, treatment intolerance or new symptoms.  Hyperglycemia/Morbid obesity: -she wanted to work on lifestyle changes last visit -wt 336 -->  335 (11/18) --> 333 (2/19) -reports has really cut back on sweets, cut out sweets he -Trying to get regular exercise -watching blood sugars at work and reports they have been pretty good  Polyarthralgias/+ana: -seeing rheumatologist -hx hip replacement, OA of knees -now on plaquenil   Migraines: -reports started in grade school, mother has them -unchanged, 1-2 times a year, unilat with nausea, sometimes vomiting, sometimes visual disturbance, aura -meds: zofran occ, cambia - rxd by her migraine specialist in the past - reports worked the best for her without side effects, knows not to take with other anti-inflammatories                  -sometimes has to leave work if bad - about 1-2 times per year -no sig change   -Diet: variety of foods, balance and well rounded, larger portion sizes -Exercise: no regular exercise -Taking folic acid, vitamin D or calcium: no -Diabetes and Dyslipidemia Screening: Fasting -Vaccines: see vaccine section EPIC -pap history: 07/2015 with Dr. Quincy Simmonds, gynecology, normal per physician notes with negative HPV -FDLMP: see nursing notes -sexual activity: yes, female partner, no new partners -wants STI testing (Hep C if born [birth year]): no -FH breast, colon or ovarian ca: see  FH Last mammogram: sees gyn Last colon cancer screening: n/a Breast Ca Risk Assessment: see family history and pt history DEXA (>/= 47): n/a  -Alcohol, Tobacco, drug use: see social history  Review of Systems - no fevers, unintentional weight loss, vision loss, hearing loss, chest pain, sob, hemoptysis, melena, hematochezia, hematuria, genital discharge, changing or concerning skin lesions, bleeding, bruising, loc, thoughts of self harm or SI  Past Medical History:  Diagnosis Date  . Arthritis    osteoarthritis  . Complication of anesthesia    bottom of feet itching  . Elevated hemoglobin A1c 2018   6.1  . Headache(784.0)    hx migraines    Past Surgical History:  Procedure Laterality Date  . ABDOMINAL HYSTERECTOMY    . CESAREAN SECTION  2000  . CYSTOSCOPY N/A 07/06/2016   Procedure: CYSTOSCOPY;  Surgeon: Nunzio Cobbs, MD;  Location: [facility];  Service: Gynecology;  Laterality: N/A;  . JOINT REPLACEMENT  L   Left hip replacement  . OOPHORECTOMY  2007   Rt.oophorectomy   . OVARIAN CYST SURGERY  ? and 10   rt removed,cyst off lft  . TOTAL HIP ARTHROPLASTY  07/06/2011   Procedure: TOTAL HIP ARTHROPLASTY;  Surgeon: Kerin Salen, MD;  Location: [facility];  Service: Orthopedics;  Laterality: Left;  DEPUY/PINNACLE  . TOTAL LAPAROSCOPIC HYSTERECTOMY WITH SALPINGECTOMY Left 07/06/2016   Procedure: HYSTERECTOMY TOTAL LAPAROSCOPIC WITH Left SALPINGECTOMY, Suture Left Side Wall Laceration;  Surgeon: Nunzio Cobbs, MD;  Location:  [facility];  Service: Gynecology;  Laterality: Left;    Family History  Problem Relation Age of Onset  . Osteoarthritis Mother   . Osteoarthritis Brother   . Hypertension Brother   . Osteoarthritis Maternal Grandmother   . Arthritis Maternal Grandmother     Social History   Socioeconomic History  . Marital status: Single    Spouse name: None  . Number of children: None  . Years of education: None  . Highest education level: None   Social Needs  . Financial resource strain: None  . Food insecurity - worry: None  . Food insecurity - inability: None  . Transportation needs - medical: None  . Transportation needs - non-medical: None  Occupational History  . None  Tobacco Use  . Smoking status: Never Smoker  . Smokeless tobacco: Never Used  Substance and Sexual Activity  . Alcohol use: No    Alcohol/week: 0.0 oz  . Drug use: No  . Sexual activity: Yes    Partners: Male    Birth control/protection: Surgical    Comment: Hyst  Other Topics Concern  . 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  acetaminophen (TYLENOL) 500 MG tablet, Take 500 mg by mouth as needed., Disp: , Rfl:  .  CAMBIA 50 MG PACK, as needed., Disp: , Rfl: 0 .  DiphenhydrAMINE HCl (ALKA-SELTZER PLUS ALLERGY PO), Take by mouth as needed., Disp: , Rfl:  .  hydroxychloroquine (PLAQUENIL) 200 MG tablet, TAKE 1 TABLET BY MOUTH TWICE DAILY, Disp: 60 tablet, Rfl: 2 .  IRON PO, Take 10 mLs by mouth daily. LIQUID IRON (Ferric Glycinate) 18 mg per 10 ml, Disp: , Rfl:  .  methocarbamol (ROBAXIN) 500 MG tablet, Take 500 mg by mouth every 8 (eight) hours as needed for muscle spasms. , Disp: , Rfl: 1 .  ondansetron (ZOFRAN ODT) 4 MG disintegrating tablet, Take 1 tablet (4 mg total) by mouth every 8 (eight) hours as needed for nausea or vomiting., Disp: 20 tablet, Rfl: 0 .  Tetrahydrozoline HCl (VISINE OP), Place 1 drop into both eyes 3 (three) times daily as needed (for irritated/dry eyes.)., Disp: , Rfl:  .  traMADol (ULTRAM) 50 MG tablet, Take 2 tablets (100 mg total) by mouth every 6 (six) hours as needed for moderate pain., Disp: 30 tablet, Rfl: 0 .  TURMERIC PO, Take by mouth daily., Disp: , Rfl:  .  Vitamin D, Ergocalciferol,  (DRISDOL) 50000 units CAPS capsule, Take 1 capsule (50,000 Units total) by mouth 2 (two) times a week., Disp: 24 capsule, Rfl: 0  EXAM:  Vitals:   03/11/17 0709  BP: 112/76  Pulse: 69  Temp: 97.9 F (36.6 C)   Body mass index is 47.91 kg/m.  GENERAL: vitals reviewed and listed below, alert, oriented, appears well hydrated and in no acute distress  HEENT: head atraumatic, PERRLA, normal appearance of eyes, ears, nose and mouth. moist mucus membranes.  NECK: supple, no masses or lymphadenopathy  LUNGS: clear to auscultation bilaterally, no rales, rhonchi or wheeze  CV: HRRR, no peripheral edema or cyanosis, normal pedal pulses  ABDOMEN: bowel sounds normal, soft, non tender to palpation, no masses, no rebound or guarding  GU/BREAST: Declined, does with  GYN  SKIN: no rash or abnormal lesions  MS: normal gait, moves all extremities normally  NEURO: normal gait, speech and thought processing grossly intact, muscle tone grossly intact throughout  PSYCH: normal affect, pleasant and cooperative  ASSESSMENT AND PLAN:  Discussed the following assessment and plan:  PREVENTIVE EXAM: -Discussed and advised all [place] preventive services health task force level A and B recommendations for age, sex and risks. -Advised at least 150 minutes of exercise per week and a healthy diet with avoidance of (less then 1 serving per week) processed foods, white starches, red meat, fast foods and sweets and consisting of: * 5-9 servings of fresh fruits and vegetables (not corn or potatoes) *nuts and seeds, beans *olives and olive oil *lean meats such as fish and white chicken  *whole grains -labs, studies and vaccines per orders this encounter  2. Screening for depression -Negative  3. Hyperglycemia - Hemoglobin A1c -Discussed components of a healthy diet that assist in glucose metabolism and maintaining a healthy blood sugar level and reducing the chance of progression to diabetes  4.  Arthritis -Seeing rheumatologist for management, I am glad that she is feeling better  5. Obesity, morbid, BMI 40.0-49.9 ([place]) -Lengthy discussion about a healthy diet -whole foods based, Mediterranean-style regular consumption of fermented foods as well to promote good got health -We will give her the number for Dr. Migdalia Dk office again she is interested in seeing an obesity specialist for further help - Lipid panel   Patient advised to return to clinic immediately if symptoms worsen or persist or new concerns.  Patient Instructions  BEFORE YOU LEAVE: -labs -follow up: 4-6 months  We have ordered labs or studies at this visit. It can take up to 1-2 weeks for results and processing. IF results require follow up or explanation, we will call you with instructions. Clinically stable results will be released to your [facility]. If you have not heard from [place] or cannot find your results in [facility] in 2 weeks please contact our office at [phone].  If you are not yet signed up for [facility], please consider signing up.   Preventive Care 18-39 Years, Female Preventive care refers to lifestyle choices and visits with your health care provider that can promote health and wellness. What does preventive care include?  A yearly physical exam. This is also called an annual well check.  Dental exams once or twice a year.  Routine eye exams. Ask your health care provider how often you should have your eyes checked.  Personal lifestyle choices, including: ? Daily care of your teeth and gums. ? Regular physical activity. ? Eating a healthy diet. ? Avoiding tobacco and drug use. ? Limiting alcohol use. ? Practicing safe sex. ? Taking vitamin and mineral supplements as recommended by your health care provider. What happens during an annual well check? The services and screenings done by your health care provider during your annual well check will depend on your age, overall health, lifestyle risk  factors, and family history of disease. Counseling Your health care provider may ask you questions about your:  Alcohol use.  Tobacco use.  Drug use.  Emotional well-being.  Home and relationship well-being.  Sexual activity.  Eating habits.  Work and work Statistician.  Method of birth control.  Menstrual cycle.  Pregnancy history.  Screening You may have the following tests or measurements:  Height, weight, and BMI.  Diabetes screening. This is done by checking your blood sugar (glucose) after you have not  eaten for a while (fasting).  Blood pressure.  Lipid and cholesterol levels. These may be checked every 5 years starting at age 65.  Skin check.  Hepatitis C blood test.  Hepatitis B blood test.  Sexually transmitted disease (STD) testing.  BRCA-related cancer screening. This may be done if you have a family history of breast, ovarian, tubal, or peritoneal cancers.  Pelvic exam and Pap test. This may be done every 3 years starting at age 50. Starting at age 38, this may be done every 5 years if you have a Pap test in combination with an HPV test.  Discuss your test results, treatment options, and if necessary, the need for more tests with your health care provider. Vaccines Your health care provider may recommend certain vaccines, such as:  Influenza vaccine. This is recommended every year.  Tetanus, diphtheria, and acellular pertussis (Tdap, Td) vaccine. You may need a Td booster every 10 years.  Varicella vaccine. You may need this if you have not been vaccinated.  HPV vaccine. If you are 54 or younger, you may need three doses over 6 months.  Measles, mumps, and rubella (MMR) vaccine. You may need at least one dose of MMR. You may also need a second dose.  Pneumococcal 13-valent conjugate (PCV13) vaccine. You may need this if you have certain conditions and were not previously vaccinated.  Pneumococcal polysaccharide (PPSV23) vaccine. You may  need one or two doses if you smoke cigarettes or if you have certain conditions.  Meningococcal vaccine. One dose is recommended if you are age 96-21 years and a first-year college student living in a residence hall, or if you have one of several medical conditions. You may also need additional booster doses.  Hepatitis A vaccine. You may need this if you have certain conditions or if you travel or work in places where you may be exposed to hepatitis A.  Hepatitis B vaccine. You may need this if you have certain conditions or if you travel or work in places where you may be exposed to hepatitis B.  Haemophilus influenzae type b (Hib) vaccine. You may need this if you have certain risk factors.  Talk to your health care provider about which screenings and vaccines you need and how often you need them. This information is not intended to replace advice given to you by your health care provider. Make sure you discuss any questions you have with your health care provider. Document Released: 03/10/2001 Document Revised: 10/02/2015 Document Reviewed: 11/13/2014 Elsevier Interactive Patient Education  2018 [employer].          No Follow-up on file.  Lucretia Kern, DO

## 2017-03-11 ENCOUNTER — Encounter: Payer: Self-pay | Admitting: Family Medicine

## 2017-03-11 ENCOUNTER — Ambulatory Visit (INDEPENDENT_AMBULATORY_CARE_PROVIDER_SITE_OTHER): Payer: 59 | Admitting: Family Medicine

## 2017-03-11 VITALS — BP 112/76 | HR 69 | Temp 97.9°F | Ht 70.0 in | Wt 333.9 lb

## 2017-03-11 DIAGNOSIS — R739 Hyperglycemia, unspecified: Secondary | ICD-10-CM | POA: Diagnosis not present

## 2017-03-11 DIAGNOSIS — Z1331 Encounter for screening for depression: Secondary | ICD-10-CM

## 2017-03-11 DIAGNOSIS — M1990 Unspecified osteoarthritis, unspecified site: Secondary | ICD-10-CM | POA: Diagnosis not present

## 2017-03-11 DIAGNOSIS — Z0000 Encounter for general adult medical examination without abnormal findings: Secondary | ICD-10-CM | POA: Diagnosis not present

## 2017-03-11 LAB — LIPID PANEL
Cholesterol: 148 mg/dL (ref 0–200)
HDL: 47 mg/dL (ref >39.00)
LDL Cholesterol: 90 mg/dL (ref 0–99)
NONHDL: 100.73
Total CHOL/HDL Ratio: 3
Triglycerides: 54 mg/dL (ref 0.0–149.0)
VLDL: 10.8 mg/dL (ref 0.0–40.0)

## 2017-03-11 LAB — HEMOGLOBIN A1C: HEMOGLOBIN A1C: 6 % (ref 4.6–6.5)

## 2017-03-11 NOTE — Patient Instructions (Addendum)
BEFORE YOU LEAVE: -labs -follow up: 4-6 months  We have ordered labs or studies at this visit. It can take up to 1-2 weeks for results and processing. IF results require follow up or explanation, we will call you with instructions. Clinically stable results will be released to your [facility]. If you have not heard from [place] or cannot find your results in [facility] in 2 weeks please contact our office at [phone].  If you are not yet signed up for [facility], please consider signing up.   Preventive Care 18-39 Years, Female Preventive care refers to lifestyle choices and visits with your health care provider that can promote health and wellness. What does preventive care include?  A yearly physical exam. This is also called an annual well check.  Dental exams once or twice a year.  Routine eye exams. Ask your health care provider how often you should have your eyes checked.  Personal lifestyle choices, including: ? Daily care of your teeth and gums. ? Regular physical activity. ? Eating a healthy diet. ? Avoiding tobacco and drug use. ? Limiting alcohol use. ? Practicing safe sex. ? Taking vitamin and mineral supplements as recommended by your health care provider. What happens during an annual well check? The services and screenings done by your health care provider during your annual well check will depend on your age, overall health, lifestyle risk factors, and family history of disease. Counseling Your health care provider may ask you questions about your:  Alcohol use.  Tobacco use.  Drug use.  Emotional well-being.  Home and relationship well-being.  Sexual activity.  Eating habits.  Work and work Statistician.  Method of birth control.  Menstrual cycle.  Pregnancy history.  Screening You may have the following tests or measurements:  Height, weight, and BMI.  Diabetes screening. This is done by checking your blood sugar (glucose) after you have not eaten for  a while (fasting).  Blood pressure.  Lipid and cholesterol levels. These may be checked every 5 years starting at age 67.  Skin check.  Hepatitis C blood test.  Hepatitis B blood test.  Sexually transmitted disease (STD) testing.  BRCA-related cancer screening. This may be done if you have a family history of breast, ovarian, tubal, or peritoneal cancers.  Pelvic exam and Pap test. This may be done every 3 years starting at age 23. Starting at age 17, this may be done every 5 years if you have a Pap test in combination with an HPV test.  Discuss your test results, treatment options, and if necessary, the need for more tests with your health care provider. Vaccines Your health care provider may recommend certain vaccines, such as:  Influenza vaccine. This is recommended every year.  Tetanus, diphtheria, and acellular pertussis (Tdap, Td) vaccine. You may need a Td booster every 10 years.  Varicella vaccine. You may need this if you have not been vaccinated.  HPV vaccine. If you are 65 or younger, you may need three doses over 6 months.  Measles, mumps, and rubella (MMR) vaccine. You may need at least one dose of MMR. You may also need a second dose.  Pneumococcal 13-valent conjugate (PCV13) vaccine. You may need this if you have certain conditions and were not previously vaccinated.  Pneumococcal polysaccharide (PPSV23) vaccine. You may need one or two doses if you smoke cigarettes or if you have certain conditions.  Meningococcal vaccine. One dose is recommended if you are age 55-21 years and a Market researcher living  in a residence hall, or if you have one of several medical conditions. You may also need additional booster doses.  Hepatitis A vaccine. You may need this if you have certain conditions or if you travel or work in places where you may be exposed to hepatitis A.  Hepatitis B vaccine. You may need this if you have certain conditions or if you travel or  work in places where you may be exposed to hepatitis B.  Haemophilus influenzae type b (Hib) vaccine. You may need this if you have certain risk factors.  Talk to your health care provider about which screenings and vaccines you need and how often you need them. This information is not intended to replace advice given to you by your health care provider. Make sure you discuss any questions you have with your health care provider. Document Released: 03/10/2001 Document Revised: 10/02/2015 Document Reviewed: 11/13/2014 Elsevier Interactive Patient Education  [employer].

## 2017-04-13 DIAGNOSIS — M1712 Unilateral primary osteoarthritis, left knee: Secondary | ICD-10-CM | POA: Diagnosis not present

## 2017-05-13 ENCOUNTER — Other Ambulatory Visit: Payer: Self-pay | Admitting: Rheumatology

## 2017-05-17 NOTE — Telephone Encounter (Signed)
Last Visit: 01/27/17 Next Visit: 06/01/17 Labs: 01/27/17 cbc/cmp stable PLQ Eye Exam: 11/24/2016 WNL  Okay to refill per Dr. Estanislado Pandy

## 2017-05-19 NOTE — Progress Notes (Signed)
Office Visit Note  Patient: Julia Monroe             Date of Birth: [birth year]           MRN: [id number]             PCP: Lucretia Kern, DO Referring: Lucretia Kern, DO Visit Date: 06/01/2017 Occupation: '@GUAROCC'$ @    Subjective: Fatigue   History of Present Illness: Julia Monroe is a 47 y.o. female with history of systemic lupus erythematosus and osteoarthritis.  She continues to take Plaquenil 200 mg twice daily.  Patient states that she has been having increased joint pain and fatigue.  Her pain is most severe in her right hip.  Her left hip replacement is doing well.  She states that she has also periodically having sores in her mouth.  She denies any sores in her nose.  She states that the sores in her mouth it typically resolve after 1 day.  She denies any facial rashes.  She states that she developed a red spot on her right hand which is now hyperpigmented.  She denies any other lesions or rashes.  She denies any swollen lymph nodes.  She denies any low-grade fevers.  She denies any swelling in her joints.  Reports that her knee joints, hands, and feet are doing well.  She denies any hair loss.  She denies any photosensitivity.  She has any sicca symptoms or symptoms of Raynaud's.  She has been taking vitamin D 3000 units once daily.   Activities of Daily Living:  Patient reports morning stiffness for   all day.   Patient Reports nocturnal pain.  Difficulty dressing/grooming: Denies Difficulty climbing stairs: Reports Difficulty getting out of chair: Reports Difficulty using hands for taps, buttons, cutlery, and/or writing: Denies   Review of Systems  Constitutional: Positive for fatigue.  HENT: Positive for mouth sores. Negative for mouth dryness and nose dryness.   Eyes: Negative for pain, visual disturbance and dryness.  Respiratory: Negative for cough, hemoptysis, shortness of breath and difficulty breathing.   Cardiovascular: Negative for chest pain,  palpitations, hypertension and swelling in legs/feet.  Gastrointestinal: Negative for blood in stool, constipation and diarrhea.  Endocrine: Negative for increased urination.  Genitourinary: Negative for painful urination.  Musculoskeletal: Positive for arthralgias, joint pain and morning stiffness. Negative for joint swelling, myalgias, muscle weakness, muscle tenderness and myalgias.  Skin: Negative for color change, pallor, rash, hair loss, nodules/bumps, skin tightness, ulcers and sensitivity to sunlight.  Allergic/Immunologic: Negative for susceptible to infections.  Neurological: Negative for dizziness, numbness, headaches and weakness.  Hematological: Negative for swollen glands.  Psychiatric/Behavioral: Negative for depressed mood and sleep disturbance. The patient is not nervous/anxious.     PMFS History:  Patient Active Problem List   Diagnosis Date Noted  . Class 3 severe obesity due to excess calories without serious comorbidity with body mass index (BMI) of 50.0 to 59.9 in adult ([place]) 01/27/2017  . History of total hip replacement, left 10/27/2016  . Hyperglycemia 08/25/2016  . Leukocytosis 08/13/2016  . Status post laparoscopic hysterectomy 07/06/2016  . Primary osteoarthritis of both knees 04/29/2016  . ANA positive 04/29/2016  . Primary osteoarthritis of right hip 07/10/2011    Past Medical History:  Diagnosis Date  . Arthritis    osteoarthritis  . Complication of anesthesia    bottom of feet itching  . Elevated hemoglobin A1c 2018   6.1  . Headache(784.0)    hx migraines  Family History  Problem Relation Age of Onset  . Osteoarthritis Mother   . Osteoarthritis Brother   . Hypertension Brother   . Osteoarthritis Maternal Grandmother   . Arthritis Maternal Grandmother    Past Surgical History:  Procedure Laterality Date  . ABDOMINAL HYSTERECTOMY    . CESAREAN SECTION  2000  . CYSTOSCOPY N/A 07/06/2016   Procedure: CYSTOSCOPY;  Surgeon: Nunzio Cobbs, MD;  Location: [facility];  Service: Gynecology;  Laterality: N/A;  . JOINT REPLACEMENT  L   Left hip replacement  . OOPHORECTOMY  2007   Rt.oophorectomy   . OVARIAN CYST SURGERY  ? and 10   rt removed,cyst off lft  . TOTAL HIP ARTHROPLASTY  07/06/2011   Procedure: TOTAL HIP ARTHROPLASTY;  Surgeon: Kerin Salen, MD;  Location: [facility];  Service: Orthopedics;  Laterality: Left;  DEPUY/PINNACLE  . TOTAL LAPAROSCOPIC HYSTERECTOMY WITH SALPINGECTOMY Left 07/06/2016   Procedure: HYSTERECTOMY TOTAL LAPAROSCOPIC WITH Left SALPINGECTOMY, Suture Left Side Wall Laceration;  Surgeon: Nunzio Cobbs, MD;  Location: [facility];  Service: Gynecology;  Laterality: Left;   Social History   Social History Narrative   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[employer]      Home Situation: 17 yo daughter in 2018      Spiritual Beliefs:      Lifestyle: starting healthy low sugar diet 08/2016[facility]     Objective: Vital Signs: BP 130/88 (BP Location: Left Wrist, Patient Position: Sitting, Cuff Size: Normal)   Pulse 74   Resp 18   Ht '5\' 9"'$  (1.753 m)   Wt (!) 345 lb (156.5 kg)   LMP 04/29/2016   BMI 50.95 kg/m    Physical Exam  Constitutional: She is oriented to person, place, and time. She appears well-developed and well-nourished.  HENT:  Head: Normocephalic and atraumatic.  Eyes: Conjunctivae and EOM are normal.  Neck: Normal range of motion.  Cardiovascular: Normal rate, regular rhythm, normal heart sounds and intact distal pulses.  Pulmonary/Chest: Effort normal and breath sounds normal.  Abdominal: Soft. Bowel sounds are normal.  Lymphadenopathy:    She has no cervical adenopathy.  Neurological: She is alert and oriented to person, place, and time.  Skin: Skin is warm and dry. Capillary refill takes less than 2 seconds.  Psychiatric: She has a normal mood and affect. Her behavior is normal.  Nursing note and vitals reviewed.    Musculoskeletal Exam: C-spine,  thoracic spine, lumbar spine good range of motion.  No midline spinal tenderness.  No SI joint tenderness.  Shoulder joints, elbow joints, wrist joints, MCPs, PIPs, DIPs good range of motion with no synovitis. Knee joints, ankle joints, MTPs, PIPs, DIPs good range of motion with no synovitis.  No warmth or effusion of bilateral knees. She has bilateral knee crepitus.  No tenderness of trochanteric bursa.  She has limited range of motion of right hip joint.  Left hip good range of motion with no discomfort.  CDAI Exam: No CDAI exam completed.    Investigation: No additional findings.PLQ eye exam: 11/24/2016 CBC Latest Ref Rng & Units 01/27/2017 08/13/2016 07/13/2016  WBC 3.8 - 10.8 Thousand/uL 8.7 9.4 10.2  Hemoglobin 11.7 - 15.5 g/dL 12.4 11.7 11.6  Hematocrit 35.0 - 45.0 % 38.4 36.7 36.7  Platelets 140 - 400 Thousand/uL 329 277 327   CMP Latest Ref Rng & Units 01/27/2017 07/07/2016 07/06/2016  Glucose 65 - 99 mg/dL 92 95 -  BUN 7 - 25  mg/dL 11 7 -  Creatinine 0.50 - 1.10 mg/dL 0.70 0.63 0.70  Sodium 135 - 146 mmol/L 138 137 -  Potassium 3.5 - 5.3 mmol/L 4.3 3.8 -  Chloride 98 - 110 mmol/L 104 107 -  CO2 20 - 32 mmol/L 26 24 -  Calcium 8.6 - 10.2 mg/dL 9.0 8.4(L) -  Total Protein 6.1 - 8.1 g/dL 6.9 - -  Total Bilirubin 0.2 - 1.2 mg/dL 0.3 - -  Alkaline Phos 33 - 115 U/L - - -  AST 10 - 30 U/L 11 - -  ALT 6 - 29 U/L 7 - -    Imaging: No results found.  Speciality Comments: PLQ eye exam: 11/24/2016 Normal. Dr. Thalia Bloodgood. Follow up in 1 year.    Procedures:  No procedures performed Allergies: Monosodium glutamate; Lactose intolerance (gi); Caffeine; Hydrocodone; and Oxycodone   Assessment / Plan:     Visit Diagnoses: Other systemic lupus erythematosus with other organ involvement ([place]) - History of positive ANA, DS DNA, elevated ESR, arthralgias, rash: She gives a history of mouth ulcerations, worsening fatigue, and joint pain.  She has no synovitis on exam.  She has no signs of  oral or nasal ulcers on exam.  She has no cervical lymphadenopathy.  She has sicca symptoms or symptoms of Raynauds.  No digital ulcerations noted. No recent low grade fevers. No malar rash noted.  She gives a history of a red spot that is now hyperpigmented on the dorsal aspect of her right hand.  She has no other rashes or lesions at this time. She takes Plaquenil 200 mg twice daily.  We will check her autoimmune labs today.  A refill of Plaquenil was sent to the pharmacy.- Plan: Urinalysis, Routine w reflex microscopic, ANA, Anti-DNA antibody, double-stranded, C3 and C4, Sedimentation rate, VITAMIN D 25 Hydroxy (Vit-D Deficiency, Fractures)  High risk medication use - PLQ 200 mg po bid. eye exam: 11/24/2016.  Primary osteoarthritis of both knees: No warmth or effusion of knee joints. She has bilateral knee crepitus.  Her pain is most significant when she is going downstairs.  Primary osteoarthritis of right hip: Chronic pain  She has a right hip total arthroplasty scheduled for December 2019.  She has limited range of motion with discomfort.  History of total hip replacement, left: Doing well.  She is good range of motion with no discomfort.  Other fatigue: Chronic and worsening.  History of obesity  Vitamin D deficiency -Her last vitamin D was 14 on 01/27/2017.  She was taking 50,000 units twice a week but ran out of her prescription.  She has been taking vitamin D 3000 units daily.  We will recheck her vitamin D level today.  Plan: VITAMIN D 25 Hydroxy (Vit-D Deficiency, Fractures)    Orders: Orders Placed This Encounter  Procedures  . Urinalysis, Routine w reflex microscopic  . ANA  . Anti-DNA antibody, double-stranded  . C3 and C4  . Sedimentation rate  . VITAMIN D 25 Hydroxy (Vit-D Deficiency, Fractures)   Meds ordered this encounter  Medications  . hydroxychloroquine (PLAQUENIL) 200 MG tablet    Sig: Take 1 tablet (200 mg total) by mouth 2 (two) times daily.    Dispense:  60  tablet    Refill:  2    Face-to-face time spent with patient was 30 minutes. >50% of time was spent in counseling and coordination of care.  Follow-Up Instructions: Return for Systemic lupus erythematosus, Osteoarthritis.   Ofilia Neas, PA-C  I examined and evaluated the patient with Julia Sams PA.  Patient had no synovitis on my examination today.  The plan of care was discussed as noted above.  Bo Merino, MD  Note - This record has been created using Editor, commissioning.  Chart creation errors have been sought, but may not always  have been located. Such creation errors do not reflect on  the standard of medical care.

## 2017-06-01 ENCOUNTER — Encounter: Payer: Self-pay | Admitting: Rheumatology

## 2017-06-01 ENCOUNTER — Ambulatory Visit: Payer: 59 | Admitting: Rheumatology

## 2017-06-01 VITALS — BP 130/88 | HR 74 | Resp 18 | Ht 69.0 in | Wt 345.0 lb

## 2017-06-01 DIAGNOSIS — M3219 Other organ or system involvement in systemic lupus erythematosus: Secondary | ICD-10-CM | POA: Diagnosis not present

## 2017-06-01 DIAGNOSIS — R5383 Other fatigue: Secondary | ICD-10-CM | POA: Diagnosis not present

## 2017-06-01 DIAGNOSIS — Z8639 Personal history of other endocrine, nutritional and metabolic disease: Secondary | ICD-10-CM

## 2017-06-01 DIAGNOSIS — M170 Bilateral primary osteoarthritis of knee: Secondary | ICD-10-CM

## 2017-06-01 DIAGNOSIS — Z96642 Presence of left artificial hip joint: Secondary | ICD-10-CM

## 2017-06-01 DIAGNOSIS — Z79899 Other long term (current) drug therapy: Secondary | ICD-10-CM | POA: Diagnosis not present

## 2017-06-01 DIAGNOSIS — M1611 Unilateral primary osteoarthritis, right hip: Secondary | ICD-10-CM | POA: Diagnosis not present

## 2017-06-01 DIAGNOSIS — E559 Vitamin D deficiency, unspecified: Secondary | ICD-10-CM | POA: Diagnosis not present

## 2017-06-01 MED ORDER — HYDROXYCHLOROQUINE SULFATE 200 MG PO TABS: 200.0000 mg | ORAL_TABLET | Freq: Two times a day (BID) | ORAL | 2 refills | Status: DC

## 2017-06-03 LAB — ANA: Anti Nuclear Antibody(ANA): NEGATIVE

## 2017-06-03 LAB — URINALYSIS, ROUTINE W REFLEX MICROSCOPIC
Bacteria, UA: NONE SEEN /HPF
Bilirubin Urine: NEGATIVE
GLUCOSE, UA: NEGATIVE
HYALINE CAST: NONE SEEN /LPF
Ketones, ur: NEGATIVE
Leukocytes, UA: NEGATIVE
Nitrite: NEGATIVE
PROTEIN: NEGATIVE
RBC / HPF: NONE SEEN /HPF (ref 0–2)
Specific Gravity, Urine: 1.028 (ref 1.001–1.03)
Squamous Epithelial / LPF: NONE SEEN /HPF (ref <=5)
WBC UA: NONE SEEN /HPF (ref 0–5)
pH: 5.5 (ref 5.0–8.0)

## 2017-06-03 LAB — VITAMIN D 25 HYDROXY (VIT D DEFICIENCY, FRACTURES): Vit D, 25-Hydroxy: 36 ng/mL (ref 30–100)

## 2017-06-03 LAB — ANTI-DNA ANTIBODY, DOUBLE-STRANDED: ds DNA Ab: 17 IU/mL — ABNORMAL HIGH

## 2017-06-03 LAB — SEDIMENTATION RATE: SED RATE: 38 mm/h — AB (ref 0–20)

## 2017-06-03 LAB — C3 AND C4
C3 Complement: 166 mg/dL (ref 83–193)
C4 Complement: 53 mg/dL (ref 15–57)

## 2017-06-03 NOTE — Progress Notes (Signed)
Trace hgb revealed on UA. Please ask if she is on her menstrual cycle. Sed rate and dsDNA stable. Complements are WNL. ANA negative.  Vitamin D is 36. Please advise her to take vitamin D 2,000 units by mouth daily.

## 2017-06-04 ENCOUNTER — Telehealth: Payer: Self-pay | Admitting: Rheumatology

## 2017-06-04 NOTE — Telephone Encounter (Signed)
Patient called stating she was returning your call.   °

## 2017-06-04 NOTE — Telephone Encounter (Signed)
Patient states she spoke to you at her appointment regarding FMLA. She states that you told her to wait until her lab results came back. Patient would like to know what she should do and if you would be willing to fill them out. Please advise.

## 2017-06-05 ENCOUNTER — Ambulatory Visit: Payer: 59 | Admitting: Family Medicine

## 2017-06-05 ENCOUNTER — Encounter: Payer: Self-pay | Admitting: Family Medicine

## 2017-06-05 VITALS — BP 128/86 | HR 82 | Temp 98.2°F | Resp 16

## 2017-06-05 DIAGNOSIS — R319 Hematuria, unspecified: Secondary | ICD-10-CM | POA: Diagnosis not present

## 2017-06-05 LAB — POCT URINALYSIS DIPSTICK
Bilirubin, UA: NEGATIVE
GLUCOSE UA: NEGATIVE
Ketones, UA: NEGATIVE
LEUKOCYTES UA: NEGATIVE
NITRITE UA: NEGATIVE
PH UA: 6 (ref 5.0–8.0)
PROTEIN UA: NEGATIVE
Spec Grav, UA: >=1.03 — AB (ref 1.010–1.025)
Urobilinogen, UA: 0.2 E.U./dL

## 2017-06-05 NOTE — Patient Instructions (Signed)
Please schedule an appointment with Dr. Jarrett Soho.   Hematuria, Adult Hematuria is blood in your urine. It can be caused by a bladder infection, kidney infection, prostate infection, kidney stone, or cancer of your urinary tract. Infections can usually be treated with medicine, and a kidney stone usually will pass through your urine. If neither of these is the cause of your hematuria, further workup to find out the reason may be needed. It is very important that you tell your health care provider about any blood you see in your urine, even if the blood stops without treatment or happens without causing pain. Blood in your urine that happens and then stops and then happens again can be a symptom of a very serious condition. Also, pain is not a symptom in the initial stages of many urinary cancers. Follow these instructions at home:  Drink lots of fluid, 3-4 quarts a day. If you have been diagnosed with an infection, cranberry juice is especially recommended, in addition to large amounts of water.  Avoid caffeine, tea, and carbonated beverages because they tend to irritate the bladder.  Avoid alcohol because it may irritate the prostate.  Take all medicines as directed by your health care provider.  If you were prescribed an antibiotic medicine, finish it all even if you start to feel better.  If you have been diagnosed with a kidney stone, follow your health care provider's instructions regarding straining your urine to catch the stone.  Empty your bladder often. Avoid holding urine for long periods of time.  After a bowel movement, women should cleanse front to back. Use each tissue only once.  Empty your bladder before and after sexual intercourse if you are a female. Contact a health care provider if:  You develop back pain.  You have a fever.  You have a feeling of sickness in your stomach (nausea) or vomiting.  Your symptoms are not better in 3 days. Return sooner if you are getting  worse. Get help right away if:  You develop severe vomiting and are unable to keep the medicine down.  You develop severe back or abdominal pain despite taking your medicines.  You begin passing a large amount of blood or clots in your urine.  You feel extremely weak or faint, or you pass out. This information is not intended to replace advice given to you by your health care provider. Make sure you discuss any questions you have with your health care provider. Document Released: 01/12/2005 Document Revised: 06/20/2015 Document Reviewed: 09/12/2012 Elsevier Interactive Patient Education  2017 [employer].

## 2017-06-05 NOTE — Progress Notes (Signed)
Subjective  CC:  Chief Complaint  Patient presents with  . Hematuria    since Tuesday, discomfort when urinating. minor lower back pain.     HPI: Julia Monroe is a 47 y.o. female who presents to the office today to address the problems listed above in the chief complaint.  Was seen by Dr. Estanislado Pandy earlier this week and found to have tr blood on urine dipstick. Was asked to f/u with pcp. Made appt with acute care clinic.   Feeling well w/o dysuria, gross hematuria, vaginal discharge or f/c/s. S/o hysterectomy. Not on blood thinners.  I reviewed the patients updated PMH, FH, and SocHx.    Patient Active Problem List   Diagnosis Date Noted  . Class 3 severe obesity due to excess calories without serious comorbidity with body mass index (BMI) of 50.0 to 59.9 in adult ([place]) 01/27/2017  . History of total hip replacement, left 10/27/2016  . Hyperglycemia 08/25/2016  . Leukocytosis 08/13/2016  . Status post laparoscopic hysterectomy 07/06/2016  . Primary osteoarthritis of both knees 04/29/2016  . ANA positive 04/29/2016  . Primary osteoarthritis of right hip 07/10/2011   Current Meds  Medication Sig  . acetaminophen (TYLENOL) 500 MG tablet Take 500 mg by mouth as needed.  Marland Kitchen CAMBIA 50 MG PACK as needed.  . Cholecalciferol (VITAMIN D3) 1000 units CAPS Take 3,000 Units by mouth daily.  . DiphenhydrAMINE HCl (ALKA-SELTZER PLUS ALLERGY PO) Take by mouth as needed.  . hydroxychloroquine (PLAQUENIL) 200 MG tablet Take 1 tablet (200 mg total) by mouth 2 (two) times daily.  . IRON PO Take 10 mLs by mouth daily. LIQUID IRON (Ferric Glycinate) 18 mg per 10 ml  . methocarbamol (ROBAXIN) 500 MG tablet Take 500 mg by mouth every 8 (eight) hours as needed for muscle spasms.   . ondansetron (ZOFRAN ODT) 4 MG disintegrating tablet Take 1 tablet (4 mg total) by mouth every 8 (eight) hours as needed for nausea or vomiting.  . Tetrahydrozoline HCl (VISINE OP) Place 1 drop into both eyes 3  (three) times daily as needed (for irritated/dry eyes.).  Marland Kitchen traMADol (ULTRAM) 50 MG tablet Take 2 tablets (100 mg total) by mouth every 6 (six) hours as needed for moderate pain.  . TURMERIC PO Take by mouth daily.    Allergies: Patient is allergic to monosodium glutamate; lactose intolerance (gi); caffeine; hydrocodone; and oxycodone. Family History: Patient family history includes Arthritis in her maternal grandmother; Hypertension in her brother; Osteoarthritis in her brother, maternal grandmother, and mother. Social History:  Patient  reports that she has never smoked. She has never used smokeless tobacco. She reports that she does not drink alcohol or use drugs.  Review of Systems: Constitutional: Negative for fever malaise or anorexia Cardiovascular: negative for chest pain Respiratory: negative for SOB or persistent cough Gastrointestinal: negative for abdominal pain  Objective  Vitals: BP 128/86   Pulse 82   Temp 98.2 F (36.8 C) (Oral)   Resp 16   LMP 04/29/2016   SpO2 98%  General: no acute distress , A&Ox3 No CVAT, benign abdomen Skin:  Warm, no rashes Office Visit on 06/05/2017  Component Date Value Ref Range Status  . Color, UA 06/05/2017 yellow   Final  . Clarity, UA 06/05/2017 clear   Final  . Glucose, UA 06/05/2017 neg   Final  . Bilirubin, UA 06/05/2017 neg   Final  . Ketones, UA 06/05/2017 neg   Final  . Spec Grav, UA 06/05/2017 >=1.030* 1.010 -  1.025 Final  . Blood, UA 06/05/2017 10 Ery/uL   Final  . pH, UA 06/05/2017 6.0  5.0 - 8.0 Final  . Protein, UA 06/05/2017 neg   Final  . Urobilinogen, UA 06/05/2017 0.2  0.2 or 1.0 E.U./dL Final  . Nitrite, UA 06/05/2017 neg   Final  . Leukocytes, UA 06/05/2017 Negative  Negative Final    Assessment  1. Hematuria, unspecified type      Plan   Microscopic hematuria:  Possible hematuria but need microscopy to confirm. Discussed with patient. Recommend f/u in 1-4 weeks with PCP for UA with micro; further eval  pending results. No signs or sxs of infection at this point.   Follow up: prn    Commons side effects, risks, benefits, and alternatives for medications and treatment plan prescribed today were discussed, and the patient expressed understanding of the given instructions. Patient is instructed to call or message via MyChart if he/she has any questions or concerns regarding our treatment plan. No barriers to understanding were identified. We discussed Red Flag symptoms and signs in detail. Patient expressed understanding regarding what to do in case of urgent or emergency type symptoms.   Medication list was reconciled, printed and provided to the patient in AVS. Patient instructions and summary information was reviewed with the patient as documented in the AVS. This note was prepared with assistance of Dragon voice recognition software. Occasional wrong-word or sound-a-like substitutions may have occurred due to the inherent limitations of voice recognition software  Orders Placed This Encounter  Procedures  . POCT urinalysis dipstick   No orders of the defined types were placed in this encounter.

## 2017-06-07 NOTE — Telephone Encounter (Signed)
Patient advised and will bring the paper work over and advised that it will be sent to [employer] and they will contact her when it is ready for pick up.

## 2017-06-07 NOTE — Telephone Encounter (Signed)
Ok to give FMLA 2 days a month.

## 2017-08-26 NOTE — Progress Notes (Signed)
Office Visit Note  Patient: Julia Monroe             Date of Birth: 1970/04/24           MRN: [id number]             PCP: Lucretia Kern, DO Referring: Lucretia Kern, DO Visit Date: 08/27/2017 Occupation: '@GUAROCC'$ @  Subjective:  Left foot pain   History of Present Illness: Julia Monroe is a 47 y.o. female with history of systemic lupus erythematosus and osteoarthritis.  She states she has been having pain and discomfort in her left heel for about 6 weeks now.  She states very quite painful in the morning.  She has difficulty walking on the foot.  She has tried tramadol and Tylenol.  She has tried heat topical agents without much relief.  None of the other joints are painful.  She denies any history of joint swelling.  There is no history of rash, oral ulcers nasal ulcers or rainouts phenomenon.  Activities of Daily Living:  Patient reports morning stiffness for 8 hours.   Patient Reports nocturnal pain.  Difficulty dressing/grooming: Denies Difficulty climbing stairs: Reports Difficulty getting out of chair: Reports Difficulty using hands for taps, buttons, cutlery, and/or writing: Denies  Review of Systems  Constitutional: Positive for fatigue. Negative for night sweats, weight gain and weight loss.  HENT: Negative for mouth sores, trouble swallowing, trouble swallowing, mouth dryness and nose dryness.   Eyes: Negative for pain, redness, visual disturbance and dryness.  Respiratory: Negative for cough, shortness of breath and difficulty breathing.   Cardiovascular: Negative for chest pain, palpitations, hypertension, irregular heartbeat and swelling in legs/feet.  Gastrointestinal: Negative for blood in stool, constipation and diarrhea.  Endocrine: Negative for increased urination.  Genitourinary: Negative for vaginal dryness.  Musculoskeletal: Positive for arthralgias, joint pain, myalgias, morning stiffness and myalgias. Negative for joint swelling, muscle  weakness and muscle tenderness.  Skin: Negative for color change, rash, hair loss, skin tightness, ulcers and sensitivity to sunlight.  Allergic/Immunologic: Negative for susceptible to infections.  Neurological: Negative for dizziness, numbness, headaches, memory loss, night sweats and weakness.  Hematological: Negative for swollen glands.  Psychiatric/Behavioral: Positive for sleep disturbance. Negative for depressed mood. The patient is not nervous/anxious.     PMFS History:  Patient Active Problem List   Diagnosis Date Noted  . History of migraine 08/27/2017  . Class 3 severe obesity due to excess calories without serious comorbidity with body mass index (BMI) of 50.0 to 59.9 in adult ([facility]) 01/27/2017  . History of total hip replacement, left 10/27/2016  . Hyperglycemia 08/25/2016  . Leukocytosis 08/13/2016  . Status post laparoscopic hysterectomy 07/06/2016  . Primary osteoarthritis of both knees 04/29/2016  . ANA positive 04/29/2016  . Primary osteoarthritis of right hip 07/10/2011    Past Medical History:  Diagnosis Date  . Arthritis    osteoarthritis  . Complication of anesthesia    bottom of feet itching  . Elevated hemoglobin A1c 2018   6.1  . Headache(784.0)    hx migraines    Family History  Problem Relation Age of Onset  . Osteoarthritis Mother   . Osteoarthritis Brother   . Hypertension Brother   . Osteoarthritis Maternal Grandmother   . Arthritis Maternal Grandmother    Past Surgical History:  Procedure Laterality Date  . ABDOMINAL HYSTERECTOMY    . CESAREAN SECTION  2000  . CYSTOSCOPY N/A 07/06/2016   Procedure: CYSTOSCOPY;  Surgeon: Yisroel Ramming,  Ever[place] All, MD;  Location: [facility];  Service: Gynecology;  Laterality: N/A;  . JOINT REPLACEMENT  L   Left hip replacement  . OOPHORECTOMY  2007   Rt.oophorectomy   . OVARIAN CYST SURGERY  ? and 10   rt removed,cyst off lft  . TOTAL HIP ARTHROPLASTY  07/06/2011   Procedure: TOTAL HIP ARTHROPLASTY;  Surgeon:  Kerin Salen, MD;  Location: [facility];  Service: Orthopedics;  Laterality: Left;  DEPUY/PINNACLE  . TOTAL LAPAROSCOPIC HYSTERECTOMY WITH SALPINGECTOMY Left 07/06/2016   Procedure: HYSTERECTOMY TOTAL LAPAROSCOPIC WITH Left SALPINGECTOMY, Suture Left Side Wall Laceration;  Surgeon: Nunzio Cobbs, MD;  Location: [facility];  Service: Gynecology;  Laterality: Left;   Social History   Social History Narrative   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[employer]      Home Situation: 33 yo daughter in 2018      Spiritual Beliefs:      Lifestyle: starting healthy low sugar diet 08/2016/[facility]    Objective: Vital Signs: BP 121/76 (BP Location: Left Arm, Patient Position: Sitting, Cuff Size: Normal)   Pulse 73   Ht '5\' 9"'$  (1.753 m)   LMP 04/29/2016   BMI 50.95 kg/m    Physical Exam  Constitutional: She is oriented to person, place, and time. She appears well-developed and well-nourished.  HENT:  Head: Normocephalic and atraumatic.  Eyes: Conjunctivae and EOM are normal.  Neck: Normal range of motion.  Cardiovascular: Normal rate, regular rhythm, normal heart sounds and intact distal pulses.  Pulmonary/Chest: Effort normal and breath sounds normal.  Abdominal: Soft. Bowel sounds are normal.  Lymphadenopathy:    She has no cervical adenopathy.  Neurological: She is alert and oriented to person, place, and time.  Skin: Skin is warm and dry. Capillary refill takes less than 2 seconds.  Psychiatric: She has a normal mood and affect. Her behavior is normal.  Nursing note and vitals reviewed.    Musculoskeletal Exam: C-spine thoracic lumbar spine good range of motion.  Shoulder joints elbow joints wrist joint MCPs PIPs DIPs been good range of motion with no synovitis.  Hip joints knee joints ankles MTPs PIPs been good range of motion with no synovitis.  She has tenderness on palpation over posterior aspect of her calcaneum.  There was no evidence of plantar fasciitis or Achilles  tendinitis.  CDAI Exam: No CDAI exam completed.   Investigation: No additional findings.  Imaging: Xr Ankle 2 Views Left  Result Date: 08/27/2017 Mild tibiotalar joint space narrowing was noted.  Inferior and posterior calcaneal spur was noted.  No fracture was noted.   Recent Labs: Lab Results  Component Value Date   WBC 8.7 01/27/2017   HGB 12.4 01/27/2017   PLT 329 01/27/2017   NA 138 01/27/2017   K 4.3 01/27/2017   CL 104 01/27/2017   CO2 26 01/27/2017   GLUCOSE 92 01/27/2017   BUN 11 01/27/2017   CREATININE 0.70 01/27/2017   BILITOT 0.3 01/27/2017   ALKPHOS 62 04/29/2016   AST 11 01/27/2017   ALT 7 01/27/2017   PROT 6.9 01/27/2017   ALBUMIN 3.8 04/29/2016   CALCIUM 9.0 01/27/2017   GFRAA 122 01/27/2017    Speciality Comments: PLQ eye exam: 11/24/2016 Normal. Dr. Thalia Bloodgood. Follow up in 1 year.  Procedures:  No procedures performed Allergies: Monosodium glutamate; Lactose intolerance (gi); Caffeine; Hydrocodone; and Oxycodone   Assessment / Plan:     Visit Diagnoses: Other systemic lupus erythematosus with other  organ involvement ([place]) - History of positive ANA, DS DNA, elevated ESR, arthralgias, rash: She gives a history of mouth ulcerations, worsening fatigue, and joint pain.  Patient states her lupus is well controlled without any increased joint swelling there is no history of oral ulcers fatigue or rainouts recently.  She has been taking her Plaquenil on regular basis.  High risk medication use - PLQ 200 mg po bideye exam: 11/24/2016.  Her most recent labs which were CBC and CMP were normal per patient.  She will bring [place] a copy.  Acute left ankle pain-she has been having pain in her left ankle for the last 6 weeks.  She is having difficulty walking.  I have given her a prescription for Voltaren gel that can be used topically.  She cannot tolerate NSAIDs orally.  The x-ray of the ankle joint was unremarkable.  I offered MRI of the ankle joint but she  declined.  We will see how she responds to the topical anti-inflammatories.  If she has persistent problems she will contact me to get MRI.  I also offered evaluation by foot specialist.  Primary osteoarthritis of both knees-she has chronic mild discomfort.  Primary osteoarthritis of right hip-chronic pain.  History of total hip replacement, left-doing quite well.  Other fatigue-fatigue is better.  Vitamin D deficiency-she is on vitamin D supplement.  History of obesity-weight loss diet and exercise was discussed.  Although she is having difficulty walking currently and it is been difficult for her.  History of migraine -she has occasional migraines.  Orders: Orders Placed This Encounter  Procedures  . XR Ankle 2 Views Left   Meds ordered this encounter  Medications  . diclofenac sodium (VOLTAREN) 1 % GEL    Sig: Apply 3 gm to 3 large joints up to 3 times a day.Dispense 3 tubes with 3 refills.    Dispense:  3 Tube    Refill:  0    Face-to-face time spent with patient was 30 minutes. Greater than 50% of time was spent in counseling and coordination of care.  Follow-Up Instructions: Return in about 5 months (around 01/27/2018) for Systemic lupus, Osteoarthritis.   Bo Merino, MD  Note - This record has been created using Editor, commissioning.  Chart creation errors have been sought, but may not always  have been located. Such creation errors do not reflect on  the standard of medical care.

## 2017-08-27 ENCOUNTER — Encounter: Payer: Self-pay | Admitting: Rheumatology

## 2017-08-27 ENCOUNTER — Ambulatory Visit (INDEPENDENT_AMBULATORY_CARE_PROVIDER_SITE_OTHER): Payer: 59

## 2017-08-27 ENCOUNTER — Ambulatory Visit: Payer: 59 | Admitting: Rheumatology

## 2017-08-27 VITALS — BP 121/76 | HR 73 | Ht 69.0 in

## 2017-08-27 DIAGNOSIS — Z79899 Other long term (current) drug therapy: Secondary | ICD-10-CM | POA: Diagnosis not present

## 2017-08-27 DIAGNOSIS — M3219 Other organ or system involvement in systemic lupus erythematosus: Secondary | ICD-10-CM

## 2017-08-27 DIAGNOSIS — E559 Vitamin D deficiency, unspecified: Secondary | ICD-10-CM | POA: Diagnosis not present

## 2017-08-27 DIAGNOSIS — Z8669 Personal history of other diseases of the nervous system and sense organs: Secondary | ICD-10-CM

## 2017-08-27 DIAGNOSIS — M170 Bilateral primary osteoarthritis of knee: Secondary | ICD-10-CM

## 2017-08-27 DIAGNOSIS — Z8639 Personal history of other endocrine, nutritional and metabolic disease: Secondary | ICD-10-CM

## 2017-08-27 DIAGNOSIS — M25572 Pain in left ankle and joints of left foot: Secondary | ICD-10-CM

## 2017-08-27 DIAGNOSIS — R5383 Other fatigue: Secondary | ICD-10-CM

## 2017-08-27 DIAGNOSIS — Z96642 Presence of left artificial hip joint: Secondary | ICD-10-CM

## 2017-08-27 DIAGNOSIS — M1611 Unilateral primary osteoarthritis, right hip: Secondary | ICD-10-CM

## 2017-08-27 MED ORDER — DICLOFENAC SODIUM 1 % TD GEL: TRANSDERMAL | 0 refills | Status: DC

## 2017-09-09 ENCOUNTER — Ambulatory Visit: Payer: 59 | Admitting: Family Medicine

## 2017-10-07 DIAGNOSIS — M1712 Unilateral primary osteoarthritis, left knee: Secondary | ICD-10-CM | POA: Diagnosis not present

## 2017-10-07 DIAGNOSIS — Z96642 Presence of left artificial hip joint: Secondary | ICD-10-CM | POA: Diagnosis not present

## 2017-10-07 DIAGNOSIS — M1611 Unilateral primary osteoarthritis, right hip: Secondary | ICD-10-CM | POA: Diagnosis not present

## 2017-10-07 DIAGNOSIS — Z09 Encounter for follow-up examination after completed treatment for conditions other than malignant neoplasm: Secondary | ICD-10-CM | POA: Diagnosis not present

## 2017-11-05 ENCOUNTER — Ambulatory Visit: Payer: 59 | Admitting: Physician Assistant

## 2017-11-08 ENCOUNTER — Other Ambulatory Visit: Payer: Self-pay | Admitting: Obstetrics and Gynecology

## 2017-11-08 DIAGNOSIS — Z1231 Encounter for screening mammogram for malignant neoplasm of breast: Secondary | ICD-10-CM

## 2017-11-11 NOTE — Progress Notes (Signed)
47 y.o. G52P1001 Single African American female here for annual exam.    Planning a hip replacement but needs to loose weight first. Having difficulty with weight gain.   Saw Dr. Jana Hakim and told her WBC runs a little high due to inflammation.  PCP: Colin Benton, DO  Patient's last menstrual period was 04/29/2016.           Sexually active: Yes.   female The current method of family planning is status post hysterectomy.    Exercising: No.   Smoker:  no  Health Maintenance: Pap: 08-24-15 Neg:Neg HR HPV.  Final cervical pathology specimen at hysterectomy was normal. History of abnormal Pap:  no MMG: 10-21-15 Neg/Density A/BiRads1--APPT. 12-17-17  Colonoscopy:  n/a BMD:   n/a  Result  n/a TDaP: up to date through work Gardasil:   no HBZ:JIRCV ago Neg Hep C:Years ago Neg Screening Labs:  Hb today: PCP/Rheumatologist   reports that she has never smoked. She has never used smokeless tobacco. She reports that she does not drink alcohol or use drugs.  Past Medical History:  Diagnosis Date  . Arthritis    osteoarthritis  . Complication of anesthesia    bottom of feet itching  . Elevated hemoglobin A1c 2018   6.1  . Headache(784.0)    hx migraines  . Lupus ([facility]) 2018   Dr.Deveshwar    Past Surgical History:  Procedure Laterality Date  . ABDOMINAL HYSTERECTOMY    . CESAREAN SECTION  2000  . CYSTOSCOPY N/A 07/06/2016   Procedure: CYSTOSCOPY;  Surgeon: Nunzio Cobbs, MD;  Location: [facility];  Service: Gynecology;  Laterality: N/A;  . JOINT REPLACEMENT  L   Left hip replacement  . OOPHORECTOMY  2007   Rt.oophorectomy   . OVARIAN CYST SURGERY  ? and 10   rt removed,cyst off lft  . TOTAL HIP ARTHROPLASTY  07/06/2011   Procedure: TOTAL HIP ARTHROPLASTY;  Surgeon: Kerin Salen, MD;  Location: [facility];  Service: Orthopedics;  Laterality: Left;  DEPUY/PINNACLE  . TOTAL LAPAROSCOPIC HYSTERECTOMY WITH SALPINGECTOMY Left 07/06/2016   Procedure: HYSTERECTOMY TOTAL LAPAROSCOPIC  WITH Left SALPINGECTOMY, Suture Left Side Wall Laceration;  Surgeon: Nunzio Cobbs, MD;  Location: [facility];  Service: Gynecology;  Laterality: Left;    Current Outpatient Medications  Medication Sig Dispense Refill  . acetaminophen (TYLENOL) 500 MG tablet Take 500 mg by mouth as needed.    Marland Kitchen CAMBIA 50 MG PACK as needed.  0  . Cholecalciferol (VITAMIN D3) 1000 units CAPS Take 3,000 Units by mouth daily.    . diclofenac sodium (VOLTAREN) 1 % GEL Apply 3 gm to 3 large joints up to 3 times a day.Dispense 3 tubes with 3 refills. 3 Tube 0  . DiphenhydrAMINE HCl (ALKA-SELTZER PLUS ALLERGY PO) Take by mouth as needed.    . hydroxychloroquine (PLAQUENIL) 200 MG tablet Take 1 tablet (200 mg total) by mouth 2 (two) times daily. 60 tablet 2  . IRON PO Take 10 mLs by mouth daily. LIQUID IRON (Ferric Glycinate) 18 mg per 10 ml    . methocarbamol (ROBAXIN) 500 MG tablet Take 500 mg by mouth every 8 (eight) hours as needed for muscle spasms.   1  . ondansetron (ZOFRAN ODT) 4 MG disintegrating tablet Take 1 tablet (4 mg total) by mouth every 8 (eight) hours as needed for nausea or vomiting. 20 tablet 0  . Tetrahydrozoline HCl (VISINE OP) Place 1 drop into both eyes 3 (three) times daily as  needed (for irritated/dry eyes.).    Marland Kitchen traMADol (ULTRAM) 50 MG tablet Take 2 tablets (100 mg total) by mouth every 6 (six) hours as needed for moderate pain. 30 tablet 0  . TURMERIC PO Take by mouth daily.     No current facility-administered medications for this visit.     Family History  Problem Relation Age of Onset  . Osteoarthritis Mother   . Osteoarthritis Brother   . Hypertension Brother   . Osteoarthritis Maternal Grandmother   . Arthritis Maternal Grandmother     Review of Systems  All other systems reviewed and are negative.   Exam:   BP 130/78 (BP Location: Right Arm, Patient Position: Sitting, Cuff Size: Large)   Pulse 80   Resp 20   Ht 5\' 9"  (1.753 m)   Wt (!) 356 lb (161.5 kg)   LMP  04/29/2016   BMI 52.57 kg/m     General appearance: alert, cooperative and appears stated age Head: Normocephalic, without obvious abnormality, atraumatic Neck: no adenopathy, supple, symmetrical, trachea midline and thyroid normal to inspection and palpation Lungs: clear to auscultation bilaterally Breasts: normal appearance, no masses or tenderness, No nipple retraction or dimpling, No nipple discharge or bleeding, No axillary or supraclavicular adenopathy Heart: regular rate and rhythm Abdomen: soft, non-tender; no masses, no organomegaly Extremities: extremities normal, atraumatic, no cyanosis or edema Skin: Skin color, texture, turgor normal. No rashes or lesions Lymph nodes: Cervical, supraclavicular, and axillary nodes normal. No abnormal inguinal nodes palpated Neurologic: Grossly normal  Pelvic: External genitalia:  no lesions              Urethra:  normal appearing urethra with no masses, tenderness or lesions              Bartholins and Skenes: normal                 Vagina: normal appearing vagina with normal color and discharge, no lesions              Cervix:  absent              Pap taken: No. Bimanual Exam:  Uterus:  Absent.              Adnexa: no mass, fullness, tenderness              Rectal exam: Yes.  .  Confirms.              Anus:  normal sphincter tone, no lesions  Chaperone was present for exam.  Assessment:   Well woman visit with normal exam. Status post RSO.  Status post total laparoscopic hysterectomy with left salpingectomy/cystoscopy.  Arthritic pain. Improved with Plaquenil.  Lupus. AODM.  Diet controlled. Obesity.   Plan: Mammogram screening. Recommended self breast awareness. Pap and HR HPV as above. Guidelines for Calcium, Vitamin D, regular exercise program including cardiovascular and weight bearing exercise. We talked at length today about weight loss and the benefits of weight loss surgery.  She will research this further.   IFOB. Follow up annually and prn.    After visit summary provided.

## 2017-11-12 ENCOUNTER — Ambulatory Visit: Payer: 59 | Admitting: Obstetrics and Gynecology

## 2017-11-12 ENCOUNTER — Encounter: Payer: Self-pay | Admitting: Obstetrics and Gynecology

## 2017-11-12 ENCOUNTER — Other Ambulatory Visit: Payer: Self-pay

## 2017-11-12 VITALS — BP 130/78 | HR 80 | Resp 20 | Ht 69.0 in | Wt 356.0 lb

## 2017-11-12 DIAGNOSIS — Z1211 Encounter for screening for malignant neoplasm of colon: Secondary | ICD-10-CM | POA: Diagnosis not present

## 2017-11-12 DIAGNOSIS — Z01419 Encounter for gynecological examination (general) (routine) without abnormal findings: Secondary | ICD-10-CM | POA: Diagnosis not present

## 2017-11-12 NOTE — Patient Instructions (Signed)
EXERCISE AND DIET:  We recommended that you start or continue a regular exercise program for good health. Regular exercise means any activity that makes your heart beat faster and makes you sweat.  We recommend exercising at least 30 minutes per day at least 3 days a week, preferably 4 or 5.  We also recommend a diet low in fat and sugar.  Inactivity, poor dietary choices and obesity can cause diabetes, heart attack, stroke, and kidney damage, among others.    ALCOHOL AND SMOKING:  Women should limit their alcohol intake to no more than 7 drinks/beers/glasses of wine (combined, not each!) per week. Moderation of alcohol intake to this level decreases your risk of breast cancer and liver damage. And of course, no recreational drugs are part of a healthy lifestyle.  And absolutely no smoking or even second hand smoke. Most people know smoking can cause heart and lung diseases, but did you know it also contributes to weakening of your bones? Aging of your skin?  Yellowing of your teeth and nails?  CALCIUM AND VITAMIN D:  Adequate intake of calcium and Vitamin D are recommended.  The recommendations for exact amounts of these supplements seem to change often, but generally speaking 600 mg of calcium (either carbonate or citrate) and 800 units of Vitamin D per day seems prudent. Certain women may benefit from higher intake of Vitamin D.  If you are among these women, your doctor will have told you during your visit.    PAP SMEARS:  Pap smears, to check for cervical cancer or precancers,  have traditionally been done yearly, although recent scientific advances have shown that most women can have pap smears less often.  However, every woman still should have a physical exam from her gynecologist every year. It will include a breast check, inspection of the vulva and vagina to check for abnormal growths or skin changes, a visual exam of the cervix, and then an exam to evaluate the size and shape of the uterus and  ovaries.  And after 45 years of age, a rectal exam is indicated to check for rectal cancers. We will also provide age appropriate advice regarding health maintenance, like when you should have certain vaccines, screening for sexually transmitted diseases, bone density testing, colonoscopy, mammograms, etc.   MAMMOGRAMS:  All women over 40 years old should have a yearly mammogram. Many facilities now offer a "3D" mammogram, which may cost around $50 extra out of pocket. If possible,  we recommend you accept the option to have the 3D mammogram performed.  It both reduces the number of women who will be called back for extra views which then turn out to be normal, and it is better than the routine mammogram at detecting truly abnormal areas.    COLONOSCOPY:  Colonoscopy to screen for colon cancer is recommended for all women at age 50.  We know, you hate the idea of the prep.  We agree, BUT, having colon cancer and not knowing it is worse!!  Colon cancer so often starts as a polyp that can be seen and removed at colonscopy, which can quite literally save your life!  And if your first colonoscopy is normal and you have no family history of colon cancer, most women don't have to have it again for 10 years.  Once every ten years, you can do something that may end up saving your life, right?  We will be happy to help you get it scheduled when you are ready.    Be sure to check your insurance coverage so you understand how much it will cost.  It may be covered as a preventative service at no cost, but you should check your particular policy.       Bariatric Surgery Information Bariatric surgery, also called weight loss surgery, is a procedure that helps you lose weight. You may consider or your health care provider may suggest bariatric surgery if:  You are severely obese and have been unable to lose weight through diet and exercise.  You have health problems related to obesity, such as: ? Type 2  diabetes. ? Heart disease. ? Lung disease.  How does bariatric surgery help me lose weight? Bariatric surgery helps you lose weight by decreasing how much food your body absorbs. This is done by closing off part of your stomach to make it smaller. This restricts the amount of food your stomach can hold. Bariatric surgery can also change your body's regular digestive process, so that food bypasses the parts of your body that absorb calories and nutrients. If you decide to have bariatric surgery, it is important to continue to eat a healthy diet and exercise regularly after the surgery. What are the different kinds of bariatric surgery? There are two kinds of bariatric surgeries:  Restrictive surgeries make your stomach smaller. They do not change your digestive process. The smaller the size of your new stomach, the less food you can eat. There are different types of restrictive surgeries.  Malabsorptive surgeries both make your stomach smaller and alter your digestive process so that your body processes less calories and nutrients. These are the most common kind of bariatric surgery. There are different types of malabsorptive surgeries.  What are the different types of restrictive surgery? Adjustable Gastric Banding In this procedure, an inflatable band is placed around your stomach near the upper end. This makes the passageway for food into the rest of your stomach much smaller. The band can be adjusted, making it tighter or looser, by filling it with salt solution. Your surgeon can adjust the band based on how are you feeling and how much weight you are losing. The band can be removed in the future. Vertical Banded Gastroplasty In this procedure, staples are used to separate your stomach into two parts, a small upper pouch and a bigger lower pouch. This decreases how much food you can eat. Sleeve Gastrectomy In this procedure, your stomach is made smaller. This is done by surgically removing a  large part of your stomach. When your stomach is smaller, you feel full more quickly and reduce how much you eat. What are the different types of malabsorptive surgery? Roux-en-Y Gastric Bypass (RGB) This is the most common weight loss surgery. In this procedure, a small stomach pouch is created in the upper part of your stomach. Next, this small stomach pouch is attached directly to the middle part of your small intestine. The farther down your small intestine the new connection is made, the fewer calories and nutrients you will absorb. Biliopancreatic Diversion with Duodenal Switch (BPD/DS) This is a multi-step procedure. In this procedure, a large part of your stomach is removed, making your stomach smaller. Next, this smaller stomach is attached to the lower part of your small intestine. Like the RGB surgery, you absorb fewer calories and nutrients the farther down your small intestine the attachment is made. What are the risks of bariatric surgery? As with any surgical procedure, each type of bariatric surgery has its own risks. These risks  also depend on your age, your overall health, and any other medical conditions you may have. When deciding on bariatric surgery, it is very important to:  Talk to your health care provider and choose the surgery that is best for you.  Ask your health care provider about specific risks for the surgery you choose.  Where to find more information:  American Society for Metabolic & Bariatric Surgery: www.asmbs.org  Weight-control Information Network (WIN): win.AmenCredit.is This information is not intended to replace advice given to you by your health care provider. Make sure you discuss any questions you have with your health care provider. Document Released: 01/12/2005 Document Revised: 06/20/2015 Document Reviewed: 07/13/2012 Elsevier Interactive Patient Education  2017 [employer]   Laparoscopic Gastric Band Surgery Laparoscopic gastric band  surgery is a procedure that helps you to lose weight. In this procedure, a band is put around the upper part of your stomach. This makes a small pouch that can hold only a small amount of food. The lower, bigger part of your stomach is below the band. The two parts stay connected by a small opening between the upper and the lower parts. Food goes through the opening to the lower part of your stomach more slowly than before the surgery. This means that less food is needed to make you feel full. You may have this procedure if:  You are very obese and you have not been able to lose weight through changes in your diet and increased physical activity.  You have health problems that are related to obesity, such as: ? Type 2 diabetes. ? Heart disease. ? Lung disease.  Tell a health care provider about:  Any allergies you have.  All medicines you are taking, including vitamins, herbs, eye drops, creams, and over-the-counter medicines.  Any problems you or family members have had with anesthetic medicines.  Any blood disorders you have.  Any surgeries you have had.  Any medical conditions you have. What are the risks? Generally, this is a safe procedure. However, problems may occur, including:  Bleeding.  Infection.  Allergic reactions to medicines.  Injury to your intestines, stomach, or other organs. This is rare.  Stomach ulcers.  Trouble with the tube (port) that connects the band to your stomach, such as the band slipping out of place. This would need to be fixed with another procedure. This is rare.  Vomiting.  Not getting enough nutrients for your body.  Problems with the gastric band. It may slip out of place or wear away.  Heartburn.  Inflamed stomach (gastritis).  Lack of weight loss.  Gallstones.  What happens before the procedure?  You may have tests, such as: ? Blood tests. ? Gallbladder ultrasound.  Manage your weight as told by your health care  provider. It is important to not gain weight before the procedure.  Follow instructions from your health care provider about eating or drinking restrictions.  Shower or bathe as told by your health care provider.  Plan to have someone take you home after the procedure.  If you go home right after the procedure, plan to have someone with you for 24 hours.  Ask your health care provider about: ? Changing or stopping your regular medicines. This is especially important if you are taking diabetes medicines or blood thinners. ? Taking medicines such as aspirin and ibuprofen. These medicines can thin your blood. Do not take these medicines before your procedure if your health care provider instructs you not to.  Do  not use any tobacco products-including cigarettes, chewing tobacco, or e-cigarettes-as told by your health care provider. If you need help quitting, ask your health care provider.  Ask your health care provider how your surgical site will be marked or identified.  You may be given antibiotic medicine to help prevent infection. What happens during the procedure?  To reduce your risk of infection: ? Your health care team will wash or sanitize their hands. ? Your skin will be washed with soap.  An IV tube will be inserted into one of your veins.  You will be given a medicine to make you fall asleep (general anesthetic).  Small cuts (incisions) will be made in your stomach.  A small camera (laparoscope) will be inserted into your stomach. This helps your surgeon to see what he or she is doing.  Surgical instruments will be used to put the band on your stomach.  A port will be put under the skin of your belly. This tube is connected to the band on your stomach.  Your surgeon will close the incisions.  Your incisions will be covered with a bandage (dressing). On the inner lining of the band around your stomach is a balloon. The balloon is empty during the procedure. At a later  visit, the balloon will be filled with fluid. Your health care provider will put the fluid in through the tube that is under the skin of your belly. This balloon will adjust how much food you are able to eat. The procedure may vary among health care providers and hospitals. What happens after the procedure?  Your blood pressure, heart rate, breathing rate and blood oxygen level will be monitored often until the medicines you were given have worn off.  You may be given medicine to help with any pain you have.  Tell your health care provider if you feel sick to your stomach (nauseous). You may receive medicine to help with this.  You will be encouraged to take deep breaths and cough. You will be shown how to do breathing exercises.  You will need to take another test. For this test, you will swallow a liquid that will show up on X-ray. You will have X-rays taken while you are in different positions. These X-rays will show: ? If the band is in the right place. ? How well your new stomach holds liquids. ? How the liquid moves.  You may be asked to walk as soon as you are stable. This information is not intended to replace advice given to you by your health care provider. Make sure you discuss any questions you have with your health care provider. Document Released: 10/03/2014 Document Revised: 06/20/2015 Document Reviewed: 01/23/2014   Laparoscopic Gastric Band Surgery Laparoscopic gastric band surgery is a procedure that helps you to lose weight. In this procedure, a band is put around the upper part of your stomach. This makes a small pouch that can hold only a small amount of food. The lower, bigger part of your stomach is below the band. The two parts stay connected by a small opening between the upper and the lower parts. Food goes through the opening to the lower part of your stomach more slowly than before the surgery. This means that less food is needed to make you feel full. You may have  this procedure if:  You are very obese and you have not been able to lose weight through changes in your diet and increased physical activity.  You have  health problems that are related to obesity, such as: ? Type 2 diabetes. ? Heart disease. ? Lung disease.  Tell a health care provider about:  Any allergies you have.  All medicines you are taking, including vitamins, herbs, eye drops, creams, and over-the-counter medicines.  Any problems you or family members have had with anesthetic medicines.  Any blood disorders you have.  Any surgeries you have had.  Any medical conditions you have. What are the risks? Generally, this is a safe procedure. However, problems may occur, including:  Bleeding.  Infection.  Allergic reactions to medicines.  Injury to your intestines, stomach, or other organs. This is rare.  Stomach ulcers.  Trouble with the tube (port) that connects the band to your stomach, such as the band slipping out of place. This would need to be fixed with another procedure. This is rare.  Vomiting.  Not getting enough nutrients for your body.  Problems with the gastric band. It may slip out of place or wear away.  Heartburn.  Inflamed stomach (gastritis).  Lack of weight loss.  Gallstones.  What happens before the procedure?  You may have tests, such as: ? Blood tests. ? Gallbladder ultrasound.  Manage your weight as told by your health care provider. It is important to not gain weight before the procedure.  Follow instructions from your health care provider about eating or drinking restrictions.  Shower or bathe as told by your health care provider.  Plan to have someone take you home after the procedure.  If you go home right after the procedure, plan to have someone with you for 24 hours.  Ask your health care provider about: ? Changing or stopping your regular medicines. This is especially important if you are taking diabetes medicines or  blood thinners. ? Taking medicines such as aspirin and ibuprofen. These medicines can thin your blood. Do not take these medicines before your procedure if your health care provider instructs you not to.  Do not use any tobacco products-including cigarettes, chewing tobacco, or e-cigarettes-as told by your health care provider. If you need help quitting, ask your health care provider.  Ask your health care provider how your surgical site will be marked or identified.  You may be given antibiotic medicine to help prevent infection. What happens during the procedure?  To reduce your risk of infection: ? Your health care team will wash or sanitize their hands. ? Your skin will be washed with soap.  An IV tube will be inserted into one of your veins.  You will be given a medicine to make you fall asleep (general anesthetic).  Small cuts (incisions) will be made in your stomach.  A small camera (laparoscope) will be inserted into your stomach. This helps your surgeon to see what he or she is doing.  Surgical instruments will be used to put the band on your stomach.  A port will be put under the skin of your belly. This tube is connected to the band on your stomach.  Your surgeon will close the incisions.  Your incisions will be covered with a bandage (dressing). On the inner lining of the band around your stomach is a balloon. The balloon is empty during the procedure. At a later visit, the balloon will be filled with fluid. Your health care provider will put the fluid in through the tube that is under the skin of your belly. This balloon will adjust how much food you are able to eat. The procedure may  vary among health care providers and hospitals. What happens after the procedure?  Your blood pressure, heart rate, breathing rate and blood oxygen level will be monitored often until the medicines you were given have worn off.  You may be given medicine to help with any pain you  have.  Tell your health care provider if you feel sick to your stomach (nauseous). You may receive medicine to help with this.  You will be encouraged to take deep breaths and cough. You will be shown how to do breathing exercises.  You will need to take another test. For this test, you will swallow a liquid that will show up on X-ray. You will have X-rays taken while you are in different positions. These X-rays will show: ? If the band is in the right place. ? How well your new stomach holds liquids. ? How the liquid moves.  You may be asked to walk as soon as you are stable. This information is not intended to replace advice given to you by your health care provider. Make sure you discuss any questions you have with your health care provider. Document Released: 10/03/2014 Document Revised: 06/20/2015 Document Reviewed: 01/23/2014 Elsevier Interactive Patient Education  2018 [employer].  Chartered certified accountant Patient Education  [employer].

## 2017-12-10 DIAGNOSIS — H52223 Regular astigmatism, bilateral: Secondary | ICD-10-CM | POA: Diagnosis not present

## 2017-12-10 DIAGNOSIS — H5203 Hypermetropia, bilateral: Secondary | ICD-10-CM | POA: Diagnosis not present

## 2017-12-10 DIAGNOSIS — H524 Presbyopia: Secondary | ICD-10-CM | POA: Diagnosis not present

## 2017-12-10 DIAGNOSIS — Z79899 Other long term (current) drug therapy: Secondary | ICD-10-CM | POA: Diagnosis not present

## 2017-12-16 DIAGNOSIS — M1611 Unilateral primary osteoarthritis, right hip: Secondary | ICD-10-CM | POA: Diagnosis not present

## 2017-12-16 DIAGNOSIS — M329 Systemic lupus erythematosus, unspecified: Secondary | ICD-10-CM | POA: Diagnosis not present

## 2017-12-16 DIAGNOSIS — R5383 Other fatigue: Secondary | ICD-10-CM | POA: Diagnosis not present

## 2017-12-16 DIAGNOSIS — R7303 Prediabetes: Secondary | ICD-10-CM | POA: Diagnosis not present

## 2017-12-17 ENCOUNTER — Other Ambulatory Visit: Payer: Self-pay | Admitting: Obstetrics and Gynecology

## 2017-12-17 ENCOUNTER — Ambulatory Visit
Admission: RE | Admit: 2017-12-17 | Discharge: 2017-12-17 | Disposition: A | Discharge status: Home / Self Care | Payer: 59 | Source: Ambulatory Visit | Attending: Obstetrics and Gynecology | Admitting: Obstetrics and Gynecology

## 2017-12-17 DIAGNOSIS — Z1231 Encounter for screening mammogram for malignant neoplasm of breast: Secondary | ICD-10-CM

## 2017-12-17 DIAGNOSIS — R5383 Other fatigue: Secondary | ICD-10-CM | POA: Diagnosis not present

## 2017-12-24 ENCOUNTER — Other Ambulatory Visit (HOSPITAL_COMMUNITY): Payer: Self-pay | Admitting: General Surgery

## 2017-12-29 ENCOUNTER — Encounter: Payer: 59 | Attending: General Surgery | Admitting: Skilled Nursing Facility1

## 2017-12-29 ENCOUNTER — Encounter: Payer: Self-pay | Admitting: Skilled Nursing Facility1

## 2017-12-29 DIAGNOSIS — E66813 Obesity, class 3: Secondary | ICD-10-CM

## 2017-12-29 DIAGNOSIS — Z713 Dietary counseling and surveillance: Secondary | ICD-10-CM | POA: Diagnosis not present

## 2017-12-29 DIAGNOSIS — Z6841 Body Mass Index (BMI) 40.0 and over, adult: Secondary | ICD-10-CM | POA: Insufficient documentation

## 2017-12-29 DIAGNOSIS — R7303 Prediabetes: Secondary | ICD-10-CM | POA: Insufficient documentation

## 2017-12-29 DIAGNOSIS — M329 Systemic lupus erythematosus, unspecified: Secondary | ICD-10-CM | POA: Diagnosis not present

## 2017-12-29 DIAGNOSIS — M1611 Unilateral primary osteoarthritis, right hip: Secondary | ICD-10-CM | POA: Diagnosis not present

## 2017-12-29 DIAGNOSIS — Z79899 Other long term (current) drug therapy: Secondary | ICD-10-CM | POA: Diagnosis not present

## 2017-12-29 NOTE — Progress Notes (Signed)
Pre-Op Assessment Visit:  Pre-Operative Sleeve Gastrectomy Surgery  Medical Nutrition Therapy:  Appt start time: 3:17  End time:  4:17  Patient was seen on 12/29/2017 for Pre-Operative Nutrition Assessment. Assessment and letter of approval faxed to [facility] coordinator on 12/29/2017.   Pt states she loves to work out. Pt states she is ready for the life long change.   Pt expectation of surgery: to be able to work out again  Pt expectation of Dietitian: none identified   Start weight at [facility]: 358.3 BMI: 52.90  24 hr Dietary Recall: First Meal: skipped or half an apple or toast and egg Snack: nuts Second Meal: salad or grilled meat  Snack: fruit and cheese Third Meal: taco or soup or sandwich or subway Snack: cereal Beverages: water, sugar free flavorings, diet soda, alkaline water  Encouraged to engage in 150 minutes of moderate physical activity including cardiovascular and weight baring weekly  Handouts given during visit include:  . Pre-Op Goals . Bariatric Surgery Protein Shakes During the appointment today the following Pre-Op Goals were reviewed with the patient: . Maintain or lose weight as instructed by your surgeon . Make healthy food choices . Begin to limit portion sizes . Limited concentrated sugars and fried foods . Keep fat/sugar in the single digits per serving on             food labels . Practice CHEWING your food  (aim for 30 chews per bite or until applesauce consistency) . Practice not drinking 15 minutes before, during, and 30 minutes after each meal/snack . Avoid all carbonated beverages  . Avoid/limit caffeinated beverages  . Avoid all sugar-sweetened beverages . Consume 3 meals per day; eat every 3-5 hours . Make a list of non-food related activities . Aim for 64-100 ounces of FLUID daily  . Aim for at least 60-80 grams of PROTEIN daily . Look for a liquid protein source that contain ?15 g protein and ?5 g  carbohydrate  (ex: shakes, drinks, shots)  -Follow diet recommendations listed below   Energy and Macronutrient Recomendations: Calories: 1500 Carbohydrate: 170 Protein: 112 Fat: 42  Demonstrated degree of understanding via:  Teach Back  Teaching Method Utilized: Visual Auditory Hands on  Barriers to learning/adherence to lifestyle change: none identified   Patient to call the Nutrition [facility] Education Services to enroll in Pre-Op and Post-Op Nutrition Education when surgery date is scheduled.

## 2017-12-31 DIAGNOSIS — F509 Eating disorder, unspecified: Secondary | ICD-10-CM | POA: Diagnosis not present

## 2018-01-07 ENCOUNTER — Ambulatory Visit (HOSPITAL_COMMUNITY)
Admission: RE | Admit: 2018-01-07 | Discharge: 2018-01-07 | Disposition: A | Discharge status: Home / Self Care | Payer: 59 | Source: Ambulatory Visit | Attending: General Surgery | Admitting: General Surgery

## 2018-01-07 ENCOUNTER — Other Ambulatory Visit: Payer: Self-pay

## 2018-01-07 DIAGNOSIS — E559 Vitamin D deficiency, unspecified: Secondary | ICD-10-CM | POA: Diagnosis not present

## 2018-01-07 DIAGNOSIS — Z01818 Encounter for other preprocedural examination: Secondary | ICD-10-CM | POA: Diagnosis not present

## 2018-01-10 ENCOUNTER — Ambulatory Visit: Payer: 59 | Admitting: Skilled Nursing Facility1

## 2018-01-14 NOTE — Progress Notes (Deleted)
Office Visit Note  Patient: Julia Monroe             Date of Birth: August 30, 1970           MRN: [id number]             PCP: Lucretia Kern, DO Referring: Lucretia Kern, DO Visit Date: 01/28/2018 Occupation: @GUAROCC @  Subjective:  No chief complaint on file.  Plaquenil.  Last Plaquenil eye exam normal on 11/24/16. Most recent CBC/CMP within normal limits on 08/26/17.  History of Present Illness: Julia Monroe is a 47 y.o. female ***   Activities of Daily Living:  Patient reports morning stiffness for *** {minute/hour:19697}.   Patient {ACTIONS;DENIES/REPORTS:21021675::"Denies"} nocturnal pain.  Difficulty dressing/grooming: {ACTIONS;DENIES/REPORTS:21021675::"Denies"} Difficulty climbing stairs: {ACTIONS;DENIES/REPORTS:21021675::"Denies"} Difficulty getting out of chair: {ACTIONS;DENIES/REPORTS:21021675::"Denies"} Difficulty using hands for taps, buttons, cutlery, and/or writing: {ACTIONS;DENIES/REPORTS:21021675::"Denies"}  No Rheumatology ROS completed.   PMFS History:  Patient Active Problem List   Diagnosis Date Noted  . History of migraine 08/27/2017  . Class 3 severe obesity due to excess calories without serious comorbidity with body mass index (BMI) of 50.0 to 59.9 in adult ([place]) 01/27/2017  . History of total hip replacement, left 10/27/2016  . Hyperglycemia 08/25/2016  . Leukocytosis 08/13/2016  . Status post laparoscopic hysterectomy 07/06/2016  . Primary osteoarthritis of both knees 04/29/2016  . ANA positive 04/29/2016  . Primary osteoarthritis of right hip 07/10/2011    Past Medical History:  Diagnosis Date  . Arthritis    osteoarthritis  . Complication of anesthesia    bottom of feet itching  . Elevated hemoglobin A1c 2018   6.1  . Headache(784.0)    hx migraines  . Lupus ([place]) 2018   Dr.Deveshwar    Family History  Problem Relation Age of Onset  . Osteoarthritis Mother   . Osteoarthritis Brother   . Hypertension Brother   .  Osteoarthritis Maternal Grandmother   . Arthritis Maternal Grandmother    Past Surgical History:  Procedure Laterality Date  . ABDOMINAL HYSTERECTOMY    . CESAREAN SECTION  2000  . CYSTOSCOPY N/A 07/06/2016   Procedure: CYSTOSCOPY;  Surgeon: Nunzio Cobbs, MD;  Location: [facility];  Service: Gynecology;  Laterality: N/A;  . JOINT REPLACEMENT  L   Left hip replacement  . OOPHORECTOMY  2007   Rt.oophorectomy   . OVARIAN CYST SURGERY  ? and 10   rt removed,cyst off lft  . TOTAL HIP ARTHROPLASTY  07/06/2011   Procedure: TOTAL HIP ARTHROPLASTY;  Surgeon: Kerin Salen, MD;  Location: [place];  Service: Orthopedics;  Laterality: Left;  DEPUY/PINNACLE  . TOTAL LAPAROSCOPIC HYSTERECTOMY WITH SALPINGECTOMY Left 07/06/2016   Procedure: HYSTERECTOMY TOTAL LAPAROSCOPIC WITH Left SALPINGECTOMY, Suture Left Side Wall Laceration;  Surgeon: Nunzio Cobbs, MD;  Location: [facility];  Service: Gynecology;  Laterality: Left;   Social History   Social History Narrative   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[employer]      Home Situation: 48 yo daughter in 2018      Spiritual Beliefs:      Lifestyle: starting healthy low sugar diet 08/2016/[facility]    Objective: Vital Signs: LMP 04/29/2016    Physical Exam   Musculoskeletal Exam: ***  CDAI Exam: CDAI Score: Not documented Patient Global Assessment: Not documented; Provider Global Assessment: Not documented Swollen: Not documented; Tender: Not documented Joint Exam   Not documented   There is currently no information documented on the  homunculus. Go to the Rheumatology activity and complete the homunculus joint exam.  Investigation: No additional findings.  Imaging: Dg Chest 2 View  Result Date: 01/07/2018 CLINICAL DATA:  Preoperative evaluation for gastric sleeve procedure EXAM: CHEST - 2 VIEW COMPARISON:  June 29, 2011. FINDINGS: No edema or consolidation. The heart size and pulmonary vascularity are normal.  No adenopathy. There is slight degenerative change in the thoracic spine. IMPRESSION: No edema or consolidation. Electronically Signed   By: Lowella Grip III M.D.   On: 01/07/2018 11:33   Dg Ugi W/high Density W/kub  Result Date: 01/07/2018 CLINICAL DATA:  Preoperative evaluation for sleeve gastrectomy. EXAM: UPPER GI SERIES WITH KUB TECHNIQUE: After obtaining a scout radiograph a routine upper GI series was performed using thin barium FLUOROSCOPY TIME:  Fluoroscopy Time:  1 minutes and 54 seconds. Radiation Exposure Index (if provided by the fluoroscopic device): 159 mGy Number of Acquired Spot Images: COMPARISON:  None. FINDINGS: Pre-procedure KUB shows a normal bowel gas pattern with no unexpected abdominopelvic calcification. Frontal and lateral views of the hypopharynx while swallowing are normal. Oblique views of the esophagus are normal without evidence for stricture, mass lesion, or gross mucosal ulceration. Esophageal motility is normal. No hiatal hernia. Stomach is normal without evidence for mucosal ulceration or mass lesion. Gastric emptying is prompt. Pylorus is normal. Duodenal C-loop and ligament of Treitz are in the expected anatomic location. IMPRESSION: Normal single contrast upper GI series. Electronically Signed   By: Misty Stanley M.D.   On: 01/07/2018 11:40   Mm Digital Screening Bilateral  Result Date: 12/20/2017 CLINICAL DATA:  Screening. EXAM: DIGITAL SCREENING BILATERAL MAMMOGRAM WITH CAD COMPARISON:  Previous exam(s). ACR Breast Density Category a: The breast tissue is almost entirely fatty. FINDINGS: There are no findings suspicious for malignancy. Images were processed with CAD. IMPRESSION: No mammographic evidence of malignancy. A result letter of this screening mammogram will be mailed directly to the patient. RECOMMENDATION: Screening mammogram in one year. (Code:SM-B-01Y) BI-RADS CATEGORY  1: Negative. Electronically Signed   By: Ammie Ferrier M.D.   On: 12/20/2017  13:07    Recent Labs: Lab Results  Component Value Date   WBC 8.7 01/27/2017   HGB 12.4 01/27/2017   PLT 329 01/27/2017   NA 138 01/27/2017   K 4.3 01/27/2017   CL 104 01/27/2017   CO2 26 01/27/2017   GLUCOSE 92 01/27/2017   BUN 11 01/27/2017   CREATININE 0.70 01/27/2017   BILITOT 0.3 01/27/2017   ALKPHOS 62 04/29/2016   AST 11 01/27/2017   ALT 7 01/27/2017   PROT 6.9 01/27/2017   ALBUMIN 3.8 04/29/2016   CALCIUM 9.0 01/27/2017   GFRAA 122 01/27/2017    Speciality Comments: PLQ eye exam: 11/24/2016 Normal. Dr. Thalia Bloodgood. Follow up in 1 year.  Procedures:  No procedures performed Allergies: Monosodium glutamate; Lactose intolerance (gi); Caffeine; Hydrocodone; and Oxycodone   Assessment / Plan:     Visit Diagnoses: No diagnosis found.   Orders: No orders of the defined types were placed in this encounter.  No orders of the defined types were placed in this encounter.   Face-to-face time spent with patient was *** minutes. Greater than 50% of time was spent in counseling and coordination of care.  Follow-Up Instructions: No follow-ups on file.   Earnestine Mealing, CMA  Note - This record has been created using Editor, commissioning.  Chart creation errors have been sought, but may not always  have been located. Such creation errors do  not reflect on  the standard of medical care.

## 2018-01-22 DIAGNOSIS — F509 Eating disorder, unspecified: Secondary | ICD-10-CM | POA: Diagnosis not present

## 2018-01-26 DIAGNOSIS — B009 Herpesviral infection, unspecified: Secondary | ICD-10-CM

## 2018-01-26 HISTORY — DX: Herpesviral infection, unspecified: B00.9

## 2018-01-28 ENCOUNTER — Ambulatory Visit: Payer: 59 | Admitting: Physician Assistant

## 2018-01-31 ENCOUNTER — Encounter: Payer: Self-pay | Admitting: Skilled Nursing Facility1

## 2018-01-31 ENCOUNTER — Encounter: Payer: 59 | Attending: General Surgery | Admitting: Skilled Nursing Facility1

## 2018-01-31 DIAGNOSIS — M1611 Unilateral primary osteoarthritis, right hip: Secondary | ICD-10-CM | POA: Insufficient documentation

## 2018-01-31 DIAGNOSIS — M329 Systemic lupus erythematosus, unspecified: Secondary | ICD-10-CM | POA: Diagnosis not present

## 2018-01-31 DIAGNOSIS — Z6841 Body Mass Index (BMI) 40.0 and over, adult: Secondary | ICD-10-CM | POA: Diagnosis not present

## 2018-01-31 DIAGNOSIS — R7303 Prediabetes: Secondary | ICD-10-CM | POA: Diagnosis not present

## 2018-01-31 DIAGNOSIS — Z713 Dietary counseling and surveillance: Secondary | ICD-10-CM | POA: Insufficient documentation

## 2018-01-31 DIAGNOSIS — Z79899 Other long term (current) drug therapy: Secondary | ICD-10-CM | POA: Insufficient documentation

## 2018-01-31 NOTE — Progress Notes (Signed)
Pre-Operative Nutrition Class:  Appt start time: 9629   End time:  1830.  Patient was seen on 01/31/2018 for Pre-Operative Bariatric Surgery Education [facility].   Surgery date:  Surgery type: sleeve Start weight at [facility] Weight today: 360.6  Samples given per MNT protocol. Patient educated on appropriate usage: Bariatric Advantage Multivitamin Lot # [id number] Exp: 04/21  Bariatric Advantage Calcium  Lot # [id number] Exp: 02/28/2019  Premier Protein  Shake Lot # [id number] Exp: march 2020 Unjury Protein Powder  Shake Lot # Exp:   The following the learning objectives were met by the patient during this course:  Identify Pre-Op Dietary Goals and will begin 2 weeks pre-operatively  Identify appropriate sources of fluids and proteins   State protein recommendations and appropriate sources pre and post-operatively  Identify Post-Operative Dietary Goals and will follow for 2 weeks post-operatively  Identify appropriate multivitamin and calcium sources  Describe the need for physical activity post-operatively and will follow MD recommendations  State when to call healthcare provider regarding medication questions or post-operative complications  Handouts given during class include:  Pre-Op Bariatric Surgery Diet Handout  Protein Shake Handout  Post-Op Bariatric Surgery Nutrition Handout  BELT Program Information Flyer  Support Group Information Flyer  [facility] Bariatric Supplements Price List  Follow-Up Plan: Patient will follow-up at [facility] 2 weeks post operatively for diet advancement per MD.

## 2018-02-10 DIAGNOSIS — R7303 Prediabetes: Secondary | ICD-10-CM | POA: Diagnosis not present

## 2018-02-10 LAB — HEMOGLOBIN A1C: Hemoglobin A1C: 6

## 2018-02-11 DIAGNOSIS — Z1211 Encounter for screening for malignant neoplasm of colon: Secondary | ICD-10-CM | POA: Diagnosis not present

## 2018-02-14 LAB — FECAL OCCULT BLOOD, IMMUNOCHEMICAL: Fecal Occult Bld: NEGATIVE

## 2018-02-14 LAB — SPECIMEN STATUS REPORT

## 2018-02-15 DIAGNOSIS — M1611 Unilateral primary osteoarthritis, right hip: Secondary | ICD-10-CM | POA: Diagnosis not present

## 2018-03-10 ENCOUNTER — Encounter: Payer: Self-pay | Admitting: Family Medicine

## 2018-03-10 NOTE — Progress Notes (Signed)
Need orders in epic.  Preop on 03/15/2018.

## 2018-03-14 NOTE — Progress Notes (Signed)
01-07-18 (Epic) EKG, CXR

## 2018-03-14 NOTE — Patient Instructions (Addendum)
Julia Monroe  03/14/2018   Your procedure is scheduled on: 03-21-18    Report to [facility] Main  Entrance    Report to Admitting at 7:25 AM    Call this number if you have problems the morning of surgery [phone]    Remember: Do not eat food or drink liquids :After Midnight.     BRUSH YOUR TEETH MORNING OF SURGERY AND RINSE YOUR MOUTH OUT, NO CHEWING GUM CANDY OR MINTS.     Take these medicines the morning of surgery with A SIP OF WATER: None. You may use and bring eyedrops.                                You may not have any metal on your body including hair pins and              piercings  Do not wear jewelry, make-up, lotions, powders or perfumes, deodorant             Do not wear nail polish.  Do not shave  48 hours prior to surgery.               Do not bring valuables to the hospital. [place].  Contacts, dentures or bridgework may not be worn into surgery.  Leave suitcase in the car. After surgery it may be brought to your room.     Patients discharged the day of surgery will not be allowed to drive home. IF YOU ARE HAVING SURGERY AND GOING HOME THE SAME DAY, YOU MUST HAVE AN ADULT TO DRIVE YOU HOME AND BE WITH YOU FOR 24 HOURS. YOU MAY GO HOME BY TAXI OR UBER OR ORTHERWISE, BUT AN ADULT MUST ACCOMPANY YOU HOME AND STAY WITH YOU FOR 24 HOURS.    Special Instructions: N/A              Please read over the following fact sheets you were given: _____________________________________________________________________             [facility] - Preparing for Surgery Before surgery, you can play an important role.  Because skin is not sterile, your skin needs to be as free of germs as possible.  You can reduce the number of germs on your skin by washing with CHG (chlorahexidine gluconate) soap before surgery.  CHG is an antiseptic cleaner which kills germs and bonds with the skin to continue  killing germs even after washing. Please DO NOT use if you have an allergy to CHG or antibacterial soaps.  If your skin becomes reddened/irritated stop using the CHG and inform your nurse when you arrive at Short Stay. Do not shave (including legs and underarms) for at least 48 hours prior to the first CHG shower.  You may shave your face/neck. Please follow these instructions carefully:  1.  Shower with CHG Soap the night before surgery and the  morning of Surgery.  2.  If you choose to wash your hair, wash your hair first as usual with your  normal  shampoo.  3.  After you shampoo, rinse your hair and body thoroughly to remove the  shampoo.  4.  Use CHG as you would any other liquid soap.  You can apply chg directly  to the skin and wash                       Gently with a scrungie or clean washcloth.  5.  Apply the CHG Soap to your body ONLY FROM THE NECK DOWN.   Do not use on face/ open                           Wound or open sores. Avoid contact with eyes, ears mouth and genitals (private parts).                       Wash face,  Genitals (private parts) with your normal soap.             6.  Wash thoroughly, paying special attention to the area where your surgery  will be performed.  7.  Thoroughly rinse your body with warm water from the neck down.  8.  DO NOT shower/wash with your normal soap after using and rinsing off  the CHG Soap.                9.  Pat yourself dry with a clean towel.            10.  Wear clean pajamas.            11.  Place clean sheets on your bed the night of your first shower and do not  sleep with pets. Day of Surgery : Do not apply any lotions/deodorants the morning of surgery.  Please wear clean clothes to the hospital/surgery center.  FAILURE TO FOLLOW THESE INSTRUCTIONS MAY RESULT IN THE CANCELLATION OF YOUR SURGERY PATIENT SIGNATURE_________________________________  NURSE  SIGNATURE__________________________________  ________________________________________________________________________

## 2018-03-15 ENCOUNTER — Encounter (HOSPITAL_COMMUNITY)
Admission: RE | Admit: 2018-03-15 | Discharge: 2018-03-15 | Disposition: A | Discharge status: Home / Self Care | Payer: 59 | Source: Ambulatory Visit | Attending: General Surgery | Admitting: General Surgery

## 2018-03-15 ENCOUNTER — Encounter (HOSPITAL_COMMUNITY): Payer: Self-pay

## 2018-03-15 ENCOUNTER — Encounter (INDEPENDENT_AMBULATORY_CARE_PROVIDER_SITE_OTHER): Payer: Self-pay

## 2018-03-15 ENCOUNTER — Other Ambulatory Visit: Payer: Self-pay

## 2018-03-15 DIAGNOSIS — Z01818 Encounter for other preprocedural examination: Secondary | ICD-10-CM | POA: Insufficient documentation

## 2018-03-15 LAB — PREGNANCY, URINE: Preg Test, Ur: NEGATIVE

## 2018-03-15 LAB — BASIC METABOLIC PANEL
Anion gap: 10 (ref 5–15)
BUN: 12 mg/dL (ref 6–20)
CO2: 24 mmol/L (ref 22–32)
Calcium: 9.4 mg/dL (ref 8.9–10.3)
Chloride: 103 mmol/L (ref 98–111)
Creatinine, Ser: 0.61 mg/dL (ref 0.44–1.00)
GFR calc Af Amer: >60 mL/min (ref >60)
GFR calc non Af Amer: >60 mL/min (ref >60)
Glucose, Bld: 87 mg/dL (ref 70–99)
Potassium: 3.9 mmol/L (ref 3.5–5.1)
SODIUM: 137 mmol/L (ref 135–145)

## 2018-03-15 LAB — CBC
HCT: 40.7 % (ref 36.0–46.0)
Hemoglobin: 12.7 g/dL (ref 12.0–15.0)
MCH: 27.1 pg (ref 26.0–34.0)
MCHC: 31.2 g/dL (ref 30.0–36.0)
MCV: 87 fL (ref 80.0–100.0)
Platelets: 312 10*3/uL (ref 150–400)
RBC: 4.68 MIL/uL (ref 3.87–5.11)
RDW: 14.9 % (ref 11.5–15.5)
WBC: 10.8 10*3/uL — ABNORMAL HIGH (ref 4.0–10.5)
nRBC: 0 % (ref 0.0–0.2)

## 2018-03-16 ENCOUNTER — Ambulatory Visit: Payer: Self-pay | Admitting: General Surgery

## 2018-03-17 ENCOUNTER — Other Ambulatory Visit: Payer: Self-pay | Admitting: *Deleted

## 2018-03-17 ENCOUNTER — Ambulatory Visit: Payer: Self-pay | Admitting: General Surgery

## 2018-03-17 DIAGNOSIS — M329 Systemic lupus erythematosus, unspecified: Secondary | ICD-10-CM | POA: Diagnosis not present

## 2018-03-17 DIAGNOSIS — R7303 Prediabetes: Secondary | ICD-10-CM | POA: Diagnosis not present

## 2018-03-17 NOTE — Progress Notes (Signed)
MORNING OF SURGERY DRINK:  1SHAKE BEFORE YOU LEAVE HOME, DRINK ALL OF THE SHAKE AT ONE TIME AT 625 AM NO SOLID FOOD AFTER 600 PM THE NIGHT BEFORE YOUR SURGERY. YOU MAY DRINK CLEAR FLUIDS. THE SHAKE YOU DRINK BEFORE YOU LEAVE HOME WILL BE THE LAST FLUIDS YOU DRINK BEFORE SURGERY.  PAIN IS EXPECTED AFTER SURGERY AND WILL NOT BE COMPLETELY ELIMINATED. AMBULATION AND TYLENOL WILL HELP REDUCE INCISIONAL AND GAS PAIN. MOVEMENT IS KEY!  YOU ARE EXPECTED TO BE OUT OF BED WITHIN 4 HOURS OF ADMISSION TO YOUR PATIENT ROOM.  SITTING IN THE RECLINER THROUGHOUT THE DAY IS IMPORTANT FOR DRINKING FLUIDS AND MOVING GAS THROUGHOUT THE GI TRACT.  COMPRESSION STOCKINGS SHOULD BE WORN [place] UNLESS YOU ARE WALKING.   INCENTIVE SPIROMETER SHOULD BE USED EVERY HOUR WHILE AWAKE TO DECREASE POST-OPERATIVE COMPLICATIONS SUCH AS PNEUMONIA.  WHEN DISCHARGED HOME, IT IS IMPORTANT TO CONTINUE TO WALK EVERY HOUR AND USE THE INCENTIVE SPIROMETER EVERY HOUR.

## 2018-03-17 NOTE — H&P (Signed)
Julia Monroe Documented: 03/17/2018 3:06 PM Location: [facility] Patient #: [id number] DOB: 1970/03/27 Single / Language: Julia Monroe / Race: Black or African American Female  History of Present Illness Julia Hiss M. Desirre Eickhoff MD; 03/17/2018 3:23 PM) The patient is a 48 year old female who presents for a bariatric surgery evaluation. She comes in today for preoperative appointment. She is scheduled for laparoscopic sleeve gastrectomy next Monday. She denies any medical changes since I initially met her in November. She denies any trips to the emergency room or hospital. She denies any blood thinners. She denies any tobacco use. She denies any chest pain, chest pressure, source of breath, orthopnea, paroxysmal nocturnal dyspnea or angina. She states that her lupus is still well controlled. She had an upper GI which was within normal limits. Chest x-ray and mammogram are negative. Hemoglobin A1c was 6. Other preoperative labs were unremarkable.   11/2017 She is referred by Dr Maudie Mercury for evaluation of weight loss surgery. She attended our seminar in person. She is interested in sleeve gastrectomy. She is not interested in having intestinal rerouting. She is trying to get her weight under control as well as ultimately have right hip surgery. Despite numerous attempts for sustained weight loss she's been unsuccessful. She has tried Weight Watchers, low-carb dieting, calorie tracking all without any long-term success. She states that she really enjoys physical activity and walking and exercising but due to her right hip pain this limits her physical activity.  Her comorbidities include systemic lupus, prediabetes, osteoarthritis right hip  She denies any chest pain, chest pressure, source of breath, orthopnea, paroxysmal nocturnal dyspnea, peripheral edema, personal family history of blood clots. She denies any dyspnea on exertion. Her stop bang score was low. She denies any heartburn  issues or indigestion or reflux. She may have one or 2 episodes of heartburn a year. She denies any melena, hematochezia, nausea or vomiting. She is a daily bowel movement. She has had a C-section and total hysterectomy. No other abdominal surgery. She has had her left hip replaced. She now has chronic right hip pain. She will get migraines from MSG. She denies any tobacco or drug use. She drinks alcohol on a very rare occasion. She denies any TIAs or amaurosis fugax. 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   Problem List/Past Medical Julia Hiss M. Redmond Pulling, MD; 03/17/2018 3:23 PM) FATIGUE, UNSPECIFIED TYPE (R53.83) SYSTEMIC LUPUS ERYTHEMATOSUS, UNSPECIFIED SLE TYPE, UNSPECIFIED ORGAN INVOLVEMENT STATUS (M32.9) SEVERE OBESITY (E66.01) PREDIABETES (R73.03)  Past Surgical History Julia Hiss M. Redmond Pulling, MD; 03/17/2018 3:23 PM) Hip Surgery Left. Hysterectomy (not due to cancer) - Partial  Diagnostic Studies History Julia Hiss M. Redmond Pulling, MD; 03/17/2018 3:23 PM) Colonoscopy never Mammogram 1-3 years ago Pap Smear 1-5 years ago  Allergies Julia Monroe, CMA; 03/17/2018 3:06 PM) No Known Drug Allergies [12/16/2017]: Allergies Reconciled  Medication History Julia Monroe, CMA; 03/17/2018 3:06 PM) traMADol HCl (50MG Tablet, Oral) Active. Hydroxychloroquine Sulfate (200MG Tablet, Oral) Active. Diclofenac Sodium (1% Gel, Transdermal) Active. Medications Reconciled  Social History Julia Hiss M. Redmond Pulling, MD; 03/17/2018 3:23 PM) Alcohol use Occasional alcohol use. Caffeine use Carbonated beverages. No drug use Tobacco use Never smoker.  Family History Julia Hiss M. Redmond Pulling, MD; 03/17/2018 3:23 PM) Arthritis Brother, Family Members In General, Mother, Sister.  Pregnancy / Birth History Julia Hiss M. Redmond Pulling, MD; 03/17/2018 3:23 PM) Age at menarche 28 years. Gravida 1 Maternal age 38-30 Para 1  Other Problems Julia Ruff. Redmond Pulling, MD; 03/17/2018 3:23 PM) Oophorectomy Right. ARTHRITIS OF  RIGHT HIP (M16.11)  Review of Systems Julia Hiss M. Charo Philipp MD; 03/17/2018 3:22 PM) All other systems negative  Vitals (Julia Monroe CMA; 03/17/2018 3:07 PM) 03/17/2018 3:06 PM Weight: 355.25 lb Height: 70.5in Waist: 57.5in Body Surface Area: 2.68 m Body Mass Index: 50.25 kg/m  Temp.: 97.8F(Temporal)  Pulse: 96 (Regular)  P.OX: 97% (Room air) BP: 126/60 (Sitting, Left Arm, Standard)      Physical Exam Julia Hiss M. Julia Morissette MD; 03/17/2018 3:21 PM)  General Mental Status-Alert. General Appearance-Consistent with stated age. Hydration-Well hydrated. Voice-Normal. Note: severe obesity; evenly distributed  Head and Neck Head-normocephalic, atraumatic with no lesions or palpable masses. Trachea-midline. Thyroid Gland Characteristics - normal size and consistency.  Eye Eyeball - Bilateral-Extraocular movements intact. Sclera/Conjunctiva - Bilateral-No scleral icterus.  ENMT Ears -Note:nml ext ears.  Mouth and Throat -Note:lips intact.   Chest and Lung Exam Chest and lung exam reveals -quiet, even and easy respiratory effort with no use of accessory muscles and on auscultation, normal breath sounds, no adventitious sounds and normal vocal resonance. Inspection Chest Wall - Normal. Back - normal.  Breast - Did not examine.  Cardiovascular Cardiovascular examination reveals -normal heart sounds, regular rate and rhythm with no murmurs and normal pedal pulses bilaterally.  Abdomen Inspection Inspection of the abdomen reveals - No Hernias. Skin - Scar - Note: old c/s scar old trocar sites. Palpation/Percussion Palpation and Percussion of the abdomen reveal - Soft, Non Tender, No Rebound tenderness, No Rigidity (guarding) and No hepatosplenomegaly. Auscultation Auscultation of the abdomen reveals - Bowel sounds normal.  Peripheral Vascular Upper Extremity Palpation - Pulses bilaterally normal.  Neurologic Neurologic evaluation  reveals -alert and oriented x 3 with no impairment of recent or remote memory. Mental Status-Normal.  Neuropsychiatric The patient's mood and affect are described as -normal. Judgment and Insight-insight is appropriate concerning matters relevant to self.  Musculoskeletal Normal Exam - Left-Upper Extremity Strength Normal and Lower Extremity Strength Normal. Normal Exam - Right-Upper Extremity Strength Normal and Lower Extremity Strength Normal. Note: b/l knee crepitus  Lymphatic Head & Neck  General Head & Neck Lymphatics: Bilateral - Description - Normal. Axillary - Did not examine. Femoral & Inguinal - Did not examine.    Assessment & Plan Julia Hiss M. Ottis Sarnowski MD; 03/17/2018 3:21 PM)  SEVERE OBESITY (E66.01) Impression: The patient meets weight loss surgery criteria. I think the patient would be an acceptable candidate for Laparoscopic vertical sleeve gastrectomy.  We reviewed her preoperative workup. We rediscussed the typical hospital course as well as the typical recovery. We discussed the typical postoperative issues that were routinely see. She has already had her preoperative education class. We discussed the importance of the preoperative meal plan. 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 We did discuss that this might place her at slightly increased risk for leak but I don't think it would be statistically significant.   PREDIABETES (R73.03)  Julia Ruff. Redmond Pulling, MD, FACS General, Bariatric, & Minimally Invasive Surgery [facility], [place]

## 2018-03-17 NOTE — Patient Outreach (Addendum)
[facility]) Care Management  03/17/2018  Alton Tremblay 04-24-1970 [id number]   Preoperative Screening Call Referral received: 03/08/18 Surgery date: 03/21/18 Insurance: [facility] Choice Plan  Subjective:  Initial successful telephone call to patient's preferred number in order to complete preoperative screening. 2 HIPAA identifiers verified. Discussed purpose of preoperative call. Patient voices understanding and agrees to call. She states she understands the reasons for the surgery and the expected time of arrival. She says she completed her pre- op testing on 03/15/18 and has no additional questions. She says she expects to be in the hospital 1-2 days.  She states she has completed medical leave paperwork and short term disability paperwork. She says she has the hospital indemnity benefit and is aware she will have to file the claim after her surgery. She says she will have 24/7 care at home provided by her Mom to assist in her recovery. She says she received advanced directive information at her preadmission testing appointment.  She agrees to a post hospital discharge transition of care call.    Objective:  Per chart review, patient scheduled for LAPAROSCOPIC GASTRIC SLEEVE RESECTION, UPPER ENDO on 03/21/18 at [facility].   Assessment: Preoperative call completed, no preoperative needs identified.   Plan: RNCM will call patient for transition of care outreach within 72 hours of hospital discharge notification.   Barrington Ellison RN,CCM,CDE [place] Management Coordinator Office Phone [phone] Office Fax [phone]

## 2018-03-17 NOTE — H&P (View-Only) (Signed)
Julia Monroe Documented: 03/17/2018 3:06 PM Location: [facility] Patient #: [id number] DOB: 07/21/1970 Single / Language: English / Race: Black or African American Female  History of Present Illness ([name] M. Macenzie Burford MD; 03/17/2018 3:23 PM) The patient is a 48 year old female who presents for a bariatric surgery evaluation. She comes in today for preoperative appointment. She is scheduled for laparoscopic sleeve gastrectomy next Monday. She denies any medical changes since I initially met her in November. She denies any trips to the emergency room or hospital. She denies any blood thinners. She denies any tobacco use. She denies any chest pain, chest pressure, source of breath, orthopnea, paroxysmal nocturnal dyspnea or angina. She states that her lupus is still well controlled. She had an upper GI which was within normal limits. Chest x-ray and mammogram are negative. Hemoglobin A1c was 6. Other preoperative labs were unremarkable.   11/2017 She is referred by Dr Kim for evaluation of weight loss surgery. She attended our seminar in person. She is interested in sleeve gastrectomy. She is not interested in having intestinal rerouting. She is trying to get her weight under control as well as ultimately have right hip surgery. Despite numerous attempts for sustained weight loss she's been unsuccessful. She has tried Weight Watchers, low-carb dieting, calorie tracking all without any long-term success. She states that she really enjoys physical activity and walking and exercising but due to her right hip pain this limits her physical activity.  Her comorbidities include systemic lupus, prediabetes, osteoarthritis right hip  She denies any chest pain, chest pressure, source of breath, orthopnea, paroxysmal nocturnal dyspnea, peripheral edema, personal family history of blood clots. She denies any dyspnea on exertion. Her stop bang score was low. She denies any heartburn  issues or indigestion or reflux. She may have one or 2 episodes of heartburn a year. She denies any melena, hematochezia, nausea or vomiting. She is a daily bowel movement. She has had a C-section and total hysterectomy. No other abdominal surgery. She has had her left hip replaced. She now has chronic right hip pain. She will get migraines from MSG. She denies any tobacco or drug use. She drinks alcohol on a very rare occasion. She denies any TIAs or amaurosis fugax. 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   Problem List/Past Medical (Genessis [facility] M. Julia Rehmann, MD; 03/17/2018 3:23 PM) FATIGUE, UNSPECIFIED TYPE (R53.83) SYSTEMIC LUPUS ERYTHEMATOSUS, UNSPECIFIED SLE TYPE, UNSPECIFIED ORGAN INVOLVEMENT STATUS (M32.9) SEVERE OBESITY (E66.01) PREDIABETES (R73.03)  Past Surgical History (Omran Keelin M. Delta Pichon, MD; 03/17/2018 3:23 PM) Hip Surgery Left. Hysterectomy (not due to cancer) - Partial  Diagnostic Studies History (Julia Monroe M. Julia Raker, MD; 03/17/2018 3:23 PM) Colonoscopy never Mammogram 1-3 years ago Pap Smear 1-5 years ago  Allergies (Julia Monroe, CMA; 03/17/2018 3:06 PM) No Known Drug Allergies [12/16/2017]: Allergies Reconciled  Medication History (Julia Monroe, CMA; 03/17/2018 3:06 PM) traMADol HCl (50MG Tablet, Oral) Active. Hydroxychloroquine Sulfate (200MG Tablet, Oral) Active. Diclofenac Sodium (1% Gel, Transdermal) Active. Medications Reconciled  Social History (Aleah Ahlgrim M. Qualyn Oyervides, MD; 03/17/2018 3:23 PM) Alcohol use Occasional alcohol use. Caffeine use Carbonated beverages. No drug use Tobacco use Never smoker.  Family History (Julia Monroe M. Julia Daw, MD; 03/17/2018 3:23 PM) Arthritis Brother, Family Members In General, Mother, Sister.  Pregnancy / Birth History (Julia Monroe M. Julia Trzcinski, MD; 03/17/2018 3:23 PM) Age at menarche 16 years. Gravida 1 Maternal age 26-30 Para 1  Other Problems (Julia Monroe M. Normagene Harvie, MD; 03/17/2018 3:23 PM) Oophorectomy Right. ARTHRITIS OF  RIGHT HIP (M16.11)       Review of Systems (Julia Monroe M. Julia Eick MD; 03/17/2018 3:22 PM) All other systems negative  Vitals (Julia Monroe CMA; 03/17/2018 3:07 PM) 03/17/2018 3:06 PM Weight: 355.25 lb Height: 70.5in Waist: 57.5in Body Surface Area: 2.68 m Body Mass Index: 50.25 kg/m  Temp.: 97.2F(Temporal)  Pulse: 96 (Regular)  P.OX: 97% (Room air) BP: 126/60 (Sitting, Left Arm, Standard)      Physical Exam (Camilo Mander M. Malayiah Mcbrayer MD; 03/17/2018 3:21 PM)  General Mental Status-Alert. General Appearance-Consistent with stated age. Hydration-Well hydrated. Voice-Normal. Note: severe obesity; evenly distributed  Head and Neck Head-normocephalic, atraumatic with no lesions or palpable masses. Trachea-midline. Thyroid Gland Characteristics - normal size and consistency.  Eye Eyeball - Bilateral-Extraocular movements intact. Sclera/Conjunctiva - Bilateral-No scleral icterus.  ENMT Ears -Note:nml ext ears.  Mouth and Throat -Note:lips intact.   Chest and Lung Exam Chest and lung exam reveals -quiet, even and easy respiratory effort with no use of accessory muscles and on auscultation, normal breath sounds, no adventitious sounds and normal vocal resonance. Inspection Chest Wall - Normal. Back - normal.  Breast - Did not examine.  Cardiovascular Cardiovascular examination reveals -normal heart sounds, regular rate and rhythm with no murmurs and normal pedal pulses bilaterally.  Abdomen Inspection Inspection of the abdomen reveals - No Hernias. Skin - Scar - Note: old c/s scar old trocar sites. Palpation/Percussion Palpation and Percussion of the abdomen reveal - Soft, Non Tender, No Rebound tenderness, No Rigidity (guarding) and No hepatosplenomegaly. Auscultation Auscultation of the abdomen reveals - Bowel sounds normal.  Peripheral Vascular Upper Extremity Palpation - Pulses bilaterally normal.  Neurologic Neurologic evaluation  reveals -alert and oriented x 3 with no impairment of recent or remote memory. Mental Status-Normal.  Neuropsychiatric The patient's mood and affect are described as -normal. Judgment and Insight-insight is appropriate concerning matters relevant to self.  Musculoskeletal Normal Exam - Left-Upper Extremity Strength Normal and Lower Extremity Strength Normal. Normal Exam - Right-Upper Extremity Strength Normal and Lower Extremity Strength Normal. Note: b/l knee crepitus  Lymphatic Head & Neck  General Head & Neck Lymphatics: Bilateral - Description - Normal. Axillary - Did not examine. Femoral & Inguinal - Did not examine.    Assessment & Plan (Bunny Lowdermilk M. Nava Song MD; 03/17/2018 3:21 PM)  SEVERE OBESITY (E66.01) Impression: The patient meets weight loss surgery criteria. I think the patient would be an acceptable candidate for Laparoscopic vertical sleeve gastrectomy.  We reviewed her preoperative workup. We rediscussed the typical hospital course as well as the typical recovery. We discussed the typical postoperative issues that were routinely see. She has already had her preoperative education class. We discussed the importance of the preoperative meal plan. 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 We did discuss that this might place her at slightly increased risk for leak but I don't think it would be statistically significant.   PREDIABETES (R73.03)  Bhavin Monjaraz M. Maresa Morash, MD, FACS General, Bariatric, & Minimally Invasive Surgery [facility]   

## 2018-03-20 MED ORDER — BUPIVACAINE LIPOSOME 1.3 % IJ SUSP
20.0000 mL | INTRAMUSCULAR | Status: DC
  Filled 2018-03-20: qty 20

## 2018-03-21 ENCOUNTER — Inpatient Hospital Stay (HOSPITAL_COMMUNITY)
Admission: RE | Admit: 2018-03-21 | Discharge: 2018-03-22 | DRG: 621 | Disposition: A | Discharge status: Home / Self Care | Payer: 59 | Attending: General Surgery | Admitting: General Surgery

## 2018-03-21 ENCOUNTER — Inpatient Hospital Stay (HOSPITAL_COMMUNITY): Payer: 59 | Admitting: Certified Registered Nurse Anesthetist

## 2018-03-21 ENCOUNTER — Encounter (HOSPITAL_COMMUNITY)
Admission: RE | Disposition: A | Discharge status: Home / Self Care | Payer: Self-pay | Source: Home / Self Care | Attending: General Surgery

## 2018-03-21 ENCOUNTER — Encounter (HOSPITAL_COMMUNITY): Payer: Self-pay

## 2018-03-21 ENCOUNTER — Other Ambulatory Visit: Payer: Self-pay

## 2018-03-21 ENCOUNTER — Inpatient Hospital Stay (HOSPITAL_COMMUNITY): Payer: 59 | Admitting: Physician Assistant

## 2018-03-21 DIAGNOSIS — M170 Bilateral primary osteoarthritis of knee: Secondary | ICD-10-CM | POA: Diagnosis present

## 2018-03-21 DIAGNOSIS — G8929 Other chronic pain: Secondary | ICD-10-CM | POA: Diagnosis present

## 2018-03-21 DIAGNOSIS — E739 Lactose intolerance, unspecified: Secondary | ICD-10-CM | POA: Diagnosis present

## 2018-03-21 DIAGNOSIS — C49A2 Gastrointestinal stromal tumor of stomach: Secondary | ICD-10-CM | POA: Diagnosis not present

## 2018-03-21 DIAGNOSIS — Z885 Allergy status to narcotic agent status: Secondary | ICD-10-CM | POA: Diagnosis not present

## 2018-03-21 DIAGNOSIS — M329 Systemic lupus erythematosus, unspecified: Secondary | ICD-10-CM | POA: Diagnosis present

## 2018-03-21 DIAGNOSIS — Z96642 Presence of left artificial hip joint: Secondary | ICD-10-CM | POA: Diagnosis present

## 2018-03-21 DIAGNOSIS — Z91018 Allergy to other foods: Secondary | ICD-10-CM | POA: Diagnosis not present

## 2018-03-21 DIAGNOSIS — Z79899 Other long term (current) drug therapy: Secondary | ICD-10-CM | POA: Diagnosis not present

## 2018-03-21 DIAGNOSIS — R7303 Prediabetes: Secondary | ICD-10-CM | POA: Diagnosis present

## 2018-03-21 DIAGNOSIS — M1611 Unilateral primary osteoarthritis, right hip: Secondary | ICD-10-CM | POA: Diagnosis present

## 2018-03-21 DIAGNOSIS — Z6841 Body Mass Index (BMI) 40.0 and over, adult: Secondary | ICD-10-CM

## 2018-03-21 DIAGNOSIS — Z9884 Bariatric surgery status: Secondary | ICD-10-CM

## 2018-03-21 DIAGNOSIS — R5383 Other fatigue: Secondary | ICD-10-CM | POA: Diagnosis present

## 2018-03-21 DIAGNOSIS — E669 Obesity, unspecified: Secondary | ICD-10-CM | POA: Diagnosis present

## 2018-03-21 HISTORY — PX: LAPAROSCOPIC GASTRIC SLEEVE RESECTION: SHX5895

## 2018-03-21 LAB — ABO/RH: ABO/RH(D): B POS

## 2018-03-21 LAB — TYPE AND SCREEN
ABO/RH(D): B POS
Antibody Screen: NEGATIVE

## 2018-03-21 LAB — HEMOGLOBIN AND HEMATOCRIT, BLOOD
HCT: 41.8 % (ref 36.0–46.0)
Hemoglobin: 12.7 g/dL (ref 12.0–15.0)

## 2018-03-21 SURGERY — GASTRECTOMY, SLEEVE, LAPAROSCOPIC
Anesthesia: General | Site: Abdomen

## 2018-03-21 MED ORDER — ACETAMINOPHEN 500 MG PO TABS
1000.0000 mg | ORAL_TABLET | ORAL | Status: AC
  Administered 2018-03-21: 1000 mg via ORAL
  Filled 2018-03-21: qty 2

## 2018-03-21 MED ORDER — FENTANYL CITRATE (PF) 250 MCG/5ML IJ SOLN
INTRAMUSCULAR | Status: AC
  Filled 2018-03-21: qty 5

## 2018-03-21 MED ORDER — LIDOCAINE 2% (20 MG/ML) 5 ML SYRINGE
INTRAMUSCULAR | Status: DC | PRN
  Administered 2018-03-21: 100 mg via INTRAVENOUS

## 2018-03-21 MED ORDER — SIMETHICONE 80 MG PO CHEW
80.0000 mg | CHEWABLE_TABLET | Freq: Four times a day (QID) | ORAL | Status: DC | PRN
  Administered 2018-03-21 – 2018-03-22 (×2): 80 mg via ORAL
  Filled 2018-03-21 (×2): qty 1

## 2018-03-21 MED ORDER — SUCCINYLCHOLINE CHLORIDE 200 MG/10ML IV SOSY
PREFILLED_SYRINGE | INTRAVENOUS | Status: DC | PRN
  Administered 2018-03-21: 200 mg via INTRAVENOUS

## 2018-03-21 MED ORDER — PROMETHAZINE HCL 25 MG/ML IJ SOLN: 6.2500 mg | INTRAMUSCULAR | Status: DC | PRN

## 2018-03-21 MED ORDER — SUGAMMADEX SODIUM 200 MG/2ML IV SOLN
INTRAVENOUS | Status: DC | PRN
  Administered 2018-03-21: 400 mg via INTRAVENOUS

## 2018-03-21 MED ORDER — PROMETHAZINE HCL 25 MG/ML IJ SOLN: 12.5000 mg | Freq: Four times a day (QID) | INTRAMUSCULAR | Status: DC | PRN

## 2018-03-21 MED ORDER — ENSURE PRE-SURGERY PO LIQD
296.0000 mL | Freq: Once | ORAL | Status: DC
  Filled 2018-03-21: qty 296

## 2018-03-21 MED ORDER — LACTATED RINGERS IR SOLN
Status: DC | PRN
  Administered 2018-03-21: 1000 mL

## 2018-03-21 MED ORDER — GABAPENTIN 300 MG PO CAPS
300.0000 mg | ORAL_CAPSULE | ORAL | Status: AC
  Administered 2018-03-21: 300 mg via ORAL
  Filled 2018-03-21: qty 1

## 2018-03-21 MED ORDER — ONDANSETRON HCL 4 MG/2ML IJ SOLN
4.0000 mg | Freq: Four times a day (QID) | INTRAMUSCULAR | Status: DC | PRN
  Administered 2018-03-22 (×2): 4 mg via INTRAVENOUS
  Filled 2018-03-21 (×2): qty 2

## 2018-03-21 MED ORDER — KETAMINE HCL 10 MG/ML IJ SOLN
INTRAMUSCULAR | Status: DC | PRN
  Administered 2018-03-21: 30 mg via INTRAVENOUS

## 2018-03-21 MED ORDER — SUCCINYLCHOLINE CHLORIDE 200 MG/10ML IV SOSY
PREFILLED_SYRINGE | INTRAVENOUS | Status: AC
  Filled 2018-03-21: qty 10

## 2018-03-21 MED ORDER — SODIUM CHLORIDE 0.9 % IV SOLN
2.0000 g | INTRAVENOUS | Status: AC
  Administered 2018-03-21: 2 g via INTRAVENOUS
  Filled 2018-03-21: qty 2

## 2018-03-21 MED ORDER — PHENYLEPHRINE HCL 10 MG/ML IJ SOLN
INTRAVENOUS | Status: DC | PRN
  Administered 2018-03-21: 25 ug/min via INTRAVENOUS

## 2018-03-21 MED ORDER — MEPERIDINE HCL 50 MG/ML IJ SOLN: 6.2500 mg | INTRAMUSCULAR | Status: DC | PRN

## 2018-03-21 MED ORDER — BUPIVACAINE LIPOSOME 1.3 % IJ SUSP
INTRAMUSCULAR | Status: DC | PRN
  Administered 2018-03-21: 20 mL

## 2018-03-21 MED ORDER — LIDOCAINE 2% (20 MG/ML) 5 ML SYRINGE
INTRAMUSCULAR | Status: AC
  Filled 2018-03-21: qty 5

## 2018-03-21 MED ORDER — GABAPENTIN 100 MG PO CAPS
200.0000 mg | ORAL_CAPSULE | Freq: Two times a day (BID) | ORAL | Status: DC
  Administered 2018-03-21 – 2018-03-22 (×3): 200 mg via ORAL
  Filled 2018-03-21 (×3): qty 2

## 2018-03-21 MED ORDER — SCOPOLAMINE 1 MG/3DAYS TD PT72
1.0000 | MEDICATED_PATCH | TRANSDERMAL | Status: DC
  Administered 2018-03-21: 1.5 mg via TRANSDERMAL
  Filled 2018-03-21: qty 1

## 2018-03-21 MED ORDER — 0.9 % SODIUM CHLORIDE (POUR BTL) OPTIME
TOPICAL | Status: DC | PRN
  Administered 2018-03-21: 1000 mL

## 2018-03-21 MED ORDER — HEPARIN SODIUM (PORCINE) 5000 UNIT/ML IJ SOLN
5000.0000 [IU] | INTRAMUSCULAR | Status: AC
  Administered 2018-03-21: 5000 [IU] via SUBCUTANEOUS
  Filled 2018-03-21: qty 1

## 2018-03-21 MED ORDER — PROPOFOL 10 MG/ML IV BOLUS
INTRAVENOUS | Status: DC | PRN
  Administered 2018-03-21: 200 mg via INTRAVENOUS

## 2018-03-21 MED ORDER — LIDOCAINE 2% (20 MG/ML) 5 ML SYRINGE
INTRAMUSCULAR | Status: DC | PRN
  Administered 2018-03-21: 1.5 mg/kg/h via INTRAVENOUS

## 2018-03-21 MED ORDER — ENOXAPARIN SODIUM 30 MG/0.3ML ~~LOC~~ SOLN
30.0000 mg | Freq: Two times a day (BID) | SUBCUTANEOUS | Status: DC
  Administered 2018-03-21 – 2018-03-22 (×2): 30 mg via SUBCUTANEOUS
  Filled 2018-03-21 (×2): qty 0.3

## 2018-03-21 MED ORDER — OXYCODONE HCL 5 MG/5ML PO SOLN: 5.0000 mg | ORAL | Status: DC | PRN

## 2018-03-21 MED ORDER — ROCURONIUM BROMIDE 50 MG/5ML IV SOSY
PREFILLED_SYRINGE | INTRAVENOUS | Status: DC | PRN
  Administered 2018-03-21: 70 mg via INTRAVENOUS
  Administered 2018-03-21: 10 mg via INTRAVENOUS

## 2018-03-21 MED ORDER — DEXAMETHASONE SODIUM PHOSPHATE 4 MG/ML IJ SOLN: 4.0000 mg | INTRAMUSCULAR | Status: DC

## 2018-03-21 MED ORDER — ACETAMINOPHEN 160 MG/5ML PO SOLN
650.0000 mg | Freq: Four times a day (QID) | ORAL | Status: DC
  Administered 2018-03-21 – 2018-03-22 (×4): 650 mg via ORAL
  Filled 2018-03-21 (×4): qty 20.3

## 2018-03-21 MED ORDER — APREPITANT 40 MG PO CAPS
40.0000 mg | ORAL_CAPSULE | ORAL | Status: AC
  Administered 2018-03-21: 40 mg via ORAL
  Filled 2018-03-21: qty 1

## 2018-03-21 MED ORDER — PHENYLEPHRINE 40 MCG/ML (10ML) SYRINGE FOR IV PUSH (FOR BLOOD PRESSURE SUPPORT)
PREFILLED_SYRINGE | INTRAVENOUS | Status: DC | PRN
  Administered 2018-03-21 (×3): 80 ug via INTRAVENOUS

## 2018-03-21 MED ORDER — STERILE WATER FOR IRRIGATION IR SOLN
Status: DC | PRN
  Administered 2018-03-21: 2000 mL

## 2018-03-21 MED ORDER — ROCURONIUM BROMIDE 100 MG/10ML IV SOLN
INTRAVENOUS | Status: AC
  Filled 2018-03-21: qty 1

## 2018-03-21 MED ORDER — HYDROMORPHONE HCL 1 MG/ML IJ SOLN
INTRAMUSCULAR | Status: AC
  Filled 2018-03-21: qty 2

## 2018-03-21 MED ORDER — HYDROMORPHONE HCL 1 MG/ML IJ SOLN
0.2500 mg | INTRAMUSCULAR | Status: DC | PRN
  Administered 2018-03-21 (×4): 0.5 mg via INTRAVENOUS

## 2018-03-21 MED ORDER — MIDAZOLAM HCL 2 MG/2ML IJ SOLN: 0.5000 mg | Freq: Once | INTRAMUSCULAR | Status: DC | PRN

## 2018-03-21 MED ORDER — ENSURE MAX PROTEIN PO LIQD
2.0000 [oz_av] | ORAL | Status: DC
  Administered 2018-03-22: 2 [oz_av] via ORAL

## 2018-03-21 MED ORDER — DEXAMETHASONE SODIUM PHOSPHATE 10 MG/ML IJ SOLN
INTRAMUSCULAR | Status: DC | PRN
  Administered 2018-03-21: 6 mg via INTRAVENOUS

## 2018-03-21 MED ORDER — ONDANSETRON HCL 4 MG/2ML IJ SOLN
INTRAMUSCULAR | Status: AC
  Filled 2018-03-21: qty 2

## 2018-03-21 MED ORDER — ONDANSETRON HCL 4 MG/2ML IJ SOLN
INTRAMUSCULAR | Status: DC | PRN
  Administered 2018-03-21: 4 mg via INTRAVENOUS

## 2018-03-21 MED ORDER — SODIUM CHLORIDE (PF) 0.9 % IJ SOLN
INTRAMUSCULAR | Status: DC | PRN
  Administered 2018-03-21: 50 mL

## 2018-03-21 MED ORDER — DEXAMETHASONE SODIUM PHOSPHATE 10 MG/ML IJ SOLN
INTRAMUSCULAR | Status: AC
  Filled 2018-03-21: qty 1

## 2018-03-21 MED ORDER — LACTATED RINGERS IV SOLN
INTRAVENOUS | Status: DC
  Administered 2018-03-21: 08:00:00 via INTRAVENOUS

## 2018-03-21 MED ORDER — MIDAZOLAM HCL 2 MG/2ML IJ SOLN
INTRAMUSCULAR | Status: AC
  Filled 2018-03-21: qty 2

## 2018-03-21 MED ORDER — KCL IN DEXTROSE-NACL 20-5-0.45 MEQ/L-%-% IV SOLN
INTRAVENOUS | Status: DC
  Administered 2018-03-21 – 2018-03-22 (×2): via INTRAVENOUS
  Filled 2018-03-21 (×3): qty 1000

## 2018-03-21 MED ORDER — PROPOFOL 10 MG/ML IV BOLUS
INTRAVENOUS | Status: AC
  Filled 2018-03-21: qty 40

## 2018-03-21 MED ORDER — FENTANYL CITRATE (PF) 250 MCG/5ML IJ SOLN
INTRAMUSCULAR | Status: DC | PRN
  Administered 2018-03-21: 150 ug via INTRAVENOUS
  Administered 2018-03-21: 100 ug via INTRAVENOUS

## 2018-03-21 MED ORDER — CHLORHEXIDINE GLUCONATE 4 % EX LIQD: 60.0000 mL | Freq: Once | CUTANEOUS | Status: DC

## 2018-03-21 MED ORDER — MORPHINE SULFATE (PF) 2 MG/ML IV SOLN
1.0000 mg | INTRAVENOUS | Status: DC | PRN
  Administered 2018-03-21 (×2): 2 mg via INTRAVENOUS
  Filled 2018-03-21 (×2): qty 1

## 2018-03-21 MED ORDER — TRAMADOL HCL 50 MG PO TABS
50.0000 mg | ORAL_TABLET | Freq: Four times a day (QID) | ORAL | Status: DC | PRN
  Administered 2018-03-21 – 2018-03-22 (×2): 50 mg via ORAL
  Filled 2018-03-21 (×2): qty 1

## 2018-03-21 MED ORDER — PANTOPRAZOLE SODIUM 40 MG IV SOLR
40.0000 mg | Freq: Every day | INTRAVENOUS | Status: DC
  Administered 2018-03-21: 40 mg via INTRAVENOUS
  Filled 2018-03-21: qty 40

## 2018-03-21 MED ORDER — MIDAZOLAM HCL 5 MG/5ML IJ SOLN
INTRAMUSCULAR | Status: DC | PRN
  Administered 2018-03-21: 2 mg via INTRAVENOUS

## 2018-03-21 MED ORDER — DIPHENHYDRAMINE HCL 50 MG/ML IJ SOLN: 12.5000 mg | Freq: Three times a day (TID) | INTRAMUSCULAR | Status: DC | PRN

## 2018-03-21 SURGICAL SUPPLY — 76 items
APL SKNCLS STERI-STRIP NONHPOA (GAUZE/BANDAGES/DRESSINGS) ×1
APL SRG 32X5 SNPLK LF DISP (MISCELLANEOUS)
APL SWBSTK 6 STRL LF DISP (MISCELLANEOUS) ×1
APPLICATOR COTTON TIP 6 STRL (MISCELLANEOUS) IMPLANT
APPLICATOR COTTON TIP 6IN STRL (MISCELLANEOUS) ×2
APPLIER CLIP ROT 10 11.4 M/L (STAPLE)
APPLIER CLIP ROT 13.4 12 LRG (CLIP)
APR CLP LRG 13.4X12 ROT 20 MLT (CLIP)
APR CLP MED LRG 11.4X10 (STAPLE)
BANDAGE ADH SHEER 1  50/CT (GAUZE/BANDAGES/DRESSINGS) ×12 IMPLANT
BENZOIN TINCTURE PRP APPL 2/3 (GAUZE/BANDAGES/DRESSINGS) ×2 IMPLANT
BLADE SURG SZ11 CARB STEEL (BLADE) ×2 IMPLANT
CABLE HIGH FREQUENCY MONO STRZ (ELECTRODE) ×2 IMPLANT
CHLORAPREP W/TINT 26ML (MISCELLANEOUS) ×4 IMPLANT
CLIP APPLIE ROT 10 11.4 M/L (STAPLE) IMPLANT
CLIP APPLIE ROT 13.4 12 LRG (CLIP) IMPLANT
COVER SURGICAL LIGHT HANDLE (MISCELLANEOUS) ×2 IMPLANT
COVER WAND RF STERILE (DRAPES) ×1 IMPLANT
DECANTER SPIKE VIAL GLASS SM (MISCELLANEOUS) ×2 IMPLANT
DEVICE SUT QUICK LOAD TK 5 (STAPLE) IMPLANT
DEVICE SUT TI-KNOT TK 5X26 (MISCELLANEOUS) IMPLANT
DEVICE SUTURE ENDOST 10MM (ENDOMECHANICALS) IMPLANT
DISSECTOR BLUNT TIP ENDO 5MM (MISCELLANEOUS) IMPLANT
DRAPE UTILITY XL STRL (DRAPES) ×4 IMPLANT
ELECT L-HOOK LAP 45CM DISP (ELECTROSURGICAL)
ELECT PENCIL ROCKER SW 15FT (MISCELLANEOUS) IMPLANT
ELECT REM PT RETURN 15FT ADLT (MISCELLANEOUS) ×2 IMPLANT
ELECTRODE L-HOOK LAP 45CM DISP (ELECTROSURGICAL) IMPLANT
GAUZE SPONGE 2X2 8PLY STRL LF (GAUZE/BANDAGES/DRESSINGS) IMPLANT
GAUZE SPONGE 4X4 12PLY STRL (GAUZE/BANDAGES/DRESSINGS) IMPLANT
GLOVE BIO SURGEON STRL SZ7.5 (GLOVE) ×2 IMPLANT
GLOVE INDICATOR 8.0 STRL GRN (GLOVE) ×2 IMPLANT
GOWN STRL REUS W/TWL XL LVL3 (GOWN DISPOSABLE) ×6 IMPLANT
GRASPER SUT TROCAR 14GX15 (MISCELLANEOUS) ×2 IMPLANT
HOVERMATT SINGLE USE (MISCELLANEOUS) ×2 IMPLANT
KIT BASIN OR (CUSTOM PROCEDURE TRAY) ×2 IMPLANT
MARKER SKIN DUAL TIP RULER LAB (MISCELLANEOUS) ×2 IMPLANT
NDL SPNL 22GX3.5 QUINCKE BK (NEEDLE) ×1 IMPLANT
NEEDLE SPNL 22GX3.5 QUINCKE BK (NEEDLE) ×2 IMPLANT
PACK UNIVERSAL I (CUSTOM PROCEDURE TRAY) ×2 IMPLANT
RELOAD STAPLE 60 3.6 BLU REG (STAPLE) ×1 IMPLANT
RELOAD STAPLE 60 3.8 GOLD REG (STAPLE) IMPLANT
RELOAD STAPLE 60 4.1 GRN THCK (STAPLE) ×1 IMPLANT
RELOAD STAPLE 60 BLK VRY/THCK (STAPLE) IMPLANT
RELOAD STAPLER 60MM BLK (STAPLE) ×1 IMPLANT
RELOAD STAPLER BLUE 60MM (STAPLE) ×3 IMPLANT
RELOAD STAPLER GOLD 60MM (STAPLE) IMPLANT
RELOAD STAPLER GREEN 60MM (STAPLE) ×1 IMPLANT
SCISSORS LAP 5X45 EPIX DISP (ENDOMECHANICALS) IMPLANT
SEALANT SURGICAL APPL DUAL CAN (MISCELLANEOUS) IMPLANT
SET IRRIG TUBING LAPAROSCOPIC (IRRIGATION / IRRIGATOR) ×2 IMPLANT
SET TUBE SMOKE EVAC HIGH FLOW (TUBING) ×2 IMPLANT
SHEARS HARMONIC ACE PLUS 45CM (MISCELLANEOUS) ×2 IMPLANT
SLEEVE GASTRECTOMY 40FR VISIGI (MISCELLANEOUS) ×2 IMPLANT
SLEEVE XCEL OPT CAN 5 100 (ENDOMECHANICALS) ×6 IMPLANT
SOLUTION ANTI FOG 6CC (MISCELLANEOUS) ×2 IMPLANT
SPONGE GAUZE 2X2 STER 10/PKG (GAUZE/BANDAGES/DRESSINGS)
SPONGE LAP 18X18 RF (DISPOSABLE) ×2 IMPLANT
STAPLER ECHELON BIOABSB 60 FLE (MISCELLANEOUS) ×9 IMPLANT
STAPLER ECHELON LONG 60 440 (INSTRUMENTS) ×2 IMPLANT
STAPLER RELOAD 60MM BLK (STAPLE) ×2
STAPLER RELOAD BLUE 60MM (STAPLE) ×6
STAPLER RELOAD GOLD 60MM (STAPLE)
STAPLER RELOAD GREEN 60MM (STAPLE) ×2
STRIP CLOSURE SKIN 1/2X4 (GAUZE/BANDAGES/DRESSINGS) ×2 IMPLANT
SUT MNCRL AB 4-0 PS2 18 (SUTURE) ×2 IMPLANT
SUT SURGIDAC NAB ES-9 0 48 120 (SUTURE) IMPLANT
SUT VICRYL 0 TIES 12 18 (SUTURE) ×2 IMPLANT
SYR 20CC LL (SYRINGE) ×2 IMPLANT
SYR 50ML LL SCALE MARK (SYRINGE) ×2 IMPLANT
TOWEL OR 17X26 10 PK STRL BLUE (TOWEL DISPOSABLE) ×2 IMPLANT
TOWEL OR NON WOVEN STRL DISP B (DISPOSABLE) ×2 IMPLANT
TROCAR BLADELESS 15MM (ENDOMECHANICALS) ×2 IMPLANT
TROCAR BLADELESS OPT 5 100 (ENDOMECHANICALS) ×2 IMPLANT
TUBING CONNECTING 10 (TUBING) ×4 IMPLANT
TUBING ENDO SMARTCAP (MISCELLANEOUS) ×2 IMPLANT

## 2018-03-21 NOTE — Anesthesia Postprocedure Evaluation (Signed)
Anesthesia Post Note  Patient: Julia Monroe  Procedure(s) Performed: LAPAROSCOPIC GASTRIC SLEEVE RESECTION, UPPER ENDO, ERAS Pathway (N/A Abdomen)     Patient location during evaluation: PACU Anesthesia Type: General Level of consciousness: awake and alert, patient cooperative and oriented Pain management: pain level controlled Vital Signs Assessment: post-procedure vital signs reviewed and stable Respiratory status: spontaneous breathing, nonlabored ventilation, respiratory function stable and patient connected to nasal cannula oxygen Cardiovascular status: blood pressure returned to baseline and stable Postop Assessment: no apparent nausea or vomiting Anesthetic complications: no    Last Vitals:  Vitals:   03/21/18 1215 03/21/18 1230  BP: 137/81 (!) 143/86  Pulse: 90 90  Resp: 16 19  Temp:  36.6 C  SpO2: 98% 100%    Last Pain:  Vitals:   03/21/18 1230  TempSrc:   PainSc: 4                  [name],E. Chavela Justiniano

## 2018-03-21 NOTE — Discharge Instructions (Signed)
° ° ° °GASTRIC BYPASS/SLEEVE ° Home Care Instructions ° ° These instructions are to help you care for yourself when you go home. ° °Call: If you have any problems. °• Call [phone] and ask for the surgeon on call °• If you need immediate help, come to the ER at [facility].  °• Tell the ER staff that you are a new post-op gastric bypass or gastric sleeve patient °  °Signs and symptoms to report: • Severe vomiting or nausea °o If you cannot keep down clear liquids for longer than 1 day, call your surgeon  °• Abdominal pain that does not get better after taking your pain medication °• Fever over 100.4° F with chills °• Heart beating over 100 beats a minute °• Shortness of breath at rest °• Chest pain °•  Redness, swelling, drainage, or foul odor at incision (surgical) sites °•  If your incisions open or pull apart °• Swelling or pain in calf (lower leg) °• Diarrhea (Loose bowel movements that happen often), frequent watery, uncontrolled bowel movements °• Constipation, (no bowel movements for 3 days) if this happens: Pick one °o Milk of Magnesia, 2 tablespoons by mouth, 3 times a day for 2 days if needed °o Stop taking Milk of Magnesia once you have a bowel movement °o Call your doctor if constipation continues °Or °o Miralax  (instead of Milk of Magnesia) following the label instructions °o Stop taking Miralax once you have a bowel movement °o Call your doctor if constipation continues °• Anything you think is not normal °  °Normal side effects after surgery: • Unable to sleep at night or unable to focus °• Irritability or moody °• Being tearful (crying) or depressed °These are common complaints, possibly related to your anesthesia medications that put you to sleep, stress of surgery, and change in lifestyle.  This usually goes away a few weeks after surgery.  If these feelings continue, call your primary care doctor. °  °Wound Care: You may have surgical glue, steri-strips, or staples over your incisions after  surgery °• Surgical glue:  Looks like a clear film over your incisions and will wear off a little at a time °• Steri-strips: Strips of tape over your incisions. You may notice a yellowish color on the skin under the steri-strips. This is used to make the   steri-strips stick better. Do not pull the steri-strips off - let them fall off °• Staples: Staples may be removed before you leave the hospital °o If you go home with staples, call [facility], [phone] at for an appointment with your surgeon’s nurse to have staples removed 10 days after surgery. °• Showering: You may shower two (2) days after your surgery unless your surgeon tells you differently °o Wash gently around incisions with warm soapy water, rinse well, and gently pat dry  °o No tub baths until staples are removed, steri-strips fall off or glue is gone.  °  °Medications: • Medications should be liquid or crushed if larger than the size of a dime °• Extended release pills (medication that release a little bit at a time through the day) should NOT be crushed or cut. (examples include XL, ER, DR, SR) °• Depending on the size and number of medications you take, you may need to space (take a few throughout the day)/change the time you take your medications so that you do not over-fill your pouch (smaller stomach) °• Make sure you follow-up with your primary care doctor to   make medication changes needed during rapid weight loss and life-style changes °• If you have diabetes, follow up with the doctor that orders your diabetes medication(s) within one week after surgery and check your blood sugar regularly. °• Do not drive while taking prescription pain medication  °• It is ok to take Tylenol by the bottle instructions with your pain medicine or instead of your pain medicine as needed.  DO NOT TAKE NSAIDS (EXAMPLES OF NSAIDS:  IBUPROFREN/ NAPROXEN)  °Diet:                    First 2 Weeks ° You will see the dietician t about two (2) weeks  after your surgery. The dietician will increase the types of foods you can eat if you are handling liquids well: °• If you have severe vomiting or nausea and cannot keep down clear liquids lasting longer than 1 day, call your surgeon @ ([phone]) °Protein Shake °• Drink at least 2 ounces of shake 5-6 times per day °• Each serving of protein shakes (usually 8 - 12 ounces) should have: °o 15 grams of protein  °o And no more than 5 grams of carbohydrate  °• Goal for protein each day: °o Men = 80 grams per day °o Women = 60 grams per day °• Protein powder may be added to fluids such as non-fat milk or Lactaid milk or unsweetened Soy/Almond milk (limit to 35 grams added protein powder per serving) ° °Hydration °• Slowly increase the amount of water and other clear liquids as tolerated (See Acceptable Fluids) °• Slowly increase the amount of protein shake as tolerated  °•  Sip fluids slowly and throughout the day.  Do not use straws. °• May use sugar substitutes in small amounts (no more than 6 - 8 packets per day; i.e. Splenda) ° °Fluid Goal °• The first goal is to drink at least 8 ounces of protein shake/drink per day (or as directed by the nutritionist); some examples of protein shakes are Syntrax Nectar, Adkins Advantage, EAS Edge HP, and Unjury. See handout from pre-op Bariatric Education Class: °o Slowly increase the amount of protein shake you drink as tolerated °o You may find it easier to slowly sip shakes throughout the day °o It is important to get your proteins in first °• Your fluid goal is to drink 64 - 100 ounces of fluid daily °o It may take a few weeks to build up to this °• 32 oz (or more) should be clear liquids  °And  °• 32 oz (or more) should be full liquids (see below for examples) °• Liquids should not contain sugar, caffeine, or carbonation ° °Clear Liquids: °• Water or Sugar-free flavored water (i.e. Fruit H2O, Propel) °• Decaffeinated coffee or tea (sugar-free) °• Crystal Lite, Wyler’s Lite,  Minute Maid Lite °• Sugar-free Jell-O °• Bouillon or broth °• Sugar-free Popsicle:   *Less than 20 calories each; Limit 1 per day ° °Full Liquids: °Protein Shakes/Drinks + 2 choices per day of other full liquids °• Full liquids must be: °o No More Than 15 grams of Carbs per serving  °o No More Than 3 grams of Fat per serving °• Strained low-fat cream soup (except Cream of Potato or Tomato) °• Non-Fat milk °• Fat-free Lactaid Milk °• Unsweetened Soy Or Unsweetened Almond Milk °• Low Sugar yogurt (Dannon Lite & Fit, Greek yogurt; Oikos Triple Zero; Chobani Simply 100; Yoplait 100 calorie Greek - No Fruit on the Bottom) ° °  °Vitamins   and Minerals • Start 1 day after surgery unless otherwise directed by your surgeon °• 2 Chewable Bariatric Specific Multivitamin / Multimineral Supplement with iron (Example: Bariatric Advantage Multi EA) °• Chewable Calcium with Vitamin D-3 °(Example: 3 Chewable Calcium Plus 600 with Vitamin D-3) °o Take 500 mg three (3) times a day for a total of 1500 mg each day °o Do not take all 3 doses of calcium at one time as it may cause constipation, and you can only absorb 500 mg  at a time  °o Do not mix multivitamins containing iron with calcium supplements; take 2 hours apart °• Menstruating women and those with a history of anemia (a blood disease that causes weakness) may need extra iron °o Talk with your doctor to see if you need more iron °• Do not stop taking or change any vitamins or minerals until you talk to your dietitian or surgeon °• Your Dietitian and/or surgeon must approve all vitamin and mineral supplements °  °Activity and Exercise: Limit your physical activity as instructed by your doctor.  It is important to continue walking at home.  During this time, use these guidelines: °• Do not lift anything greater than ten (10) pounds for at least two (2) weeks °• Do not go back to work or drive until your surgeon says you can °• You may have sex when you feel comfortable  °o It is  VERY important for female patients to use a reliable birth control method; fertility often increases after surgery  °o All hormonal birth control will be ineffective for 30 days after surgery due to medications given during surgery a barrier method must be used. °o Do not get pregnant for at least 18 months °• Start exercising as soon as your doctor tells you that you can °o Make sure your doctor approves any physical activity °• Start with a simple walking program °• Walk 5-15 minutes each day, 7 days per week.  °• Slowly increase until you are walking 30-45 minutes per day °Consider joining our BELT program. [phone] or email belt@[employer] °  °Special Instructions Things to remember: °• Use your CPAP when sleeping if this applies to you ° °• [facility] has two free Bariatric Surgery Support Groups that meet monthly °o The 3rd Thursday of each month, 6 pm, [facility]  °o The 2nd Friday of each month, 11:45 am in the private dining room in the basement of [facility] °• It is very important to keep all follow up appointments with your surgeon, dietitian, primary care physician, and behavioral health practitioner °• Routine follow up schedule with your surgeon include appointments at 2-3 weeks, 6-8 weeks, 6 months, and 1 year at a minimum.  Your surgeon may request to see you more often.   °o After the first year, please follow up with your bariatric surgeon and dietitian at least once a year in order to maintain best weight loss results °[facility]: [phone] °[facility]: [phone] °Bariatric Nurse Coordinator: [phone] °  °   Reviewed and Endorsed  °by [facility] Patient Education Committee, June, 2016 °Edits Approved: Aug, 2018 ° ° ° °

## 2018-03-21 NOTE — Progress Notes (Addendum)
PHARMACY CONSULT FOR:  Risk Assessment for Post-Discharge VTE Following Bariatric Surgery  Post-Discharge VTE Risk Assessment: This patient's probability of 30-day post-discharge VTE is increased due to the factors marked:   Female    Age >/=60 years  X  BMI >/=50 kg/m2    CHF    Dyspnea at Rest    Paraplegia   X Non-gastric-band surgery    Operation Time >/=3 hr    Return to OR     Length of Stay >/= 3 d   Predicted probability of 30-day post-discharge VTE: 0.27 %  Other patient-specific factors to consider: none   Recommendation for Discharge: No pharmacologic prophylaxis post-discharge  Julia Monroe is a 48 y.o. female who underwent laparoscopic sleeve gastrectomy 03/21/2018    Allergies  Allergen Reactions  . Monosodium Glutamate Other (See Comments)    Loses vision, headache/migraine  . Lactose Intolerance (Gi) Other (See Comments)    Gi upset  . Caffeine Other (See Comments)    Severe headache   . Hydrocodone Other (See Comments)    Headaches/migraines   . Oxycodone Other (See Comments)    Headache/Migraine    Patient Measurements: Height: 5\' 9"  (175.3 cm) Weight: (!) 349 lb (158.3 kg) IBW/kg (Calculated) : 66.2 Body mass index is 51.54 kg/m.  No results for input(s): WBC, HGB, HCT, PLT, APTT, CREATININE, LABCREA, CREATININE, CREAT24HRUR, MG, PHOS, ALBUMIN, PROT, ALBUMIN, AST, ALT, ALKPHOS, BILITOT, BILIDIR, IBILI in the last 72 hours. Estimated Creatinine Clearance: 144.4 mL/min (by C-G formula based on SCr of 0.61 mg/dL).    Past Medical History:  Diagnosis Date  . Arthritis    osteoarthritis  . Complication of anesthesia    bottom of feet itching  . Elevated hemoglobin A1c 2018   6.1  . Headache(784.0)    hx migraines  . Lupus ([place]) 2018   Dr.Deveshwar     Medications Prior to Admission  Medication Sig Dispense Refill Last Dose  . acetaminophen (TYLENOL) 500 MG tablet Take 1,500 mg by mouth 2 (two) times daily as needed for  moderate pain.    03/17/2018  . Biotin 10000 MCG TBDP Take 10,000 mcg by mouth daily.   03/18/2018  . calcium carbonate (OS-CAL - DOSED IN MG OF ELEMENTAL CALCIUM) 1250 (500 Ca) MG tablet Take 500 mg by mouth 3 (three) times daily.   03/18/2018  . Chlorphen-Phenyleph-ASA (ALKA-SELTZER PLUS COLD PO) Take 2 tablets by mouth daily as needed (allergies).   Past Week at Unknown time  . Cholecalciferol (VITAMIN D) 125 MCG (5000 UT) CAPS Take 5,000 Units by mouth daily.   03/18/2018  . diclofenac sodium (VOLTAREN) 1 % GEL Apply 3 gm to 3 large joints up to 3 times a day.Dispense 3 tubes with 3 refills. (Patient taking differently: Apply 3 g topically 3 (three) times daily as needed (pain). ) 3 Tube 0 Past Month at Unknown time  . IRON PO Take 10 mLs by mouth daily. LIQUID IRON (Ferric Glycinate) 18 mg per 10 ml   Past Month at Unknown time  . Menthol-Methyl Salicylate (MUSCLE RUB) 10-15 % CREA Apply 1 application topically as needed for muscle pain.   Past Month at Unknown time  . Multiple Vitamin (MULTIVITAMIN WITH MINERALS) TABS tablet Take 1 tablet by mouth 2 (two) times daily.   03/18/2018  . Tetrahydrozoline HCl (VISINE OP) Place 1 drop into both eyes 3 (three) times daily as needed (for irritated/dry eyes.).   03/21/2018 at 0500  . traMADol (ULTRAM) 50 MG tablet Take  2 tablets (100 mg total) by mouth every 6 (six) hours as needed for moderate pain. 30 tablet 0 03/18/2018  . vitamin E 400 UNIT capsule Take 400 Units by mouth daily.   03/18/2018  . CAMBIA 50 MG PACK Take 50 mg by mouth daily as needed (migraine).   0 More than a month at Unknown time  . hydroxychloroquine (PLAQUENIL) 200 MG tablet Take 1 tablet (200 mg total) by mouth 2 (two) times daily. (Patient not taking: Reported on 03/15/2018) 60 tablet 2 More than a month at Unknown time      Eudelia Bunch, Pharm.D [phone] 03/21/2018 11:53 AM

## 2018-03-21 NOTE — Op Note (Signed)
Preoperative diagnosis: laparoscopic sleeve gastrectomy  Postoperative diagnosis: Same   Procedure: Upper endoscopy   Surgeon: Gurney Maxin, M.D.  Anesthesia: Gen.   Indications for procedure: This patient was undergoing a laparoscopic sleeve gastrectomy.   Description of procedure: The endoscopy was placed in the mouth and into the oropharynx and under endoscopic vision it was advanced to the esophagogastric junction. The pouch was insufflated and no bleeding or bubbles were seen. The GEJ was identified at 39cm from the teeth. No bleeding or leaks were detected. The scope was withdrawn without difficulty.   Gurney Maxin, M.D. General, Bariatric, & Minimally Invasive Surgery [facility]

## 2018-03-21 NOTE — Transfer of Care (Signed)
Immediate Anesthesia Transfer of Care Note  Patient: Julia Monroe  Procedure(s) Performed: LAPAROSCOPIC GASTRIC SLEEVE RESECTION, UPPER ENDO, ERAS Pathway (N/A Abdomen)  Patient Location: PACU  Anesthesia Type:General  Level of Consciousness: awake, alert  and oriented  Airway & Oxygen Therapy: Patient Spontanous Breathing and Patient connected to face mask oxygen  Post-op Assessment: Report given to RN and Post -op Vital signs reviewed and stable  Post vital signs: Reviewed and stable  Last Vitals:  Vitals Value Taken Time  BP 137/82 03/21/2018 11:42 AM  Temp    Pulse 88 03/21/2018 11:44 AM  Resp 22 03/21/2018 11:44 AM  SpO2 100 % 03/21/2018 11:44 AM  Vitals shown include unvalidated device data.  Last Pain:  Vitals:   03/21/18 0759  TempSrc:   PainSc: 0-No pain         Complications: No apparent anesthesia complications

## 2018-03-21 NOTE — Anesthesia Procedure Notes (Signed)
Procedure Name: Intubation Date/Time: 03/21/2018 9:44 AM Performed by: Maxwell Caul, CRNA Pre-anesthesia Checklist: Patient identified, Emergency Drugs available, Suction available and Patient being monitored Patient Re-evaluated:Patient Re-evaluated prior to induction Oxygen Delivery Method: Circle system utilized Preoxygenation: Pre-oxygenation with 100% oxygen Induction Type: IV induction, Rapid sequence and Cricoid Pressure applied Laryngoscope Size: Mac and 4 Grade View: Grade I Tube type: Oral Tube size: 7.5 mm Number of attempts: 1 Airway Equipment and Method: Stylet Placement Confirmation: ETT inserted through vocal cords under direct vision,  positive ETCO2 and breath sounds checked- equal and bilateral Secured at: 22 cm Tube secured with: Tape Dental Injury: Teeth and Oropharynx as per pre-operative assessment

## 2018-03-21 NOTE — Op Note (Signed)
03/21/2018 Julia Monroe 17-Nov-1970 [id number]   PRE-OPERATIVE DIAGNOSIS:  Principal Problem:   Class 3 severe obesity body mass index (BMI) of 51   Primary osteoarthritis of right hip   Primary osteoarthritis of both knees   Prediabetes   SLE (systemic lupus erythematosus) (HCC)   POST-OPERATIVE DIAGNOSIS:  Same + gastric submucosal mass (in specimen)  PROCEDURE:  Procedure(s): LAPAROSCOPIC SLEEVE GASTRECTOMY  UPPER GI ENDOSCOPY  SURGEON:  Surgeon(s): Gayland Curry, MD FACS FASMBS  ASSISTANTS: Gurney Maxin MD FACS; Carlena Hurl PA-C  ANESTHESIA:   general  DRAINS: none   BOUGIE: 40 fr ViSiGi  LOCAL MEDICATIONS USED:   Exparel  EBL: minimal  SPECIMEN:  Source of Specimen:  Greater curvature of stomach  DISPOSITION OF SPECIMEN:  PATHOLOGY  COUNTS:  YES  INDICATION FOR PROCEDURE: This is a very pleasant 48 y.o.-year-old morbidly obese female who has had unsuccessful attempts for sustained weight loss. The patient presents today for a planned laparoscopic sleeve gastrectomy with upper endoscopy. We have discussed the risk and benefits of the procedure extensively preoperatively. Please see my separate notes.  PROCEDURE: After obtaining informed consent and receiving 5000 units of subcutaneous heparin, the patient was brought to the operating room at [facility] and placed supine on the operating room table. General endotracheal anesthesia was established. Sequential compression devices were placed. A orogastric tube was placed. The patient's abdomen was prepped and draped in the usual standard surgical fashion. The patient received preoperative IV antibiotic. A surgical timeout was performed. ERAS protocol used.   Access to the abdomen was achieved using a 5 mm 0 laparoscope thru a 5 mm trocar In the left upper Quadrant 2 fingerbreadths below the left subcostal margin using the Optiview technique. Pneumoperitoneum was smoothly established up to 15 mm of  mercury. The laparoscope was advanced and the abdominal cavity was surveilled. The patient was then placed in reverse Trendelenburg.   A 5 mm trocar was placed slightly above and to the left of the umbilicus under direct visualization.  The [facility] liver retractor was placed under the left lobe of the liver through a 5 mm trocar incision site in the subxiphoid position. A 5 mm trocar was placed in the lateral right upper quadrant along with a 15 mm trocar in the mid right abdomen. A final 5 mm trocar was placed in the lateral LUQ.  All under direct visualization after exparel had been infiltrated in bilateral lateral upper abdominal walls as a TAP block.  The stomach was inspected. It was completely decompressed and the orogastric tube was removed. There appeared to be a submucosal lesion in midportion of stomach, about 1 cm in size which is going to be outside of staple line  There was no anterior dimple that was obviously visible. Her preop UGI showed no hiatal hernia.  We identified the pylorus and measured 6 cm proximal to the pylorus and identified an area of where we would start taking down the short gastric vessels. Harmonic scalpel was used to take down the short gastric vessels along the greater curvature of the stomach. We were able to enter the lesser sac. We continued to march along the greater curvature of the stomach taking down the short gastrics. As we approached the gastrosplenic ligament we took care in this area not to injure the spleen. We were able to take down the entire gastrosplenic ligament. We then mobilized the fundus away from the left crus of diaphragm. There were some posterior gastric avascular attachments  which were taken down. This left the stomach completely mobilized. No vessels had been taken down along the lesser curvature of the stomach.  We then reidentified the pylorus. A 40Fr ViSiGi was then placed in the oropharynx and advanced down into the stomach and placed in  the distal antrum and positioned along the lesser curvature. It was placed under suction which secured the 40Fr ViSiGi in place along the lesser curve. Then using the Ethicon echelon 60 mm stapler with a black load with Seamguard, I placed a stapler along the antrum approximately 5 cm from the pylorus. The stapler was angled so that there is ample room at the angularis incisura. I then fired the first staple load after inspecting it posteriorly to ensure adequate space both anteriorly and posteriorly. At this point I still was not completely past the angularis so with a green load with Seamguard, I placed the stapler in position just inside the prior stapleline. We then rotated the stomach to insure that there was adequate anteriorly as well as posteriorly. The stapler was then fired.  At this point I started using 60 mm blue load staple cartridges with Seamguard. The echelon stapler was then repositioned with a 60 mm blue load with Seamguard and we continued to march up along the Dana. On the third staple fire, we were able to get pass the area of the submucosal mass - it was in the gastric remnant.  My assistant was holding traction along the greater curvature stomach along the cauterized short gastric vessels ensuring that the stomach was symmetrically retracted. Prior to each firing of the staple, we rotated the stomach to ensure that there is adequate stomach left.  As we approached the fundus, I used 60 mm blue cartridge with Seamguard aiming  lateral to the GE junction after mobilizing some of the esophageal fat pad.  The sleeve was inspected. There is no evidence of cork screw. The staple line appeared hemostatic. The CRNA inflated the ViSiGi to the green zone and the upper abdomen was flooded with saline. There were no bubbles. The sleeve was decompressed and the ViSiGi removed. My assistant scrubbed out and performed an upper endoscopy. The sleeve easily distended with air and the scope was easily  advanced to the pylorus. There is no evidence of internal bleeding or cork screwing. There was no narrowing at the angularis. There is no evidence of bubbles. Please see his operative note for further details. The gastric sleeve was decompressed and the endoscope was removed.  The greater curvature the stomach was grasped with a laparoscopic grasper and removed from the 15 mm trocar site.  I was able to extract the greater curvature of the stomach which and the submucosal lesion within it through the abdominal wall; however her abdominal wall was quite thick in the stomach became disrupted on extraction.  The staple line opened up.  I inspected the specimen on the back table once I was able to get the stomach out of the abdominal cavity.  The submucosal lesion had become disrupted from the actual stomach itself.  It was isolated but sent in for analysis as well.  It was no longer attached to the specimen.  It appeared to be about 1 cm in size.  The liver retractor was removed. I then closed the 15 mm trocar site with 2 interrupted 0 Vicryl sutures through the fascia using the endoclose. The closure was viewed laparoscopically and it was airtight. Remaining Exparel was then infiltrated in the preperitoneal spaces  around the trocar sites. Pneumoperitoneum was released.  I irrigated the sleeve extraction trocar site since the stomach had become disrupted while trying to extract it from the abdominal cavity.  All trocar sites were closed with a 4-0 Monocryl in a subcuticular fashion followed by the application of benzoin, steri-strips, and bandaids. The patient was extubated and taken to the recovery room in stable condition. All needle, instrument, and sponge counts were correct x2. There are no immediate complications  (1) 60 mm black with seamguard (1) 60 mm green with Seamguard (3) 60 mm blue with  seamguard  PLAN OF CARE: Admit to inpatient   PATIENT DISPOSITION:  PACU - hemodynamically stable.   Delay  start of Pharmacological VTE agent (>24hrs) due to surgical blood loss or risk of bleeding:  no  Leighton Ruff. Redmond Pulling, MD, FACS FASMBS General, Bariatric, & Minimally Invasive Surgery [facility], [place]

## 2018-03-21 NOTE — Interval H&P Note (Signed)
History and Physical Interval Note:  03/21/2018 8:47 AM  Julia Monroe  has presented today for surgery, with the diagnosis of Morbid Obesity, Systemic Lupus, Pre-Diabetes, Right Hip Arthritis  The various methods of treatment have been discussed with the patient and family. After consideration of risks, benefits and other options for treatment, the patient has consented to  Procedure(s): LAPAROSCOPIC GASTRIC SLEEVE RESECTION, UPPER ENDO, ERAS Pathway (N/A) as a surgical intervention .  The patient's history has been reviewed, patient examined, no change in status, stable for surgery.  I have reviewed the patient's chart and labs.  Questions were answered to the patient's satisfaction.    Leighton Ruff. Redmond Pulling, MD, FACS General, Bariatric, & Minimally Invasive Surgery [facility] [place]   [name]

## 2018-03-21 NOTE — Anesthesia Preprocedure Evaluation (Addendum)
Anesthesia Evaluation  Patient identified by MRN, date of birth, ID band Patient awake    Reviewed: Allergy & Precautions, NPO status , Patient's Chart, lab work & pertinent test results  History of Anesthesia Complications Negative for: history of anesthetic complications  Airway Mallampati: I  TM Distance: >3 FB Neck ROM: Full    Dental  (+) Dental Advisory Given   Pulmonary neg pulmonary ROS,    breath sounds clear to auscultation       Cardiovascular (-) hypertensionnegative cardio ROS   Rhythm:Regular Rate:Normal     Neuro/Psych  Headaches,    GI/Hepatic negative GI ROS, Neg liver ROS,   Endo/Other  Morbid obesitySLE  Renal/GU negative Renal ROS     Musculoskeletal  (+) Arthritis ,   Abdominal (+) + obese,   Peds  Hematology   Anesthesia Other Findings   Reproductive/Obstetrics S/p hysterectomy                            Anesthesia Physical Anesthesia Plan  ASA: III  Anesthesia Plan: General   Post-op Pain Management:    Induction: Intravenous  PONV Risk Score and Plan: 4 or greater and Scopolamine patch - Pre-op, Dexamethasone and Ondansetron  Airway Management Planned: Oral ETT  Additional Equipment:   Intra-op Plan:   Post-operative Plan: Extubation in OR  Informed Consent: I have reviewed the patients History and Physical, chart, labs and discussed the procedure including the risks, benefits and alternatives for the proposed anesthesia with the patient or authorized representative who has indicated his/her understanding and acceptance.     Dental advisory given  Plan Discussed with: CRNA and Surgeon  Anesthesia Plan Comments: (Plan routine monitors, GETA)        Anesthesia Quick Evaluation

## 2018-03-22 ENCOUNTER — Encounter (HOSPITAL_COMMUNITY): Payer: Self-pay | Admitting: General Surgery

## 2018-03-22 LAB — CBC WITH DIFFERENTIAL/PLATELET
Abs Immature Granulocytes: 0.07 10*3/uL (ref 0.00–0.07)
BASOS ABS: 0 10*3/uL (ref 0.0–0.1)
Basophils Relative: 0 %
Eosinophils Absolute: 0 10*3/uL (ref 0.0–0.5)
Eosinophils Relative: 0 %
HEMATOCRIT: 38 % (ref 36.0–46.0)
HEMOGLOBIN: 11.4 g/dL — AB (ref 12.0–15.0)
Immature Granulocytes: 0 %
LYMPHS ABS: 1.7 10*3/uL (ref 0.7–4.0)
Lymphocytes Relative: 11 %
MCH: 26.6 pg (ref 26.0–34.0)
MCHC: 30 g/dL (ref 30.0–36.0)
MCV: 88.8 fL (ref 80.0–100.0)
Monocytes Absolute: 1.3 10*3/uL — ABNORMAL HIGH (ref 0.1–1.0)
Monocytes Relative: 8 %
Neutro Abs: 13 10*3/uL — ABNORMAL HIGH (ref 1.7–7.7)
Neutrophils Relative %: 81 %
Platelets: 294 10*3/uL (ref 150–400)
RBC: 4.28 MIL/uL (ref 3.87–5.11)
RDW: 15.1 % (ref 11.5–15.5)
WBC: 16 10*3/uL — ABNORMAL HIGH (ref 4.0–10.5)
nRBC: 0 % (ref 0.0–0.2)

## 2018-03-22 LAB — COMPREHENSIVE METABOLIC PANEL
ALBUMIN: 3.6 g/dL (ref 3.5–5.0)
ALT: 20 U/L (ref 0–44)
AST: 29 U/L (ref 15–41)
Alkaline Phosphatase: 42 U/L (ref 38–126)
Anion gap: 7 (ref 5–15)
BUN: 8 mg/dL (ref 6–20)
CALCIUM: 8.5 mg/dL — AB (ref 8.9–10.3)
CO2: 22 mmol/L (ref 22–32)
Chloride: 106 mmol/L (ref 98–111)
Creatinine, Ser: 0.54 mg/dL (ref 0.44–1.00)
GFR calc Af Amer: >60 mL/min (ref >60)
GFR calc non Af Amer: >60 mL/min (ref >60)
Glucose, Bld: 161 mg/dL — ABNORMAL HIGH (ref 70–99)
Potassium: 3.9 mmol/L (ref 3.5–5.1)
Sodium: 135 mmol/L (ref 135–145)
Total Bilirubin: 0.5 mg/dL (ref 0.3–1.2)
Total Protein: 7.1 g/dL (ref 6.5–8.1)

## 2018-03-22 MED ORDER — TRAMADOL HCL 50 MG PO TABS: 50.0000 mg | ORAL_TABLET | Freq: Four times a day (QID) | ORAL | 0 refills | Status: DC | PRN

## 2018-03-22 MED ORDER — PANTOPRAZOLE SODIUM 40 MG PO TBEC: 40.0000 mg | DELAYED_RELEASE_TABLET | Freq: Every day | ORAL | 0 refills | Status: DC

## 2018-03-22 MED ORDER — ONDANSETRON 4 MG PO TBDP: 4.0000 mg | ORAL_TABLET | Freq: Four times a day (QID) | ORAL | 0 refills | Status: DC | PRN

## 2018-03-22 NOTE — Progress Notes (Signed)
Patient alert and oriented, Post op day 1.  Provided support and encouragement.  Encouraged pulmonary toilet, ambulation and small sips of liquids.  Completed 12 ounces of bari clear fluid and 2 ounces of protein.  All questions answered.  Will continue to monitor. 

## 2018-03-22 NOTE — Progress Notes (Signed)
1 Day Post-Op   Subjective/Chief Complaint: Had some nausea but feels much better after zofran Pain better Walked multiple times On shake  Objective: Vital signs in last 24 hours: Temp:  [97.8 F (36.6 C)-99.9 F (37.7 C)] 99.2 F (37.3 C) (02/25 0539) Pulse Rate:  [76-93] 85 (02/25 0539) Resp:  [14-23] 20 (02/25 0539) BP: (126-159)/(66-96) 146/74 (02/25 0539) SpO2:  [97 %-100 %] 100 % (02/25 0539) Weight:  [158.3 kg] 158.3 kg (02/24 0753) Last BM Date: 03/21/18  Intake/Output from previous day: 02/24 0701 - 02/25 0700 In: 3040.2 [P.O.:480; I.V.:2560.2] Out: 1250 [Urine:1225; Blood:25] Intake/Output this shift: No intake/output data recorded.  Alert, nontoxic, nad cta Reg Soft, min TTP, incisions ok No edema  Lab Results:  Recent Labs    03/21/18 1202 03/22/18 0400  WBC  --  16.0*  HGB 12.7 11.4*  HCT 41.8 38.0  PLT  --  294   BMET Recent Labs    03/22/18 0400  NA 135  K 3.9  CL 106  CO2 22  GLUCOSE 161*  BUN 8  CREATININE 0.54  CALCIUM 8.5*   PT/INR No results for input(s): LABPROT, INR in the last 72 hours. ABG No results for input(s): PHART, HCO3 in the last 72 hours.  Invalid input(s): PCO2, PO2  Studies/Results: No results found.  Anti-infectives: Anti-infectives (From admission, onward)   Start     Dose/Rate Route Frequency Ordered Stop   03/21/18 0730  cefoTEtan (CEFOTAN) 2 g in sodium chloride 0.9 % 100 mL IVPB     2 g 200 mL/hr over 30 Minutes Intravenous On call to O.R. 03/21/18 0725 03/21/18 1006      Assessment/Plan: s/p Procedure(s): LAPAROSCOPIC GASTRIC SLEEVE RESECTION, UPPER ENDO, ERAS Pathway (N/A)  -Doing well. No fever. No tachycardia. Bump in wbc but o/w looks great. Will monitor vitals throughout AM -Cont diet as tolerated -start dc teaching Cont vte prophlayxssi  discused intraop findings  Leighton Ruff. Redmond Pulling, MD, FACS General, Bariatric, & Minimally Invasive Surgery [facility], [place]    LOS: 1 day     [name] 03/22/2018

## 2018-03-22 NOTE — Progress Notes (Signed)
Pt started protein at 2230 and is tolerating well.

## 2018-03-22 NOTE — Plan of Care (Signed)

## 2018-03-24 ENCOUNTER — Other Ambulatory Visit: Payer: Self-pay | Admitting: *Deleted

## 2018-03-24 NOTE — Patient Outreach (Addendum)
[facility]) Care Management  03/24/2018  Julia Monroe 26-Sep-1970 [id number]  Transition of care call   Referral received: 03/08/18 Initial outreach: 03/17/18 for pre-op call Insurance: [facility] Choice Plan   Subjective: Initial successful telephone call to patient's preferred number in order to complete transition of care assessment; 2 HIPAA identifiers verified. Explained purpose of call and completed transition of care assessment.  Julia Monroe states she is doing well, denies post op problems, states surgical pain well managed with prescribed medications, tolerating post bariatric surgery diet , denies bowel or bladder problems.  Mom and daughter are assisting her with her recovery.  She says she is supposed to return to work on 3/16 but says she doesn't see Dr Redmond Pulling until 3/18.  Julia Monroe is not sure if she is receiving the [facility] healthy lifestyle premium and would like information on hoe to qualify.    Objective:  Julia Monroe had LAPAROSCOPIC GASTRIC SLEEVE RESECTION, UPPER ENDO on 03/21/18 at [facility]. She was discharged to home on 2/25 /20 without the need for home health services or DME.   Assessment:  Patient voices good understanding of all discharge instructions.  See transition of care flowsheet for assessment details.   Plan:  Julia Monroe to call her surgeon's office as soon as possible and tell them she is scheduled to return to work on 3/16 but doesn't see Dr Redmond Pulling until 3/18 and she will need written permission from Dr. Redmond Pulling before she can return to work. Reviewed [employer] 2020 Wellness Requirements of: Completing the computerized Health Assessment and the Health Action Step with Active Health Management [facility]) by September 27 2018 AND have an annual physical between January 26, 2017 and July 27, 2018. Julia Monroe the My Active Health web site address to her personal e-mail address so that she can complete  her health risk assessment and complete her health action step while she is recovering from surgery.  No ongoing care management needs identified so will close case to [place] [facility] care management services and route successful outreach letter with [place] Management pamphlet and 24 Hour Nurse Line Magnet to [place] [facility] clinical pool to be mailed to patient's home address.   Barrington Ellison RN,CCM,CDE [place] Management Coordinator Office Phone [phone] Office Fax [phone]

## 2018-03-25 NOTE — Discharge Summary (Signed)
Physician Discharge Summary  Bay Jarquin JXB:[id number] DOB: 1970/01/28 DOA: 03/21/2018  PCP: Lucretia Kern, DO  Admit date: 03/21/2018 Discharge date: 03/22/2018  Recommendations for Outpatient Follow-up:   Follow-up Information    [name], MD. Go on 04/13/2018.   Specialty:  General Surgery Why:  at [address] information: [address] [place] [address] [phone]        Carlena Hurl, PA-C. Go on 05/05/2018.   Specialty:  General Surgery Why:  at 9 Contact information: [place] [address] [phone]          Discharge Diagnoses:  Principal Problem:   Class 3 severe obesity due to excess calories without serious comorbidity with body mass index (BMI) of 50.0 to 59.9 in adult [facility]) Active Problems:   Primary osteoarthritis of right hip   Primary osteoarthritis of both knees   Prediabetes   SLE (systemic lupus erythematosus) (HCC)   Severe obesity ([place])   Surgical Procedure: Laparoscopic Sleeve Gastrectomy, upper endoscopy  Discharge Condition: Good Disposition: Home  Diet recommendation: Postoperative sleeve gastrectomy diet (liquids only)  Filed Weights   03/21/18 0700 03/21/18 0753  Weight: (!) 158.9 kg (!) 158.3 kg     Hospital Course:  The patient was admitted for a planned laparoscopic sleeve gastrectomy. Please see operative note. Preoperatively the patient was given 5000 units of subcutaneous heparin for DVT prophylaxis. Postoperative prophylactic Lovenox dosing was started on the evening of postoperative day 0. ERAS protocol was used. On the evening of postoperative day 0, the patient was started on water and ice chips. On postoperative day 1 the patient had no fever or tachycardia and was tolerating water in their diet was gradually advanced throughout the day. The patient was ambulating without difficulty. Their vital signs are stable without fever or tachycardia. Their hemoglobin had remained  stable.  The patient had received discharge instructions and counseling. They were deemed stable for discharge and had met discharge criteria   Discharge Instructions  Discharge Instructions    Ambulate hourly while awake   Complete by:  As directed    Call MD for:  difficulty breathing, headache or visual disturbances   Complete by:  As directed    Call MD for:  persistant dizziness or light-headedness   Complete by:  As directed    Call MD for:  persistant nausea and vomiting   Complete by:  As directed    Call MD for:  redness, tenderness, or signs of infection (pain, swelling, redness, odor or green/yellow discharge around incision site)   Complete by:  As directed    Call MD for:  severe uncontrolled pain   Complete by:  As directed    Call MD for:  temperature >101 F   Complete by:  As directed    Diet bariatric full liquid   Complete by:  As directed    Discharge instructions   Complete by:  As directed    See bariatric discharge instructions   Incentive spirometry   Complete by:  As directed    Perform hourly while awake     Allergies as of 03/22/2018      Reactions   Monosodium Glutamate Other (See Comments)   Loses vision, headache/migraine   Lactose Intolerance (gi) Other (See Comments)   Gi upset   Caffeine Other (See Comments)   Severe headache    Hydrocodone Other (See Comments)   Headaches/migraines    Oxycodone Other (See Comments)  Headache/Migraine      Medication List    STOP taking these medications   hydroxychloroquine 200 MG tablet Commonly known as:  PLAQUENIL     TAKE these medications   ALKA-SELTZER PLUS COLD PO Take 2 tablets by mouth daily as needed (allergies).   Biotin 10000 MCG Tbdp Take 10,000 mcg by mouth daily.   calcium carbonate 1250 (500 Ca) MG tablet Commonly known as:  OS-CAL - dosed in mg of elemental calcium Take 500 mg by mouth 3 (three) times daily.   CAMBIA 50 MG Pack Generic drug:  Diclofenac Potassium Take 50  mg by mouth daily as needed (migraine).   diclofenac sodium 1 % Gel Commonly known as:  VOLTAREN Apply 3 gm to 3 large joints up to 3 times a day.Dispense 3 tubes with 3 refills. What changed:    how much to take  how to take this  when to take this  reasons to take this  additional instructions   IRON PO Take 10 mLs by mouth daily. LIQUID IRON (Ferric Glycinate) 18 mg per 10 ml   multivitamin with minerals Tabs tablet Take 1 tablet by mouth 2 (two) times daily.   MUSCLE RUB 10-15 % Crea Apply 1 application topically as needed for muscle pain.   ondansetron 4 MG disintegrating tablet Commonly known as:  ZOFRAN-ODT Take 1 tablet (4 mg total) by mouth every 6 (six) hours as needed for nausea or vomiting.   pantoprazole 40 MG tablet Commonly known as:  PROTONIX Take 1 tablet (40 mg total) by mouth daily.   traMADol 50 MG tablet Commonly known as:  ULTRAM Take 1 tablet (50 mg total) by mouth every 6 (six) hours as needed for severe pain. What changed:    how much to take  reasons to take this   TYLENOL 500 MG tablet Generic drug:  acetaminophen Take 1,500 mg by mouth 2 (two) times daily as needed for moderate pain.   VISINE OP Place 1 drop into both eyes 3 (three) times daily as needed (for irritated/dry eyes.).   Vitamin D 125 MCG (5000 UT) Caps Take 5,000 Units by mouth daily.   vitamin E 400 UNIT capsule Take 400 Units by mouth daily.      Follow-up Information    [name], MD. Go on 04/13/2018.   Specialty:  General Surgery Why:  at [address] information: [address]  [place] [address] [phone]        Carlena Hurl, PA-C. Go on 05/05/2018.   Specialty:  General Surgery Why:  at [address] information: [place] [address] [phone]            The results of significant diagnostics from this hospitalization (including imaging, microbiology, ancillary and laboratory) are listed below for  reference.    Significant Diagnostic Studies: No results found.  Labs: Basic Metabolic Panel: Recent Labs  Lab 03/22/18 0400  NA 135  K 3.9  CL 106  CO2 22  GLUCOSE 161*  BUN 8  CREATININE 0.54  CALCIUM 8.5*   Liver Function Tests: Recent Labs  Lab 03/22/18 0400  AST 29  ALT 20  ALKPHOS 42  BILITOT 0.5  PROT 7.1  ALBUMIN 3.6    CBC: Recent Labs  Lab 03/21/18 1202 03/22/18 0400  WBC  --  16.0*  NEUTROABS  --  13.0*  HGB 12.7 11.4*  HCT 41.8 38.0  MCV  --  88.8  PLT  --  294    CBG: No results for input(s): GLUCAP in the last 168 hours.  Principal Problem:   Class 3 severe obesity due to excess calories without serious comorbidity with body mass index (BMI) of 50.0 to 59.9 in adult [facility]) Active Problems:   Primary osteoarthritis of right hip   Primary osteoarthritis of both knees   Prediabetes   SLE (systemic lupus erythematosus) ([place])   Severe obesity ([place])   Time coordinating discharge: 15 min  Signed:  Gayland Curry, MD [facility], [place] [phone] 03/25/2018, 3:35 PM

## 2018-03-28 ENCOUNTER — Telehealth (HOSPITAL_COMMUNITY): Payer: Self-pay

## 2018-03-28 NOTE — Telephone Encounter (Signed)
Patient called to discuss post bariatric surgery follow up questions.  See below:   1.  Tell me about your pain and pain management?denies  2.  Let's talk about fluid intake.  How much total fluid are you taking in?60-70  3.  How much protein have you taken in the last 2 days?60+  4.  Have you had nausea?  Tell me about when have experienced nausea and what you did to help?denies  5.  Has the frequency or color changed with your urine?clear  6.  Tell me what your incisions look like?no prblems  7.  Have you been passing gas? BM?passing gas, had bms  8.  If a problem or question were to arise who would you call?  Do you know contact numbers for [place], [facility], and [facility]?aware of how to call all services  9.  How has the walking going?walking around regularly  10.  How are your vitamins and calcium going?  How are you taking them?no trouble

## 2018-03-29 ENCOUNTER — Inpatient Hospital Stay: Admit: 2018-03-29 | Payer: 59 | Admitting: General Surgery

## 2018-03-29 SURGERY — GASTRECTOMY, SLEEVE, LAPAROSCOPIC
Anesthesia: General

## 2018-04-02 NOTE — Progress Notes (Signed)
HPI:  Using dictation device. Unfortunately this device frequently misinterprets words/phrases.  Julia Monroe is a pleasant 48 yo with a PMH significant for Morbid obesity, hyperglycemia, hyperlipidemia, Migraines, Lupus (sees rheumatology, Dr. Estanislado Pandy for management) here for follow up. S/p gastric sleeve surgery 03/21/18. Reports is doing well and has recovered well. Sees her surgeon in a few days and hopes to return to work. Reports had a bunch of labs in Jan and Feb with surgery and also with her rheumatologist. But, does want to check a lipid panel. No pain, SOB, complaints today.  ROS: See pertinent positives and negatives per HPI.  Past Medical History:  Diagnosis Date  . Arthritis    osteoarthritis  . Complication of anesthesia    bottom of feet itching  . Elevated hemoglobin A1c 2018   6.1  . Headache(784.0)    hx migraines  . Lupus ([place]) 2018   Dr.Deveshwar    Past Surgical History:  Procedure Laterality Date  . ABDOMINAL HYSTERECTOMY    . CESAREAN SECTION  2000  . CYSTOSCOPY N/A 07/06/2016   Procedure: CYSTOSCOPY;  Surgeon: Nunzio Cobbs, MD;  Location: [facility];  Service: Gynecology;  Laterality: N/A;  . JOINT REPLACEMENT  L   Left hip replacement  . LAPAROSCOPIC GASTRIC SLEEVE RESECTION N/A 03/21/2018   Procedure: LAPAROSCOPIC GASTRIC SLEEVE RESECTION, UPPER ENDO, ERAS Pathway;  Surgeon: [name], MD;  Location: [facility];  Service: General;  Laterality: N/A;  . OOPHORECTOMY  2007   Rt.oophorectomy   . OVARIAN CYST SURGERY  ? and 10   rt removed,cyst off lft  . TOTAL HIP ARTHROPLASTY  07/06/2011   Procedure: TOTAL HIP ARTHROPLASTY;  Surgeon: Kerin Salen, MD;  Location: [facility];  Service: Orthopedics;  Laterality: Left;  DEPUY/PINNACLE  . TOTAL LAPAROSCOPIC HYSTERECTOMY WITH SALPINGECTOMY Left 07/06/2016   Procedure: HYSTERECTOMY TOTAL LAPAROSCOPIC WITH Left SALPINGECTOMY, Suture Left Side Wall Laceration;  Surgeon: Nunzio Cobbs, MD;   Location: [facility];  Service: Gynecology;  Laterality: Left;    Family History  Problem Relation Age of Onset  . Osteoarthritis Mother   . Osteoarthritis Brother   . Hypertension Brother   . Osteoarthritis Maternal Grandmother   . Arthritis Maternal Grandmother     SOCIAL HX: see hpi   Current Outpatient Medications:  .  acetaminophen (TYLENOL) 500 MG tablet, Take 1,500 mg by mouth 2 (two) times daily as needed for moderate pain. , Disp: , Rfl:  .  Biotin 10000 MCG TBDP, Take 10,000 mcg by mouth daily., Disp: , Rfl:  .  calcium carbonate (OS-CAL - DOSED IN MG OF ELEMENTAL CALCIUM) 1250 (500 Ca) MG tablet, Take 500 mg by mouth 3 (three) times daily., Disp: , Rfl:  .  CALCIUM PO, Take by mouth daily., Disp: , Rfl:  .  CAMBIA 50 MG PACK, Take 50 mg by mouth daily as needed (migraine). , Disp: , Rfl: 0 .  Chlorphen-Phenyleph-ASA (ALKA-SELTZER PLUS COLD PO), Take 2 tablets by mouth daily as needed (allergies)., Disp: , Rfl:  .  Cholecalciferol (VITAMIN D) 125 MCG (5000 UT) CAPS, Take 5,000 Units by mouth daily., Disp: , Rfl:  .  diclofenac sodium (VOLTAREN) 1 % GEL, Apply 3 gm to 3 large joints up to 3 times a day.Dispense 3 tubes with 3 refills. (Patient taking differently: Apply 3 g topically 3 (three) times daily as needed (pain). ), Disp: 3 Tube, Rfl: 0 .  IRON PO, Take 10 mLs by mouth daily. LIQUID  IRON (Ferric Glycinate) 18 mg per 10 ml, Disp: , Rfl:  .  Menthol-Methyl Salicylate (MUSCLE RUB) 10-15 % CREA, Apply 1 application topically as needed for muscle pain., Disp: , Rfl:  .  NON FORMULARY, daily. Bariatric multiviitamin, Disp: , Rfl:  .  Tetrahydrozoline HCl (VISINE OP), Place 1 drop into both eyes 3 (three) times daily as needed (for irritated/dry eyes.)., Disp: , Rfl:  .  traMADol (ULTRAM) 50 MG tablet, Take 1 tablet (50 mg total) by mouth every 6 (six) hours as needed for severe pain., Disp: 15 tablet, Rfl: 0 .  vitamin E 400 UNIT capsule, Take 400 Units by mouth daily., Disp: ,  Rfl:   EXAM:  Vitals:   04/04/18 0937  BP: 112/76  Pulse: 85  Temp: 98.4 F (36.9 C)  SpO2: 96%    Body mass index is 49.81 kg/m.  GENERAL: vitals reviewed and listed above, alert, oriented, appears well hydrated and in no acute distress  HEENT: atraumatic, conjunttiva clear, no obvious abnormalities on inspection of external nose and ears  NECK: no obvious masses on inspection  LUNGS: clear to auscultation bilaterally, no wheezes, rales or rhonchi, good air movement  CV: HRRR, no peripheral edema  MS: moves all extremities without noticeable abnormality  PSYCH: pleasant and cooperative, no obvious depression or anxiety  ASSESSMENT AND PLAN:  Discussed the following assessment and plan:  Class 3 severe obesity due to excess calories without serious comorbidity with body mass index (BMI) of 50.0 to 59.9 in adult [facility]) - Plan: Lipid panel  Prediabetes  Systemic lupus erythematosus, unspecified SLE type, unspecified organ involvement status ([place])  -labs per orders -lifetstyle recs - currently under bariatric office care -follow up CPE in 4-6 months (reports she sees gyn for gyn exams) -Patient advised to return or notify a doctor immediately if symptoms worsen or persist or new concerns arise.  Patient Instructions  BEFORE YOU LEAVE: -update fall risks -lab -follow up: 4-6 months for CPE  We have ordered labs or studies at this visit. It can take up to 1-2 weeks for results and processing. IF results require follow up or explanation, we will call you with instructions. Clinically stable results will be released to your [facility]. If you have not heard from [place] or cannot find your results in [facility] in 2 weeks please contact our office at [phone].  If you are not yet signed up for [facility], please consider signing up.   Eat a healthy diet and get regular exercises according to your bariatric surgeon's instructions.       Lucretia Kern, DO

## 2018-04-04 ENCOUNTER — Encounter: Payer: Self-pay | Admitting: Family Medicine

## 2018-04-04 ENCOUNTER — Ambulatory Visit: Payer: 59 | Admitting: Family Medicine

## 2018-04-04 VITALS — BP 112/76 | HR 85 | Temp 98.4°F | Ht 69.0 in | Wt 337.3 lb

## 2018-04-04 DIAGNOSIS — Z6841 Body Mass Index (BMI) 40.0 and over, adult: Secondary | ICD-10-CM | POA: Diagnosis not present

## 2018-04-04 DIAGNOSIS — M329 Systemic lupus erythematosus, unspecified: Secondary | ICD-10-CM

## 2018-04-04 DIAGNOSIS — R7303 Prediabetes: Secondary | ICD-10-CM

## 2018-04-04 LAB — LIPID PANEL
Cholesterol: 127 mg/dL (ref 0–200)
HDL: 37.5 mg/dL — AB (ref >39.00)
LDL Cholesterol: 73 mg/dL (ref 0–99)
NonHDL: 89.26
Total CHOL/HDL Ratio: 3
Triglycerides: 83 mg/dL (ref 0.0–149.0)
VLDL: 16.6 mg/dL (ref 0.0–40.0)

## 2018-04-04 NOTE — Patient Instructions (Addendum)
BEFORE YOU LEAVE: -update fall risks -lab -follow up: 4-6 months for CPE  We have ordered labs or studies at this visit. It can take up to 1-2 weeks for results and processing. IF results require follow up or explanation, we will call you with instructions. Clinically stable results will be released to your [facility]. If you have not heard from [place] or cannot find your results in [facility] in 2 weeks please contact our office at [phone].  If you are not yet signed up for [facility], please consider signing up.   Eat a healthy diet and get regular exercises according to your bariatric surgeon's instructions.

## 2018-04-05 ENCOUNTER — Encounter: Payer: 59 | Attending: General Surgery | Admitting: Skilled Nursing Facility1

## 2018-04-05 DIAGNOSIS — Z6841 Body Mass Index (BMI) 40.0 and over, adult: Secondary | ICD-10-CM | POA: Insufficient documentation

## 2018-04-05 DIAGNOSIS — Z79899 Other long term (current) drug therapy: Secondary | ICD-10-CM | POA: Diagnosis not present

## 2018-04-05 DIAGNOSIS — M329 Systemic lupus erythematosus, unspecified: Secondary | ICD-10-CM | POA: Diagnosis not present

## 2018-04-05 DIAGNOSIS — R7303 Prediabetes: Secondary | ICD-10-CM | POA: Insufficient documentation

## 2018-04-05 DIAGNOSIS — M1611 Unilateral primary osteoarthritis, right hip: Secondary | ICD-10-CM | POA: Insufficient documentation

## 2018-04-05 DIAGNOSIS — Z713 Dietary counseling and surveillance: Secondary | ICD-10-CM | POA: Insufficient documentation

## 2018-04-05 NOTE — Progress Notes (Signed)
Bariatric Class:  Appt start time: 1530 end time:  1630.  2 Week Post-Operative Nutrition Class  Patient was seen on 04/05/2018 for Post-[facility]ative [facility]   Surgery date: 03/21/2018 Surgery type: Sleeve Start weight at [facility]: 358.3 Weight today: pt declined  The following the learning objectives were met by the patient during this course:  Identifies Phase 3A (Soft, High Proteins) Dietary Goals and will begin from 2 weeks post-operatively to 2 months post-operatively  Identifies appropriate sources of fluids and proteins   States protein recommendations and appropriate sources post-operatively  Identifies the need for appropriate texture modifications, mastication, and bite sizes when consuming solids  Identifies appropriate multivitamin and calcium sources post-operatively  Describes the need for physical activity post-operatively and will follow MD recommendations  States when to call healthcare provider regarding medication questions or post-operative complications  Handouts given during class include:  Phase 3A: Soft, High Protein Diet Handout  Follow-Up Plan: Patient will follow-up at [facility] in 6 weeks for 2 month post-op nutrition visit for diet advancement per MD.

## 2018-04-11 ENCOUNTER — Telehealth: Payer: Self-pay | Admitting: Skilled Nursing Facility1

## 2018-04-11 NOTE — Telephone Encounter (Signed)
RD called pt to verify fluid intake once starting soft, solid proteins 2 week post-bariatric surgery.   Daily Fluid intake: 45-50 oz; struggling with the schedule  Daily Protein intake: 77 grams   Concerns/issues:   Pt states she still loves eggs which is great.

## 2018-05-04 NOTE — Progress Notes (Signed)
Virtual Visit via Video Note  I connected with Julia Monroe on 05/10/18 at  2:30 PM EDT by a video enabled telemedicine application and verified that I am speaking with the correct person using two identifiers.   I discussed the limitations of evaluation and management by telemedicine and the availability of in person appointments. The patient expressed understanding and agreed to proceed.  CC: Discuss restarting on PLQ   History of Present Illness: Patient is a 48 year old female with a past medical history of systemic lupus erythematosus and osteoarthritis.  She is taking PLQ 200 mg BID.  She reports she had a gastric sleeve surgery performed on 03/21/18 and has been off of PLQ and has not restarted.  She has noticed some improvement in her joint pain and is working on weight.  She has chronic right hip joint pain.  She denies any signs or symptoms of a flare.  She denies any oral or nasal ulcerations. She denies any recent rashes.  She has no symptoms of Raynaud's.  She has no fatigue or fevers.  No palpitations or shortness of breath.  She denies any sicca symptoms. She has been riding a stationary bike and is trying to stay active.     Review of Systems  Constitutional: Negative for fever and malaise/fatigue.  HENT:       Denies oral or nasal ulcerations.   No sicca symptoms   Eyes: Negative for photophobia, pain, discharge and redness.  Respiratory: Negative for cough, shortness of breath and wheezing.   Cardiovascular: Negative for chest pain and palpitations.  Gastrointestinal: Negative for blood in stool, constipation and diarrhea.  Genitourinary: Negative for dysuria.  Musculoskeletal: Positive for joint pain. Negative for back pain, myalgias and neck pain.  Skin: Negative for rash.       Denies Raynaud's or hair loss.  Neurological: Negative for dizziness and headaches.  Psychiatric/Behavioral: Negative for depression. The patient is not nervous/anxious and does not have  insomnia.    Observations/Objective:  Physical Exam  Constitutional: She is oriented to person, place, and time.  Neurological: She is alert and oriented to person, place, and time.  Psychiatric: Mood, memory, affect and judgment normal.     Patient reports morning stiffness for 0  minutes.   Patient denies nocturnal pain.  Difficulty dressing/grooming: Denies Difficulty climbing stairs: Reports Difficulty getting out of chair: Reports Difficulty using hands for taps, buttons, cutlery, and/or writing: Denies    Assessment and Plan: Visit Diagnoses: Other systemic lupus erythematosus with other organ involvement ([place]) - History of positive ANA, DS DNA, elevated ESR, arthralgias, rash: She has not had any recent lupus flares.  She has been off of PLQ since the beginning on February due to having gastric bypass sleeve surgery on 03/21/18.  She was advised to restart taking PLQ 200 mg by mouth BID.  A refill was sent today.  She has no features of a flare at this time.  She has chronic right hip pain but she has no other joint pain or joint swelling at this time.  She continues to try to lose weight and has been riding the stationary bike for exercise.  She has no rashes, photosensitivity, hair loss, Raynaud's, oral or nasal ulcerations, or sicca symptoms.  She will restart taking PLQ as prescribed.  She was advised to notify us if she develops signs or symptoms of a flare. Orders of autoimmune labs will be faxed to the patient.  She will follow up in 3  months.   High risk medication use - PLQ 200 mg po bideye exam: 11/24/2016. CBC and CMP were drawn on 03/22/18.  Future lab orders were faxed to the patient.   Primary osteoarthritis of both knees-She has been riding a stationary bike for exercise.  She has no joint pain or joint swelling at this time.  She has some difficulty going up and down steps.    Primary osteoarthritis of right hip-Chronic pain.  She has difficulty going up and down  steps.  She has been riding a stationary bike for exercise.    History of total hip replacement, left-Doing well.  She has no discomfort at this time.   Other fatigue-She has no fatigue at this time.  She has not had any recent fevers.   Vitamin D deficiency-She is taking a vitamin D supplement. Future order placed today.    Follow Up Instructions: She will follow up in 3 months. Lab orders will be faxed to the patient.     I discussed the assessment and treatment plan with the patient. The patient was provided an opportunity to ask questions and all were answered. The patient agreed with the plan and demonstrated an understanding of the instructions.   The patient was advised to call back or seek an in-person evaluation if the symptoms worsen or if the condition fails to improve as anticipated.  I provided 22 minutes of non-face-to-face time during this encounter.  Bo Merino, MD   Scribed by-  Hazel Sams, PA-C

## 2018-05-05 ENCOUNTER — Ambulatory Visit: Payer: 59 | Admitting: Family Medicine

## 2018-05-05 ENCOUNTER — Other Ambulatory Visit: Payer: Self-pay

## 2018-05-05 ENCOUNTER — Encounter: Payer: Self-pay | Admitting: Family Medicine

## 2018-05-05 NOTE — Progress Notes (Signed)
Pt did not appear for evisit.  Attempted to call pt, but no answer.

## 2018-05-09 ENCOUNTER — Telehealth: Payer: Self-pay | Admitting: *Deleted

## 2018-05-09 ENCOUNTER — Other Ambulatory Visit: Payer: Self-pay

## 2018-05-09 ENCOUNTER — Encounter: Payer: Self-pay | Admitting: Family Medicine

## 2018-05-09 ENCOUNTER — Ambulatory Visit (INDEPENDENT_AMBULATORY_CARE_PROVIDER_SITE_OTHER): Payer: 59 | Admitting: Family Medicine

## 2018-05-09 DIAGNOSIS — M329 Systemic lupus erythematosus, unspecified: Secondary | ICD-10-CM

## 2018-05-09 DIAGNOSIS — Z8669 Personal history of other diseases of the nervous system and sense organs: Secondary | ICD-10-CM | POA: Diagnosis not present

## 2018-05-09 DIAGNOSIS — Z6841 Body Mass Index (BMI) 40.0 and over, adult: Secondary | ICD-10-CM

## 2018-05-09 DIAGNOSIS — M1611 Unilateral primary osteoarthritis, right hip: Secondary | ICD-10-CM | POA: Diagnosis not present

## 2018-05-09 NOTE — Telephone Encounter (Signed)
Copied from [employer] [id number]. Topic: General - Other >> May 06, 2018  9:42 AM Carolyn Stare wrote:   Pt left VM about her appt she had on 05/05/2018 that she missed cause she was at work , would like a call back

## 2018-05-09 NOTE — Telephone Encounter (Signed)
Pt rescheduled for a phone visit today at 3.30 pm with dr Volanda Napoleon

## 2018-05-09 NOTE — Progress Notes (Signed)
Virtual Visit via Telephone Note  I connected with Julia Monroe on 05/09/18 at  3:30 PM EDT by telephone and verified that I am speaking with the correct person using two identifiers.   I discussed the limitations, risks, security and privacy concerns of performing an evaluation and management service by telephone and the availability of in person appointments. I also discussed with the patient that there may be a patient responsible charge related to this service. The patient expressed understanding and agreed to proceed.  Location patient: home Location provider: home office Participants present for the call: patient, provider Patient did not have a visit in the prior 7 days to address this/these issue(s).   History of Present Illness: SLE:  ANA positive.  Pt on hydrochloroquine, had to stop prior to a possible procedure.  Followed by Rheum.  Has appt tomorrow, will find out if she needs to restart med.    Arthritis:  Pain "everywhere".  Had L hip replaced ~8 yrs ago.  Needs R THR (planned for October).  Trying to loose weight prior to surgery in October.  Has lost some weight, notes less pain in knees.  Obesity: S/p gastric sleeve.  Does yard work or gets on stationary bike.  Drinking 64-80 oz of water per day.  Still taking protein.  Has yet to add vegetables.  Taking vitamins daily.  Lost 49 lbs.  Was 364 lbs prior to surgery.    Migraines: had one recently 2/2 sharp cheddar cheese.  Has allergy to monosodium glutamate-causes migraines.   Observations/Objective: Patient sounds cheerful and well on the phone. I do not appreciate any SOB. Speech and thought processing are grossly intact. Patient reported vitals:  RR between 12-20 bpm.  Assessment and Plan: H/o migraine -avoid foods known to cause symptoms -discussed staying hydrated, getting plenty of rest, and limiting caffeine intake -Continue meds as needed  SLE, unspecified organ involvement status -Continue  following with rheumatology  Primary osteoarthritis of right hip -Continue following with Ortho -Continue weight loss  Class III severe obesity due to excess calories without serious comorbidity with BMI of 50-59 -Status post gastric sleeve -Patient congratulated on weight loss -Continue taking vitamins daily and sticking with diet -Continue exercise  Follow-up PRN in the next few months given COVID-19 situation.  I did not refer this patient for an OV in the next 24 hours for this/these issue(s).  I discussed the assessment and treatment plan with the patient. The patient was provided an opportunity to ask questions and all were answered. The patient agreed with the plan and demonstrated an understanding of the instructions.   The patient was advised to call back or seek an in-person evaluation if the symptoms worsen or if the condition fails to improve as anticipated.  I provided 14 minutes of non-face-to-face time during this encounter.   Billie Ruddy, MD

## 2018-05-10 ENCOUNTER — Encounter: Payer: Self-pay | Admitting: Rheumatology

## 2018-05-10 ENCOUNTER — Telehealth (INDEPENDENT_AMBULATORY_CARE_PROVIDER_SITE_OTHER): Payer: 59 | Admitting: Rheumatology

## 2018-05-10 ENCOUNTER — Other Ambulatory Visit: Payer: Self-pay

## 2018-05-10 DIAGNOSIS — R5383 Other fatigue: Secondary | ICD-10-CM

## 2018-05-10 DIAGNOSIS — Z79899 Other long term (current) drug therapy: Secondary | ICD-10-CM

## 2018-05-10 DIAGNOSIS — Z8669 Personal history of other diseases of the nervous system and sense organs: Secondary | ICD-10-CM

## 2018-05-10 DIAGNOSIS — Z96642 Presence of left artificial hip joint: Secondary | ICD-10-CM

## 2018-05-10 DIAGNOSIS — M3219 Other organ or system involvement in systemic lupus erythematosus: Secondary | ICD-10-CM

## 2018-05-10 DIAGNOSIS — M170 Bilateral primary osteoarthritis of knee: Secondary | ICD-10-CM

## 2018-05-10 DIAGNOSIS — M1611 Unilateral primary osteoarthritis, right hip: Secondary | ICD-10-CM

## 2018-05-10 DIAGNOSIS — Z8639 Personal history of other endocrine, nutritional and metabolic disease: Secondary | ICD-10-CM

## 2018-05-10 DIAGNOSIS — E559 Vitamin D deficiency, unspecified: Secondary | ICD-10-CM

## 2018-05-10 MED ORDER — HYDROXYCHLOROQUINE SULFATE 200 MG PO TABS: 200.0000 mg | ORAL_TABLET | Freq: Two times a day (BID) | ORAL | 0 refills | Status: DC

## 2018-05-11 ENCOUNTER — Other Ambulatory Visit: Payer: Self-pay | Admitting: Rheumatology

## 2018-05-11 ENCOUNTER — Other Ambulatory Visit: Payer: Self-pay

## 2018-05-11 DIAGNOSIS — Z7689 Persons encountering health services in other specified circumstances: Secondary | ICD-10-CM | POA: Diagnosis not present

## 2018-05-12 ENCOUNTER — Telehealth: Payer: Self-pay | Admitting: Rheumatology

## 2018-05-12 NOTE — Telephone Encounter (Signed)
I LMOM for patient to call, and schedule her next 3 month follow up appt with Hazel Sams, PAC.

## 2018-05-12 NOTE — Telephone Encounter (Signed)
-----   Message from [place] sent at 05/11/2018 10:10 AM EDT ----- Patient had virtual visit yesterday with Dr. Estanislado Pandy. Please call to schedule 3 month follow up. Thanks!

## 2018-05-12 NOTE — Progress Notes (Signed)
Labs are stable.  Elevated sedimentation rate most likely related to gastric bypass.

## 2018-05-17 ENCOUNTER — Ambulatory Visit: Payer: 59 | Admitting: Dietician

## 2018-05-18 ENCOUNTER — Encounter: Payer: Self-pay | Admitting: Dietician

## 2018-05-18 ENCOUNTER — Encounter: Payer: 59 | Attending: General Surgery | Admitting: Dietician

## 2018-05-18 ENCOUNTER — Other Ambulatory Visit: Payer: Self-pay

## 2018-05-18 DIAGNOSIS — E669 Obesity, unspecified: Secondary | ICD-10-CM

## 2018-05-18 DIAGNOSIS — R7303 Prediabetes: Secondary | ICD-10-CM | POA: Insufficient documentation

## 2018-05-18 DIAGNOSIS — M329 Systemic lupus erythematosus, unspecified: Secondary | ICD-10-CM | POA: Insufficient documentation

## 2018-05-18 DIAGNOSIS — Z713 Dietary counseling and surveillance: Secondary | ICD-10-CM | POA: Diagnosis not present

## 2018-05-18 DIAGNOSIS — Z79899 Other long term (current) drug therapy: Secondary | ICD-10-CM | POA: Insufficient documentation

## 2018-05-18 DIAGNOSIS — M1611 Unilateral primary osteoarthritis, right hip: Secondary | ICD-10-CM | POA: Insufficient documentation

## 2018-05-18 DIAGNOSIS — Z6841 Body Mass Index (BMI) 40.0 and over, adult: Secondary | ICD-10-CM | POA: Insufficient documentation

## 2018-05-18 LAB — URINALYSIS, ROUTINE W REFLEX MICROSCOPIC
Bilirubin, UA: NEGATIVE
Glucose, UA: NEGATIVE
Leukocytes,UA: NEGATIVE
Nitrite, UA: NEGATIVE
Protein,UA: NEGATIVE
RBC, UA: NEGATIVE
Specific Gravity, UA: >=1.03 — AB (ref 1.005–1.030)
Urobilinogen, Ur: 0.2 mg/dL (ref 0.2–1.0)
pH, UA: 5 (ref 5.0–7.5)

## 2018-05-18 LAB — CBC WITH DIFFERENTIAL/PLATELET
Basophils Absolute: 0 10*3/uL (ref 0.0–0.2)
Basos: 0 %
EOS (ABSOLUTE): 0.1 10*3/uL (ref 0.0–0.4)
Eos: 1 %
Hematocrit: 38.4 % (ref 34.0–46.6)
Hemoglobin: 12.6 g/dL (ref 11.1–15.9)
Immature Grans (Abs): 0 10*3/uL (ref 0.0–0.1)
Immature Granulocytes: 0 %
Lymphocytes Absolute: 2.2 10*3/uL (ref 0.7–3.1)
Lymphs: 26 %
MCH: 27.5 pg (ref 26.6–33.0)
MCHC: 32.8 g/dL (ref 31.5–35.7)
MCV: 84 fL (ref 79–97)
Monocytes Absolute: 0.6 10*3/uL (ref 0.1–0.9)
Monocytes: 7 %
Neutrophils Absolute: 5.7 10*3/uL (ref 1.4–7.0)
Neutrophils: 66 %
Platelets: 266 10*3/uL (ref 150–450)
RBC: 4.58 x10E6/uL (ref 3.77–5.28)
RDW: 15.4 % (ref 11.7–15.4)
WBC: 8.6 10*3/uL (ref 3.4–10.8)

## 2018-05-18 LAB — COMPREHENSIVE METABOLIC PANEL
ALT: 8 IU/L (ref 0–32)
AST: 14 IU/L (ref 0–40)
Albumin/Globulin Ratio: 1.4 (ref 1.2–2.2)
Albumin: 4.1 g/dL (ref 3.8–4.8)
Alkaline Phosphatase: 49 IU/L (ref 39–117)
BUN/Creatinine Ratio: 16 (ref 9–23)
BUN: 9 mg/dL (ref 6–24)
Bilirubin Total: 0.3 mg/dL (ref 0.0–1.2)
CO2: 22 mmol/L (ref 20–29)
Calcium: 9.6 mg/dL (ref 8.7–10.2)
Chloride: 101 mmol/L (ref 96–106)
Creatinine, Ser: 0.57 mg/dL (ref 0.57–1.00)
GFR calc Af Amer: 129 mL/min/{1.73_m2} (ref >59)
GFR calc non Af Amer: 112 mL/min/{1.73_m2} (ref >59)
Globulin, Total: 2.9 g/dL (ref 1.5–4.5)
Glucose: 75 mg/dL (ref 65–99)
Potassium: 4.2 mmol/L (ref 3.5–5.2)
Sodium: 139 mmol/L (ref 134–144)
Total Protein: 7 g/dL (ref 6.0–8.5)

## 2018-05-18 LAB — SEDIMENTATION RATE: Sed Rate: 41 mm/hr — ABNORMAL HIGH (ref 0–32)

## 2018-05-18 LAB — VITAMIN D 25 HYDROXY (VIT D DEFICIENCY, FRACTURES): Vit D, 25-Hydroxy: 36 ng/mL (ref 30.0–100.0)

## 2018-05-18 LAB — SPECIMEN STATUS REPORT

## 2018-05-18 NOTE — Progress Notes (Signed)
Bariatric Follow-Up Visit Medical Nutrition Therapy  Appt Start Time: 4:00pm End Time: 4:45pm  2 Months Post-Operative Sleeve Gastrectomy Surgery Surgery Date: 03/21/2018   NUTRITION ASSESSMENT  Anthropometrics  Start weight at [facility]: 358.3 lbs (12/29/2017) Today's weight: pt declined  Clinical Medical Hx: osteoarthritis, leukocytosis, prediabetes, migraine, systemic lupus erythematosus  Medications: see list   24-Hr Dietary Recall First Meal: 2 eggs + 2 slices cheese (72Q)  Snack: none   Second Meal: tuna + egg (26g)  Snack: none  Third Meal: salmon (20g)  Snack: none  Beverages: water + water w/ Crystal Light   Food & Nutrition Related Hx Dietary Hx: Taking regular bites and chewing thoroughly. Getting in protein every day, close to fluid goal every day. Seafood daily, such as salmon, catfish, scallops, etc. Enjoys eggs and tolerates them just fine. Will also prep pinto beans to have on hand in case she needs more protein. Pt states she can fit about 4 ounces of protein at a time before she is full, maybe 4.5 ounces at the most. Pt states she does not snack because she is not hungry between meals. Sets timers to remind when to take supplements and when to drink fluids, states she likes her schedule and enjoys what she is eating, also doesn't have cravings. States that certain hard cheeses give her migraines.   Supplements: MVI, calcium (3/day)  Estimated Daily Fluid Intake: ~64 oz Estimated Daily Protein Intake: 60-70 g  Physical Activity  Current average weekly physical activity: walking every morning (if weather doesn't allow walking, will use stationary bike inside)   Post-Op Goals Using straws: no Drinking while eating: no  Chewing/swallowing difficulties: no  Changes in vision: no Changes to mood/headaches: no (hx of migraines, but knows what her triggers are)  Hair loss/changes to skin/nails: no (some dry skin, maybe dt weather)  Difficulty focusing/concentrating:  no Sweating: no Dizziness/lightheadedness: no Palpitations: no  Carbonated/caffeinated beverages: no N/V/D/C/Gas: mild constipation (herbal tea helps)  Abdominal pain: no Dumping syndrome: no     NUTRITION DIAGNOSIS  Overweight/obesity ([place]-3.3) related to past poor dietary habits and physical inactivity as evidenced by patient w/ completed Sleeve Gastrectomy surgery following dietary guidelines for continued weight loss.   NUTRITION INTERVENTION Nutrition counseling (C-1) and education (E-2) to facilitate bariatric surgery goals, including: . Diet advancement to the next phase (phase IV) now including non-starchy vegetables  . The importance of consuming adequate calories as well as certain nutrients daily due to the body's need for essential vitamins, minerals, and fats . The importance of daily physical activity and to reach a goal of at least 150 minutes of moderate to vigorous physical activity weekly (or as directed by their physician) due to benefits such as increased musculature and improved lab values  Handouts Provided Include   Phase IV: Protein + Non-Starchy Vegetables   Bariatric Serving Sizes (per pt request)   Learning Style & Readiness for Change Teaching method utilized: Visual & Auditory  Demonstrated degree of understanding via: Teach Back  Barriers to learning/adherence to lifestyle change: None Identified     MONITORING & EVALUATION Dietary intake, weekly physical activity, and body weight in 4 months.  Next Steps Patient is to follow-up in 4 months for 6 month post-op class.

## 2018-05-18 NOTE — Patient Instructions (Signed)
.   Continue to aim for a minimum of 64 fluid ounces 7 days a week with at least 30 ounces being plain water . Eat non-starchy vegetables 2 times a day 7 days a week . Start out with soft cooked vegetables today and tomorrow; if tolerated, begin to eat raw vegetables including salads . Per meal/snack, eat 3 ounces of protein first then start on non-starchy vegetables; once you understand how much of your meal leads to satisfaction (and not full) while still eating 3 ounces of protein and non-starchy vegetables, you can eat them in any order (figure out how much you can eat at a time to get enough but not too much)  . Continue to aim for 30 minutes of physical activity at least 5 times a week  Keep up the GREAT work! We will see you in 4 months at the 6 Month Class.

## 2018-05-19 LAB — SPECIMEN STATUS REPORT

## 2018-05-19 LAB — C3 COMPLEMENT: Complement C3, Serum: 143 mg/dL (ref 82–167)

## 2018-05-19 LAB — C4 COMPLEMENT: Complement C4, Serum: 57 mg/dL — ABNORMAL HIGH (ref 14–44)

## 2018-05-19 LAB — ANTI-DNA ANTIBODY, DOUBLE-STRANDED: dsDNA Ab: 14 [IU]/mL — ABNORMAL HIGH (ref 0–9)

## 2018-06-28 ENCOUNTER — Encounter: Payer: 59 | Admitting: Family Medicine

## 2018-07-06 ENCOUNTER — Ambulatory Visit (INDEPENDENT_AMBULATORY_CARE_PROVIDER_SITE_OTHER): Payer: 59 | Admitting: Family Medicine

## 2018-07-06 ENCOUNTER — Other Ambulatory Visit: Payer: Self-pay

## 2018-07-06 ENCOUNTER — Encounter: Payer: Self-pay | Admitting: Family Medicine

## 2018-07-06 VITALS — BP 102/68 | HR 74 | Temp 97.8°F | Wt 285.0 lb

## 2018-07-06 DIAGNOSIS — R7303 Prediabetes: Secondary | ICD-10-CM | POA: Diagnosis not present

## 2018-07-06 DIAGNOSIS — M25551 Pain in right hip: Secondary | ICD-10-CM | POA: Diagnosis not present

## 2018-07-06 DIAGNOSIS — Z0000 Encounter for general adult medical examination without abnormal findings: Secondary | ICD-10-CM | POA: Diagnosis not present

## 2018-07-06 LAB — POCT GLYCOSYLATED HEMOGLOBIN (HGB A1C): Hemoglobin A1C: 5.2 % (ref 4.0–5.6)

## 2018-07-06 NOTE — Progress Notes (Signed)
Subjective:     Julia Monroe is a 48 y.o. female and is here for a comprehensive physical exam. The patient reports no problems.  Pt interested in having her hgb A1C checked to see if she is still prediabetic.  Pt also hoping to get R THR in the Fall, s/p L THR.  Pt wants to be able to work out, but is limited 2/2 her hip pain.  Pt continues to eat better.  Pt is s/p gastric sleeve.  She has lost 74 lbs since Feb.  Taking vitamins daily.  Social History   Socioeconomic History  . Marital status: Single    Spouse name: Not on file  . Number of children: Not on file  . Years of education: Not on file  . Highest education level: Not on file  Occupational History  . Not on file  Social Needs  . Financial resource strain: Not on file  . Food insecurity:    Worry: Not on file    Inability: Not on file  . Transportation needs:    Medical: Not on file    Non-medical: Not on file  Tobacco Use  . Smoking status: Never Smoker  . Smokeless tobacco: Never Used  Substance and Sexual Activity  . Alcohol use: No    Alcohol/week: 0.0 standard drinks  . Drug use: No  . Sexual activity: Yes    Partners: Male    Birth control/protection: Surgical    Comment: Hyst  Lifestyle  . Physical activity:    Days per week: Not on file    Minutes per session: Not on file  . Stress: Not on file  Relationships  . Social connections:    Talks on phone: Not on file    Gets together: Not on file    Attends religious service: Not on file    Active member of club or organization: Not on file    Attends meetings of clubs or organizations: Not on file    Relationship status: Not on file  . Intimate partner violence:    Fear of current or ex partner: Not on file    Emotionally abused: Not on file    Physically abused: Not on file    Forced sexual activity: Not on file  Other Topics Concern  . 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 PAP SMEAR-Modifier  08/14/2018  . HIV Screening  04/04/2019 (Originally 03/31/1985)  . INFLUENZA VACCINE  08/27/2018  . TETANUS/TDAP  01/27/2019    The following portions of the patient's history were reviewed and updated as appropriate: allergies, current medications, past family history, past medical history, past social history, past surgical history and problem list.  Review of Systems Pertinent items noted in HPI and remainder of comprehensive ROS otherwise negative.   Objective:    BP 102/68 (BP Location: Left Arm, Patient Position: Sitting, Cuff Size: Large) Comment (Cuff Size): Thigh cuff  Pulse 74   Temp 97.8 F (36.6 C) (Oral)   Wt 285 lb (129.3 kg)   LMP 04/29/2016 Comment: urine preg.negative 03/15/18  SpO2 97%   BMI 42.09 kg/m  General appearance: alert, cooperative and no distress Head: Normocephalic, without obvious abnormality, atraumatic  Eyes: conjunctivae/corneas clear. PERRL, EOM's intact. Fundi benign. Ears: normal TM's and external ear canals both ears Nose: Nares normal. Septum midline. Mucosa normal. No drainage or sinus tenderness. Throat: lips, mucosa, and tongue normal; teeth and gums normal Neck: no adenopathy, no carotid bruit, no JVD, supple, symmetrical, trachea midline and thyroid not enlarged, symmetric, no tenderness/mass/nodules Lungs: clear to auscultation bilaterally Heart: regular rate and rhythm, S1, S2 normal, no murmur, click, rub or gallop Abdomen: soft, non-tender; bowel sounds normal; no masses,  no organomegaly Extremities: extremities normal, atraumatic, no cyanosis or edema Pulses: 2+ and symmetric Skin: Skin color, texture, turgor normal. No rashes or lesions Neurologic: Alert and oriented X 3, normal strength and tone. Normal symmetric reflexes. Normal  coordination and gait    Assessment:    Healthy female exam.      Plan:     Anticipatory guidance given including wearing seatbelts, smoke detectors in the home, increasing physical activity, increasing p.o. intake of water and vegetables. -pt had labs a few months ago. -h/o prediabetes.  Hgb A1C 5.2% -given handout -continue lifestyle modifications.  Congratulated on weight loss. See After Visit Summary for Counseling Recommendations    Pain in R hip -2/2 OA -THR planned later this yr -continue f/u with Ortho.  Grier Mitts, MD

## 2018-07-06 NOTE — Patient Instructions (Signed)
Preventive Care 40-64 Years, Female Preventive care refers to lifestyle choices and visits with your health care provider that can promote health and wellness. What does preventive care include?   A yearly physical exam. This is also called an annual well check.  Dental exams once or twice a year.  Routine eye exams. Ask your health care provider how often you should have your eyes checked.  Personal lifestyle choices, including: ? Daily care of your teeth and gums. ? Regular physical activity. ? Eating a healthy diet. ? Avoiding tobacco and drug use. ? Limiting alcohol use. ? Practicing safe sex. ? Taking low-dose aspirin daily starting at age 50. ? Taking vitamin and mineral supplements as recommended by your health care provider. What happens during an annual well check? The services and screenings done by your health care provider during your annual well check will depend on your age, overall health, lifestyle risk factors, and family history of disease. Counseling Your health care provider may ask you questions about your:  Alcohol use.  Tobacco use.  Drug use.  Emotional well-being.  Home and relationship well-being.  Sexual activity.  Eating habits.  Work and work environment.  Method of birth control.  Menstrual cycle.  Pregnancy history. Screening You may have the following tests or measurements:  Height, weight, and BMI.  Blood pressure.  Lipid and cholesterol levels. These may be checked every 5 years, or more frequently if you are over 50 years old.  Skin check.  Lung cancer screening. You may have this screening every year starting at age 55 if you have a 30-pack-year history of smoking and currently smoke or have quit within the past 15 years.  Colorectal cancer screening. All adults should have this screening starting at age 50 and continuing until age 75. Your health care provider may recommend screening at age 45. You will have tests every  1-10 years, depending on your results and the type of screening test. People at increased risk should start screening at an earlier age. Screening tests may include: ? Guaiac-based fecal occult blood testing. ? Fecal immunochemical test (FIT). ? Stool DNA test. ? Virtual colonoscopy. ? Sigmoidoscopy. During this test, a flexible tube with a tiny camera (sigmoidoscope) is used to examine your rectum and lower colon. The sigmoidoscope is inserted through your anus into your rectum and lower colon. ? Colonoscopy. During this test, a long, thin, flexible tube with a tiny camera (colonoscope) is used to examine your entire colon and rectum.  Hepatitis C blood test.  Hepatitis B blood test.  Sexually transmitted disease (STD) testing.  Diabetes screening. This is done by checking your blood sugar (glucose) after you have not eaten for a while (fasting). You may have this done every 1-3 years.  Mammogram. This may be done every 1-2 years. Talk to your health care provider about when you should start having regular mammograms. This may depend on whether you have a family history of breast cancer.  BRCA-related cancer screening. This may be done if you have a family history of breast, ovarian, tubal, or peritoneal cancers.  Pelvic exam and Pap test. This may be done every 3 years starting at age 21. Starting at age 30, this may be done every 5 years if you have a Pap test in combination with an HPV test.  Bone density scan. This is done to screen for osteoporosis. You may have this scan if you are at high risk for osteoporosis. Discuss your test results, treatment options,   and if necessary, the need for more tests with your health care provider. Vaccines Your health care provider may recommend certain vaccines, such as:  Influenza vaccine. This is recommended every year.  Tetanus, diphtheria, and acellular pertussis (Tdap, Td) vaccine. You may need a Td booster every 10 years.  Varicella  vaccine. You may need this if you have not been vaccinated.  Zoster vaccine. You may need this after age 57.  Measles, mumps, and rubella (MMR) vaccine. You may need at least one dose of MMR if you were born in 1957 or later. You may also need a second dose.  Pneumococcal 13-valent conjugate (PCV13) vaccine. You may need this if you have certain conditions and were not previously vaccinated.  Pneumococcal polysaccharide (PPSV23) vaccine. You may need one or two doses if you smoke cigarettes or if you have certain conditions.  Meningococcal vaccine. You may need this if you have certain conditions.  Hepatitis A vaccine. You may need this if you have certain conditions or if you travel or work in places where you may be exposed to hepatitis A.  Hepatitis B vaccine. You may need this if you have certain conditions or if you travel or work in places where you may be exposed to hepatitis B.  Haemophilus influenzae type b (Hib) vaccine. You may need this if you have certain conditions. Talk to your health care provider about which screenings and vaccines you need and how often you need them. This information is not intended to replace advice given to you by your health care provider. Make sure you discuss any questions you have with your health care provider. Document Released: 02/08/2015 Document Revised: 03/04/2017 Document Reviewed: 11/13/2014 Elsevier Interactive Patient Education  2019 Elsevier Inc.  Preventing Type 2 Diabetes Mellitus Type 2 diabetes (type 2 diabetes mellitus) is a long-term (chronic) disease that affects blood sugar (glucose) levels. Normally, a hormone called insulin allows glucose to enter cells in the body. The cells use glucose for energy. In type 2 diabetes, one or both of these problems may be present:  The body does not make enough insulin.  The body does not respond properly to insulin that it makes (insulin resistance). Insulin resistance or lack of insulin  causes excess glucose to build up in the blood instead of going into cells. As a result, high blood glucose (hyperglycemia) develops, which can cause many complications. Being overweight or obese and having an inactive (sedentary) lifestyle can increase your risk for diabetes. Type 2 diabetes can be delayed or prevented by making certain nutrition and lifestyle changes. What nutrition changes can be made?   Eat healthy meals and snacks regularly. Keep a healthy snack with you for when you get hungry between meals, such as fruit or a handful of nuts.  Eat lean meats and proteins that are low in saturated fats, such as chicken, fish, egg whites, and beans. Avoid processed meats.  Eat plenty of fruits and vegetables and plenty of grains that have not been processed (whole grains). It is recommended that you eat: ? 1?2 cups of fruit every day. ? 2?3 cups of vegetables every day. ? 6?8 oz of whole grains every day, such as oats, whole wheat, bulgur, brown rice, quinoa, and millet.  Eat low-fat dairy products, such as milk, yogurt, and cheese.  Eat foods that contain healthy fats, such as nuts, avocado, olive oil, and canola oil.  Drink water throughout the day. Avoid drinks that contain added sugar, such as soda or sweet  tea.  Follow instructions from your health care provider about specific eating or drinking restrictions.  Control how much food you eat at a time (portion size). ? Check food labels to find out the serving sizes of foods. ? Use a kitchen scale to weigh amounts of foods.  Saute or steam food instead of frying it. Cook with water or broth instead of oils or butter.  Limit your intake of: ? Salt (sodium). Have no more than 1 tsp (2,400 mg) of sodium a day. If you have heart disease or high blood pressure, have less than ? tsp (1,500 mg) of sodium a day. ? Saturated fat. This is fat that is solid at room temperature, such as butter or fat on meat. What lifestyle changes can  be made? Activity   Do moderate-intensity physical activity for at least 30 minutes on at least 5 days of the week, or as much as told by your health care provider.  Ask your health care provider what activities are safe for you. A mix of physical activities may be best, such as walking, swimming, cycling, and strength training.  Try to add physical activity into your day. For example: ? Park in spots that are farther away than usual, so that you walk more. For example, park in a far corner of the parking lot when you go to the office or the grocery store. ? Take a walk during your lunch break. ? Use stairs instead of elevators or escalators. Weight Loss  Lose weight as directed. Your health care provider can determine how much weight loss is best for you and can help you lose weight safely.  If you are overweight or obese, you may be instructed to lose at least 5?7 % of your body weight. Alcohol and Tobacco   Limit alcohol intake to no more than 1 drink a day for nonpregnant women and 2 drinks a day for men. One drink equals 12 oz of beer, 5 oz of wine, or 1 oz of hard liquor.  Do not use any tobacco products, such as cigarettes, chewing tobacco, and e-cigarettes. If you need help quitting, ask your health care provider. Work With [place] Provider  Have your blood glucose tested regularly, as told by your health care provider.  Discuss your risk factors and how you can reduce your risk for diabetes.  Get screening tests as told by your health care provider. You may have screening tests regularly, especially if you have certain risk factors for type 2 diabetes.  Make an appointment with a diet and nutrition specialist (registered dietitian). A registered dietitian can help you make a healthy eating plan and can help you understand portion sizes and food labels. Why are these changes important?  It is possible to prevent or delay type 2 diabetes and related health problems  by making lifestyle and nutrition changes.  It can be difficult to recognize signs of type 2 diabetes. The best way to avoid possible damage to your body is to take actions to prevent the disease before you develop symptoms. What can happen if changes are not made?  Your blood glucose levels may keep increasing. Having high blood glucose for a long time is dangerous. Too much glucose in your blood can damage your blood vessels, heart, kidneys, nerves, and eyes.  You may develop prediabetes or type 2 diabetes. Type 2 diabetes can lead to many chronic health problems and complications, such as: ? Heart disease. ? Stroke. ? Blindness. ?  Kidney disease. ? Depression. ? Poor circulation in the feet and legs, which could lead to surgical removal (amputation) in severe cases. Where to find support  Ask your health care provider to recommend a registered dietitian, diabetes educator, or weight loss program.  Look for local or online weight loss groups.  Join a gym, fitness club, or outdoor activity group, such as a walking club. Where to find more information To learn more about diabetes and diabetes prevention, visit:  American Diabetes Association (ADA): www.diabetes.[employer] of Diabetes and Digestive and Kidney Diseases: FindSpin.nl To learn more about healthy eating, visit:  The U.S. Department of Agriculture Scientist, research (physical sciences)), Choose My Plate: http://wiley-williams.com/  Office of Disease Prevention and Health Promotion (ODPHP), Dietary Guidelines: SurferLive.at Summary  You can reduce your risk for type 2 diabetes by increasing your physical activity, eating healthy foods, and losing weight as directed.  Talk with your health care provider about your risk for type 2 diabetes. Ask about any blood tests or screening tests that you need to have. This information is not intended to replace advice given to you by your  health care provider. Make sure you discuss any questions you have with your health care provider. Document Released: 05/06/2015 Document Revised: 12/24/2016 Document Reviewed: 03/05/2015 Elsevier Interactive Patient Education  2019 [employer].

## 2018-07-07 ENCOUNTER — Encounter: Payer: Self-pay | Admitting: Family Medicine

## 2018-07-27 NOTE — Progress Notes (Deleted)
Office Visit Note  Patient: Julia Monroe             Date of Birth: [birth year]           MRN: [id number]             PCP: Billie Ruddy, MD Referring: Billie Ruddy, MD Visit Date: 08/09/2018 Occupation: @GUAROCC @  Subjective:  No chief complaint on file.   History of Present Illness: Julia Monroe is a 48 y.o. female ***   Activities of Daily Living:  Patient reports morning stiffness for *** {minute/hour:19697}.   Patient {ACTIONS;DENIES/REPORTS:21021675::"Denies"} nocturnal pain.  Difficulty dressing/grooming: {ACTIONS;DENIES/REPORTS:21021675::"Denies"} Difficulty climbing stairs: {ACTIONS;DENIES/REPORTS:21021675::"Denies"} Difficulty getting out of chair: {ACTIONS;DENIES/REPORTS:21021675::"Denies"} Difficulty using hands for taps, buttons, cutlery, and/or writing: {ACTIONS;DENIES/REPORTS:21021675::"Denies"}  No Rheumatology ROS completed.   PMFS History:  Patient Active Problem List   Diagnosis Date Noted  . Prediabetes 03/21/2018  . SLE (systemic lupus erythematosus) ([place]) 03/21/2018  . Obesity 03/21/2018  . History of migraine 08/27/2017  . Class 3 severe obesity due to excess calories without serious comorbidity with body mass index (BMI) of 50.0 to 59.9 in adult ([place]) 01/27/2017  . History of total hip replacement, left 10/27/2016  . Hyperglycemia 08/25/2016  . Leukocytosis 08/13/2016  . Status post laparoscopic hysterectomy 07/06/2016  . Primary osteoarthritis of both knees 04/29/2016  . ANA positive 04/29/2016  . Primary osteoarthritis of right hip 07/10/2011    Past Medical History:  Diagnosis Date  . Arthritis    osteoarthritis  . Complication of anesthesia    bottom of feet itching  . Elevated hemoglobin A1c 2018   6.1  . Headache(784.0)    hx migraines  . Lupus ([place]) 2018   Dr.Deveshwar    Family History  Problem Relation Age of Onset  . Osteoarthritis Mother   . Osteoarthritis Brother   . Hypertension Brother   .  Osteoarthritis Maternal Grandmother   . Arthritis Maternal Grandmother    Past Surgical History:  Procedure Laterality Date  . ABDOMINAL HYSTERECTOMY    . CESAREAN SECTION  2000  . CYSTOSCOPY N/A 07/06/2016   Procedure: CYSTOSCOPY;  Surgeon: Nunzio Cobbs, MD;  Location: [facility];  Service: Gynecology;  Laterality: N/A;  . JOINT REPLACEMENT  L   Left hip replacement  . LAPAROSCOPIC GASTRIC SLEEVE RESECTION N/A 03/21/2018   Procedure: LAPAROSCOPIC GASTRIC SLEEVE RESECTION, UPPER ENDO, ERAS Pathway;  Surgeon: [name], MD;  Location: [facility];  Service: General;  Laterality: N/A;  . OOPHORECTOMY  2007   Rt.oophorectomy   . OVARIAN CYST SURGERY  ? and 10   rt removed,cyst off lft  . TOTAL HIP ARTHROPLASTY  07/06/2011   Procedure: TOTAL HIP ARTHROPLASTY;  Surgeon: Kerin Salen, MD;  Location: [facility];  Service: Orthopedics;  Laterality: Left;  DEPUY/PINNACLE  . 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 Influenza-Unspecified 10/26/2016  . Pneumococcal Conjugate-13 11/02/2016  . Tdap 01/26/2009  Objective: Vital Signs: LMP 04/29/2016 Comment: urine preg.negative 03/15/18   Physical Exam   Musculoskeletal Exam: ***  CDAI Exam: CDAI Score: - Patient Global: -; Provider Global: - Swollen: -; Tender: - Joint Exam   No joint exam has been documented for this visit   There is currently no information documented on the homunculus. Go to the  Rheumatology activity and complete the homunculus joint exam.  Investigation: No additional findings.  Imaging: No results found.  Recent Labs: Lab Results  Component Value Date   WBC 8.6 05/11/2018   HGB 12.6 05/11/2018   PLT 266 05/11/2018   NA 139 05/11/2018   K 4.2 05/11/2018   CL 101 05/11/2018   CO2 22 05/11/2018   GLUCOSE 75 05/11/2018   BUN 9 05/11/2018   CREATININE 0.57 05/11/2018   BILITOT 0.3 05/11/2018   ALKPHOS 49 05/11/2018   AST 14 05/11/2018   ALT 8 05/11/2018   PROT 7.0 05/11/2018   ALBUMIN 4.1 05/11/2018   CALCIUM 9.6 05/11/2018   GFRAA 129 05/11/2018    Speciality Comments: PLQ eye exam: 11/24/2016 Normal. Dr. Thalia Bloodgood. Follow up in 1 year.  Procedures:  No procedures performed Allergies: Monosodium glutamate, Lactose intolerance (gi), Caffeine, Hydrocodone, and Oxycodone   Assessment / Plan:     Visit Diagnoses: No diagnosis found.   Orders: No orders of the defined types were placed in this encounter.  No orders of the defined types were placed in this encounter.   Face-to-face time spent with patient was *** minutes. Greater than 50% of time was spent in counseling and coordination of care.  Follow-Up Instructions: No follow-ups on file.   Earnestine Mealing, CMA  Note - This record has been created using Editor, commissioning.  Chart creation errors have been sought, but may not always  have been located. Such creation errors do not reflect on  the standard of medical care.

## 2018-08-02 DIAGNOSIS — M1611 Unilateral primary osteoarthritis, right hip: Secondary | ICD-10-CM | POA: Diagnosis not present

## 2018-08-02 DIAGNOSIS — M7662 Achilles tendinitis, left leg: Secondary | ICD-10-CM | POA: Diagnosis not present

## 2018-08-09 ENCOUNTER — Ambulatory Visit: Payer: Self-pay | Admitting: Physician Assistant

## 2018-08-22 ENCOUNTER — Ambulatory Visit: Payer: 59 | Attending: Orthopedic Surgery | Admitting: Physical Therapy

## 2018-08-22 ENCOUNTER — Other Ambulatory Visit: Payer: Self-pay

## 2018-08-22 ENCOUNTER — Encounter: Payer: Self-pay | Admitting: Physical Therapy

## 2018-08-22 DIAGNOSIS — M25572 Pain in left ankle and joints of left foot: Secondary | ICD-10-CM | POA: Diagnosis not present

## 2018-08-22 DIAGNOSIS — R2689 Other abnormalities of gait and mobility: Secondary | ICD-10-CM | POA: Insufficient documentation

## 2018-08-23 ENCOUNTER — Encounter: Payer: Self-pay | Admitting: Physical Therapy

## 2018-08-23 NOTE — Progress Notes (Signed)
Office Visit Note  Patient: Julia Monroe             Date of Birth: 12/05/1970           MRN: [id number]             PCP: Billie Ruddy, MD Referring: Billie Ruddy, MD Visit Date: 09/06/2018 Occupation: '@GUAROCC'$ @  Subjective:  Fatigue and joint pain.   History of Present Illness: Julia Monroe is a 48 y.o. female with history of systemic lupus dermatosis and osteoarthritis.  She denies any joint swelling.  She continues to have some discomfort in her hip joints in her left knee.  She denies any rash.  She denies any history of oral ulcers, nasal ulcers or Raynaud's phenomenon.  She was recently seen by orthopedic surgeon for left Achilles tendinitis.  She is going to physical therapy.  Activities of Daily Living:  Patient reports morning stiffness for 1 hour.   Patient Reports nocturnal pain.  Difficulty dressing/grooming: Denies Difficulty climbing stairs: Reports Difficulty getting out of chair: Reports Difficulty using hands for taps, buttons, cutlery, and/or writing: Denies  Review of Systems  Constitutional: Positive for fatigue. Negative for night sweats, weight gain and weight loss.  HENT: Negative for mouth sores, trouble swallowing, trouble swallowing, mouth dryness and nose dryness.   Eyes: Negative for pain, redness, visual disturbance and dryness.  Respiratory: Negative for cough, shortness of breath and difficulty breathing.   Cardiovascular: Negative for chest pain, palpitations, hypertension, irregular heartbeat and swelling in legs/feet.  Gastrointestinal: Negative for blood in stool, constipation and diarrhea.  Endocrine: Negative for increased urination.  Genitourinary: Negative for vaginal dryness.  Musculoskeletal: Positive for arthralgias, joint pain, myalgias, morning stiffness and myalgias. Negative for joint swelling, muscle weakness and muscle tenderness.  Skin: Negative for color change, rash, hair loss, skin tightness, ulcers and  sensitivity to sunlight.  Allergic/Immunologic: Negative for susceptible to infections.  Neurological: Negative for dizziness, memory loss, night sweats and weakness.  Hematological: Negative for swollen glands.  Psychiatric/Behavioral: Negative for depressed mood and sleep disturbance. The patient is not nervous/anxious.     PMFS History:  Patient Active Problem List   Diagnosis Date Noted  . Prediabetes 03/21/2018  . SLE (systemic lupus erythematosus) ([place]) 03/21/2018  . Obesity 03/21/2018  . History of migraine 08/27/2017  . Class 3 severe obesity due to excess calories without serious comorbidity with body mass index (BMI) of 50.0 to 59.9 in adult ([place]) 01/27/2017  . History of total hip replacement, left 10/27/2016  . Hyperglycemia 08/25/2016  . Leukocytosis 08/13/2016  . Status post laparoscopic hysterectomy 07/06/2016  . Primary osteoarthritis of both knees 04/29/2016  . ANA positive 04/29/2016  . Primary osteoarthritis of right hip 07/10/2011    Past Medical History:  Diagnosis Date  . Arthritis    osteoarthritis  . Complication of anesthesia    bottom of feet itching  . Elevated hemoglobin A1c 2018   6.1  . Headache(784.0)    hx migraines  . Lupus ([place]) 2018   Dr.Theresa Wedel    Family History  Problem Relation Age of Onset  . Osteoarthritis Mother   . Osteoarthritis Brother   . Hypertension Brother   . Osteoarthritis Maternal Grandmother   . Arthritis Maternal Grandmother    Past Surgical History:  Procedure Laterality Date  . ABDOMINAL HYSTERECTOMY    . CESAREAN SECTION  2000  . CYSTOSCOPY N/A 07/06/2016   Procedure: CYSTOSCOPY;  Surgeon: Nunzio Cobbs, MD;  Location: [facility];  Service: Gynecology;  Laterality: N/A;  . JOINT REPLACEMENT  L   Left hip replacement  . LAPAROSCOPIC GASTRIC SLEEVE RESECTION N/A 03/21/2018   Procedure: LAPAROSCOPIC GASTRIC SLEEVE RESECTION, UPPER ENDO, ERAS Pathway;  Surgeon: [name], MD;  Location: [facility];   Service: General;  Laterality: N/A;  . OOPHORECTOMY  2007   Rt.oophorectomy   . OVARIAN CYST SURGERY  ? and 10   rt removed,cyst off lft  . TOTAL HIP ARTHROPLASTY  07/06/2011   Procedure: TOTAL HIP ARTHROPLASTY;  Surgeon: Kerin Salen, MD;  Location: [facility];  Service: Orthopedics;  Laterality: Left;  DEPUY/PINNACLE  . TOTAL LAPAROSCOPIC HYSTERECTOMY WITH SALPINGECTOMY Left 07/06/2016   Procedure: HYSTERECTOMY TOTAL LAPAROSCOPIC WITH Left SALPINGECTOMY, Suture Left Side Wall Laceration;  Surgeon: Nunzio Cobbs, MD;  Location: [facility];  Service: Gynecology;  Laterality: Left;   Social History   Social History Narrative   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[employer]      Home Situation: 90 yo daughter in 2018      Spiritual Beliefs:      Lifestyle: starting healthy low sugar diet 08/2016/considering exercising at the [employer] History  Administered Date(s) Administered  . Influenza-Unspecified 10/26/2016  . Pneumococcal Conjugate-13 11/02/2016  . Tdap 01/26/2009     Objective: Vital Signs: BP 124/77 (BP Location: Left Arm, Patient Position: Sitting, Cuff Size: Large)   Pulse 68   Resp 12   Ht '5\' 9"'$  (1.753 m)   Wt 264 lb (119.7 kg)   LMP 04/29/2016 Comment: urine preg.negative 03/15/18  BMI 38.99 kg/m    Physical Exam Vitals signs and nursing note reviewed.  Constitutional:      Appearance: She is well-developed.  HENT:     Head: Normocephalic and atraumatic.  Eyes:     Conjunctiva/sclera: Conjunctivae normal.  Neck:     Musculoskeletal: Normal range of motion.  Cardiovascular:     Rate and Rhythm: Normal rate and regular rhythm.     Heart sounds: Normal heart sounds.  Pulmonary:     Effort: Pulmonary effort is normal.     Breath sounds: Normal breath sounds.  Abdominal:     General: Bowel sounds are normal.     Palpations: Abdomen is soft.  Lymphadenopathy:     Cervical: No cervical adenopathy.  Skin:    General: Skin is warm and dry.     Capillary  Refill: Capillary refill takes less than 2 seconds.  Neurological:     Mental Status: She is alert and oriented to person, place, and time.  Psychiatric:        Behavior: Behavior normal.      Musculoskeletal Exam: C-spine thoracic and lumbar spine with good range of motion.  Shoulder joints elbow joints wrist joints with good range of motion.  She has no synovitis of her MCPs or PIP joints.  She has painful limited range of motion of her right hip joint.  Left hip joint has been replaced and is doing better with good range of motion.  She had discomfort range of motion of bilateral knee joints without any warmth swelling or effusion.  No swelling was noted over ankle joints.  CDAI Exam: CDAI Score: - Patient Global: -; Provider Global: - Swollen: -; Tender: - Joint Exam   No joint exam has been documented for this visit   There is currently no information documented on the homunculus. Go to the Rheumatology activity and complete the homunculus joint  exam.  Investigation: No additional findings.  Imaging: No results found.  Recent Labs: Lab Results  Component Value Date   WBC 8.6 05/11/2018   HGB 12.6 05/11/2018   PLT 266 05/11/2018   NA 139 05/11/2018   K 4.2 05/11/2018   CL 101 05/11/2018   CO2 22 05/11/2018   GLUCOSE 75 05/11/2018   BUN 9 05/11/2018   CREATININE 0.57 05/11/2018   BILITOT 0.3 05/11/2018   ALKPHOS 49 05/11/2018   AST 14 05/11/2018   ALT 8 05/11/2018   PROT 7.0 05/11/2018   ALBUMIN 4.1 05/11/2018   CALCIUM 9.6 05/11/2018   GFRAA 129 05/11/2018   August 30, 2018 labs which patient brought with her drawn at [facility] shows CBC normal, CMP normal, uric acid 4.67  00-  0y Speciality Comments: PLQ eye exam: 11/24/2016 Normal. Dr. Thalia Bloodgood. Follow up in 1 year.  Procedures:  No procedures performed Allergies: Monosodium glutamate, Lactose intolerance (gi), Caffeine, Hydrocodone, and Oxycodone   Assessment / Plan:     Visit  Diagnoses: Other systemic lupus erythematosus with other organ involvement ([place]) - History of positive ANA, DS DNA, elevated ESR, arthralgias, rash -patient is clinically doing better.  She continues to have some arthralgias which I believe are related to underlying osteoarthritis.  She has no synovitis on examination.  I will repeat her serology again today.  She brought a copy of her CBC and CMP done this month which was within normal limits.  Plan: Urinalysis, Routine w reflex microscopic, Anti-DNA antibody, double-stranded, C3 and C4, Sedimentation rate  High risk medication use - PLQ 200 mg po bideye exam: 10/19 Plan: She will need labs every 5 months.  Prescription refill for Plaquenil was given.  Primary osteoarthritis of both knees - Plan: She continues to have some discomfort in her knee joints.  Although she had 100 pounds weight loss since her surgery and that has helped her knee joints.  Weight loss diet and exercise was emphasized.  Primary osteoarthritis of right hip - Plan: She has limited range of motion and discomfort with range of motion.  She is planning to have right total hip replacement in October.  History of total hip replacement, left - Plan: She has good range of motion and has no discomfort with range of motion.  Vitamin D deficiency - Plan: Her last vitamin D level in April was within normal limits.  Have advised her to continue with the vitamin D supplement.  Other fatigue - Plan: She continues to have some fatigue which could be due to underlying disease and also her long hours at work.  History of migraine   Orders: Orders Placed This Encounter  Procedures  . Urinalysis, Routine w reflex microscopic  . Anti-DNA antibody, double-stranded  . C3 and C4  . Sedimentation rate   Meds ordered this encounter  Medications  . hydroxychloroquine (PLAQUENIL) 200 MG tablet    Sig: Take 1 tablet (200 mg total) by mouth 2 (two) times daily.    Dispense:  180 tablet     Refill:  0    Face-to-face time spent with patient was 25 minutes. Greater than 50% of time was spent in counseling and coordination of care.  Follow-Up Instructions: Return in about 5 months (around 02/06/2019) for SLE.   Bo Merino, MD  Note - This record has been created using Editor, commissioning.  Chart creation errors have been sought, but may not always  have been located. Such creation errors do not  reflect on  the standard of medical care.

## 2018-08-23 NOTE — Therapy (Signed)
[place], [place], [address] Phone: [phone]   Fax:  [phone]  Physical Therapy Evaluation  Patient Details  Name: Julia Monroe MRN: [id number] Date of Birth: 06-01-1970 Referring Provider (PT): Dr Frederik Pear    Encounter Date: 08/22/2018  PT End of Session - 08/23/18 0900    Visit Number  1    Number of Visits  12    Date for PT Re-Evaluation  10/04/18    Authorization Type  [facility]    PT Start Time  1600    PT Stop Time  1645    PT Time Calculation (min)  45 min    Activity Tolerance  Patient tolerated treatment well    Behavior During Therapy  [facility] for tasks assessed/performed       Past Medical History:  Diagnosis Date  . Arthritis    osteoarthritis  . Complication of anesthesia    bottom of feet itching  . Elevated hemoglobin A1c 2018   6.1  . Headache(784.0)    hx migraines  . Lupus ([place]) 2018   Dr.Deveshwar    Past Surgical History:  Procedure Laterality Date  . ABDOMINAL HYSTERECTOMY    . CESAREAN SECTION  2000  . CYSTOSCOPY N/A 07/06/2016   Procedure: CYSTOSCOPY;  Surgeon: Nunzio Cobbs, MD;  Location: [facility];  Service: Gynecology;  Laterality: N/A;  . JOINT REPLACEMENT  L   Left hip replacement  . LAPAROSCOPIC GASTRIC SLEEVE RESECTION N/A 03/21/2018   Procedure: LAPAROSCOPIC GASTRIC SLEEVE RESECTION, UPPER ENDO, ERAS Pathway;  Surgeon: [name], MD;  Location: [facility];  Service: General;  Laterality: N/A;  . OOPHORECTOMY  2007   Rt.oophorectomy   . OVARIAN CYST SURGERY  ? and 10   rt removed,cyst off lft  . TOTAL HIP ARTHROPLASTY  07/06/2011   Procedure: TOTAL HIP ARTHROPLASTY;  Surgeon: Kerin Salen, MD;  Location: [facility];  Service: Orthopedics;  Laterality: Left;  DEPUY/PINNACLE  . TOTAL LAPAROSCOPIC HYSTERECTOMY WITH SALPINGECTOMY Left 07/06/2016   Procedure: HYSTERECTOMY TOTAL LAPAROSCOPIC WITH Left SALPINGECTOMY, Suture Left Side Wall Laceration;   Surgeon: Nunzio Cobbs, MD;  Location: [facility];  Service: Gynecology;  Laterality: Left;    There were no vitals filed for this visit.   Subjective Assessment - 08/22/18 1618    Subjective  Patient has been having left achillies pain for over a year now. She has increased pain when she wakes up in the morning. She is set to havea right hip replacement and would like to have less pain in her left achillies before she has the surgery. 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    Pertinent History  left hip replacement,  right hip OA,    How long can you sit comfortably?  achillies stiffens    How long can you stand comfortably?  standing dosent bother her too much    How long can you walk comfortably?  walking improves the pain unless she goes too far.    Diagnostic tests  Nothing at this time    Currently in Pain?  Yes   over the past few days   Pain Score  5     Pain Location  Ankle    Pain Orientation  Right    Pain Descriptors / Indicators  Aching    Pain Type  Chronic pain    Pain Onset  More than a month ago  Pain Frequency  Intermittent    Aggravating Factors   in the morning    Pain Relieving Factors  rest    Effect of Pain on Daily Activities  difficulty performing daily activity         [facility] PT Assessment - 08/23/18 0001      Assessment   Medical Diagnosis  Left Achillies tendinitis     Referring Provider (PT)  Dr Frederik Pear     Hand Dominance  Right    Prior Therapy  For her left hip       Precautions   Precautions  None      Restrictions   Weight Bearing Restrictions  No      Balance Screen   Has the patient fallen in the past 6 months  No    Has the patient had a decrease in activity level because of a fear of falling?   No    Is the patient reluctant to leave their home because of a fear of falling?   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     Leisure  likes to walk; has been exerciseing losing weight for her surgery       Cognition   Overall Cognitive Status  Within Functional Limits for tasks assessed    Attention  Focused    Focused Attention  Appears intact    Memory  Appears intact    Awareness  Appears intact    Problem Solving  Appears intact    Executive Function  --      Observation/Other Assessments   Focus on Therapeutic Outcomes (FOTO)   25% limitation expected       Sensation   Light Touch  Appears Intact      Coordination   Gross Motor Movements are Fluid and Coordinated  Yes    Fine Motor Movements are Fluid and Coordinated  Yes      ROM / Strength   AROM / PROM / Strength  AROM;PROM;Strength      AROM   AROM Assessment Site  Lumbar;Ankle    Right/Left Ankle  Left;Right    Right Ankle Dorsiflexion  12    Right Ankle Plantar Flexion  50    Right Ankle Inversion  34    Right Ankle Eversion  20    Left Ankle Dorsiflexion  -3    Left Ankle Plantar Flexion  50    Left Ankle Inversion  35    Left Ankle Eversion  20    Lumbar Flexion  --    Lumbar Extension  --      PROM   PROM Assessment Site  Ankle    Right/Left Ankle  Left      Strength   Strength Assessment Site  Ankle    Right/Left Ankle  Right;Left    Right Ankle Dorsiflexion  5/5    Right Ankle Plantar Flexion  5/5    Right Ankle Inversion  5/5    Right Ankle Eversion  5/5    Left Ankle Dorsiflexion  4+/5    Left Ankle Plantar Flexion  4/5                Objective measurements completed  on examination: See above findings.      [facility] Adult PT Treatment/Exercise - 08/23/18 0001      Exercises   Exercises  Ankle      Ankle Exercises: Stretches   Other Stretch  gastroc stretch 3x20 sec hold       Ankle Exercises: Sidelying   Other Sidelying Ankle Exercises  yellow band inversion, eversion, DF x10               PT Education - 08/23/18 1220    Education Details  reviewed HEP, Symptom management,    Person(s) Educated  Patient    Methods  Explanation;Demonstration;Tactile cues;Verbal cues    Comprehension  Verbalized understanding;Returned demonstration;Verbal cues required;Tactile cues required       PT Short Term Goals - 08/23/18 0909      PT SHORT TERM GOAL #1   Title  Patient will perfrom 15 seocnd left sigle leg stance    Time  3    Period  Weeks    Status  New    Target Date  09/13/18      PT SHORT TERM GOAL #2   Title  Patient will increase gross left ankle strength to 4+/5    Time  3    Period  Weeks    Status  New    Target Date  09/13/18      PT SHORT TERM GOAL #3   Title  Patient will report 2/10 at worst when she wakes up in the morning    Time  3    Period  Weeks    Status  New    Target Date  09/13/18        PT Long Term Goals - 08/23/18 0910      PT LONG TERM GOAL #1   Title  Patient will report no pain when she wakes up in the morning    Time  6    Period  Weeks    Status  New    Target Date  10/04/18      PT LONG TERM GOAL #2   Title  Patient will be independent with a complete HEP    Time  6    Period  Weeks    Status  New    Target Date  10/04/18      PT LONG TERM GOAL #3   Title  Pateitn will demonstrate a 25% limitation on FOTO    Time  6    Period  Weeks    Status  New    Target Date  10/04/18             Plan - 08/23/18 0901    Clinical Impression Statement  Patient is a 48 year old female with left schillies pain that is worse in the morning. She has decreased active DF and mild ankle instability in weight bearing. She has tenderness to palpation in her achillies insertion and has trigger points into her calf. in a resting position her left foot sitsin more plantar flexion. She would benefit from further skilled therapy to improve ankle DF and decrease left achillies pain in the morning. She was seen for a low  complexity eval.    Personal Factors and Comorbidities  Comorbidity 1    Comorbidities  right hip OA, left hip replacement    Examination-Activity Limitations  Stairs;Sleep    Stability/Clinical Decision Making  Stable/Uncomplicated    Clinical Decision Making  Low    PT Frequency  2x / week    PT Duration  6 weeks    PT Treatment/Interventions  ADLs/Self Care Home Management;Cryotherapy;Electrical Stimulation;Iontophoresis 4mg /ml Dexamethasone;Moist Heat;Ultrasound;DME Instruction;Gait training;Stair training;Functional mobility training;Therapeutic activities;Therapeutic exercise;Patient/family education;Neuromuscular re-education;Manual techniques;Passive range of motion;Dry needling;Taping    PT Next Visit Plan  soft titssu mobilizatio to calf; ultrasound to achillies, ionto to achillies, ankle stabilization exercises. Consider standing march, step onto airiex, rockerboard    PT Home Exercise Plan  ankle 3 way yellow band    Consulted and Agree with Plan of Care  Patient       Patient will benefit from skilled therapeutic intervention in order to improve the following deficits and impairments:  Abnormal gait, Decreased activity tolerance, Pain, Decreased strength, Decreased range of motion  Visit Diagnosis: 1. Pain in left ankle and joints of left foot   2. Other abnormalities of gait and mobility        Problem List Patient Active Problem List   Diagnosis Date Noted  . Prediabetes 03/21/2018  . SLE (systemic lupus erythematosus) ([place]) 03/21/2018  . Obesity 03/21/2018  . History of migraine 08/27/2017  . Class 3 severe obesity due to excess calories without serious comorbidity with body mass index (BMI) of 50.0 to 59.9 in adult ([place]) 01/27/2017  . History of total hip replacement, left 10/27/2016  . Hyperglycemia 08/25/2016  . Leukocytosis 08/13/2016  . Status post laparoscopic hysterectomy 07/06/2016  . Primary osteoarthritis of both knees 04/29/2016  . ANA positive  04/29/2016  . Primary osteoarthritis of right hip 07/10/2011    Carney Living PT DPT  08/23/2018, 12:40 PM  [facility] [address] [place], [place], [address] Phone: [phone]   Fax:  [phone]  Name: Julia Monroe MRN: [id number] Date of Birth: March 09, 1970

## 2018-09-05 ENCOUNTER — Ambulatory Visit: Payer: 59 | Admitting: Physical Therapy

## 2018-09-05 DIAGNOSIS — M25572 Pain in left ankle and joints of left foot: Secondary | ICD-10-CM | POA: Insufficient documentation

## 2018-09-05 DIAGNOSIS — R2689 Other abnormalities of gait and mobility: Secondary | ICD-10-CM | POA: Insufficient documentation

## 2018-09-06 ENCOUNTER — Encounter: Payer: Self-pay | Admitting: Physical Therapy

## 2018-09-06 ENCOUNTER — Encounter: Payer: 59 | Attending: General Surgery | Admitting: Skilled Nursing Facility1

## 2018-09-06 ENCOUNTER — Other Ambulatory Visit: Payer: Self-pay

## 2018-09-06 ENCOUNTER — Ambulatory Visit (INDEPENDENT_AMBULATORY_CARE_PROVIDER_SITE_OTHER): Payer: 59 | Admitting: Rheumatology

## 2018-09-06 ENCOUNTER — Encounter: Payer: Self-pay | Admitting: Physician Assistant

## 2018-09-06 ENCOUNTER — Ambulatory Visit: Payer: 59 | Admitting: Physical Therapy

## 2018-09-06 VITALS — BP 124/77 | HR 68 | Resp 12 | Ht 69.0 in | Wt 264.0 lb

## 2018-09-06 DIAGNOSIS — E559 Vitamin D deficiency, unspecified: Secondary | ICD-10-CM

## 2018-09-06 DIAGNOSIS — Z8669 Personal history of other diseases of the nervous system and sense organs: Secondary | ICD-10-CM

## 2018-09-06 DIAGNOSIS — Z96642 Presence of left artificial hip joint: Secondary | ICD-10-CM | POA: Diagnosis not present

## 2018-09-06 DIAGNOSIS — Z6841 Body Mass Index (BMI) 40.0 and over, adult: Secondary | ICD-10-CM | POA: Insufficient documentation

## 2018-09-06 DIAGNOSIS — M1611 Unilateral primary osteoarthritis, right hip: Secondary | ICD-10-CM | POA: Diagnosis not present

## 2018-09-06 DIAGNOSIS — M3219 Other organ or system involvement in systemic lupus erythematosus: Secondary | ICD-10-CM | POA: Diagnosis not present

## 2018-09-06 DIAGNOSIS — R5383 Other fatigue: Secondary | ICD-10-CM

## 2018-09-06 DIAGNOSIS — Z79899 Other long term (current) drug therapy: Secondary | ICD-10-CM | POA: Diagnosis not present

## 2018-09-06 DIAGNOSIS — R2689 Other abnormalities of gait and mobility: Secondary | ICD-10-CM

## 2018-09-06 DIAGNOSIS — M170 Bilateral primary osteoarthritis of knee: Secondary | ICD-10-CM | POA: Diagnosis not present

## 2018-09-06 DIAGNOSIS — M25572 Pain in left ankle and joints of left foot: Secondary | ICD-10-CM

## 2018-09-06 MED ORDER — HYDROXYCHLOROQUINE SULFATE 200 MG PO TABS: 200.0000 mg | ORAL_TABLET | Freq: Two times a day (BID) | ORAL | 0 refills | Status: DC

## 2018-09-06 NOTE — Therapy (Signed)
[place], [place], [address] Phone: [phone]   Fax:  [phone]  Physical Therapy Treatment  Patient Details  Name: Julia Monroe MRN: [id number] Date of Birth: August 31, 1970 Referring Provider (PT): Dr Frederik Pear    Encounter Date: 09/06/2018  PT End of Session - 09/06/18 1621    Visit Number  2    Number of Visits  12    Date for PT Re-Evaluation  10/04/18    Authorization Type  [employer]    PT Start Time  1455    PT Stop Time  1536    PT Time Calculation (min)  41 min    Activity Tolerance  Patient tolerated treatment well    Behavior During Therapy  [facility] for tasks assessed/performed       Past Medical History:  Diagnosis Date  . Arthritis    osteoarthritis  . Complication of anesthesia    bottom of feet itching  . Elevated hemoglobin A1c 2018   6.1  . Headache(784.0)    hx migraines  . Lupus ([place]) 2018   Dr.Deveshwar    Past Surgical History:  Procedure Laterality Date  . ABDOMINAL HYSTERECTOMY    . CESAREAN SECTION  2000  . CYSTOSCOPY N/A 07/06/2016   Procedure: CYSTOSCOPY;  Surgeon: Nunzio Cobbs, MD;  Location: [facility];  Service: Gynecology;  Laterality: N/A;  . JOINT REPLACEMENT  L   Left hip replacement  . LAPAROSCOPIC GASTRIC SLEEVE RESECTION N/A 03/21/2018   Procedure: LAPAROSCOPIC GASTRIC SLEEVE RESECTION, UPPER ENDO, ERAS Pathway;  Surgeon: [name], MD;  Location: [facility];  Service: General;  Laterality: N/A;  . OOPHORECTOMY  2007   Rt.oophorectomy   . OVARIAN CYST SURGERY  ? and 10   rt removed,cyst off lft  . TOTAL HIP ARTHROPLASTY  07/06/2011   Procedure: TOTAL HIP ARTHROPLASTY;  Surgeon: Kerin Salen, MD;  Location: [facility];  Service: Orthopedics;  Laterality: Left;  DEPUY/PINNACLE  . TOTAL LAPAROSCOPIC HYSTERECTOMY WITH SALPINGECTOMY Left 07/06/2016   Procedure: HYSTERECTOMY TOTAL LAPAROSCOPIC WITH Left SALPINGECTOMY, Suture Left Side Wall Laceration;   Surgeon: Nunzio Cobbs, MD;  Location: [facility];  Service: Gynecology;  Laterality: Left;    There were no vitals filed for this visit.  Subjective Assessment - 09/06/18 1615    Subjective  Patient has been doing her stretches and exercises. She feels like her mpotion has improved. She is sore today becasue she has been standing all day.    Pertinent History  left hip replacement,  right hip OA,    How long can you sit comfortably?  achillies stiffens    How long can you stand comfortably?  standing dosent bother her too much    How long can you walk comfortably?  walking improves the pain unless she goes too far.    Diagnostic tests  Nothing at this time    Currently in Pain?  Yes    Pain Score  3     Pain Location  Ankle    Pain Orientation  Right    Pain Descriptors / Indicators  Aching    Pain Type  Acute pain    Pain Onset  More than a month ago    Pain Frequency  Intermittent    Aggravating Factors   in the morning    Pain Relieving Factors  rest    Effect of Pain on Daily Activities  difficulty performing daily activity  [facility] Adult PT Treatment/Exercise - 09/06/18 0001      Modalities   Modalities  Iontophoresis;Ultrasound      Ultrasound   Ultrasound Location  left achillies     Ultrasound Parameters  continuous 3.42mhz 1.5 w/cm2     Ultrasound Goals  Pain      Iontophoresis   Type of Iontophoresis  Dexamethasone    Location  left achillies     Dose  1cc     Time  slow release       Manual Therapy   Manual Therapy  Joint mobilization;Soft tissue mobilization;Passive ROM    Joint Mobilization  AP glides     Soft tissue mobilization  to achillies area     Passive ROM  into DF       Ankle Exercises: Supine   Other Supine Ankle Exercises  ankle t-band 4 way red      Ankle Exercises: Stretches   Other Stretch  gastroc stretch 3x20 sec hold              PT Education - 09/06/18 1620    Education Details   reviewed HEp and symptom mangement    Person(s) Educated  Patient    Methods  Explanation;Demonstration;Verbal cues;Tactile cues    Comprehension  Verbalized understanding;Returned demonstration;Verbal cues required;Tactile cues required       PT Short Term Goals - 08/23/18 0909      PT SHORT TERM GOAL #1   Title  Patient will perfrom 15 seocnd left sigle leg stance    Time  3    Period  Weeks    Status  New    Target Date  09/13/18      PT SHORT TERM GOAL #2   Title  Patient will increase gross left ankle strength to 4+/5    Time  3    Period  Weeks    Status  New    Target Date  09/13/18      PT SHORT TERM GOAL #3   Title  Patient will report 2/10 at worst when she wakes up in the morning    Time  3    Period  Weeks    Status  New    Target Date  09/13/18        PT Long Term Goals - 08/23/18 0910      PT LONG TERM GOAL #1   Title  Patient will report no pain when she wakes up in the morning    Time  6    Period  Weeks    Status  New    Target Date  10/04/18      PT LONG TERM GOAL #2   Title  Patient will be independent with a complete HEP    Time  6    Period  Weeks    Status  New    Target Date  10/04/18      PT LONG TERM GOAL #3   Title  Pateitn will demonstrate a 25% limitation on FOTO    Time  6    Period  Weeks    Status  New    Target Date  10/04/18            Plan - 09/06/18 1622    Clinical Impression Statement  Therapy ocused on anti-inflamatory modalities and manual therapy. The patients motion has improved significantly. She is having bery little swelling today. Therapy performed ultrasound and iontophoresis to the meidal achillies area.  The patient is very motivated.    Examination-Activity Limitations  Stairs;Sleep    Stability/Clinical Decision Making  Stable/Uncomplicated    Clinical Decision Making  Low    Rehab Potential  Poor    PT Frequency  2x / week    PT Duration  6 weeks    PT Treatment/Interventions  ADLs/Self Care Home  Management;Cryotherapy;Electrical Stimulation;Iontophoresis 4mg /ml Dexamethasone;Moist Heat;Ultrasound;DME Instruction;Gait training;Stair training;Functional mobility training;Therapeutic activities;Therapeutic exercise;Patient/family education;Neuromuscular re-education;Manual techniques;Passive range of motion;Dry needling;Taping    PT Next Visit Plan  soft titssu mobilizatio to calf; ultrasound to achillies, ionto to achillies, ankle stabilization exercises. Consider standing march, step onto airiex, rockerboard    PT Home Exercise Plan  ankle 3 way yellow band    Consulted and Agree with Plan of Care  Patient       Patient will benefit from skilled therapeutic intervention in order to improve the following deficits and impairments:  Abnormal gait, Decreased activity tolerance, Pain, Decreased strength, Decreased range of motion  Visit Diagnosis: 1. Pain in left ankle and joints of left foot   2. Other abnormalities of gait and mobility        Problem List Patient Active Problem List   Diagnosis Date Noted  . Prediabetes 03/21/2018  . SLE (systemic lupus erythematosus) ([place]) 03/21/2018  . Obesity 03/21/2018  . History of migraine 08/27/2017  . Class 3 severe obesity due to excess calories without serious comorbidity with body mass index (BMI) of 50.0 to 59.9 in adult ([place]) 01/27/2017  . History of total hip replacement, left 10/27/2016  . Hyperglycemia 08/25/2016  . Leukocytosis 08/13/2016  . Status post laparoscopic hysterectomy 07/06/2016  . Primary osteoarthritis of both knees 04/29/2016  . ANA positive 04/29/2016  . Primary osteoarthritis of right hip 07/10/2011    Carney Living PT DPT  09/06/2018, 4:32 PM  [facility] [address] [place], [place], [address] Phone: [phone]   Fax:  [phone]  Name: Julia Monroe MRN: [id number] Date of Birth: 10-01-1970

## 2018-09-07 NOTE — Progress Notes (Signed)
Follow-up visit:  Post-Operative sleeve Surgery  Medical Nutrition Therapy:  Appt start time: 6:00pm end time:  7:00pm  Primary concerns today: Post-operative Bariatric Surgery Nutrition Management 6 Month Post-Op Class  Surgery date: 03/21/2018 Surgery type: Sleeve Start weight at [facility]: 358.3 Weight today: pt declined  Information Reviewed/ Discussed During Appointment: -Review of composition scale numbers -Fluid requirements (64-100 ounces) -Protein requirements (60-80g) -Strategies for tolerating diet -Advancement of diet to include Starchy vegetables -Barriers to inclusion of new foods -Inclusion of appropriate multivitamin and calcium supplements  -Exercise recommendations   Fluid intake: adequate   Medications: See List Supplementation: appropriate   Using straws: no Drinking while eating: no Having you been chewing well: yes Chewing/swallowing difficulties: no Changes in vision: no Changes to mood/headaches: no Hair loss/Cahnges to skin/Changes to nails: no Any difficulty focusing or concentrating: no Sweating: no Dizziness/Lightheaded: no Palpitations: no  Carbonated beverages: no N/V/D/C/GAS: no Abdominal Pain: no Dumping syndrome: no  Recent physical activity:  ADL's  Progress Towards Goal(s):  In Progress  Handouts given during visit include:  Phase V diet Progression   Goals Sheet  The Benefits of Exercise are endless.....  Support Group Topics  Pt Chosen Goals: Nutrition Goals:  I will have a food choice from these 3 food groups: _____starch_________, ________protein_______, and ________veg______ 2 times a day by (specific date) ___10/10_______ I will eat 3 meals a day 5 days a week by (specific date) _____________ I will eat non-starchy vegetables at least 2 separate times in a day 7 days a week by (specific date) __10/10___   Teaching Method Utilized:  Visual Auditory Hands on  Demonstrated degree of understanding via:  Teach Back    Monitoring/Evaluation:  Dietary intake, exercise, and body weight. Follow up in 3 months for 9 month post-op visit.

## 2018-09-08 ENCOUNTER — Other Ambulatory Visit: Payer: Self-pay

## 2018-09-08 ENCOUNTER — Ambulatory Visit: Payer: 59 | Admitting: Physical Therapy

## 2018-09-08 DIAGNOSIS — Z6841 Body Mass Index (BMI) 40.0 and over, adult: Secondary | ICD-10-CM | POA: Diagnosis not present

## 2018-09-08 DIAGNOSIS — R2689 Other abnormalities of gait and mobility: Secondary | ICD-10-CM

## 2018-09-08 DIAGNOSIS — M25572 Pain in left ankle and joints of left foot: Secondary | ICD-10-CM

## 2018-09-08 LAB — C3 AND C4
C3 Complement: 123 mg/dL (ref 83–193)
C4 Complement: 47 mg/dL (ref 15–57)

## 2018-09-08 LAB — URINALYSIS, ROUTINE W REFLEX MICROSCOPIC
Bacteria, UA: NONE SEEN /HPF
Bilirubin Urine: NEGATIVE
Glucose, UA: NEGATIVE
Hgb urine dipstick: NEGATIVE
Hyaline Cast: NONE SEEN /LPF
Leukocytes,Ua: NEGATIVE
Nitrite: NEGATIVE
Specific Gravity, Urine: 1.034 (ref 1.001–1.03)
WBC, UA: NONE SEEN /HPF (ref 0–5)
pH: 5.5 (ref 5.0–8.0)

## 2018-09-08 LAB — SEDIMENTATION RATE: Sed Rate: 25 mm/h — ABNORMAL HIGH (ref 0–20)

## 2018-09-08 LAB — ANTI-DNA ANTIBODY, DOUBLE-STRANDED: ds DNA Ab: 21 IU/mL — ABNORMAL HIGH

## 2018-09-08 NOTE — Therapy (Signed)
[place], [place], [address] Phone: [phone]   Fax:  [phone]  Physical Therapy Treatment  Patient Details  Name: Julia Monroe MRN: [id number] Date of Birth: [birth year] Referring Provider (PT): Dr Frederik Pear    Encounter Date: 09/08/2018  PT End of Session - 09/08/18 2219    Visit Number  3    Number of Visits  12    Date for PT Re-Evaluation  10/04/18    Authorization Type  [facility] [employer]    PT Start Time  6314    PT Stop Time  1700    PT Time Calculation (min)  45 min    Activity Tolerance  Patient tolerated treatment well    Behavior During Therapy  [facility] for tasks assessed/performed       Past Medical History:  Diagnosis Date  . Arthritis    osteoarthritis  . Complication of anesthesia    bottom of feet itching  . Elevated hemoglobin A1c 2018   6.1  . Headache(784.0)    hx migraines  . Lupus ([place]) 2018   Dr.Deveshwar    Past Surgical History:  Procedure Laterality Date  . ABDOMINAL HYSTERECTOMY    . CESAREAN SECTION  2000  . CYSTOSCOPY N/A 07/06/2016   Procedure: CYSTOSCOPY;  Surgeon: Nunzio Cobbs, MD;  Location: [facility];  Service: Gynecology;  Laterality: N/A;  . JOINT REPLACEMENT  L   Left hip replacement  . LAPAROSCOPIC GASTRIC SLEEVE RESECTION N/A 03/21/2018   Procedure: LAPAROSCOPIC GASTRIC SLEEVE RESECTION, UPPER ENDO, ERAS Pathway;  Surgeon: [name], MD;  Location: [facility];  Service: General;  Laterality: N/A;  . OOPHORECTOMY  2007   Rt.oophorectomy   . OVARIAN CYST SURGERY  ? and 10   rt removed,cyst off lft  . TOTAL HIP ARTHROPLASTY  07/06/2011   Procedure: TOTAL HIP ARTHROPLASTY;  Surgeon: Kerin Salen, MD;  Location: [facility];  Service: Orthopedics;  Laterality: Left;  DEPUY/PINNACLE  . TOTAL LAPAROSCOPIC HYSTERECTOMY WITH SALPINGECTOMY Left 07/06/2016   Procedure: HYSTERECTOMY TOTAL LAPAROSCOPIC WITH Left SALPINGECTOMY, Suture Left Side Wall Laceration;   Surgeon: Nunzio Cobbs, MD;  Location: [facility];  Service: Gynecology;  Laterality: Left;    There were no vitals filed for this visit.  Subjective Assessment - 09/08/18 2215    Subjective  I am doing a lot better with my ankle pain, the [place] and [place] really helped    Pertinent History  left hip replacement,  right hip OA,    How long can you sit comfortably?  achillies stiffens    How long can you stand comfortably?  standing dosent bother her too much    How long can you walk comfortably?  walking improves the pain unless she goes too far.    Diagnostic tests  Nothing at this time    Currently in Pain?  Yes    Pain Score  1     Pain Location  Ankle    Pain Orientation  Right    Pain Descriptors / Indicators  Aching    Pain Onset  More than a month ago                       [facility] Adult PT Treatment/Exercise - 09/08/18 0001      Modalities   Modalities  Iontophoresis;Ultrasound      Ultrasound   Ultrasound Location  Lt achilles    Ultrasound Parameters  continuous 3.92mhz 1.5 w/cm2     Ultrasound Goals  Pain      Iontophoresis   Type of Iontophoresis  Dexamethasone    Location  left achillies     Dose  1cc     Time  slow release       Manual Therapy   Manual Therapy  Joint mobilization;Soft tissue mobilization;Passive ROM    Manual therapy comments  STM/IASTM to calf and achilles    Joint Mobilization  AP glides     Soft tissue mobilization  to achillies area and calf    Passive ROM  into DF       Ankle Exercises: Stretches   Other Stretch  gastroc stretch and soleus stretch 3x20 sec hold       Ankle Exercises: Supine   Other Supine Ankle Exercises  ankle t-band green X 15 DF, INV, EV, then blue band for eccentric plantarflexion strength X 15             PT Education - 09/08/18 2219    Education Details  rationale and addition of eccentric plantarflexion exercise    Person(s) Educated  Patient    Methods  Explanation;Demonstration     Comprehension  Verbalized understanding;Returned demonstration       PT Short Term Goals - 08/23/18 0909      PT SHORT TERM GOAL #1   Title  Patient will perfrom 15 seocnd left sigle leg stance    Time  3    Period  Weeks    Status  New    Target Date  09/13/18      PT SHORT TERM GOAL #2   Title  Patient will increase gross left ankle strength to 4+/5    Time  3    Period  Weeks    Status  New    Target Date  09/13/18      PT SHORT TERM GOAL #3   Title  Patient will report 2/10 at worst when she wakes up in the morning    Time  3    Period  Weeks    Status  New    Target Date  09/13/18        PT Long Term Goals - 08/23/18 0910      PT LONG TERM GOAL #1   Title  Patient will report no pain when she wakes up in the morning    Time  6    Period  Weeks    Status  New    Target Date  10/04/18      PT LONG TERM GOAL #2   Title  Patient will be independent with a complete HEP    Time  6    Period  Weeks    Status  New    Target Date  10/04/18      PT LONG TERM GOAL #3   Title  Pateitn will demonstrate a 25% limitation on FOTO    Time  6    Period  Weeks    Status  New    Target Date  10/04/18            Plan - 09/08/18 2221    Clinical Impression Statement  Continued modalaties as they reduced pain from last session. Added IASTM and eccentric plantarflexion today and she was given blue band to perform this at home.    Examination-Activity Limitations  Stairs;Sleep    Stability/Clinical Decision Making  Stable/Uncomplicated    Rehab  Potential  Poor    PT Frequency  2x / week    PT Duration  6 weeks    PT Treatment/Interventions  ADLs/Self Care Home Management;Cryotherapy;Electrical Stimulation;Iontophoresis 4mg /ml Dexamethasone;Moist Heat;Ultrasound;DME Instruction;Gait training;Stair training;Functional mobility training;Therapeutic activities;Therapeutic exercise;Patient/family education;Neuromuscular re-education;Manual techniques;Passive range of  motion;Dry needling;Taping    PT Next Visit Plan  soft titssu mobilizatio to calf; ultrasound to achillies, ionto to achillies, ankle stabilization exercises. Consider standing march, step onto airiex, rockerboard    PT Home Exercise Plan  ankle 3 way yellow band, eccentric PF with blue    Consulted and Agree with Plan of Care  Patient       Patient will benefit from skilled therapeutic intervention in order to improve the following deficits and impairments:  Abnormal gait, Decreased activity tolerance, Pain, Decreased strength, Decreased range of motion  Visit Diagnosis: 1. Pain in left ankle and joints of left foot   2. Other abnormalities of gait and mobility        Problem List Patient Active Problem List   Diagnosis Date Noted  . Prediabetes 03/21/2018  . SLE (systemic lupus erythematosus) ([place]) 03/21/2018  . Obesity 03/21/2018  . History of migraine 08/27/2017  . Class 3 severe obesity due to excess calories without serious comorbidity with body mass index (BMI) of 50.0 to 59.9 in adult ([place]) 01/27/2017  . History of total hip replacement, left 10/27/2016  . Hyperglycemia 08/25/2016  . Leukocytosis 08/13/2016  . Status post laparoscopic hysterectomy 07/06/2016  . Primary osteoarthritis of both knees 04/29/2016  . ANA positive 04/29/2016  . Primary osteoarthritis of right hip 07/10/2011    Silvestre Mesi 09/08/2018, 10:24 PM  [facility] [address] [place], [place], [address] Phone: [phone]   Fax:  [phone]  Name: Aqsa Sensabaugh MRN: [id number] Date of Birth: 1970-02-04

## 2018-09-08 NOTE — Progress Notes (Signed)
Patient sed rate is slightly elevated.  Her complements are normal.  She is clinically doing well.  We will continue current treatment for now.

## 2018-09-09 ENCOUNTER — Encounter

## 2018-09-13 ENCOUNTER — Ambulatory Visit: Payer: 59 | Admitting: Physical Therapy

## 2018-09-13 ENCOUNTER — Other Ambulatory Visit: Payer: Self-pay

## 2018-09-13 ENCOUNTER — Encounter: Payer: Self-pay | Admitting: Physical Therapy

## 2018-09-13 DIAGNOSIS — R2689 Other abnormalities of gait and mobility: Secondary | ICD-10-CM

## 2018-09-13 DIAGNOSIS — M25572 Pain in left ankle and joints of left foot: Secondary | ICD-10-CM

## 2018-09-13 DIAGNOSIS — Z6841 Body Mass Index (BMI) 40.0 and over, adult: Secondary | ICD-10-CM | POA: Diagnosis not present

## 2018-09-14 NOTE — Therapy (Signed)
[place], [place], [address] Phone: [phone]   Fax:  [phone]  Physical Therapy Treatment  Patient Details  Name: Julia Monroe MRN: [id number] Date of Birth: [birth year] Referring Provider (PT): Dr Frederik Pear    Encounter Date: 09/13/2018  PT End of Session - 09/14/18 1337    Visit Number  4    Number of Visits  12    Date for PT Re-Evaluation  10/04/18    Authorization Type  [facility] [employer]    PT Start Time  6144    PT Stop Time  1457    PT Time Calculation (min)  42 min    Activity Tolerance  Patient tolerated treatment well    Behavior During Therapy  [facility] for tasks assessed/performed       Past Medical History:  Diagnosis Date  . Arthritis    osteoarthritis  . Complication of anesthesia    bottom of feet itching  . Elevated hemoglobin A1c 2018   6.1  . Headache(784.0)    hx migraines  . Lupus ([place]) 2018   Dr.Deveshwar    Past Surgical History:  Procedure Laterality Date  . ABDOMINAL HYSTERECTOMY    . CESAREAN SECTION  2000  . CYSTOSCOPY N/A 07/06/2016   Procedure: CYSTOSCOPY;  Surgeon: Nunzio Cobbs, MD;  Location: [facility];  Service: Gynecology;  Laterality: N/A;  . JOINT REPLACEMENT  L   Left hip replacement  . LAPAROSCOPIC GASTRIC SLEEVE RESECTION N/A 03/21/2018   Procedure: LAPAROSCOPIC GASTRIC SLEEVE RESECTION, UPPER ENDO, ERAS Pathway;  Surgeon: [name], MD;  Location: [facility];  Service: General;  Laterality: N/A;  . OOPHORECTOMY  2007   Rt.oophorectomy   . OVARIAN CYST SURGERY  ? and 10   rt removed,cyst off lft  . TOTAL HIP ARTHROPLASTY  07/06/2011   Procedure: TOTAL HIP ARTHROPLASTY;  Surgeon: Kerin Salen, MD;  Location: [place];  Service: Orthopedics;  Laterality: Left;  DEPUY/PINNACLE  . TOTAL LAPAROSCOPIC HYSTERECTOMY WITH SALPINGECTOMY Left 07/06/2016   Procedure: HYSTERECTOMY TOTAL LAPAROSCOPIC WITH Left SALPINGECTOMY, Suture Left Side Wall Laceration;   Surgeon: Nunzio Cobbs, MD;  Location: [facility];  Service: Gynecology;  Laterality: Left;    There were no vitals filed for this visit.  Subjective Assessment - 09/13/18 1421    Subjective  Patient continues to feel like her achillies is improving. She is a little tender today.    Pertinent History  left hip replacement,  right hip OA,    How long can you sit comfortably?  achillies stiffens    How long can you stand comfortably?  standing dosent bother her too much    How long can you walk comfortably?  walking improves the pain unless she goes too far.    Diagnostic tests  Nothing at this time    Currently in Pain?  Yes    Pain Location  Ankle    Pain Orientation  Posterior    Pain Descriptors / Indicators  Aching    Pain Type  Chronic pain    Pain Onset  More than a month ago    Pain Frequency  Constant    Aggravating Factors   in the morning    Pain Relieving Factors  rest    Effect of Pain on Daily Activities  difficulty walking  [facility] Adult PT Treatment/Exercise - 09/14/18 0001      Ultrasound   Ultrasound Goals  Pain      Iontophoresis   Type of Iontophoresis  Dexamethasone    Location  left achillies     Dose  1cc     Time  slow release       Manual Therapy   Manual Therapy  Joint mobilization;Soft tissue mobilization;Passive ROM    Manual therapy comments  STM/IASTM to calf and achilles    Joint Mobilization  AP glides     Soft tissue mobilization  to achillies area and calf    Passive ROM  into DF       Ankle Exercises: Supine   Other Supine Ankle Exercises  ankle t-band green X 15 DF, INV, EV, then blue band for eccentric plantarflexion strength X 15      Ankle Exercises: Stretches   Other Stretch  gastroc stretch and soleus stretch 3x20 sec hold       Ankle Exercises: Standing   Other Standing Ankle Exercises  heel raises 2x15     Other Standing Ankle Exercises  Standing march 2x10              PT  Education - 09/14/18 1334    Education Details  reviewed HEP and symptom management    Person(s) Educated  Patient    Methods  Explanation;Demonstration;Tactile cues;Verbal cues    Comprehension  Verbalized understanding;Returned demonstration;Verbal cues required;Tactile cues required       PT Short Term Goals - 09/14/18 1343      PT SHORT TERM GOAL #1   Title  Patient will perfrom 15 seocnd left sigle leg stance    Baseline  working on standing exercises.    Time  3    Period  Weeks    Status  New    Target Date  09/13/18      PT SHORT TERM GOAL #2   Title  Patient will increase gross left ankle strength to 4+/5    Time  3    Period  Weeks    Status  On-going      PT SHORT TERM GOAL #3   Title  Patient will report 2/10 at worst when she wakes up in the morning    Time  3    Period  Weeks    Status  On-going        PT Long Term Goals - 08/23/18 0910      PT LONG TERM GOAL #1   Title  Patient will report no pain when she wakes up in the morning    Time  6    Period  Weeks    Status  New    Target Date  10/04/18      PT LONG TERM GOAL #2   Title  Patient will be independent with a complete HEP    Time  6    Period  Weeks    Status  New    Target Date  10/04/18      PT LONG TERM GOAL #3   Title  Pateitn will demonstrate a 25% limitation on FOTO    Time  6    Period  Weeks    Status  New    Target Date  10/04/18            Plan - 09/14/18 1341    Clinical Impression Statement  Patirent continues to make good progress. Per visual  inspection her range was full after stretching. She was having less tenderness to palpation.    Comorbidities  right hip OA, left hip replacement    Examination-Activity Limitations  Stairs;Sleep    Stability/Clinical Decision Making  Stable/Uncomplicated    Clinical Decision Making  Low    Rehab Potential  Poor    PT Frequency  2x / week    PT Duration  6 weeks    PT Treatment/Interventions  ADLs/Self Care Home  Management;Cryotherapy;Electrical Stimulation;Iontophoresis 4mg /ml Dexamethasone;Moist Heat;Ultrasound;DME Instruction;Gait training;Stair training;Functional mobility training;Therapeutic activities;Therapeutic exercise;Patient/family education;Neuromuscular re-education;Manual techniques;Passive range of motion;Dry needling;Taping    PT Home Exercise Plan  ankle 3 way yellow band, eccentric PF with blue    Consulted and Agree with Plan of Care  Patient       Patient will benefit from skilled therapeutic intervention in order to improve the following deficits and impairments:  Abnormal gait, Decreased activity tolerance, Pain, Decreased strength, Decreased range of motion  Visit Diagnosis: 1. Pain in left ankle and joints of left foot   2. Other abnormalities of gait and mobility        Problem List Patient Active Problem List   Diagnosis Date Noted  . Prediabetes 03/21/2018  . SLE (systemic lupus erythematosus) ([place]) 03/21/2018  . Obesity 03/21/2018  . History of migraine 08/27/2017  . Class 3 severe obesity due to excess calories without serious comorbidity with body mass index (BMI) of 50.0 to 59.9 in adult ([place]) 01/27/2017  . History of total hip replacement, left 10/27/2016  . Hyperglycemia 08/25/2016  . Leukocytosis 08/13/2016  . Status post laparoscopic hysterectomy 07/06/2016  . Primary osteoarthritis of both knees 04/29/2016  . ANA positive 04/29/2016  . Primary osteoarthritis of right hip 07/10/2011    Carney Living PT DPT  09/14/2018, 2:09 PM  [facility] [address] [place], [place], [address] Phone: [phone]   Fax:  [phone]  Name: Julia Monroe MRN: [id number] Date of Birth: 1971/01/21

## 2018-09-15 ENCOUNTER — Encounter: Payer: Self-pay | Admitting: Physical Therapy

## 2018-09-15 ENCOUNTER — Ambulatory Visit: Payer: 59 | Admitting: Physical Therapy

## 2018-09-15 ENCOUNTER — Other Ambulatory Visit: Payer: Self-pay

## 2018-09-15 DIAGNOSIS — M25572 Pain in left ankle and joints of left foot: Secondary | ICD-10-CM

## 2018-09-15 DIAGNOSIS — Z6841 Body Mass Index (BMI) 40.0 and over, adult: Secondary | ICD-10-CM | POA: Diagnosis not present

## 2018-09-15 DIAGNOSIS — R2689 Other abnormalities of gait and mobility: Secondary | ICD-10-CM

## 2018-09-16 NOTE — Therapy (Signed)
[place], [place], [address] Phone: [phone]   Fax:  [phone]  Physical Therapy Treatment  Patient Details  Name: Julia Monroe MRN: [id number] Date of Birth: October 22, 1970 Referring Provider (PT): Dr Frederik Pear    Encounter Date: 09/15/2018  PT End of Session - 09/15/18 1637    Visit Number  5    Number of Visits  12    Date for PT Re-Evaluation  10/04/18    Authorization Type  [facility] [employer]    PT Start Time  1630    PT Stop Time  1712    PT Time Calculation (min)  42 min    Activity Tolerance  Patient tolerated treatment well    Behavior During Therapy  [facility] for tasks assessed/performed       Past Medical History:  Diagnosis Date  . Arthritis    osteoarthritis  . Complication of anesthesia    bottom of feet itching  . Elevated hemoglobin A1c 2018   6.1  . Headache(784.0)    hx migraines  . Lupus ([place]) 2018   Dr.Deveshwar    Past Surgical History:  Procedure Laterality Date  . ABDOMINAL HYSTERECTOMY    . CESAREAN SECTION  2000  . CYSTOSCOPY N/A 07/06/2016   Procedure: CYSTOSCOPY;  Surgeon: Nunzio Cobbs, MD;  Location: [facility];  Service: Gynecology;  Laterality: N/A;  . JOINT REPLACEMENT  L   Left hip replacement  . LAPAROSCOPIC GASTRIC SLEEVE RESECTION N/A 03/21/2018   Procedure: LAPAROSCOPIC GASTRIC SLEEVE RESECTION, UPPER ENDO, ERAS Pathway;  Surgeon: [name], MD;  Location: [facility];  Service: General;  Laterality: N/A;  . OOPHORECTOMY  2007   Rt.oophorectomy   . OVARIAN CYST SURGERY  ? and 10   rt removed,cyst off lft  . TOTAL HIP ARTHROPLASTY  07/06/2011   Procedure: TOTAL HIP ARTHROPLASTY;  Surgeon: Kerin Salen, MD;  Location: [facility];  Service: Orthopedics;  Laterality: Left;  DEPUY/PINNACLE  . TOTAL LAPAROSCOPIC HYSTERECTOMY WITH SALPINGECTOMY Left 07/06/2016   Procedure: HYSTERECTOMY TOTAL LAPAROSCOPIC WITH Left SALPINGECTOMY, Suture Left Side Wall Laceration;   Surgeon: Nunzio Cobbs, MD;  Location: [facility];  Service: Gynecology;  Laterality: Left;    There were no vitals filed for this visit.  Subjective Assessment - 09/15/18 1633    Subjective  Patient reports her heel is just a little tender today. She had a little pain yesterday as well. She has been getting her wallking in.    Pertinent History  left hip replacement,  right hip OA,    How long can you sit comfortably?  achillies stiffens    How long can you stand comfortably?  standing dosent bother her too much    How long can you walk comfortably?  walking improves the pain unless she goes too far.    Diagnostic tests  Nothing at this time    Currently in Pain?  Yes    Pain Score  5     Pain Location  Axilla    Pain Orientation  Lower    Pain Descriptors / Indicators  Aching    Pain Type  Chronic pain    Pain Onset  More than a month ago    Pain Frequency  Constant    Aggravating Factors   in the morning    Pain Relieving Factors  rest    Effect of Pain on Daily Activities  difficulty walking  [facility] Adult PT Treatment/Exercise - 09/16/18 0001      Ultrasound   Ultrasound Goals  Pain      Iontophoresis   Type of Iontophoresis  Dexamethasone    Location  left achillies     Dose  1cc     Time  slow release       Manual Therapy   Manual Therapy  Joint mobilization;Soft tissue mobilization;Passive ROM    Manual therapy comments  STM/IASTM to calf and achilles    Joint Mobilization  AP glides     Soft tissue mobilization  to achillies area and calf    Passive ROM  into DF       Ankle Exercises: Standing   Other Standing Ankle Exercises  heel raises 2x15; step onto ther-ex 2x10 each way    Other Standing Ankle Exercises  Standing march 2x10              PT Education - 09/15/18 1636    Education Details  reviewed HEP and symptom management    Person(s) Educated  Patient    Methods  Explanation;Demonstration;Verbal  cues;Tactile cues    Comprehension  Verbalized understanding;Returned demonstration;Verbal cues required;Tactile cues required       PT Short Term Goals - 09/14/18 1343      PT SHORT TERM GOAL #1   Title  Patient will perfrom 15 seocnd left sigle leg stance    Baseline  working on standing exercises.    Time  3    Period  Weeks    Status  New    Target Date  09/13/18      PT SHORT TERM GOAL #2   Title  Patient will increase gross left ankle strength to 4+/5    Time  3    Period  Weeks    Status  On-going      PT SHORT TERM GOAL #3   Title  Patient will report 2/10 at worst when she wakes up in the morning    Time  3    Period  Weeks    Status  On-going        PT Long Term Goals - 08/23/18 0910      PT LONG TERM GOAL #1   Title  Patient will report no pain when she wakes up in the morning    Time  6    Period  Weeks    Status  New    Target Date  10/04/18      PT LONG TERM GOAL #2   Title  Patient will be independent with a complete HEP    Time  6    Period  Weeks    Status  New    Target Date  10/04/18      PT LONG TERM GOAL #3   Title  Pateitn will demonstrate a 25% limitation on FOTO    Time  6    Period  Weeks    Status  New    Target Date  10/04/18            Plan - 09/16/18 0759    Clinical Impression Statement  Despite minor soreness the patient tolerated treatment well. She had no pain after treatment and had no pain with standing exercises. She was advised that as long as her hip is hurting she is likley going to be putting extra weight on the left LE when she ambualtes. She was advised to follow her symptoms but stay  active and keep waking but dont over-do it.,    Personal Factors and Comorbidities  Comorbidity 1    Comorbidities  right hip OA, left hip replacement    Examination-Activity Limitations  Stairs;Sleep    Stability/Clinical Decision Making  Stable/Uncomplicated    Clinical Decision Making  Low    Rehab Potential  Poor    PT  Frequency  2x / week    PT Duration  6 weeks    PT Treatment/Interventions  ADLs/Self Care Home Management;Cryotherapy;Electrical Stimulation;Iontophoresis 4mg /ml Dexamethasone;Moist Heat;Ultrasound;DME Instruction;Gait training;Stair training;Functional mobility training;Therapeutic activities;Therapeutic exercise;Patient/family education;Neuromuscular re-education;Manual techniques;Passive range of motion;Dry needling;Taping    PT Next Visit Plan  soft titssu mobilizatio to calf; ultrasound to achillies, ionto to achillies, ankle stabilization exercises. Consider standing march, step onto airiex, rockerboard    PT Home Exercise Plan  ankle 3 way yellow band, eccentric PF with blue       Patient will benefit from skilled therapeutic intervention in order to improve the following deficits and impairments:  Abnormal gait, Decreased activity tolerance, Pain, Decreased strength, Decreased range of motion  Visit Diagnosis: Pain in left ankle and joints of left foot  Other abnormalities of gait and mobility     Problem List Patient Active Problem List   Diagnosis Date Noted  . Prediabetes 03/21/2018  . SLE (systemic lupus erythematosus) ([place]) 03/21/2018  . Obesity 03/21/2018  . History of migraine 08/27/2017  . Class 3 severe obesity due to excess calories without serious comorbidity with body mass index (BMI) of 50.0 to 59.9 in adult ([place]) 01/27/2017  . History of total hip replacement, left 10/27/2016  . Hyperglycemia 08/25/2016  . Leukocytosis 08/13/2016  . Status post laparoscopic hysterectomy 07/06/2016  . Primary osteoarthritis of both knees 04/29/2016  . ANA positive 04/29/2016  . Primary osteoarthritis of right hip 07/10/2011    Carney Living  PT DPT  09/16/2018, 8:54 AM  [facility] [address] [place], [place], [address] Phone: [phone]   Fax:  [phone]  Name: Julia Monroe MRN: [id number] Date of  Birth: 01-30-1970

## 2018-09-20 ENCOUNTER — Ambulatory Visit: Payer: 59 | Admitting: Physical Therapy

## 2018-09-20 ENCOUNTER — Encounter: Payer: Self-pay | Admitting: Physical Therapy

## 2018-09-20 ENCOUNTER — Other Ambulatory Visit: Payer: Self-pay

## 2018-09-20 DIAGNOSIS — M25572 Pain in left ankle and joints of left foot: Secondary | ICD-10-CM

## 2018-09-20 DIAGNOSIS — R2689 Other abnormalities of gait and mobility: Secondary | ICD-10-CM

## 2018-09-20 DIAGNOSIS — Z6841 Body Mass Index (BMI) 40.0 and over, adult: Secondary | ICD-10-CM | POA: Diagnosis not present

## 2018-09-20 NOTE — Therapy (Signed)
[place], [place], [address] Phone: [phone]   Fax:  [phone]  Physical Therapy Treatment  Patient Details  Name: Julia Monroe MRN: [id number] Date of Birth: 05-Oct-1970 Referring Provider (PT): Dr Frederik Pear    Encounter Date: 09/20/2018  PT End of Session - 09/20/18 1446    Visit Number  6    Number of Visits  12    Date for PT Re-Evaluation  10/04/18    Authorization Type  [facility]    PT Start Time  [id number]    PT Stop Time  1500    PT Time Calculation (min)  43 min    Activity Tolerance  Patient tolerated treatment well    Behavior During Therapy  [facility] for tasks assessed/performed       Past Medical History:  Diagnosis Date  . Arthritis    osteoarthritis  . Complication of anesthesia    bottom of feet itching  . Elevated hemoglobin A1c 2018   6.1  . Headache(784.0)    hx migraines  . Lupus ([place]) 2018   Dr.Deveshwar    Past Surgical History:  Procedure Laterality Date  . ABDOMINAL HYSTERECTOMY    . CESAREAN SECTION  2000  . CYSTOSCOPY N/A 07/06/2016   Procedure: CYSTOSCOPY;  Surgeon: Nunzio Cobbs, MD;  Location: [facility];  Service: Gynecology;  Laterality: N/A;  . JOINT REPLACEMENT  L   Left hip replacement  . LAPAROSCOPIC GASTRIC SLEEVE RESECTION N/A 03/21/2018   Procedure: LAPAROSCOPIC GASTRIC SLEEVE RESECTION, UPPER ENDO, ERAS Pathway;  Surgeon: [name], MD;  Location: [facility];  Service: General;  Laterality: N/A;  . OOPHORECTOMY  2007   Rt.oophorectomy   . OVARIAN CYST SURGERY  ? and 10   rt removed,cyst off lft  . TOTAL HIP ARTHROPLASTY  07/06/2011   Procedure: TOTAL HIP ARTHROPLASTY;  Surgeon: Kerin Salen, MD;  Location: [facility];  Service: Orthopedics;  Laterality: Left;  DEPUY/PINNACLE  . TOTAL LAPAROSCOPIC HYSTERECTOMY WITH SALPINGECTOMY Left 07/06/2016   Procedure: HYSTERECTOMY TOTAL LAPAROSCOPIC WITH Left SALPINGECTOMY, Suture Left Side Wall Laceration;   Surgeon: Nunzio Cobbs, MD;  Location: [facility];  Service: Gynecology;  Laterality: Left;    There were no vitals filed for this visit.  Subjective Assessment - 09/20/18 1421    Subjective  Patient reports her achillies has been pretty good. She has been doing a l ot of yard work. Her pain has been tolerable.    Pertinent History  left hip replacement,  right hip OA,    How long can you sit comfortably?  achillies stiffens    How long can you stand comfortably?  standing dosent bother her too much    How long can you walk comfortably?  walking improves the pain unless she goes too far.    Diagnostic tests  Nothing at this time    Currently in Pain?  Yes    Pain Score  3     Pain Location  --   achilies   Pain Orientation  Left    Pain Descriptors / Indicators  Aching    Pain Type  Chronic pain    Pain Onset  More than a month ago    Pain Frequency  Constant    Aggravating Factors   too much activity    Pain Relieving Factors  stretching    Effect of Pain on Daily Activities  difficulty walking  PT Education - 09/20/18 1445    Education Details  HEP and symptom mangement    Person(s) Educated  Patient    Methods  Explanation;Demonstration;Tactile cues;Verbal cues    Comprehension  Verbalized understanding;Returned demonstration;Verbal cues required;Tactile cues required       PT Short Term Goals - 09/14/18 1343      PT SHORT TERM GOAL #1   Title  Patient will perfrom 15 seocnd left sigle leg stance    Baseline  working on standing exercises.    Time  3    Period  Weeks    Status  New    Target Date  09/13/18      PT SHORT TERM GOAL #2   Title  Patient will increase gross left ankle strength to 4+/5    Time  3    Period  Weeks    Status  On-going      PT SHORT TERM GOAL #3   Title  Patient will report 2/10 at worst when she wakes up in the morning    Time  3    Period  Weeks    Status  On-going         PT Long Term Goals - 08/23/18 0910      PT LONG TERM GOAL #1   Title  Patient will report no pain when she wakes up in the morning    Time  6    Period  Weeks    Status  New    Target Date  10/04/18      PT LONG TERM GOAL #2   Title  Patient will be independent with a complete HEP    Time  6    Period  Weeks    Status  New    Target Date  10/04/18      PT LONG TERM GOAL #3   Title  Pateitn will demonstrate a 25% limitation on FOTO    Time  6    Period  Weeks    Status  New    Target Date  10/04/18            Plan - 09/20/18 1514    Personal Factors and Comorbidities  Comorbidity 1    Comorbidities  right hip OA, left hip replacement    Examination-Activity Limitations  Stairs;Sleep    Stability/Clinical Decision Making  Stable/Uncomplicated    Clinical Decision Making  Low    Rehab Potential  Poor    PT Frequency  2x / week    PT Duration  6 weeks    PT Treatment/Interventions  ADLs/Self Care Home Management;Cryotherapy;Electrical Stimulation;Iontophoresis 4mg /ml Dexamethasone;Moist Heat;Ultrasound;DME Instruction;Gait training;Stair training;Functional mobility training;Therapeutic activities;Therapeutic exercise;Patient/family education;Neuromuscular re-education;Manual techniques;Passive range of motion;Dry needling;Taping    PT Next Visit Plan  soft titssu mobilizatio to calf; ultrasound to achillies, ionto to achillies, ankle stabilization exercises. Consider standing march, step onto airiex, rockerboard    PT Home Exercise Plan  ankle 3 way yellow band, eccentric PF with blue    Consulted and Agree with Plan of Care  Patient       Patient will benefit from skilled therapeutic intervention in order to improve the following deficits and impairments:  Abnormal gait, Decreased activity tolerance, Pain, Decreased strength, Decreased range of motion  Visit Diagnosis: Pain in left ankle and joints of left foot  Other abnormalities of gait and  mobility     Problem List Patient Active Problem List   Diagnosis Date Noted  . Prediabetes  03/21/2018  . SLE (systemic lupus erythematosus) ([place]) 03/21/2018  . Obesity 03/21/2018  . History of migraine 08/27/2017  . Class 3 severe obesity due to excess calories without serious comorbidity with body mass index (BMI) of 50.0 to 59.9 in adult ([place]) 01/27/2017  . History of total hip replacement, left 10/27/2016  . Hyperglycemia 08/25/2016  . Leukocytosis 08/13/2016  . Status post laparoscopic hysterectomy 07/06/2016  . Primary osteoarthritis of both knees 04/29/2016  . ANA positive 04/29/2016  . Primary osteoarthritis of right hip 07/10/2011    Carney Living PT DPT  09/20/2018, 3:20 PM  [facility] [address] [place], [place], [address] Phone: [phone]   Fax:  [phone]  Name: Ellajean Kiehne MRN: [id number] Date of Birth: Apr 12, 1970

## 2018-09-22 ENCOUNTER — Ambulatory Visit: Payer: 59 | Admitting: Physical Therapy

## 2018-09-22 ENCOUNTER — Other Ambulatory Visit: Payer: Self-pay

## 2018-09-22 ENCOUNTER — Encounter: Payer: Self-pay | Admitting: Physical Therapy

## 2018-09-22 ENCOUNTER — Encounter: Payer: 59 | Admitting: Family Medicine

## 2018-09-22 DIAGNOSIS — Z6841 Body Mass Index (BMI) 40.0 and over, adult: Secondary | ICD-10-CM | POA: Diagnosis not present

## 2018-09-22 DIAGNOSIS — R2689 Other abnormalities of gait and mobility: Secondary | ICD-10-CM

## 2018-09-22 DIAGNOSIS — M25572 Pain in left ankle and joints of left foot: Secondary | ICD-10-CM

## 2018-09-23 ENCOUNTER — Encounter: Payer: Self-pay | Admitting: Physical Therapy

## 2018-09-23 NOTE — Therapy (Signed)
[place], [place], [address] Phone: [phone]   Fax:  [phone]  Physical Therapy Treatment  Patient Details  Name: Julia Monroe MRN: [id number] Date of Birth: [birth year] Referring Provider (PT): Dr Frederik Pear    Encounter Date: 09/22/2018  PT End of Session - 09/23/18 0859    Visit Number  7    Number of Visits  12    Date for PT Re-Evaluation  10/04/18    Authorization Type  [facility] [employer]    PT Start Time  1630    PT Stop Time  1708    PT Time Calculation (min)  38 min    Activity Tolerance  Patient tolerated treatment well    Behavior During Therapy  [facility] for tasks assessed/performed       Past Medical History:  Diagnosis Date  . Arthritis    osteoarthritis  . Complication of anesthesia    bottom of feet itching  . Elevated hemoglobin A1c 2018   6.1  . Headache(784.0)    hx migraines  . Lupus ([place]) 2018   Dr.Deveshwar    Past Surgical History:  Procedure Laterality Date  . ABDOMINAL HYSTERECTOMY    . CESAREAN SECTION  2000  . CYSTOSCOPY N/A 07/06/2016   Procedure: CYSTOSCOPY;  Surgeon: Nunzio Cobbs, MD;  Location: [facility];  Service: Gynecology;  Laterality: N/A;  . JOINT REPLACEMENT  L   Left hip replacement  . LAPAROSCOPIC GASTRIC SLEEVE RESECTION N/A 03/21/2018   Procedure: LAPAROSCOPIC GASTRIC SLEEVE RESECTION, UPPER ENDO, ERAS Pathway;  Surgeon: [name], MD;  Location: [facility];  Service: General;  Laterality: N/A;  . OOPHORECTOMY  2007   Rt.oophorectomy   . OVARIAN CYST SURGERY  ? and 10   rt removed,cyst off lft  . TOTAL HIP ARTHROPLASTY  07/06/2011   Procedure: TOTAL HIP ARTHROPLASTY;  Surgeon: Kerin Salen, MD;  Location: [facility];  Service: Orthopedics;  Laterality: Left;  DEPUY/PINNACLE  . TOTAL LAPAROSCOPIC HYSTERECTOMY WITH SALPINGECTOMY Left 07/06/2016   Procedure: HYSTERECTOMY TOTAL LAPAROSCOPIC WITH Left SALPINGECTOMY, Suture Left Side Wall Laceration;   Surgeon: Nunzio Cobbs, MD;  Location: [facility];  Service: Gynecology;  Laterality: Left;    There were no vitals filed for this visit.  Subjective Assessment - 09/23/18 0831    Subjective  Patient continues to walk and perfrom hoime activity with only minor pain in her achillies. She has her surgery scheduled. She has minor pain after work today.    Pertinent History  left hip replacement,  right hip OA,    How long can you sit comfortably?  achillies stiffens    How long can you stand comfortably?  standing dosent bother her too much    How long can you walk comfortably?  walking improves the pain unless she goes too far.    Diagnostic tests  Nothing at this time    Currently in Pain?  Yes    Pain Score  2     Pain Location  Ankle    Pain Orientation  Left    Pain Descriptors / Indicators  Aching    Pain Type  Chronic pain    Pain Onset  More than a month ago    Pain Frequency  Constant    Aggravating Factors   too much activity    Pain Relieving Factors  stretching    Effect of Pain on Daily Activities  difficulty walking  [facility] Adult PT Treatment/Exercise - 09/23/18 0001      Manual Therapy   Manual Therapy  Joint mobilization;Soft tissue mobilization;Passive ROM    Manual therapy comments  STM/IASTM to calf and achilles    Joint Mobilization  AP glides     Soft tissue mobilization  to achillies area and calf    Passive ROM  into DF       Ankle Exercises: Standing   Other Standing Ankle Exercises  heel raises 2x15; step onto air-ex 2x10 each way; step up 2x10 forward 4 inch 2x10 sideways right     Other Standing Ankle Exercises  Standing march 2x10 ;       Ankle Exercises: Supine   T-Band  4 way blue x20 each              PT Education - 09/23/18 0852    Education Details  improtance of stretching    Person(s) Educated  Patient    Methods  Explanation;Demonstration;Tactile cues;Verbal cues    Comprehension   Verbalized understanding;Returned demonstration;Verbal cues required;Tactile cues required       PT Short Term Goals - 09/14/18 1343      PT SHORT TERM GOAL #1   Title  Patient will perfrom 15 seocnd left sigle leg stance    Baseline  working on standing exercises.    Time  3    Period  Weeks    Status  New    Target Date  09/13/18      PT SHORT TERM GOAL #2   Title  Patient will increase gross left ankle strength to 4+/5    Time  3    Period  Weeks    Status  On-going      PT SHORT TERM GOAL #3   Title  Patient will report 2/10 at worst when she wakes up in the morning    Time  3    Period  Weeks    Status  On-going        PT Long Term Goals - 08/23/18 0910      PT LONG TERM GOAL #1   Title  Patient will report no pain when she wakes up in the morning    Time  6    Period  Weeks    Status  New    Target Date  10/04/18      PT LONG TERM GOAL #2   Title  Patient will be independent with a complete HEP    Time  6    Period  Weeks    Status  New    Target Date  10/04/18      PT LONG TERM GOAL #3   Title  Pateitn will demonstrate a 25% limitation on FOTO    Time  6    Period  Weeks    Status  New    Target Date  10/04/18            Plan - 09/23/18 [id number]    Clinical Impression Statement  Patient is making great progress. She tolerated stair training well. Therapy held on modalaiites today. Therapy will continue to advance exercises.    Comorbidities  right hip OA, left hip replacement    Examination-Activity Limitations  Stairs;Sleep    Stability/Clinical Decision Making  Stable/Uncomplicated    Clinical Decision Making  Low    Rehab Potential  Poor    PT Frequency  2x / week    PT Treatment/Interventions  ADLs/Self  Care Home Management;Cryotherapy;Electrical Stimulation;Iontophoresis 4mg /ml Dexamethasone;Moist Heat;Ultrasound;DME Instruction;Gait training;Stair training;Functional mobility training;Therapeutic activities;Therapeutic  exercise;Patient/family education;Neuromuscular re-education;Manual techniques;Passive range of motion;Dry needling;Taping    PT Next Visit Plan  soft titssu mobilizatio to calf; ultrasound to achillies, ionto to achillies, ankle stabilization exercises. Consider standing march, step onto airiex, rockerboard    Consulted and Agree with Plan of Care  Patient       Patient will benefit from skilled therapeutic intervention in order to improve the following deficits and impairments:  Abnormal gait, Decreased activity tolerance, Pain, Decreased strength, Decreased range of motion  Visit Diagnosis: Pain in left ankle and joints of left foot  Other abnormalities of gait and mobility     Problem List Patient Active Problem List   Diagnosis Date Noted  . Prediabetes 03/21/2018  . SLE (systemic lupus erythematosus) ([place]) 03/21/2018  . Obesity 03/21/2018  . History of migraine 08/27/2017  . Class 3 severe obesity due to excess calories without serious comorbidity with body mass index (BMI) of 50.0 to 59.9 in adult ([place]) 01/27/2017  . History of total hip replacement, left 10/27/2016  . Hyperglycemia 08/25/2016  . Leukocytosis 08/13/2016  . Status post laparoscopic hysterectomy 07/06/2016  . Primary osteoarthritis of both knees 04/29/2016  . ANA positive 04/29/2016  . Primary osteoarthritis of right hip 07/10/2011    Carney Living PT DPT  09/23/2018, 9:13 AM  [facility] [address] [place], [place], [address] Phone: [phone]   Fax:  [phone]  Name: Julia Monroe MRN: [id number] Date of Birth: 1970/04/20

## 2018-09-27 ENCOUNTER — Other Ambulatory Visit: Payer: Self-pay

## 2018-09-27 ENCOUNTER — Encounter: Payer: Self-pay | Admitting: Physical Therapy

## 2018-09-27 ENCOUNTER — Ambulatory Visit: Payer: 59 | Attending: Orthopedic Surgery | Admitting: Physical Therapy

## 2018-09-27 DIAGNOSIS — M25572 Pain in left ankle and joints of left foot: Secondary | ICD-10-CM | POA: Diagnosis not present

## 2018-09-27 DIAGNOSIS — R2689 Other abnormalities of gait and mobility: Secondary | ICD-10-CM | POA: Insufficient documentation

## 2018-09-28 ENCOUNTER — Encounter: Payer: Self-pay | Admitting: Physical Therapy

## 2018-09-28 NOTE — Therapy (Signed)
[place], [place], [address] Phone: [phone]   Fax:  [phone]  Physical Therapy Treatment  Patient Details  Name: Julia Monroe MRN: [id number] Date of Birth: 1970-10-27 Referring Provider (PT): Dr Frederik Pear    Encounter Date: 09/27/2018  PT End of Session - 09/27/18 1434    Visit Number  8    Number of Visits  12    Date for PT Re-Evaluation  10/04/18    Authorization Type  [facility]    PT Start Time  1419    PT Stop Time  1457    PT Time Calculation (min)  38 min    Activity Tolerance  Patient tolerated treatment well    Behavior During Therapy  [facility] for tasks assessed/performed       Past Medical History:  Diagnosis Date  . Arthritis    osteoarthritis  . Complication of anesthesia    bottom of feet itching  . Elevated hemoglobin A1c 2018   6.1  . Headache(784.0)    hx migraines  . Lupus ([place]) 2018   Dr.Deveshwar    Past Surgical History:  Procedure Laterality Date  . ABDOMINAL HYSTERECTOMY    . CESAREAN SECTION  2000  . CYSTOSCOPY N/A 07/06/2016   Procedure: CYSTOSCOPY;  Surgeon: Nunzio Cobbs, MD;  Location: [facility];  Service: Gynecology;  Laterality: N/A;  . JOINT REPLACEMENT  L   Left hip replacement  . LAPAROSCOPIC GASTRIC SLEEVE RESECTION N/A 03/21/2018   Procedure: LAPAROSCOPIC GASTRIC SLEEVE RESECTION, UPPER ENDO, ERAS Pathway;  Surgeon: [name], MD;  Location: [facility];  Service: General;  Laterality: N/A;  . OOPHORECTOMY  2007   Rt.oophorectomy   . OVARIAN CYST SURGERY  ? and 10   rt removed,cyst off lft  . TOTAL HIP ARTHROPLASTY  07/06/2011   Procedure: TOTAL HIP ARTHROPLASTY;  Surgeon: Kerin Salen, MD;  Location: [facility];  Service: Orthopedics;  Laterality: Left;  DEPUY/PINNACLE  . TOTAL LAPAROSCOPIC HYSTERECTOMY WITH SALPINGECTOMY Left 07/06/2016   Procedure: HYSTERECTOMY TOTAL LAPAROSCOPIC WITH Left SALPINGECTOMY, Suture Left Side Wall Laceration;   Surgeon: Nunzio Cobbs, MD;  Location: [facility];  Service: Gynecology;  Laterality: Left;    There were no vitals filed for this visit.  Subjective Assessment - 09/27/18 1431    Subjective  Patient reports no heel pain today. She did a lot more yard work over the weekend    Pertinent History  left hip replacement,  right hip OA,    How long can you sit comfortably?  achillies stiffens    How long can you stand comfortably?  standing dosent bother her too much    How long can you walk comfortably?  walking improves the pain unless she goes too far.    Diagnostic tests  Nothing at this time    Currently in Pain?  No/denies                       [facility] Adult PT Treatment/Exercise - 09/28/18 0001      Manual Therapy   Manual Therapy  Joint mobilization;Soft tissue mobilization;Passive ROM    Manual therapy comments  STM/IASTM to calf and achilles    Joint Mobilization  AP glides     Soft tissue mobilization  to achillies area and calf    Passive ROM  into DF       Ankle Exercises: Standing   Other Standing  Ankle Exercises  heel raises 2x15; step onto air-ex 2x10 each way; step up 2x10 forward 4 inch 2x10 sideways right     Other Standing Ankle Exercises  Standing march 2x10 ;       Ankle Exercises: Supine   T-Band  4 way blue x20 each     Other Supine Ankle Exercises  reviewed toltal extremity strengthening yellow hip abduction 210 yellow; quad set and glut set 2x10 5 sec hold bilateral              PT Education - 09/27/18 1433    Education Details  reviewed stretching and strengthening    Person(s) Educated  Patient    Methods  Explanation;Demonstration;Tactile cues;Verbal cues    Comprehension  Returned demonstration;Verbal cues required;Tactile cues required;Verbalized understanding       PT Short Term Goals - 09/14/18 1343      PT SHORT TERM GOAL #1   Title  Patient will perfrom 15 seocnd left sigle leg stance    Baseline  working on  standing exercises.    Time  3    Period  Weeks    Status  New    Target Date  09/13/18      PT SHORT TERM GOAL #2   Title  Patient will increase gross left ankle strength to 4+/5    Time  3    Period  Weeks    Status  On-going      PT SHORT TERM GOAL #3   Title  Patient will report 2/10 at worst when she wakes up in the morning    Time  3    Period  Weeks    Status  On-going        PT Long Term Goals - 08/23/18 0910      PT LONG TERM GOAL #1   Title  Patient will report no pain when she wakes up in the morning    Time  6    Period  Weeks    Status  New    Target Date  10/04/18      PT LONG TERM GOAL #2   Title  Patient will be independent with a complete HEP    Time  6    Period  Weeks    Status  New    Target Date  10/04/18      PT LONG TERM GOAL #3   Title  Pateitn will demonstrate a 25% limitation on FOTO    Time  6    Period  Weeks    Status  New    Target Date  10/04/18            Plan - 09/27/18 1448    Clinical Impression Statement  Patient continues to make great progress. her PROm was full. Therapy was able to advance her too a 6 inch step. Therapy anticipates D/C next visit.Therapy also reviewed some full LE strengthening for both sides to work on at home. No increased pain in her hip or knee.    Personal Factors and Comorbidities  Comorbidity 1    Comorbidities  right hip OA, left hip replacement    Examination-Activity Limitations  Stairs;Sleep    Stability/Clinical Decision Making  Stable/Uncomplicated    Clinical Decision Making  Low    Rehab Potential  Poor    PT Frequency  2x / week    PT Duration  6 weeks    PT Treatment/Interventions  ADLs/Self Care Home Management;Cryotherapy;Electrical Stimulation;Iontophoresis  4mg /ml Dexamethasone;Moist Heat;Ultrasound;DME Instruction;Gait training;Stair training;Functional mobility training;Therapeutic activities;Therapeutic exercise;Patient/family education;Neuromuscular re-education;Manual  techniques;Passive range of motion;Dry needling;Taping    PT Next Visit Plan  soft titssu mobilizatio to calf; ultrasound to achillies, ionto to achillies, ankle stabilization exercises. Consider standing march, step onto airiex, rockerboard    PT Home Exercise Plan  ankle 3 way yellow band, eccentric PF with blue    Consulted and Agree with Plan of Care  Patient       Patient will benefit from skilled therapeutic intervention in order to improve the following deficits and impairments:  Abnormal gait, Decreased activity tolerance, Pain, Decreased strength, Decreased range of motion  Visit Diagnosis: Pain in left ankle and joints of left foot  Other abnormalities of gait and mobility     Problem List Patient Active Problem List   Diagnosis Date Noted  . Prediabetes 03/21/2018  . SLE (systemic lupus erythematosus) ([place]) 03/21/2018  . Obesity 03/21/2018  . History of migraine 08/27/2017  . Class 3 severe obesity due to excess calories without serious comorbidity with body mass index (BMI) of 50.0 to 59.9 in adult ([place]) 01/27/2017  . History of total hip replacement, left 10/27/2016  . Hyperglycemia 08/25/2016  . Leukocytosis 08/13/2016  . Status post laparoscopic hysterectomy 07/06/2016  . Primary osteoarthritis of both knees 04/29/2016  . ANA positive 04/29/2016  . Primary osteoarthritis of right hip 07/10/2011    Carney Living PT DPT  09/28/2018, 10:59 AM  [facility] [address] [place], [place], [address] Phone: [phone]   Fax:  [phone]  Name: Julia Monroe MRN: [id number] Date of Birth: [birth year]

## 2018-09-29 ENCOUNTER — Ambulatory Visit: Payer: 59 | Admitting: Physical Therapy

## 2018-09-29 ENCOUNTER — Encounter: Payer: Self-pay | Admitting: Physical Therapy

## 2018-09-29 ENCOUNTER — Other Ambulatory Visit: Payer: Self-pay

## 2018-09-29 DIAGNOSIS — R2689 Other abnormalities of gait and mobility: Secondary | ICD-10-CM

## 2018-09-29 DIAGNOSIS — E669 Obesity, unspecified: Secondary | ICD-10-CM | POA: Diagnosis not present

## 2018-09-29 DIAGNOSIS — M25572 Pain in left ankle and joints of left foot: Secondary | ICD-10-CM

## 2018-09-29 DIAGNOSIS — Z9884 Bariatric surgery status: Secondary | ICD-10-CM | POA: Diagnosis not present

## 2018-09-29 DIAGNOSIS — R7303 Prediabetes: Secondary | ICD-10-CM | POA: Diagnosis not present

## 2018-09-30 ENCOUNTER — Encounter: Payer: Self-pay | Admitting: Physical Therapy

## 2018-09-30 NOTE — Therapy (Signed)
[facility] [address] [place], [place], [address] Phone: [phone]   Fax:  [phone]  Physical Therapy Treatment/ Discharge   Patient Details  Name: Julia Monroe MRN: [id number] Date of Birth: 11/23/1970 Referring Provider (PT): Dr Frank Rowan    Encounter Date: 09/29/2018  PT End of Session - 09/30/18 0816    Visit Number  9    Number of Visits  12    Date for PT Re-Evaluation  10/04/18    Authorization Type  [facility]    PT Start Time  1626    PT Stop Time  1705    PT Time Calculation (min)  39 min    Activity Tolerance  Patient tolerated treatment well    Behavior During Therapy  [facility] for tasks assessed/performed       Past Medical History:  Diagnosis Date  . Arthritis    osteoarthritis  . Complication of anesthesia    bottom of feet itching  . Elevated hemoglobin A1c 2018   6.1  . Headache(784.0)    hx migraines  . Lupus ([facility]) 2018   Dr.Deveshwar    Past Surgical History:  Procedure Laterality Date  . ABDOMINAL HYSTERECTOMY    . CESAREAN SECTION  2000  . CYSTOSCOPY N/A 07/06/2016   Procedure: CYSTOSCOPY;  Surgeon: Amundson C Silva, Brook E, MD;  Location: [facility];  Service: Gynecology;  Laterality: N/A;  . JOINT REPLACEMENT  L   Left hip replacement  . LAPAROSCOPIC GASTRIC SLEEVE RESECTION N/A 03/21/2018   Procedure: LAPAROSCOPIC GASTRIC SLEEVE RESECTION, UPPER ENDO, ERAS Pathway;  Surgeon: Wilson, Eric, MD;  Location: [facility];  Service: General;  Laterality: N/A;  . OOPHORECTOMY  2007   Rt.oophorectomy   . OVARIAN CYST SURGERY  ? and 10   rt removed,cyst off lft  . TOTAL HIP ARTHROPLASTY  07/06/2011   Procedure: TOTAL HIP ARTHROPLASTY;  Surgeon: Frank J Rowan, MD;  [facility];  Service: Orthopedics;  Laterality: Left;  DEPUY/PINNACLE  . TOTAL LAPAROSCOPIC HYSTERECTOMY WITH SALPINGECTOMY Left 07/06/2016   Procedure: HYSTERECTOMY TOTAL LAPAROSCOPIC WITH Left SALPINGECTOMY, Suture Left Side Wall  Laceration;  Surgeon: Amundson C Silva, Brook E, MD;  Location: [facility];  Service: Gynecology;  Laterality: Left;    There were no vitals filed for this visit.  Subjective Assessment - 09/30/18 0814    Subjective  Patient had no complanints. She was a little sore yesterday but was able to stretch. She feels comfortabel with her HEP.    Pertinent History  left hip replacement,  right hip OA,    How long can you sit comfortably?  achillies stiffens    How long can you stand comfortably?  standing dosent bother her too much    How long can you walk comfortably?  walking improves the pain unless she goes too far.    Diagnostic tests  Nothing at this time    Currently in Pain?  No/denies    Pain Onset  More than a month ago    Pain Frequency  Constant    Aggravating Factors   to much activity    Pain Relieving Factors  strertching    Effect of Pain on Daily Activities  difficulty                       [facility] Adult PT Treatment/Exercise - 09/30/18 0001      Self-Care   Self-Care  Other Self-Care Comments    Other   Self-Care Comments   reviewed what to do if her pain returns. Reviewed activity progression at home.,       Ankle Exercises: Standing   Other Standing Ankle Exercises  heel raises 2x15; step onto air-ex 2x10 each way; step up 2x10 forward 4 inch 2x10 sideways right     Other Standing Ankle Exercises  Standing march 2x10 ;       Ankle Exercises: Supine   T-Band  4 way blue x20 each     Other Supine Ankle Exercises  reviewed toltal extremity strengthening yellow hip abduction 210 yellow; quad set and glut set 2x10 5 sec hold bilateral       Ankle Exercises: Stretches   Other Stretch  reviewed calf stretching              PT Education - 09/30/18 0816    Education Details  reviewed HEp and symptom management    Person(s) Educated  Patient    Methods  Explanation;Demonstration;Tactile cues;Verbal cues    Comprehension  Verbalized understanding;Returned  demonstration;Verbal cues required;Tactile cues required       PT Short Term Goals - 09/30/18 0917      PT SHORT TERM GOAL #1   Title  Patient will perfrom 15 seocnd left sigle leg stance    Baseline  15-20 seconds without difficulty    Time  3    Period  Weeks    Status  Achieved    Target Date  09/13/18      PT SHORT TERM GOAL #2   Title  Patient will increase gross left ankle strength to 4+/5    Baseline  5/5    Time  3    Period  Weeks    Status  Achieved      PT SHORT TERM GOAL #3   Title  Patient will report 2/10 at worst when she wakes up in the morning    Baseline  minor pain at times    Time  3    Period  Weeks    Status  Achieved        PT Long Term Goals - 09/30/18 0918      PT LONG TERM GOAL #1   Title  Patient will report no pain when she wakes up in the morning    Baseline  minor pain at times    Time  6    Period  Weeks    Status  Partially Met      PT LONG TERM GOAL #2   Title  Patient will be independent with a complete HEP    Baseline  perfroming daily    Time  6    Period  Weeks    Status  Achieved            Plan - 09/30/18 0817    Clinical Impression Statement  Patient has made excellent progress. She has very little pain the coerlates with her pain. She has reached all of her goals for therapy. She is able to ambualte for exercises. She has full strength and ROM.    Personal Factors and Comorbidities  Comorbidity 1    Comorbidities  right hip OA, left hip replacement    Examination-Activity Limitations  Stairs;Sleep    Stability/Clinical Decision Making  Stable/Uncomplicated    Clinical Decision Making  Low    Rehab Potential  Poor    PT Frequency  2x / week    PT Duration  6 weeks      PT Treatment/Interventions  ADLs/Self Care Home Management;Cryotherapy;Electrical Stimulation;Iontophoresis 36m/ml Dexamethasone;Moist Heat;Ultrasound;DME Instruction;Gait training;Stair training;Functional mobility training;Therapeutic  activities;Therapeutic exercise;Patient/family education;Neuromuscular re-education;Manual techniques;Passive range of motion;Dry needling;Taping    PT Next Visit Plan  soft titssu mobilizatio to calf; ultrasound to achillies, ionto to achillies, ankle stabilization exercises. Consider standing march, step onto airiex, rockerboard    PT Home Exercise Plan  ankle 3 way yellow band, eccentric PF with blue    Consulted and Agree with Plan of Care  Patient       Patient will benefit from skilled therapeutic intervention in order to improve the following deficits and impairments:  Abnormal gait, Decreased activity tolerance, Pain, Decreased strength, Decreased range of motion  Visit Diagnosis: Pain in left ankle and joints of left foot  Other abnormalities of gait and mobility     Problem List Patient Active Problem List   Diagnosis Date Noted  . Prediabetes 03/21/2018  . SLE ([place]ic [place]matosus) [place] 03/21/2018  . Obesity 03/21/2018  . History of migraine 08/27/2017  . Class 3 severe obesity due to excess calories without serious comorbidity with body mass index (BMI) of 50.0 to 59.9 in adult ([place] 01/27/2017  . History of total hip replacement, left 10/27/2016  . Hyperglycemia 08/25/2016  . Leukocytosis 08/13/2016  . Status post laparoscopic hysterectomy 07/06/2016  . Primary osteoarthritis of both knees 04/29/2016  . ANA positive 04/29/2016  . Primary osteoarthritis of right hip 07/10/2011   PHYSICAL THERAPY DISCHARGE SUMMARY  Visits from Start of Care: 9  Current functional level related to goals / functional outcomes: Significant improvement in pain and function    Remaining deficits: Minor pain at times in the morning    Education / Equipment: HEP  Plan: Patient agrees to discharge.  Patient goals were met. Patient is being discharged due to meeting the stated rehab goals.  ?????       DCarney LivingPT DPT  09/30/2018, 9:20 AM  [facility] [address]Phone: [phone]  Fax:  [phone] Name: JAbisola CarreroMRN: [id number] of Birth: [birth year]

## 2018-10-07 ENCOUNTER — Telehealth: Payer: Self-pay

## 2018-10-07 NOTE — Telephone Encounter (Signed)
Surgical clearance  Faxed to [facility]

## 2018-10-10 ENCOUNTER — Telehealth: Payer: Self-pay

## 2018-10-10 NOTE — Telephone Encounter (Signed)
Pt Pre-Operative form complete and faxed to [facility]

## 2018-10-11 ENCOUNTER — Other Ambulatory Visit: Payer: Self-pay | Admitting: Orthopedic Surgery

## 2018-11-14 NOTE — Patient Instructions (Addendum)
DUE TO COVID-19 ONLY ONE VISITOR IS ALLOWED TO COME WITH YOU AND STAY IN THE WAITING ROOM ONLY DURING PRE OP AND PROCEDURE. THE ONE VISITOR MAY VISIT WITH YOU IN YOUR PRIVATE ROOM DURING VISITING HOURS ONLY!!   COVID SWAB TESTING MUST BE COMPLETED ON: Thursday, Oct. 22, 2020 at  3:15PM   [address], [place] -[facility] enter pre surgical testing line (Must self quarantine after testing. Follow instructions on handout.)             Your procedure is scheduled on: Monday, Oct. 26, 2020   Report to [facility] Main  Entrance    Report to admitting at 9:30 AM   Call this number if you have problems the morning of surgery [phone]   Do not eat food:After Midnight.   May have liquids until 9:00AM day of surgery   CLEAR LIQUID DIET  Foods Allowed                                                                     Foods Excluded  Water, Black Coffee and tea, regular and decaf                             liquids that you cannot  Plain Jell-O in any flavor  (No red)                                           see through such as: Fruit ices (not with fruit pulp)                                     milk, soups, orange juice  Iced Popsicles (No red)                                    All solid food Carbonated beverages, regular and diet                                    Apple juices Sports drinks like Gatorade (No red) Lightly seasoned clear broth or consume(fat free) Sugar, honey syrup  Sample Menu Breakfast                                Lunch                                     Supper Cranberry juice                    Beef broth                            Chicken broth Jell-O  Grape juice                           Apple juice Coffee or tea                        Jell-O                                      Popsicle                                                Coffee or tea                        Coffee or  tea   Complete one G2 drink the morning of surgery at 9:00AM the day of surgery.   Brush your teeth the morning of surgery.   Do NOT smoke after Midnight   Take these medicines the morning of surgery with A SIP OF WATER: None              You may not have any metal on your body including hair pins, jewelry, and body piercings             Do not wear make-up, lotions, powders, perfumes/cologne, or deodorant             Do not wear nail polish.  Do not shave  48 hours prior to surgery.               Do not bring valuables to the hospital. [facility].   Contacts, dentures or bridgework may not be worn into surgery.   Bring small overnight bag day of surgery.    Special Instructions: Bring a copy of your healthcare power of attorney and living will documents         the day of surgery if you haven't scanned them in before.              Please read over the following fact sheets you were given:  [facility] - Preparing for Surgery Before surgery, you can play an important role.  Because skin is not sterile, your skin needs to be as free of germs as possible.  You can reduce the number of germs on your skin by washing with CHG (chlorahexidine gluconate) soap before surgery.  CHG is an antiseptic cleaner which kills germs and bonds with the skin to continue killing germs even after washing. Please DO NOT use if you have an allergy to CHG or antibacterial soaps.  If your skin becomes reddened/irritated stop using the CHG and inform your nurse when you arrive at Short Stay. Do not shave (including legs and underarms) for at least 48 hours prior to the first CHG shower.  You may shave your face/neck.  Please follow these instructions carefully:  1.  Shower with CHG Soap the night before surgery and the  morning of surgery.  2.  If you choose to wash your hair, wash your hair first as usual with your normal  shampoo.  3.  After you shampoo,  rinse  your hair and body thoroughly to remove the shampoo.                             4.  Use CHG as you would any other liquid soap.  You can apply chg directly to the skin and wash.  Gently with a scrungie or clean washcloth.  5.  Apply the CHG Soap to your body ONLY FROM THE NECK DOWN.   Do   not use on face/ open                           Wound or open sores. Avoid contact with eyes, ears mouth and   genitals (private parts).                       Wash face,  Genitals (private parts) with your normal soap.             6.  Wash thoroughly, paying special attention to the area where your    surgery  will be performed.  7.  Thoroughly rinse your body with warm water from the neck down.  8.  DO NOT shower/wash with your normal soap after using and rinsing off the CHG Soap.                9.  Pat yourself dry with a clean towel.            10.  Wear clean pajamas.            11.  Place clean sheets on your bed the night of your first shower and do not  sleep with pets. Day of Surgery : Do not apply any lotions/deodorants the morning of surgery.  Please wear clean clothes to the hospital/surgery center.  FAILURE TO FOLLOW THESE INSTRUCTIONS MAY RESULT IN THE CANCELLATION OF YOUR SURGERY  PATIENT SIGNATURE_________________________________  NURSE SIGNATURE__________________________________  ________________________________________________________________________   Julia Monroe  An incentive spirometer is a tool that can help keep your lungs clear and active. This tool measures how well you are filling your lungs with each breath. Taking long deep breaths may help reverse or decrease the chance of developing breathing (pulmonary) problems (especially infection) following:  A long period of time when you are unable to move or be active. BEFORE THE PROCEDURE   If the spirometer includes an indicator to show your best effort, your nurse or respiratory therapist will set it to a desired  goal.  If possible, sit up straight or lean slightly forward. Try not to slouch.  Hold the incentive spirometer in an upright position. INSTRUCTIONS FOR USE  1. Sit on the edge of your bed if possible, or sit up as far as you can in bed or on a chair. 2. Hold the incentive spirometer in an upright position. 3. Breathe out normally. 4. Place the mouthpiece in your mouth and seal your lips tightly around it. 5. Breathe in slowly and as deeply as possible, raising the piston or the ball toward the top of the column. 6. Hold your breath for 3-5 seconds or for as long as possible. Allow the piston or ball to fall to the bottom of the column. 7. Remove the mouthpiece from your mouth and breathe out normally. 8. Rest for a few seconds and repeat Steps 1 through 7 at least 10 times every 1-2 hours when  you are awake. Take your time and take a few normal breaths between deep breaths. 9. The spirometer may include an indicator to show your best effort. Use the indicator as a goal to work toward during each repetition. 10. After each set of 10 deep breaths, practice coughing to be sure your lungs are clear. If you have an incision (the cut made at the time of surgery), support your incision when coughing by placing a pillow or rolled up towels firmly against it. Once you are able to get out of bed, walk around indoors and cough well. You may stop using the incentive spirometer when instructed by your caregiver.  RISKS AND COMPLICATIONS  Take your time so you do not get dizzy or light-headed.  If you are in pain, you may need to take or ask for pain medication before doing incentive spirometry. It is harder to take a deep breath if you are having pain. AFTER USE  Rest and breathe slowly and easily.  It can be helpful to keep track of a log of your progress. Your caregiver can provide you with a simple table to help with this. If you are using the spirometer at home, follow these instructions: [place] IF:   You are having difficultly using the spirometer.  You have trouble using the spirometer as often as instructed.  Your pain medication is not giving enough relief while using the spirometer.  You develop fever of 100.5 F (38.1 C) or higher. SEEK IMMEDIATE MEDICAL CARE IF:   You cough up bloody sputum that had not been present before.  You develop fever of 102 F (38.9 C) or greater.  You develop worsening pain at or near the incision site. MAKE SURE YOU:   Understand these instructions.  Will watch your condition.  Will get help right away if you are not doing well or get worse. Document Released: 05/25/2006 Document Revised: 04/06/2011 Document Reviewed: 07/26/2006 ExitCare Patient Information 2014 ExitCare, [place].   ________________________________________________________________________  WHAT IS A BLOOD TRANSFUSION? Blood Transfusion Information  A transfusion is the replacement of blood or some of its parts. Blood is made up of multiple cells which provide different functions.  Red blood cells carry oxygen and are used for blood loss replacement.  White blood cells fight against infection.  Platelets control bleeding.  Plasma helps clot blood.  Other blood products are available for specialized needs, such as hemophilia or other clotting disorders. BEFORE THE TRANSFUSION  Who gives blood for transfusions?   Healthy volunteers who are fully evaluated to make sure their blood is safe. This is blood bank blood. Transfusion therapy is the safest it has ever been in the practice of medicine. Before blood is taken from a donor, a complete history is taken to make sure that person has no history of diseases nor engages in risky social behavior (examples are intravenous drug use or sexual activity with multiple partners). The donor's travel history is screened to minimize risk of transmitting infections, such as malaria. The donated blood is tested for  signs of infectious diseases, such as HIV and hepatitis. The blood is then tested to be sure it is compatible with you in order to minimize the chance of a transfusion reaction. If you or a relative donates blood, this is often done in anticipation of surgery and is not appropriate for emergency situations. It takes many days to process the donated blood. RISKS AND COMPLICATIONS Although transfusion therapy is very safe and saves many  lives, the main dangers of transfusion include:   Getting an infectious disease.  Developing a transfusion reaction. This is an allergic reaction to something in the blood you were given. Every precaution is taken to prevent this. The decision to have a blood transfusion has been considered carefully by your caregiver before blood is given. Blood is not given unless the benefits outweigh the risks. AFTER THE TRANSFUSION  Right after receiving a blood transfusion, you will usually feel much better and more energetic. This is especially true if your red blood cells have gotten low (anemic). The transfusion raises the level of the red blood cells which carry oxygen, and this usually causes an energy increase.  The nurse administering the transfusion will monitor you carefully for complications. HOME CARE INSTRUCTIONS  No special instructions are needed after a transfusion. You may find your energy is better. Speak with your caregiver about any limitations on activity for underlying diseases you may have. SEEK MEDICAL CARE IF:   Your condition is not improving after your transfusion.  You develop redness or irritation at the intravenous (IV) site. SEEK IMMEDIATE MEDICAL CARE IF:  Any of the following symptoms occur over the next 12 hours:  Shaking chills.  You have a temperature by mouth above 102 F (38.9 C), not controlled by medicine.  Chest, back, or muscle pain.  People around you feel you are not acting correctly or are confused.  Shortness of breath  or difficulty breathing.  Dizziness and fainting.  You get a rash or develop hives.  You have a decrease in urine output.  Your urine turns a dark color or changes to pink, red, or brown. Any of the following symptoms occur over the next 10 days:  You have a temperature by mouth above 102 F (38.9 C), not controlled by medicine.  Shortness of breath.  Weakness after normal activity.  The white part of the eye turns yellow (jaundice).  You have a decrease in the amount of urine or are urinating less often.  Your urine turns a dark color or changes to pink, red, or brown. Document Released: 01/10/2000 Document Revised: 04/06/2011 Document Reviewed: 08/29/2007 [facility] Patient Information 2014 [place], [place].  _______________________________________________________________________

## 2018-11-15 ENCOUNTER — Encounter (HOSPITAL_COMMUNITY): Payer: Self-pay

## 2018-11-15 ENCOUNTER — Other Ambulatory Visit: Payer: Self-pay

## 2018-11-15 ENCOUNTER — Encounter (HOSPITAL_COMMUNITY)
Admission: RE | Admit: 2018-11-15 | Discharge: 2018-11-15 | Disposition: A | Discharge status: Home / Self Care | Payer: 59 | Source: Ambulatory Visit | Attending: Orthopedic Surgery | Admitting: Orthopedic Surgery

## 2018-11-15 DIAGNOSIS — Z01812 Encounter for preprocedural laboratory examination: Secondary | ICD-10-CM | POA: Insufficient documentation

## 2018-11-15 DIAGNOSIS — M1611 Unilateral primary osteoarthritis, right hip: Secondary | ICD-10-CM | POA: Diagnosis not present

## 2018-11-15 HISTORY — DX: Migraine, unspecified, not intractable, without status migrainosus: G43.909

## 2018-11-15 LAB — URINALYSIS, ROUTINE W REFLEX MICROSCOPIC
Bilirubin Urine: NEGATIVE
Glucose, UA: NEGATIVE mg/dL
Hgb urine dipstick: NEGATIVE
Ketones, ur: NEGATIVE mg/dL
Leukocytes,Ua: NEGATIVE
Nitrite: NEGATIVE
Protein, ur: NEGATIVE mg/dL
Specific Gravity, Urine: 1.024 (ref 1.005–1.030)
pH: 5 (ref 5.0–8.0)

## 2018-11-15 LAB — CBC WITH DIFFERENTIAL/PLATELET
Abs Immature Granulocytes: 0.02 10*3/uL (ref 0.00–0.07)
Basophils Absolute: 0 10*3/uL (ref 0.0–0.1)
Basophils Relative: 0 %
Eosinophils Absolute: 0.1 10*3/uL (ref 0.0–0.5)
Eosinophils Relative: 1 %
HCT: 38 % (ref 36.0–46.0)
Hemoglobin: 11.9 g/dL — ABNORMAL LOW (ref 12.0–15.0)
Immature Granulocytes: 0 %
Lymphocytes Relative: 36 %
Lymphs Abs: 2.5 10*3/uL (ref 0.7–4.0)
MCH: 29.2 pg (ref 26.0–34.0)
MCHC: 31.3 g/dL (ref 30.0–36.0)
MCV: 93.4 fL (ref 80.0–100.0)
Monocytes Absolute: 0.6 10*3/uL (ref 0.1–1.0)
Monocytes Relative: 8 %
Neutro Abs: 3.9 10*3/uL (ref 1.7–7.7)
Neutrophils Relative %: 55 %
Platelets: 222 10*3/uL (ref 150–400)
RBC: 4.07 MIL/uL (ref 3.87–5.11)
RDW: 14 % (ref 11.5–15.5)
WBC: 7.1 10*3/uL (ref 4.0–10.5)
nRBC: 0 % (ref 0.0–0.2)

## 2018-11-15 LAB — BASIC METABOLIC PANEL
Anion gap: 9 (ref 5–15)
BUN: 13 mg/dL (ref 6–20)
CO2: 26 mmol/L (ref 22–32)
Calcium: 9 mg/dL (ref 8.9–10.3)
Chloride: 103 mmol/L (ref 98–111)
Creatinine, Ser: 0.49 mg/dL (ref 0.44–1.00)
GFR calc Af Amer: >60 mL/min (ref >60)
GFR calc non Af Amer: >60 mL/min (ref >60)
Glucose, Bld: 75 mg/dL (ref 70–99)
Potassium: 3.6 mmol/L (ref 3.5–5.1)
Sodium: 138 mmol/L (ref 135–145)

## 2018-11-15 LAB — HEMOGLOBIN A1C
Hgb A1c MFr Bld: 5.1 % (ref 4.8–5.6)
Mean Plasma Glucose: 99.67 mg/dL

## 2018-11-15 LAB — PROTIME-INR
INR: 1 (ref 0.8–1.2)
Prothrombin Time: 13.5 seconds (ref 11.4–15.2)

## 2018-11-15 LAB — SURGICAL PCR SCREEN
MRSA, PCR: NEGATIVE
Staphylococcus aureus: NEGATIVE

## 2018-11-15 LAB — APTT: aPTT: 31 seconds (ref 24–36)

## 2018-11-15 NOTE — Progress Notes (Signed)
PCP -  Dr. Lockie Pares Cardiologist -N/A   Chest x-ray -01/07/2018 in epic  EKG - 01/07/2018 in epic Stress Test - N/A ECHO - N/A Cardiac Cath - N/A  Sleep Study - N/A CPAP - N/A  Fasting Blood Sugar - N/A Checks Blood Sugar _N/A____ times a day  Blood Thinner Instructions:  N/A Aspirin Instructions:  N/A Last Dose:  N/A  Anesthesia review:  N/A  Patient denies shortness of breath, fever, cough and chest pain at PAT appointment   Patient verbalized understanding of instructions that were given to them at the PAT appointment. Patient was also instructed that they will need to review over the PAT instructions again at home before surgery.

## 2018-11-15 NOTE — Progress Notes (Signed)
SPOKE W/  Fleur     SCREENING SYMPTOMS OF COVID 19:   COUGH--NO  RUNNY NOSE--- NO  SORE THROAT---NO  NASAL CONGESTION----NO  SNEEZING----NO  SHORTNESS OF BREATH---NO  DIFFICULTY BREATHING---NO  TEMP >100.0 -----NO  UNEXPLAINED BODY ACHES------NO  CHILLS -------- NO  HEADACHES ---------NO  LOSS OF SMELL/ TASTE --------NO    HAVE YOU OR ANY FAMILY MEMBER TRAVELLED PAST 14 DAYS OUT OF THE   COUNTY---NO STATE----NO COUNTRY----NO  HAVE YOU OR ANY FAMILY MEMBER BEEN EXPOSED TO ANYONE WITH COVID 19? NO    

## 2018-11-17 ENCOUNTER — Other Ambulatory Visit (HOSPITAL_COMMUNITY)
Admission: RE | Admit: 2018-11-17 | Discharge: 2018-11-17 | Disposition: A | Discharge status: Home / Self Care | Payer: 59 | Source: Ambulatory Visit | Attending: Orthopedic Surgery | Admitting: Orthopedic Surgery

## 2018-11-17 DIAGNOSIS — Z01812 Encounter for preprocedural laboratory examination: Secondary | ICD-10-CM | POA: Insufficient documentation

## 2018-11-17 DIAGNOSIS — Z20828 Contact with and (suspected) exposure to other viral communicable diseases: Secondary | ICD-10-CM | POA: Diagnosis not present

## 2018-11-18 ENCOUNTER — Ambulatory Visit: Payer: 59 | Admitting: Obstetrics and Gynecology

## 2018-11-18 NOTE — H&P (Signed)
TOTAL HIP ADMISSION H&P  Patient is admitted for right total hip arthroplasty.  Subjective:  Chief Complaint: right hip pain  HPI: Julia Monroe, 48 y.o. female, has a history of pain and functional disability in the right hip(s) due to arthritis and patient has failed non-surgical conservative treatments for greater than 12 weeks to include NSAID's and/or analgesics, use of assistive devices, weight reduction as appropriate and activity modification.  Onset of symptoms was gradual starting several years ago with gradually worsening course since that time.The patient noted no past surgery on the right hip(s).  Patient currently rates pain in the right hip at 10 out of 10 with activity. Patient has night pain, worsening of pain with activity and weight bearing, trendelenberg gait, pain that interfers with activities of daily living and pain with passive range of motion. Patient has evidence of periarticular osteophytes and joint space narrowing by imaging studies. This condition presents safety issues increasing the risk of falls.    There is no current active infection.  Patient Active Problem List   Diagnosis Date Noted  . Prediabetes 03/21/2018  . SLE (systemic lupus erythematosus) ([place]) 03/21/2018  . Obesity 03/21/2018  . History of migraine 08/27/2017  . Class 3 severe obesity due to excess calories without serious comorbidity with body mass index (BMI) of 50.0 to 59.9 in adult ([place]) 01/27/2017  . History of total hip replacement, left 10/27/2016  . Hyperglycemia 08/25/2016  . Leukocytosis 08/13/2016  . Status post laparoscopic hysterectomy 07/06/2016  . Primary osteoarthritis of both knees 04/29/2016  . ANA positive 04/29/2016  . Primary osteoarthritis of right hip 07/10/2011   Past Medical History:  Diagnosis Date  . Arthritis    osteoarthritis  . Complication of anesthesia    bottom of feet itching  . Elevated hemoglobin A1c 2018   6.1  . Lupus ([place]) 2018   Dr.Deveshwar, currently in normal range  . Migraines     Past Surgical History:  Procedure Laterality Date  . ABDOMINAL HYSTERECTOMY    . CESAREAN SECTION  2000  . CYSTOSCOPY N/A 07/06/2016   Procedure: CYSTOSCOPY;  Surgeon: Nunzio Cobbs, MD;  Location: [facility];  Service: Gynecology;  Laterality: N/A;  . JOINT REPLACEMENT  L   Left hip replacement  . LAPAROSCOPIC GASTRIC SLEEVE RESECTION N/A 03/21/2018   Procedure: LAPAROSCOPIC GASTRIC SLEEVE RESECTION, UPPER ENDO, ERAS Pathway;  Surgeon: [name], MD;  Location: [facility];  Service: General;  Laterality: N/A;  . OOPHORECTOMY  2007   Rt.oophorectomy   . OVARIAN CYST SURGERY  ? and 10   rt removed,cyst off lft  . TOTAL HIP ARTHROPLASTY  07/06/2011   Procedure: TOTAL HIP ARTHROPLASTY;  Surgeon: Kerin Salen, MD;  Location: [facility];  Service: Orthopedics;  Laterality: Left;  DEPUY/PINNACLE  . TOTAL LAPAROSCOPIC HYSTERECTOMY WITH SALPINGECTOMY Left 07/06/2016   Procedure: HYSTERECTOMY TOTAL LAPAROSCOPIC WITH Left SALPINGECTOMY, Suture Left Side Wall Laceration;  Surgeon: Nunzio Cobbs, MD;  Location: [facility];  Service: Gynecology;  Laterality: Left;    No current facility-administered medications for this encounter.    Current Outpatient Medications  Medication Sig Dispense Refill Last Dose  . acetaminophen (TYLENOL) 500 MG tablet Take 1,000 mg by mouth every 6 (six) hours as needed for moderate pain.      . calcium carbonate (OS-CAL - DOSED IN MG OF ELEMENTAL CALCIUM) 1250 (500 Ca) MG tablet Take 500 mg by mouth 3 (three) times daily.     Marland Kitchen CAMBIA  50 MG PACK Take 50 mg by mouth daily as needed (migraine).   0   . Cholecalciferol (VITAMIN D) 125 MCG (5000 UT) CAPS Take 5,000 Units by mouth daily.     . diclofenac sodium (VOLTAREN) 1 % GEL Apply 3 gm to 3 large joints up to 3 times a day.Dispense 3 tubes with 3 refills. (Patient taking differently: Apply 2 g topically daily as needed (pain). ) 3 Tube 0   . fexofenadine  (ALLEGRA ALLERGY) 180 MG tablet Take 180 mg by mouth daily as needed (allergies).      . hydroxychloroquine (PLAQUENIL) 200 MG tablet Take 1 tablet (200 mg total) by mouth 2 (two) times daily. 180 tablet 0   . Menthol, Topical Analgesic, (BIOFREEZE EX) Apply 1 application topically 2 (two) times daily as needed (pain).     . Multiple Vitamins-Minerals (BARIATRIC MULTIVITAMINS/IRON PO) Take 1 tablet by mouth 2 (two) times daily.     . Tetrahydrozoline HCl (VISINE OP) Place 1 drop into both eyes 3 (three) times daily as needed (for irritated/dry eyes.).     Marland Kitchen traMADol (ULTRAM) 50 MG tablet Take 1 tablet (50 mg total) by mouth every 6 (six) hours as needed for severe pain. 15 tablet 0    Allergies  Allergen Reactions  . Monosodium Glutamate Other (See Comments)    Loses vision, headache/migraine  . Lactose Intolerance (Gi) Other (See Comments)    Gi upset  . Caffeine Other (See Comments)    Severe headache   . Hydrocodone Other (See Comments)    Headaches/migraines   . Oxycodone Other (See Comments)    Headache/Migraine    Social History   Tobacco Use  . Smoking status: Never Smoker  . Smokeless tobacco: Never Used  Substance Use Topics  . Alcohol use: No    Alcohol/week: 0.0 standard drinks    Family History  Problem Relation Age of Onset  . Osteoarthritis Mother   . Osteoarthritis Brother   . Hypertension Brother   . Osteoarthritis Maternal Grandmother   . Arthritis Maternal Grandmother      Review of Systems  Constitutional: Negative.   HENT: Negative.   Eyes: Negative.   Respiratory: Negative.   Cardiovascular: Negative.   Gastrointestinal: Negative.   Genitourinary: Negative.   Musculoskeletal: Positive for joint pain and myalgias.  Skin: Negative.   Neurological: Negative.   Endo/Heme/Allergies: Negative.   Psychiatric/Behavioral: Negative.     Objective:  Physical Exam  Constitutional: She is oriented to person, place, and time. She appears well-developed  and well-nourished.  HENT:  Head: Normocephalic and atraumatic.  Eyes: Pupils are equal, round, and reactive to light.  Neck: Normal range of motion. Neck supple.  Cardiovascular: Intact distal pulses.  Respiratory: Effort normal.  Musculoskeletal:        General: Tenderness present.  Neurological: She is alert and oriented to person, place, and time.  Skin: Skin is warm and dry.  Psychiatric: She has a normal mood and affect. Her behavior is normal. Judgment and thought content normal.    Vital signs in last 24 hours:    Labs:   Estimated body mass index is 35.44 kg/m as calculated from the following:   Height as of 11/15/18: 5\' 9"  (1.753 m).   Weight as of 11/15/18: 108.9 kg.   Imaging Review Plain radiographs demonstrate AP pelvis and crosstable lateral of the right hip shows end-stage arthritis bone-on-bone with peripheral osteophytes.     Assessment/Plan:  End stage arthritis, right hip(s)  The  patient history, physical examination, clinical judgement of the provider and imaging studies are consistent with end stage degenerative joint disease of the right hip(s) and total hip arthroplasty is deemed medically necessary. The treatment options including medical management, injection therapy, arthroscopy and arthroplasty were discussed at length. The risks and benefits of total hip arthroplasty were presented and reviewed. The risks due to aseptic loosening, infection, stiffness, dislocation/subluxation,  thromboembolic complications and other imponderables were discussed.  The patient acknowledged the explanation, agreed to proceed with the plan and consent was signed. Patient is being admitted for inpatient treatment for surgery, pain control, PT, OT, prophylactic antibiotics, VTE prophylaxis, progressive ambulation and ADL's and discharge planning.The patient is planning to be discharged home with home health services    Patient's anticipated LOS is less than 2 midnights,  meeting these requirements: - Younger than 35 - Lives within 1 hour of care - Has a competent adult at home to recover with post-op recover - NO history of  - Chronic pain requiring opiods  - Diabetes  - Coronary Artery Disease  - Heart failure  - Heart attack  - Stroke  - DVT/VTE  - Cardiac arrhythmia  - Respiratory Failure/COPD  - Renal failure  - Anemia  - Advanced Liver disease

## 2018-11-19 LAB — NOVEL CORONAVIRUS, NAA (HOSP ORDER, SEND-OUT TO REF LAB; TAT 18-24 HRS): SARS-CoV-2, NAA: NOT DETECTED

## 2018-11-20 MED ORDER — TRANEXAMIC ACID 1000 MG/10ML IV SOLN
2000.0000 mg | INTRAVENOUS | Status: DC
  Filled 2018-11-20: qty 20

## 2018-11-20 MED ORDER — BUPIVACAINE LIPOSOME 1.3 % IJ SUSP
10.0000 mL | Freq: Once | INTRAMUSCULAR | Status: DC
  Filled 2018-11-20: qty 10

## 2018-11-21 ENCOUNTER — Encounter (HOSPITAL_COMMUNITY)
Admission: RE | Disposition: A | Discharge status: Home / Self Care | Payer: Self-pay | Source: Home / Self Care | Attending: Orthopedic Surgery

## 2018-11-21 ENCOUNTER — Ambulatory Visit (HOSPITAL_COMMUNITY)
Admission: RE | Admit: 2018-11-21 | Discharge: 2018-11-22 | Disposition: A | Discharge status: Home / Self Care | Payer: 59 | Attending: Orthopedic Surgery | Admitting: Orthopedic Surgery

## 2018-11-21 ENCOUNTER — Ambulatory Visit (HOSPITAL_COMMUNITY): Payer: 59

## 2018-11-21 ENCOUNTER — Ambulatory Visit (HOSPITAL_COMMUNITY): Payer: 59 | Admitting: Anesthesiology

## 2018-11-21 ENCOUNTER — Encounter (HOSPITAL_COMMUNITY): Payer: Self-pay | Admitting: Anesthesiology

## 2018-11-21 ENCOUNTER — Other Ambulatory Visit: Payer: Self-pay

## 2018-11-21 DIAGNOSIS — R7303 Prediabetes: Secondary | ICD-10-CM | POA: Diagnosis not present

## 2018-11-21 DIAGNOSIS — G43909 Migraine, unspecified, not intractable, without status migrainosus: Secondary | ICD-10-CM | POA: Diagnosis not present

## 2018-11-21 DIAGNOSIS — Z8249 Family history of ischemic heart disease and other diseases of the circulatory system: Secondary | ICD-10-CM | POA: Diagnosis not present

## 2018-11-21 DIAGNOSIS — Z6835 Body mass index (BMI) 35.0-35.9, adult: Secondary | ICD-10-CM | POA: Diagnosis not present

## 2018-11-21 DIAGNOSIS — Z79899 Other long term (current) drug therapy: Secondary | ICD-10-CM | POA: Insufficient documentation

## 2018-11-21 DIAGNOSIS — Z96642 Presence of left artificial hip joint: Secondary | ICD-10-CM | POA: Insufficient documentation

## 2018-11-21 DIAGNOSIS — M170 Bilateral primary osteoarthritis of knee: Secondary | ICD-10-CM | POA: Insufficient documentation

## 2018-11-21 DIAGNOSIS — Z01818 Encounter for other preprocedural examination: Secondary | ICD-10-CM

## 2018-11-21 DIAGNOSIS — Z96641 Presence of right artificial hip joint: Secondary | ICD-10-CM | POA: Diagnosis not present

## 2018-11-21 DIAGNOSIS — D72829 Elevated white blood cell count, unspecified: Secondary | ICD-10-CM | POA: Diagnosis not present

## 2018-11-21 DIAGNOSIS — M1611 Unilateral primary osteoarthritis, right hip: Secondary | ICD-10-CM | POA: Diagnosis not present

## 2018-11-21 DIAGNOSIS — Z9884 Bariatric surgery status: Secondary | ICD-10-CM | POA: Insufficient documentation

## 2018-11-21 DIAGNOSIS — M329 Systemic lupus erythematosus, unspecified: Secondary | ICD-10-CM | POA: Diagnosis not present

## 2018-11-21 DIAGNOSIS — Z419 Encounter for procedure for purposes other than remedying health state, unspecified: Secondary | ICD-10-CM

## 2018-11-21 DIAGNOSIS — Z471 Aftercare following joint replacement surgery: Secondary | ICD-10-CM | POA: Diagnosis not present

## 2018-11-21 HISTORY — PX: TOTAL HIP ARTHROPLASTY: SHX124

## 2018-11-21 LAB — TYPE AND SCREEN
ABO/RH(D): B POS
Antibody Screen: NEGATIVE

## 2018-11-21 SURGERY — ARTHROPLASTY, HIP, TOTAL, ANTERIOR APPROACH
Anesthesia: Spinal | Site: Hip | Laterality: Right

## 2018-11-21 MED ORDER — DIPHENHYDRAMINE HCL 12.5 MG/5ML PO ELIX: 12.5000 mg | ORAL_SOLUTION | ORAL | Status: DC | PRN

## 2018-11-21 MED ORDER — CELECOXIB 200 MG PO CAPS
200.0000 mg | ORAL_CAPSULE | Freq: Two times a day (BID) | ORAL | Status: DC
  Administered 2018-11-22: 200 mg via ORAL
  Filled 2018-11-21 (×2): qty 1

## 2018-11-21 MED ORDER — SODIUM CHLORIDE (PF) 0.9 % IJ SOLN
INTRAMUSCULAR | Status: AC
  Filled 2018-11-21: qty 50

## 2018-11-21 MED ORDER — GABAPENTIN 300 MG PO CAPS
300.0000 mg | ORAL_CAPSULE | Freq: Three times a day (TID) | ORAL | Status: DC
  Administered 2018-11-21 – 2018-11-22 (×3): 300 mg via ORAL
  Filled 2018-11-21 (×3): qty 1

## 2018-11-21 MED ORDER — METOCLOPRAMIDE HCL 5 MG PO TABS: 5.0000 mg | ORAL_TABLET | Freq: Three times a day (TID) | ORAL | Status: DC | PRN

## 2018-11-21 MED ORDER — CHLORHEXIDINE GLUCONATE 4 % EX LIQD: 60.0000 mL | Freq: Once | CUTANEOUS | Status: DC

## 2018-11-21 MED ORDER — DICLOFENAC POTASSIUM(MIGRAINE) 50 MG PO PACK: 50.0000 mg | PACK | Freq: Every day | ORAL | Status: DC | PRN

## 2018-11-21 MED ORDER — HYDROMORPHONE HCL 2 MG PO TABS: 2.0000 mg | ORAL_TABLET | ORAL | 0 refills | Status: DC | PRN

## 2018-11-21 MED ORDER — DOCUSATE SODIUM 100 MG PO CAPS
100.0000 mg | ORAL_CAPSULE | Freq: Two times a day (BID) | ORAL | Status: DC
  Administered 2018-11-21 – 2018-11-22 (×2): 100 mg via ORAL
  Filled 2018-11-21 (×2): qty 1

## 2018-11-21 MED ORDER — PANTOPRAZOLE SODIUM 40 MG PO TBEC
40.0000 mg | DELAYED_RELEASE_TABLET | Freq: Every day | ORAL | Status: DC
  Filled 2018-11-21: qty 1

## 2018-11-21 MED ORDER — MENTHOL 3 MG MT LOZG
1.0000 | LOZENGE | OROMUCOSAL | Status: DC | PRN
  Filled 2018-11-21: qty 9

## 2018-11-21 MED ORDER — POVIDONE-IODINE 10 % EX SWAB: 2.0000 "application " | Freq: Once | CUTANEOUS | Status: DC

## 2018-11-21 MED ORDER — POLYETHYLENE GLYCOL 3350 17 G PO PACK: 17.0000 g | PACK | Freq: Every day | ORAL | Status: DC | PRN

## 2018-11-21 MED ORDER — FENTANYL CITRATE (PF) 100 MCG/2ML IJ SOLN
INTRAMUSCULAR | Status: AC
  Filled 2018-11-21: qty 2

## 2018-11-21 MED ORDER — ALBUMIN HUMAN 5 % IV SOLN
INTRAVENOUS | Status: AC
  Filled 2018-11-21: qty 250

## 2018-11-21 MED ORDER — MIDAZOLAM HCL 2 MG/2ML IJ SOLN
INTRAMUSCULAR | Status: AC
  Filled 2018-11-21: qty 2

## 2018-11-21 MED ORDER — METHOCARBAMOL 500 MG IVPB - SIMPLE MED
500.0000 mg | Freq: Four times a day (QID) | INTRAVENOUS | Status: DC | PRN
  Administered 2018-11-21: 500 mg via INTRAVENOUS
  Filled 2018-11-21: qty 50

## 2018-11-21 MED ORDER — BUPIVACAINE LIPOSOME 1.3 % IJ SUSP
INTRAMUSCULAR | Status: DC | PRN
  Administered 2018-11-21: 10 mL

## 2018-11-21 MED ORDER — METOCLOPRAMIDE HCL 5 MG/ML IJ SOLN: 5.0000 mg | Freq: Three times a day (TID) | INTRAMUSCULAR | Status: DC | PRN

## 2018-11-21 MED ORDER — BUPIVACAINE HCL (PF) 0.25 % IJ SOLN
INTRAMUSCULAR | Status: DC | PRN
  Administered 2018-11-21: 30 mL

## 2018-11-21 MED ORDER — ACETAMINOPHEN 500 MG PO TABS
1000.0000 mg | ORAL_TABLET | Freq: Four times a day (QID) | ORAL | Status: AC
  Administered 2018-11-21 – 2018-11-22 (×4): 1000 mg via ORAL
  Filled 2018-11-21 (×4): qty 2

## 2018-11-21 MED ORDER — ONDANSETRON HCL 4 MG/2ML IJ SOLN: 4.0000 mg | Freq: Once | INTRAMUSCULAR | Status: DC | PRN

## 2018-11-21 MED ORDER — BUPIVACAINE HCL (PF) 0.25 % IJ SOLN
INTRAMUSCULAR | Status: AC
  Filled 2018-11-21: qty 30

## 2018-11-21 MED ORDER — DEXAMETHASONE SODIUM PHOSPHATE 10 MG/ML IJ SOLN
INTRAMUSCULAR | Status: DC | PRN
  Administered 2018-11-21: 10 mg via INTRAVENOUS

## 2018-11-21 MED ORDER — DEXAMETHASONE SODIUM PHOSPHATE 10 MG/ML IJ SOLN
10.0000 mg | Freq: Once | INTRAMUSCULAR | Status: AC
  Administered 2018-11-22: 10 mg via INTRAVENOUS
  Filled 2018-11-21: qty 1

## 2018-11-21 MED ORDER — FLEET ENEMA 7-19 GM/118ML RE ENEM: 1.0000 | ENEMA | Freq: Once | RECTAL | Status: DC | PRN

## 2018-11-21 MED ORDER — HYDROXYCHLOROQUINE SULFATE 200 MG PO TABS
200.0000 mg | ORAL_TABLET | Freq: Two times a day (BID) | ORAL | Status: DC
  Administered 2018-11-22: 200 mg via ORAL
  Filled 2018-11-21 (×3): qty 1

## 2018-11-21 MED ORDER — LACTATED RINGERS IV SOLN
INTRAVENOUS | Status: DC
  Administered 2018-11-21 (×3): via INTRAVENOUS

## 2018-11-21 MED ORDER — PHENOL 1.4 % MT LIQD: 1.0000 | OROMUCOSAL | Status: DC | PRN

## 2018-11-21 MED ORDER — BARIATRIC MULTIVITAMINS/IRON PO CAPS: ORAL_CAPSULE | Freq: Two times a day (BID) | ORAL | Status: DC

## 2018-11-21 MED ORDER — ONDANSETRON HCL 4 MG/2ML IJ SOLN
INTRAMUSCULAR | Status: DC | PRN
  Administered 2018-11-21: 4 mg via INTRAVENOUS

## 2018-11-21 MED ORDER — TIZANIDINE HCL 2 MG PO TABS: 2.0000 mg | ORAL_TABLET | Freq: Four times a day (QID) | ORAL | 0 refills | Status: DC | PRN

## 2018-11-21 MED ORDER — SODIUM CHLORIDE 0.9% FLUSH
INTRAVENOUS | Status: DC | PRN
  Administered 2018-11-21: 50 mL

## 2018-11-21 MED ORDER — METHOCARBAMOL 500 MG IVPB - SIMPLE MED
INTRAVENOUS | Status: AC
  Filled 2018-11-21: qty 50

## 2018-11-21 MED ORDER — PROPOFOL 500 MG/50ML IV EMUL
INTRAVENOUS | Status: AC
  Filled 2018-11-21: qty 100

## 2018-11-21 MED ORDER — HYDROMORPHONE HCL 1 MG/ML IJ SOLN
0.5000 mg | INTRAMUSCULAR | Status: DC | PRN
  Administered 2018-11-21 – 2018-11-22 (×2): 1 mg via INTRAVENOUS
  Filled 2018-11-21 (×2): qty 1

## 2018-11-21 MED ORDER — FENTANYL CITRATE (PF) 100 MCG/2ML IJ SOLN
25.0000 ug | INTRAMUSCULAR | Status: DC | PRN
  Administered 2018-11-21 (×3): 50 ug via INTRAVENOUS

## 2018-11-21 MED ORDER — DICLOFENAC SODIUM 50 MG PO TBEC
50.0000 mg | DELAYED_RELEASE_TABLET | Freq: Every day | ORAL | Status: DC | PRN
  Filled 2018-11-21: qty 1

## 2018-11-21 MED ORDER — 0.9 % SODIUM CHLORIDE (POUR BTL) OPTIME
TOPICAL | Status: DC | PRN
  Administered 2018-11-21: 1000 mL

## 2018-11-21 MED ORDER — TRANEXAMIC ACID-NACL 1000-0.7 MG/100ML-% IV SOLN
1000.0000 mg | Freq: Once | INTRAVENOUS | Status: AC
  Administered 2018-11-21: 1000 mg via INTRAVENOUS
  Filled 2018-11-21: qty 100

## 2018-11-21 MED ORDER — METHOCARBAMOL 500 MG PO TABS: 500.0000 mg | ORAL_TABLET | Freq: Four times a day (QID) | ORAL | Status: DC | PRN

## 2018-11-21 MED ORDER — ALBUMIN HUMAN 5 % IV SOLN
INTRAVENOUS | Status: DC | PRN
  Administered 2018-11-21: 13:00:00 via INTRAVENOUS

## 2018-11-21 MED ORDER — PROPOFOL 500 MG/50ML IV EMUL
INTRAVENOUS | Status: DC | PRN
  Administered 2018-11-21: 90 ug/kg/min via INTRAVENOUS

## 2018-11-21 MED ORDER — ONDANSETRON HCL 4 MG/2ML IJ SOLN: 4.0000 mg | Freq: Four times a day (QID) | INTRAMUSCULAR | Status: DC | PRN

## 2018-11-21 MED ORDER — FENTANYL CITRATE (PF) 100 MCG/2ML IJ SOLN
INTRAMUSCULAR | Status: AC
  Filled 2018-11-21: qty 4

## 2018-11-21 MED ORDER — TETRAHYDROZOLINE HCL 0.05 % OP SOLN: 1.0000 [drp] | Freq: Three times a day (TID) | OPHTHALMIC | Status: DC | PRN

## 2018-11-21 MED ORDER — TRANEXAMIC ACID 1000 MG/10ML IV SOLN
INTRAVENOUS | Status: DC | PRN
  Administered 2018-11-21: 2000 mg via TOPICAL

## 2018-11-21 MED ORDER — PROPOFOL 10 MG/ML IV BOLUS
INTRAVENOUS | Status: DC | PRN
  Administered 2018-11-21: 30 mg via INTRAVENOUS

## 2018-11-21 MED ORDER — FENTANYL CITRATE (PF) 100 MCG/2ML IJ SOLN
INTRAMUSCULAR | Status: DC | PRN
  Administered 2018-11-21 (×5): 25 ug via INTRAVENOUS
  Administered 2018-11-21: 50 ug via INTRAVENOUS
  Administered 2018-11-21: 25 ug via INTRAVENOUS

## 2018-11-21 MED ORDER — HYDROMORPHONE HCL 2 MG PO TABS
2.0000 mg | ORAL_TABLET | ORAL | Status: DC | PRN
  Administered 2018-11-22: 2 mg via ORAL
  Filled 2018-11-21: qty 1

## 2018-11-21 MED ORDER — ASPIRIN 81 MG PO CHEW
81.0000 mg | CHEWABLE_TABLET | Freq: Two times a day (BID) | ORAL | Status: DC
  Administered 2018-11-21 – 2018-11-22 (×2): 81 mg via ORAL
  Filled 2018-11-21 (×2): qty 1

## 2018-11-21 MED ORDER — MIDAZOLAM HCL 5 MG/5ML IJ SOLN
INTRAMUSCULAR | Status: DC | PRN
  Administered 2018-11-21: 1 mg via INTRAVENOUS
  Administered 2018-11-21: 2 mg via INTRAVENOUS
  Administered 2018-11-21: 1 mg via INTRAVENOUS

## 2018-11-21 MED ORDER — ALUM & MAG HYDROXIDE-SIMETH 200-200-20 MG/5ML PO SUSP: 30.0000 mL | ORAL | Status: DC | PRN

## 2018-11-21 MED ORDER — CEFAZOLIN SODIUM-DEXTROSE 2-4 GM/100ML-% IV SOLN
2.0000 g | INTRAVENOUS | Status: AC
  Administered 2018-11-21: 2 g via INTRAVENOUS
  Filled 2018-11-21: qty 100

## 2018-11-21 MED ORDER — BUPIVACAINE IN DEXTROSE 0.75-8.25 % IT SOLN
INTRATHECAL | Status: DC | PRN
  Administered 2018-11-21: 1.8 mL via INTRATHECAL

## 2018-11-21 MED ORDER — ACETAMINOPHEN 325 MG PO TABS: 325.0000 mg | ORAL_TABLET | Freq: Four times a day (QID) | ORAL | Status: DC | PRN

## 2018-11-21 MED ORDER — ASPIRIN EC 81 MG PO TBEC: 81.0000 mg | DELAYED_RELEASE_TABLET | Freq: Two times a day (BID) | ORAL | 0 refills | Status: DC

## 2018-11-21 MED ORDER — LORATADINE 10 MG PO TABS
10.0000 mg | ORAL_TABLET | Freq: Every day | ORAL | Status: DC
  Administered 2018-11-22: 10 mg via ORAL
  Filled 2018-11-21: qty 1

## 2018-11-21 MED ORDER — TRAMADOL HCL 50 MG PO TABS
50.0000 mg | ORAL_TABLET | Freq: Four times a day (QID) | ORAL | Status: DC
  Administered 2018-11-21 – 2018-11-22 (×4): 50 mg via ORAL
  Filled 2018-11-21 (×4): qty 1

## 2018-11-21 MED ORDER — TAB-A-VITE/IRON PO TABS
1.0000 | ORAL_TABLET | Freq: Two times a day (BID) | ORAL | Status: DC
  Administered 2018-11-22: 1 via ORAL
  Filled 2018-11-21 (×3): qty 1

## 2018-11-21 MED ORDER — ONDANSETRON HCL 4 MG PO TABS: 4.0000 mg | ORAL_TABLET | Freq: Four times a day (QID) | ORAL | Status: DC | PRN

## 2018-11-21 MED ORDER — KCL IN DEXTROSE-NACL 20-5-0.45 MEQ/L-%-% IV SOLN
INTRAVENOUS | Status: DC
  Administered 2018-11-21: 17:00:00 via INTRAVENOUS
  Filled 2018-11-21 (×2): qty 1000

## 2018-11-21 MED ORDER — LIDOCAINE 2% (20 MG/ML) 5 ML SYRINGE
INTRAMUSCULAR | Status: DC | PRN
  Administered 2018-11-21: 60 mg via INTRAVENOUS
  Administered 2018-11-21: 40 mg via INTRAVENOUS

## 2018-11-21 MED ORDER — BISACODYL 5 MG PO TBEC: 5.0000 mg | DELAYED_RELEASE_TABLET | Freq: Every day | ORAL | Status: DC | PRN

## 2018-11-21 MED ORDER — TRANEXAMIC ACID-NACL 1000-0.7 MG/100ML-% IV SOLN
1000.0000 mg | INTRAVENOUS | Status: AC
  Administered 2018-11-21: 1000 mg via INTRAVENOUS
  Filled 2018-11-21: qty 100

## 2018-11-21 MED FILL — ASPIRIN 81 MG TBEC: 81 | 30 days supply | Qty: 60 | Fill #0

## 2018-11-21 MED FILL — HYDROmorphone HCL 2 MG TABS: 2 | 5 days supply | Qty: 30 | Fill #0

## 2018-11-21 MED FILL — tiZANidine HCL 2 MG TABS: 2 | 15 days supply | Qty: 60 | Fill #0

## 2018-11-21 SURGICAL SUPPLY — 46 items
BAG DECANTER FOR FLEXI CONT (MISCELLANEOUS) ×2 IMPLANT
BLADE SAW SGTL 18X1.27X75 (BLADE) ×2 IMPLANT
BLADE SURG SZ10 CARB STEEL (BLADE) ×4 IMPLANT
CONT SPEC 4OZ CLIKSEAL STRL BL (MISCELLANEOUS) ×2 IMPLANT
COVER PERINEAL POST (MISCELLANEOUS) ×2 IMPLANT
COVER SURGICAL LIGHT HANDLE (MISCELLANEOUS) ×2 IMPLANT
COVER WAND RF STERILE (DRAPES) ×2 IMPLANT
CUP ACETBLR 52 OD 100 SERIES (Hips) ×1 IMPLANT
DECANTER SPIKE VIAL GLASS SM (MISCELLANEOUS) ×4 IMPLANT
DRAPE STERI IOBAN 125X83 (DRAPES) ×2 IMPLANT
DRAPE U-SHAPE 47X51 STRL (DRAPES) ×4 IMPLANT
DRSG AQUACEL AG ADV 3.5X10 (GAUZE/BANDAGES/DRESSINGS) ×2 IMPLANT
DURAPREP 26ML APPLICATOR (WOUND CARE) ×2 IMPLANT
ELECT BLADE TIP CTD 4 INCH (ELECTRODE) ×2 IMPLANT
ELECT REM PT RETURN 15FT ADLT (MISCELLANEOUS) ×2 IMPLANT
ELIMINATOR HOLE APEX DEPUY (Hips) ×1 IMPLANT
FEM STEM 12/14 TAPER SZ 4 HIP (Orthopedic Implant) ×2 IMPLANT
FEMORAL STEM 12/14 TPR SZ4 HIP (Orthopedic Implant) IMPLANT
GLOVE BIO SURGEON STRL SZ7.5 (GLOVE) ×2 IMPLANT
GLOVE BIO SURGEON STRL SZ8.5 (GLOVE) ×2 IMPLANT
GLOVE BIOGEL PI IND STRL 8 (GLOVE) ×1 IMPLANT
GLOVE BIOGEL PI IND STRL 9 (GLOVE) ×1 IMPLANT
GLOVE BIOGEL PI INDICATOR 8 (GLOVE) ×1
GLOVE BIOGEL PI INDICATOR 9 (GLOVE) ×1
GOWN STRL REUS W/TWL XL LVL3 (GOWN DISPOSABLE) ×4 IMPLANT
HEAD CERAMIC DELTA 36 PLUS 1.5 (Hips) ×1 IMPLANT
HOLDER FOLEY CATH W/STRAP (MISCELLANEOUS) ×2 IMPLANT
KIT TURNOVER KIT A (KITS) IMPLANT
LINER NEUTRAL 52X36MM PLUS 4 (Liner) ×1 IMPLANT
MANIFOLD NEPTUNE II (INSTRUMENTS) ×2 IMPLANT
NDL HYPO 21X1.5 SAFETY (NEEDLE) ×2 IMPLANT
NEEDLE HYPO 21X1.5 SAFETY (NEEDLE) ×4 IMPLANT
NS IRRIG 1000ML POUR BTL (IV SOLUTION) ×2 IMPLANT
PACK ANTERIOR HIP CUSTOM (KITS) ×2 IMPLANT
SUT ETHIBOND NAB CT1 #1 30IN (SUTURE) ×2 IMPLANT
SUT VIC AB 0 CT1 27 (SUTURE) ×2
SUT VIC AB 0 CT1 27XBRD ANBCTR (SUTURE) ×1 IMPLANT
SUT VIC AB 1 CTX 36 (SUTURE) ×2
SUT VIC AB 1 CTX36XBRD ANBCTR (SUTURE) ×1 IMPLANT
SUT VIC AB 2-0 CT1 27 (SUTURE) ×2
SUT VIC AB 2-0 CT1 TAPERPNT 27 (SUTURE) ×1 IMPLANT
SUT VIC AB 3-0 CT1 27 (SUTURE) ×2
SUT VIC AB 3-0 CT1 TAPERPNT 27 (SUTURE) ×1 IMPLANT
SYR CONTROL 10ML LL (SYRINGE) ×6 IMPLANT
TRAY FOLEY MTR SLVR 14FR STAT (SET/KITS/TRAYS/PACK) ×1 IMPLANT
YANKAUER SUCT BULB TIP 10FT TU (MISCELLANEOUS) ×2 IMPLANT

## 2018-11-21 NOTE — Transfer of Care (Signed)
Immediate Anesthesia Transfer of Care Note  Patient: Julia Monroe  Procedure(s) Performed: RIGHT TOTAL HIP ARTHROPLASTY ANTERIOR APPROACH (Right Hip)  Patient Location: PACU  Anesthesia Type:Spinal  Level of Consciousness: awake, drowsy and responds to stimulation  Airway & Oxygen Therapy: Patient Spontanous Breathing and Patient connected to face mask oxygen  Post-op Assessment: Report given to RN and Post -op Vital signs reviewed and stable  Post vital signs: Reviewed and stable  Last Vitals:  Vitals Value Taken Time  BP    Temp    Pulse    Resp    SpO2      Last Pain:  Vitals:   11/21/18 0953  TempSrc: Oral  PainSc:          Complications: No apparent anesthesia complications

## 2018-11-21 NOTE — Evaluation (Signed)
Physical Therapy Evaluation Patient Details Name: Julia Monroe MRN: [id number] DOB: 05-01-1970 Today's Date: 11/21/2018   History of Present Illness  Patient is 48 y.o. s/p Rt THA anterior approach with PMH significant for lupus, migraines, OA, and Lt THA in 2013.  Clinical Impression  Julia Monroe is a 48 y.o. female POD 0 s/p Rt THA anterior approach. Patient reports independence with mobility at baseline. Patient is now limited by functional impairments (see PT problem list below) and requires min assist/guard for transfers and gait with RW. Patient was able to ambulate ~120 feet with RW with min cues for safe use and no overt LOB. Patient instructed in exercise to facilitate ROM and circulation. Patient will benefit from continued skilled PT interventions to address impairments and progress towards PLOF. Acute PT will follow to progress mobility and stair training in preparation for safe discharge home.     Follow Up Recommendations Follow surgeon's recommendation for DC plan and follow-up therapies    Equipment Recommendations  Rolling walker with 5" wheels    Recommendations for Other Services       Precautions / Restrictions Precautions Precautions: Fall Restrictions Weight Bearing Restrictions: No      Mobility  Bed Mobility Overal bed mobility: Needs Assistance Bed Mobility: Supine to Sit     Supine to sit: HOB elevated;Supervision     General bed mobility comments: pt using bed rails, no assist requried for mobility  Transfers Overall transfer level: Needs assistance Equipment used: Rolling walker (2 wheeled) Transfers: Sit to/from Stand Sit to Stand: Min assist;From elevated surface         General transfer comment: ceus for sfae hand placement and technique with RW, assist requried to steady throughout rise  Ambulation/Gait Ambulation/Gait assistance: Min guard Gait Distance (Feet): 120 Feet Assistive device: Rolling walker (2  wheeled) Gait Pattern/deviations: Step-through pattern;Decreased stride length Gait velocity: slightly decreased   General Gait Details: cues for safe hand placement initially and to maintain safe proximity to RW, pt maintained throughout, no overt LOB noted throughout  Stairs            Wheelchair Mobility    Modified Rankin (Stroke Patients Only)       Balance Overall balance assessment: Needs assistance Sitting-balance support: No upper extremity supported;Feet supported Sitting balance-Leahy Scale: Good     Standing balance support: During functional activity;Bilateral upper extremity supported Standing balance-Leahy Scale: Fair              Pertinent Vitals/Pain Pain Assessment: 0-10 Pain Score: 1  Pain Location: Rt hip Pain Descriptors / Indicators: Aching;Sore Pain Intervention(s): Limited activity within patient's tolerance;Monitored during session;Repositioned;Ice applied    Home Living Family/patient expects to be discharged to:: Private residence Living Arrangements: Children(pt's adult daughter lives with her) Available Help at Discharge: Family Type of Home: House Home Access: Stairs to enter Entrance Stairs-Rails: None Entrance Stairs-Number of Steps: 1 step Home Layout: One level Home Equipment: Cane - single point;Walker - 2 wheels;Bedside commode;Tub bench(walker is old needs replacing)      Prior Function Level of Independence: Independent         Comments: pt is phlembotomist with [employer]     Hand Dominance   Dominant Hand: Right    Extremity/Trunk Assessment   Upper Extremity Assessment Upper Extremity Assessment: Overall WFL for tasks assessed    Lower Extremity Assessment Lower Extremity Assessment: Overall WFL for tasks assessed;RLE deficits/detail RLE Deficits / Details: pt with good LE control and activation, able to  perform heel slide with no increase in pain RLE Sensation: WNL RLE Coordination: WNL    Cervical /  Trunk Assessment Cervical / Trunk Assessment: Normal  Communication   Communication: No difficulties  Cognition Arousal/Alertness: Awake/alert Behavior During Therapy: WFL for tasks assessed/performed Overall Cognitive Status: Within Functional Limits for tasks assessed             General Comments      Exercises Total Joint Exercises Ankle Circles/Pumps: AROM;15 reps;Both;Seated Heel Slides: AROM;10 reps;Right;Seated   Assessment/Plan    PT Assessment Patient needs continued PT services  PT Problem List Decreased strength;Decreased balance;Decreased mobility;Decreased range of motion;Decreased activity tolerance;Decreased knowledge of use of DME       PT Treatment Interventions DME instruction;Functional mobility training;Balance training;Patient/family education;Modalities;Gait training;Therapeutic activities;Stair training;Therapeutic exercise    PT Goals (Current goals can be found in the Care Plan section)  Acute Rehab PT Goals Patient Stated Goal: to get back to independence and return to work PT Goal Formulation: With patient Time For Goal Achievement: 11/28/18 Potential to Achieve Goals: Good    Frequency 7X/week    AM-PAC PT "6 Clicks" Mobility  Outcome Measure Help needed turning from your back to your side while in a flat bed without using bedrails?: A Little Help needed moving from lying on your back to sitting on the side of a flat bed without using bedrails?: A Little Help needed moving to and from a bed to a chair (including a wheelchair)?: A Little Help needed standing up from a chair using your arms (e.g., wheelchair or bedside chair)?: A Little Help needed to walk in hospital room?: A Little Help needed climbing 3-5 steps with a railing? : A Little 6 Click Score: 18    End of Session Equipment Utilized During Treatment: Gait belt Activity Tolerance: Patient tolerated treatment well Patient left: in chair;with call bell/phone within reach;with  family/visitor present;with chair alarm set Nurse Communication: Mobility status PT Visit Diagnosis: Muscle weakness (generalized) (M62.81);Difficulty in walking, not elsewhere classified (R26.2)    Time: [id number] PT Time Calculation (min) (ACUTE ONLY): 25 min   Charges:   PT Evaluation $PT Eval Low Complexity: 1 Low PT Treatments $Therapeutic Exercise: 8-22 mins       Kipp Brood, PT, DPT Physical Therapist with [facility]  11/21/2018 7:39 PM

## 2018-11-21 NOTE — Anesthesia Preprocedure Evaluation (Signed)
Anesthesia Evaluation  Patient identified by MRN, date of birth, ID band Patient awake    Reviewed: Allergy & Precautions, NPO status , Patient's Chart, lab work & pertinent test results  Airway Mallampati: II  TM Distance: >3 FB Neck ROM: Full    Dental  (+) Teeth Intact, Dental Advisory Given   Pulmonary    breath sounds clear to auscultation       Cardiovascular  Rhythm:Regular Rate:Normal     Neuro/Psych    GI/Hepatic   Endo/Other    Renal/GU      Musculoskeletal   Abdominal (+) + obese,   Peds  Hematology   Anesthesia Other Findings   Reproductive/Obstetrics                             Anesthesia Physical Anesthesia Plan  ASA: III  Anesthesia Plan: Spinal   Post-op Pain Management:    Induction:   PONV Risk Score and Plan: Ondansetron and Dexamethasone  Airway Management Planned: Natural Airway and Simple Face Mask  Additional Equipment:   Intra-op Plan:   Post-operative Plan:   Informed Consent: I have reviewed the patients History and Physical, chart, labs and discussed the procedure including the risks, benefits and alternatives for the proposed anesthesia with the patient or authorized representative who has indicated his/her understanding and acceptance.     Dental advisory given  Plan Discussed with: CRNA and Anesthesiologist  Anesthesia Plan Comments:         Anesthesia Quick Evaluation

## 2018-11-21 NOTE — Op Note (Signed)
PATIENT ID:      Julia Monroe  MRN:     [id number] DOB/AGE:    May 28, 1970 / 48 y.o.  OPERATIVE REPORT   DATE OF PROCEDURE:  11/21/2018      PREOPERATIVE DIAGNOSIS:  RIGHT HIP OSTEOARTHRITIS                                                         POSTOPERATIVE DIAGNOSIS:  RIGHT HIP OSTEOARTHRITIS                                                          PROCEDURE: Anterior R total hip arthroplasty using a 52 mm DePuy Pinnacle  Cup, [employer], 0-degree polyethylene liner, a +1.5 mm x 66mm 36 head, a 4hi Depuy Actis stem  SURGEON: Kerin Salen  ASSISTANT:   Kerry Hough. [employer]  (present throughout entire procedure and necessary for timely completion of the procedure)   ANESTHESIA: Spinal, Exparel 133mg  injection BLOOD LOSS: 450 cc FLUID REPLACEMENT: 1800 cc crystalloid TRANEXAMIC ACID: 1gm IV, 2gm Topical COMPLICATIONS: none    INDICATIONS FOR PROCEDURE: A 48 y.o. year-old With  RIGHT HIP OSTEOARTHRITIS   for 3 years, x-rays show bone-on-bone arthritic changes, and osteophytes. Despite conservative measures with observation, anti-inflammatory medicine, narcotics, use of a cane, has severe unremitting pain and can ambulate only a few blocks before resting. Patient desires elective r total hip arthroplasty to decrease pain and increase function. The risks, benefits, and alternatives were discussed at length including but not limited to the risks of infection, bleeding, nerve injury, stiffness, blood clots, the need for revision surgery, cardiopulmonary complications, among others, and they were willing to proceed. Questions answered      PROCEDURE IN DETAIL: The patient was identified by armband,   received preoperative IV antibiotics in the holding area at [facility], taken to the operating room , appropriate anesthetic monitors   were attached and anesthesia was induced with the patient on the gurney. HANA boots were applied to the feet, and the patient  was  transferred to the HANA table with a peroneal post and support underneath the non-operative leg. Theoperative lower extremity was then prepped and draped in the usual sterile fashion from just above the iliac crest to the knee. And a timeout procedure was performed. Skin along incision area was injected with 10 cc of Exparel solution. We then made a 14 cm incision along the interval at the leading edge of the tensor fascia lata of starting at 2 cm lateral to the ASIS. Small bleeders in the skin and subcutaneous tissue identified and cauterized we dissected down to the fascia and made an incision in the fascia allowing [place] to elevate the fascia of the tensor muscle and exploited the interval between the rectus and the tensor fascia lata. A Cobra retractor was then placed along the superior neck of the femur. A cerebellar retractor was used to expose the interval between the tensor fascia lata and the rectus femoris.  We identified and cauterized the ascending branch of the anterior circumflex artery. A second Cobra retractor along the inferior neck  of the femur. A small Hohmann retractor was placed underneath the origin of the rectus femoris, giving [place] good medial exposure. Using Ronguers fatty tissue was removed from in front of the anterior capsule. The capsule was then incised, starting out at the superior anterior rim of the acetabulum going laterally along the anterior neck. The capsule was then teed along the neck superiorly and inferiorly. Electrocautery was used to release capsule from the anterior and medial neck of the femur to allow external rotation. Cobra retractors were then placed along the inferior and superior neck allowing [place] to perform a standard neck cut and removed the femoral head with a power corkscrew. We then placed a medium bent homan retractor in the cotyloid notch and posteriorly along the acetabular rim a narrow Cobra retractor. Exposed labral tissue and osteophytes were then removed. We  then sequentially reamed up to a 51 mm basket reamer obtaining good coverage in all quadrants, verified by C-arm imaging. Under C-arm control we then hammered into place a 52 mm Pinnacle cup in 45 of abduction and 15 of anteversion. The cup seated nicely and required no supplemental screws. We then placed a central hole Eliminator and a 0 polyethylene liner. The foot was then externally rotated to 130-140. The limb was extended and adducted to the floor, delivering the proximal femur up into the wound. A medium curved Hohmann retractor was placed over the greater trochanter and a long Homan retractor along the posterior femoral neck completing the exposure and lateralizing the femur. We then performed releases superiorly and and inferiorly of the capsule going back to the pirformis fossa superiorly and to the lesser trochanter inferiorly. We then entered the proximal femur with the box cutting offset chisel followed by, a canal sounder, the chili pepper and broaching up to a 4 broach. This seated nicely and we reamed the calcar. A trial reduction was performed with a 1.5 mm X 36 mm head.The limb lengths were excellent the hip was stable in 90 of external rotation. At this point the trial components removed and we hammered into place a # 4 hi  Offset Actis stem with Gryption coating. A + 1.5 mm x 36 head was then hammered into place. The hip was reduced and final C-arm images obtained. The wound was thoroughly irrigated with normal saline solution. We repaired the ant capsule and the tensor fascia lot a with running 0 vicryl suture. the subcutaneous tissue was closed with 2-0 and 3-0 Vicryl suture followed by an Aquacil dressing. At this point the patient was awaken and transferred to hospital gurney without difficulty.   Kerin Salen 11/21/2018, 1:56 PM

## 2018-11-21 NOTE — Discharge Instructions (Signed)

## 2018-11-21 NOTE — Plan of Care (Signed)
Continue with current POC

## 2018-11-21 NOTE — Plan of Care (Signed)
Continue current POC 

## 2018-11-21 NOTE — Plan of Care (Signed)
Post op plan of care initiated

## 2018-11-21 NOTE — Anesthesia Procedure Notes (Signed)
Spinal  Patient location during procedure: OR Start time: 11/21/2018 12:31 PM End time: 11/21/2018 12:31 PM Staffing Resident/CRNA: Lavina Hamman, CRNA Performed: resident/CRNA  Preanesthetic Checklist Completed: patient identified, site marked, surgical consent, pre-op evaluation, timeout performed, IV checked, risks and benefits discussed and monitors and equipment checked Spinal Block Patient position: sitting Prep: DuraPrep Patient monitoring: heart rate, cardiac monitor, continuous pulse ox and blood pressure Approach: midline Location: L3-4 Injection technique: single-shot Needle Needle type: Sprotte  Needle gauge: 24 G Needle length: 9 cm Needle insertion depth: 8 cm Assessment Sensory level: T4 Additional Notes IV functioning, monitors applied to pt. Expiration date of kit checked and confirmed to be in date. Sterile prep and drape, hand hygiene and sterile gloved used. Pt was positioned and spine was prepped in sterile fashion. Skin was anesthetized with lidocaine. Free flow of clear CSF obtained prior to injecting local anesthetic into CSF x 1 attempt. Spinal needle aspirated freely following injection. Needle was carefully withdrawn, and pt tolerated procedure well. Loss of motor and sensory on exam post injection.

## 2018-11-22 ENCOUNTER — Encounter (HOSPITAL_COMMUNITY): Payer: Self-pay | Admitting: Orthopedic Surgery

## 2018-11-22 DIAGNOSIS — Z79899 Other long term (current) drug therapy: Secondary | ICD-10-CM | POA: Diagnosis not present

## 2018-11-22 DIAGNOSIS — Z9884 Bariatric surgery status: Secondary | ICD-10-CM | POA: Diagnosis not present

## 2018-11-22 DIAGNOSIS — M170 Bilateral primary osteoarthritis of knee: Secondary | ICD-10-CM | POA: Diagnosis not present

## 2018-11-22 DIAGNOSIS — G43909 Migraine, unspecified, not intractable, without status migrainosus: Secondary | ICD-10-CM | POA: Diagnosis not present

## 2018-11-22 DIAGNOSIS — Z96642 Presence of left artificial hip joint: Secondary | ICD-10-CM | POA: Diagnosis not present

## 2018-11-22 DIAGNOSIS — M329 Systemic lupus erythematosus, unspecified: Secondary | ICD-10-CM | POA: Diagnosis not present

## 2018-11-22 DIAGNOSIS — Z8249 Family history of ischemic heart disease and other diseases of the circulatory system: Secondary | ICD-10-CM | POA: Diagnosis not present

## 2018-11-22 DIAGNOSIS — M1611 Unilateral primary osteoarthritis, right hip: Secondary | ICD-10-CM | POA: Diagnosis not present

## 2018-11-22 LAB — BASIC METABOLIC PANEL
Anion gap: 8 (ref 5–15)
BUN: 7 mg/dL (ref 6–20)
CO2: 24 mmol/L (ref 22–32)
Calcium: 9 mg/dL (ref 8.9–10.3)
Chloride: 106 mmol/L (ref 98–111)
Creatinine, Ser: 0.46 mg/dL (ref 0.44–1.00)
GFR calc Af Amer: >60 mL/min (ref >60)
GFR calc non Af Amer: >60 mL/min (ref >60)
Glucose, Bld: 122 mg/dL — ABNORMAL HIGH (ref 70–99)
Potassium: 4.1 mmol/L (ref 3.5–5.1)
Sodium: 138 mmol/L (ref 135–145)

## 2018-11-22 LAB — CBC
HCT: 34.2 % — ABNORMAL LOW (ref 36.0–46.0)
Hemoglobin: 10.7 g/dL — ABNORMAL LOW (ref 12.0–15.0)
MCH: 29.2 pg (ref 26.0–34.0)
MCHC: 31.3 g/dL (ref 30.0–36.0)
MCV: 93.2 fL (ref 80.0–100.0)
Platelets: 224 10*3/uL (ref 150–400)
RBC: 3.67 MIL/uL — ABNORMAL LOW (ref 3.87–5.11)
RDW: 14.3 % (ref 11.5–15.5)
WBC: 12.2 10*3/uL — ABNORMAL HIGH (ref 4.0–10.5)
nRBC: 0 % (ref 0.0–0.2)

## 2018-11-22 MED ORDER — ASPIRIN EC 81 MG PO TBEC: 81.0000 mg | DELAYED_RELEASE_TABLET | ORAL | 0 refills | Status: DC

## 2018-11-22 NOTE — Plan of Care (Signed)
Pt ready for DC home 

## 2018-11-22 NOTE — Progress Notes (Signed)
PATIENT ID: Julia Monroe  MRN: [id number]  DOB/AGE:  [birth year] / 48 y.o.  1 Day Post-Op Procedure(s) (LRB): RIGHT TOTAL HIP ARTHROPLASTY ANTERIOR APPROACH (Right)    PROGRESS NOTE Subjective: Patient is alert, oriented, no Nausea, no Vomiting, yes passing gas, . Taking PO well. Denies SOB, Chest or Calf Pain. Using Incentive Spirometer, PAS in place. Ambulate in room Patient reports pain as  1/10  .    Objective: Vital signs in last 24 hours: Vitals:   11/21/18 1905 11/21/18 2258 11/22/18 0107 11/22/18 0402  BP: 120/63 (Abnormal) 119/58 106/64 117/68  Pulse: 69 69 64 70  Resp: 18 14 14 14   Temp: 98.1 F (36.7 C) 98.1 F (36.7 C) 98.3 F (36.8 C) 98.2 F (36.8 C)  TempSrc: Oral Oral Oral Oral  SpO2: 100% 100% 99% 100%  Weight:      Height:          Intake/Output from previous day: I/O last 3 completed shifts: In: 5031.2 [P.O.:480; I.V.:4101.2; IV Piggyback:450] Out: [id number] [Urine:3750; Blood:550]   Intake/Output this shift: No intake/output data recorded.   LABORATORY DATA: Recent Labs    11/22/18 0248  WBC 12.2*  HGB 10.7*  HCT 34.2*  PLT 224  NA 138  K 4.1  CL 106  CO2 24  BUN 7  CREATININE 0.46  GLUCOSE 122*  CALCIUM 9.0    Examination: Neurologically intact ABD soft Neurovascular intact Sensation intact distally Intact pulses distally Dorsiflexion/Plantar flexion intact Incision: dressing C/D/I No cellulitis present Compartment soft} XR AP&Lat of hip shows well placed\fixed THA  Assessment:   1 Day Post-Op Procedure(s) (LRB): RIGHT TOTAL HIP ARTHROPLASTY ANTERIOR APPROACH (Right) ADDITIONAL DIAGNOSIS:  Expected Acute Blood Loss Anemia, Lupus, morbid obeisty  Patient's anticipated LOS is less than 2 midnights, meeting these requirements: - Younger than 46 - Lives within 1 hour of care - Has a competent adult at home to recover with post-op recover - NO history of  - Chronic pain requiring opiods  - Diabetes  - Coronary Artery  Disease  - Heart failure  - Heart attack  - Stroke  - DVT/VTE  - Cardiac arrhythmia  - Respiratory Failure/COPD  - Renal failure  - Anemia  - Advanced Liver disease       Plan: PT/OT WBAT, THA  DVT Prophylaxis: SCDx72 hrs, ASA 81 mg BID x 2 weeks  DISCHARGE PLAN: Home  DISCHARGE NEEDS: HHPT, Walker and 3-in-1 comode seat

## 2018-11-22 NOTE — Discharge Summary (Signed)
Patient ID: Julia Monroe MRN: [id number] DOB/AGE: 46-Nov-1972 46 y.o.  Admit date: 11/21/2018 Discharge date: 11/22/2018  Admission Diagnoses:  Active Problems:   History of total hip arthroplasty, right   Discharge Diagnoses:  Same  Past Medical History:  Diagnosis Date  . Arthritis    osteoarthritis  . Complication of anesthesia    bottom of feet itching  . Elevated hemoglobin A1c 2018   6.1  . Lupus ([place]) 2018   Dr.Deveshwar, currently in normal range  . Migraines     Surgeries: Procedure(s): RIGHT TOTAL HIP ARTHROPLASTY ANTERIOR APPROACH on 11/21/2018   Consultants:   Discharged Condition: Improved  Hospital Course: Julia Monroe is an 48 y.o. female who was admitted 11/21/2018 for operative treatment of<principal problem not specified>. Patient has severe unremitting pain that affects sleep, daily activities, and work/hobbies. After pre-op clearance the patient was taken to the operating room on 11/21/2018 and underwent  Procedure(s): RIGHT TOTAL HIP ARTHROPLASTY ANTERIOR APPROACH.    Patient was given perioperative antibiotics:  Anti-infectives (From admission, onward)   Start     Dose/Rate Route Frequency Ordered Stop   11/21/18 2200  hydroxychloroquine (PLAQUENIL) tablet 200 mg     200 mg Oral 2 times daily 11/21/18 1449     11/21/18 0945  ceFAZolin (ANCEF) IVPB 2g/100 mL premix     2 g 200 mL/hr over 30 Minutes Intravenous On call to O.R. 11/21/18 0932 11/21/18 1216       Patient was given sequential compression devices, early ambulation, and chemoprophylaxis to prevent DVT.  Patient benefited maximally from hospital stay and there were no complications.    Recent vital signs:  Patient Vitals for the past 24 hrs:  BP Temp Temp src Pulse Resp SpO2 Height Weight  11/22/18 0402 117/68 98.2 F (36.8 C) Oral 70 14 100 % - -  11/22/18 0107 106/64 98.3 F (36.8 C) Oral 64 14 99 % - -  11/21/18 2258 (!) 119/58 98.1 F (36.7 C) Oral 69 14  100 % - -  11/21/18 1905 120/63 98.1 F (36.7 C) Oral 69 18 100 % - -  11/21/18 1808 117/65 98.2 F (36.8 C) Oral 68 18 100 % - -  11/21/18 1700 116/75 98.1 F (36.7 C) Oral 69 18 100 % - -  11/21/18 1605 98/78 98 F (36.7 C) - 64 14 98 % - -  11/21/18 1545 118/70 97.9 F (36.6 C) - (!) 56 13 100 % - -  11/21/18 1530 115/76 - - (!) 53 15 100 % - -  11/21/18 1515 104/73 97.9 F (36.6 C) - 66 17 100 % - -  11/21/18 1500 111/72 - - 65 12 100 % - -  11/21/18 1445 106/60 - - 78 (!) 23 98 % - -  11/21/18 1437 116/87 97.9 F (36.6 C) - 78 15 100 % - -  11/21/18 0953 128/77 98 F (36.7 C) Oral (!) 57 16 100 % - -  11/21/18 0933 - - - - - - 5\' 9"  (1.753 m) 108.9 kg     Recent laboratory studies:  Recent Labs    11/22/18 0248  WBC 12.2*  HGB 10.7*  HCT 34.2*  PLT 224  NA 138  K 4.1  CL 106  CO2 24  BUN 7  CREATININE 0.46  GLUCOSE 122*  CALCIUM 9.0     Discharge Medications:   Allergies as of 11/22/2018      Reactions   Monosodium Glutamate Other (See Comments)  Loses vision, headache/migraine   Lactose Intolerance (gi) Other (See Comments)   Gi upset   Caffeine Other (See Comments)   Severe headache    Hydrocodone Other (See Comments)   Headaches/migraines    Oxycodone Other (See Comments)   Headache/Migraine      Medication List    TAKE these medications   Allegra Allergy 180 MG tablet Generic drug: fexofenadine Take 180 mg by mouth daily as needed (allergies).   aspirin EC 81 MG tablet Take 1 tablet (81 mg total) by mouth every other day.   BARIATRIC MULTIVITAMINS/IRON PO Take 1 tablet by mouth 2 (two) times daily.   BIOFREEZE EX Apply 1 application topically 2 (two) times daily as needed (pain).   calcium carbonate 1250 (500 Ca) MG tablet Commonly known as: OS-CAL - dosed in mg of elemental calcium Take 500 mg by mouth 3 (three) times daily.   Cambia 50 MG Pack Generic drug: Diclofenac Potassium Take 50 mg by mouth daily as needed (migraine).    diclofenac sodium 1 % Gel Commonly known as: VOLTAREN Apply 3 gm to 3 large joints up to 3 times a day.Dispense 3 tubes with 3 refills. What changed:   how much to take  how to take this  when to take this  reasons to take this  additional instructions   HYDROmorphone 2 MG tablet Commonly known as: Dilaudid Take 1 tablet (2 mg total) by mouth every 4 (four) hours as needed for severe pain.   hydroxychloroquine 200 MG tablet Commonly known as: PLAQUENIL Take 1 tablet (200 mg total) by mouth 2 (two) times daily.   tiZANidine 2 MG tablet Commonly known as: ZANAFLEX Take 1 tablet (2 mg total) by mouth every 6 (six) hours as needed.   traMADol 50 MG tablet Commonly known as: ULTRAM Take 1 tablet (50 mg total) by mouth every 6 (six) hours as needed for severe pain.   TYLENOL 500 MG tablet Generic drug: acetaminophen Take 1,000 mg by mouth every 6 (six) hours as needed for moderate pain.   VISINE OP Place 1 drop into both eyes 3 (three) times daily as needed (for irritated/dry eyes.).   Vitamin D 125 MCG (5000 UT) Caps Take 5,000 Units by mouth daily.            Durable Medical Equipment  (From admission, onward)         Start     Ordered   11/21/18 1450  DME Walker rolling  Once    Question:  Patient needs a walker to treat with the following condition  Answer:  Status post right hip replacement   11/21/18 1449   11/21/18 1450  DME 3 n 1  Once     11/21/18 1449           Discharge Care Instructions  (From admission, onward)         Start     Ordered   11/22/18 0000  Weight bearing as tolerated     11/22/18 [id number]          Diagnostic Studies: Dg C-arm 1-60 Min-no Report  Result Date: 11/21/2018 Fluoroscopy was utilized by the requesting physician.  No radiographic interpretation.   Dg Hip Operative Unilat With Pelvis Right  Result Date: 11/21/2018 CLINICAL DATA:  RIGHT hip replacement EXAM: OPERATIVE RIGHT HIP (WITH PELVIS IF PERFORMED) 3  VIEWS TECHNIQUE: Fluoroscopic spot image(s) were submitted for interpretation post-operatively. FLUOROSCOPY TIME:  0 minutes 19 seconds COMPARISON:  07/06/2011 FINDINGS: RIGHT hip  prosthesis in expected position. Bones demineralized. No fracture, dislocation, or bone destruction. Indwelling LEFT hip prosthesis noted. IMPRESSION: New RIGHT hip prosthesis without acute complication. Electronically Signed   By: Lavonia Dana M.D.   On: 11/21/2018 14:29    Disposition: Discharge disposition: 01-Home or Self Care       Discharge Instructions    Call MD / Call 911   Complete by: As directed    If you experience chest pain or shortness of breath, CALL 911 and be transported to the hospital emergency room.  If you develope a fever above 101 F, pus (white drainage) or increased drainage or redness at the wound, or calf pain, call your surgeon's office.   Constipation Prevention   Complete by: As directed    Drink plenty of fluids.  Prune juice may be helpful.  You may use a stool softener, such as Colace (over the counter) 100 mg twice a day.  Use MiraLax (over the counter) for constipation as needed.   Diet - low sodium heart healthy   Complete by: As directed    Driving restrictions   Complete by: As directed    No driving for 2 weeks   Increase activity slowly as tolerated   Complete by: As directed    Patient may shower   Complete by: As directed    You may shower without a dressing once there is no drainage.  Do not wash over the wound.  If drainage remains, cover wound with plastic wrap and then shower.   Weight bearing as tolerated   Complete by: As directed       Follow-up Information    Frederik Pear, MD In 2 weeks.   Specialty: Orthopedic Surgery Contact information: [place] [address] [phone]            Signed: Joanell Rising 11/22/2018, 8:09 AM

## 2018-11-22 NOTE — Progress Notes (Signed)
Pt progressed well this afternoon with PT. Pt feels confident she can do the exercises without help of HHPT. Pt very motivated and is a self starter in general. HEP should be adequate for pt.

## 2018-11-22 NOTE — Progress Notes (Signed)
Physical Therapy Treatment Patient Details Name: Julia Monroe MRN: [id number] DOB: Aug 27, 1970 Today's Date: 11/22/2018    History of Present Illness Patient is 48 y.o. s/p Rt THA anterior approach with PMH significant for lupus, migraines, OA, and Lt THA in 2013.    PT Comments    Pt assisted with ambulating however became lightheaded.  Pt assisted to chair.  BP low and RN aware.  Pt assisted back to bed and left with RN.      Follow Up Recommendations  Follow surgeon's recommendation for DC plan and follow-up therapies     Equipment Recommendations  Rolling walker with 5" wheels    Recommendations for Other Services       Precautions / Restrictions Precautions Precautions: Fall Restrictions Weight Bearing Restrictions: No    Mobility  Bed Mobility Overal bed mobility: Needs Assistance Bed Mobility: Supine to Sit;Sit to Supine     Supine to sit: Supervision;HOB elevated Sit to supine: Mod assist   General bed mobility comments: pt self assisted R LE over EOB, more assist required for back to bed due to pt not feeling well (low BP)  Transfers Overall transfer level: Needs assistance Equipment used: Rolling walker (2 wheeled) Transfers: Sit to/from Stand Sit to Stand: Min assist         General transfer comment: verbal cues for UE and LE positioning, assist to rise and control descent  Ambulation/Gait Ambulation/Gait assistance: Min guard Gait Distance (Feet): 50 Feet Assistive device: Rolling walker (2 wheeled) Gait Pattern/deviations: Step-through pattern;Decreased stride length;Trunk flexed     General Gait Details: verbal cues for RW positioning, step length, posture; pt reported "lightheadedness" so assisted to sitting in chair; this did not resolve so checked BP: 58/43 mmHg (RN with pt and PT and helped assist pt from chair back to bed)   Stairs             Wheelchair Mobility    Modified Rankin (Stroke Patients Only)        Balance                                            Cognition Arousal/Alertness: Awake/alert Behavior During Therapy: WFL for tasks assessed/performed Overall Cognitive Status: Within Functional Limits for tasks assessed                                        Exercises      General Comments        Pertinent Vitals/Pain Pain Assessment: 0-10 Pain Score: 8  Pain Location: right hip Pain Descriptors / Indicators: Burning Pain Intervention(s): Monitored during session;Repositioned    Home Living                      Prior Function            PT Goals (current goals can now be found in the care plan section) Progress towards PT goals: Progressing toward goals    Frequency    7X/week      PT Plan Current plan remains appropriate    Co-evaluation              AM-PAC PT "6 Clicks" Mobility   Outcome Measure  Help needed turning from your back to your side while in  a flat bed without using bedrails?: A Little Help needed moving from lying on your back to sitting on the side of a flat bed without using bedrails?: A Little Help needed moving to and from a bed to a chair (including a wheelchair)?: A Little Help needed standing up from a chair using your arms (e.g., wheelchair or bedside chair)?: A Little Help needed to walk in hospital room?: A Little Help needed climbing 3-5 steps with a railing? : A Lot 6 Click Score: 17    End of Session Equipment Utilized During Treatment: Gait belt Activity Tolerance: Patient tolerated treatment well Patient left: with call bell/phone within reach;in bed;with nursing/sitter in room   PT Visit Diagnosis: Muscle weakness (generalized) (M62.81);Difficulty in walking, not elsewhere classified (R26.2)     Time: [id number] PT Time Calculation (min) (ACUTE ONLY): 21 min  Charges:  $Gait Training: 8-22 mins                    Carmelia Bake, PT, DPT Acute Rehabilitation  Services Office: [phone] Pager: [phone]  Trena Platt 11/22/2018, 12:38 PM

## 2018-11-22 NOTE — TOC Transition Note (Signed)
Transition of Care [facility]) - CM/SW Discharge Note   Patient Details  Name: Julia Monroe MRN: [id number] Date of Birth: 1970/04/03  Transition of Care [facility]) CM/SW Contact:  Lia Hopping, [place] Phone Number: 11/22/2018, 3:43 PM   Clinical Narrative:       Final next level of care: Home/Self Care Barriers to Discharge: No Barriers Identified   Patient Goals and CMS Choice     Choice offered to / list presented to : NA  Discharge Placement                       Discharge Plan and Services                DME Arranged: Walker rolling DME Agency: [employer] Date DME Agency Contacted: 11/22/18 Time DME Agency Contacted: 0900 Representative spoke with at DME Agency: [place] ([place]) Interventions     Readmission Risk Interventions No flowsheet data found.

## 2018-11-22 NOTE — Progress Notes (Signed)
Physical Therapy Treatment Patient Details Name: Julia Monroe MRN: [id number] DOB: 24-Oct-1970 Today's Date: 11/22/2018    History of Present Illness Patient is 48 y.o. s/p Rt THA anterior approach with PMH significant for lupus, migraines, OA, and Lt THA in 2013.    PT Comments    Pt reports feeling much better this afternoon.  Pt ambulated in hallway and practiced one step.  Pt also performed LE exercises and provided with HEP handout.  Pt had no further questions.  Pt plans to continue HEP until f/u with OP PT next week.   Follow Up Recommendations  Follow surgeon's recommendation for DC plan and follow-up therapies     Equipment Recommendations  Rolling walker with 5" wheels    Recommendations for Other Services       Precautions / Restrictions Precautions Precautions: Fall Restrictions Weight Bearing Restrictions: No    Mobility  Bed Mobility Overal bed mobility: Needs Assistance Bed Mobility: Supine to Sit;Sit to Supine     Supine to sit: Supervision;HOB elevated Sit to supine: Mod assist   General bed mobility comments: pt in recliner  Transfers Overall transfer level: Needs assistance Equipment used: Rolling walker (2 wheeled) Transfers: Sit to/from Stand Sit to Stand: Min guard;Supervision         General transfer comment: verbal cues for UE and LE positioning  Ambulation/Gait Ambulation/Gait assistance: Min guard;Supervision Gait Distance (Feet): 120 Feet Assistive device: Rolling walker (2 wheeled) Gait Pattern/deviations: Step-through pattern;Decreased stride length;Trunk flexed     General Gait Details: verbal cues for RW positioning, step length, posture; no dizziness or lightheadedness this afternoon   Stairs Stairs: Yes Stairs assistance: Min guard Stair Management: Step to pattern;Forwards;With walker Number of Stairs: 1 General stair comments: verbal cues for sequence, RW positioning, safety; pt reports  understanding   Wheelchair Mobility    Modified Rankin (Stroke Patients Only)       Balance                                            Cognition Arousal/Alertness: Awake/alert Behavior During Therapy: WFL for tasks assessed/performed Overall Cognitive Status: Within Functional Limits for tasks assessed                                        Exercises Total Joint Exercises Ankle Circles/Pumps: AROM;Both;10 reps Quad Sets: AROM;Right;10 reps Heel Slides: 10 reps;Right;AAROM Hip ABduction/ADduction: AROM;Right;10 reps;Standing;Supine Long Arc Quad: AROM;Right;Seated;10 reps Knee Flexion: AROM;Right;10 reps;Standing Marching in Standing: 10 reps;Standing;Right;AROM Standing Hip Extension: AROM;Right;10 reps;Standing    General Comments        Pertinent Vitals/Pain Pain Assessment: 0-10 Pain Score: 5  Pain Location: right hip/thigh Pain Descriptors / Indicators: Burning Pain Intervention(s): Monitored during session;Premedicated before session;Repositioned    Home Living                      Prior Function            PT Goals (current goals can now be found in the care plan section) Progress towards PT goals: Progressing toward goals    Frequency    7X/week      PT Plan Current plan remains appropriate    Co-evaluation  AM-PAC PT "6 Clicks" Mobility   Outcome Measure  Help needed turning from your back to your side while in a flat bed without using bedrails?: A Little Help needed moving from lying on your back to sitting on the side of a flat bed without using bedrails?: A Little Help needed moving to and from a bed to a chair (including a wheelchair)?: A Little Help needed standing up from a chair using your arms (e.g., wheelchair or bedside chair)?: A Little Help needed to walk in hospital room?: A Little Help needed climbing 3-5 steps with a railing? : A Little 6 Click Score: 18     End of Session Equipment Utilized During Treatment: Gait belt Activity Tolerance: Patient tolerated treatment well Patient left: with call bell/phone within reach;in chair;with family/visitor present Nurse Communication: Mobility status PT Visit Diagnosis: Muscle weakness (generalized) (M62.81);Difficulty in walking, not elsewhere classified (R26.2)     Time: [id number] PT Time Calculation (min) (ACUTE ONLY): 19 min  Charges:  $Therapeutic Exercise: 8-22 mins                     Carmelia Bake, PT, DPT Acute Rehabilitation Services Office: [phone] Pager: [phone]  Trena Platt 11/22/2018, 2:58 PM

## 2018-11-24 NOTE — Addendum Note (Signed)
Addendum  created 11/24/18 0525 by Lyn Hollingshead, MD   Clinical Note Signed

## 2018-11-24 NOTE — Anesthesia Postprocedure Evaluation (Addendum)
Anesthesia Post Note  Patient: Julia Monroe  Procedure(s) Performed: RIGHT TOTAL HIP ARTHROPLASTY ANTERIOR APPROACH (Right Hip)     Patient location during evaluation: PACU Anesthesia Type: Spinal Level of consciousness: awake and sedated Pain management: pain level controlled Vital Signs Assessment: post-procedure vital signs reviewed and stable Respiratory status: spontaneous breathing Cardiovascular status: stable Postop Assessment: no apparent nausea or vomiting, no headache, no backache and spinal receding Anesthetic complications: no    Last Vitals:  Vitals:   11/22/18 0920 11/22/18 1011  BP: (!) 117/54 (!) 112/55  Pulse: 91 64  Resp: 16   Temp: 37.2 C   SpO2: 100%     Last Pain:  Vitals:   11/22/18 0920  TempSrc: Oral  PainSc:    Pain Goal: Patients Stated Pain Goal: 4 (11/21/18 1930)                 Huston Foley

## 2018-12-01 ENCOUNTER — Other Ambulatory Visit: Payer: Self-pay | Admitting: Obstetrics and Gynecology

## 2018-12-01 DIAGNOSIS — Z1231 Encounter for screening mammogram for malignant neoplasm of breast: Secondary | ICD-10-CM

## 2018-12-06 DIAGNOSIS — M1611 Unilateral primary osteoarthritis, right hip: Secondary | ICD-10-CM | POA: Diagnosis not present

## 2018-12-08 ENCOUNTER — Encounter: Payer: Self-pay | Admitting: Dietician

## 2018-12-08 ENCOUNTER — Other Ambulatory Visit: Payer: Self-pay

## 2018-12-08 ENCOUNTER — Ambulatory Visit: Payer: 59 | Admitting: Skilled Nursing Facility1

## 2018-12-08 ENCOUNTER — Encounter: Payer: 59 | Attending: General Surgery | Admitting: Dietician

## 2018-12-08 DIAGNOSIS — Z6841 Body Mass Index (BMI) 40.0 and over, adult: Secondary | ICD-10-CM | POA: Insufficient documentation

## 2018-12-08 DIAGNOSIS — Z9884 Bariatric surgery status: Secondary | ICD-10-CM

## 2018-12-08 NOTE — Progress Notes (Signed)
Bariatric Nutrition Follow-Up Visit Medical Nutrition Therapy  Appt Start Time: 11:30am   End Time: 11:55am  9 Months Post-Operative Sleeve Surgery Surgery Date: 03/21/2018  Pt's Expectations of Surgery/ Goals: to be able to workout again   NUTRITION ASSESSMENT  Anthropometrics  Start weight at [facility]: 358.3 lbs (date: 12/29/2017) Today's weight: pt declined  Weight change: pt states she is down 140 lbs  Clinical  Medical hx: osteoarthritis, leukocytosis, prediabetes, migraine, systemic lupus erythematosus (NEW: hip surgery 11/21/2018)    Lifestyle & Dietary Hx Patient is very Scientist, forensic and motivated. States she feels great and was able to have hip surgery a couple weeks ago. She has made great progress thus far. States she never had an issue with sweets or junk foods before, but just overate in general. States she has learned new habits which are now easy to follow (such as taking vitamins on a schedule, not drinking with meals, knowing how much she can fit in at a time, etc.)  Avoids caffeine, MSG, carbonation, added sugars. Meals are high in protein and pt states she eats lots of vegetables. Typically does not have protein drinks, prefers to get protein from foods. Sometimes will have yogurt with a little fruit and granola. States that prior to surgery she had 2-3 BM/day but went through periods of constipation after surgery, now drinking "ballerina" tea helps.   Estimated daily fluid intake: 64+ oz Estimated daily protein intake: 60+ g Supplements: bariatric MVI, calcium 3x/day Current average weekly physical activity: walking 5 miles/day    24-Hr Dietary Recall First Meal: 2 eggs + 2 thin bacon slices  Snack: veggie   Second Meal: small side salad + chicken  Snack: unsalted nuts   Third Meal: vegetable + baked fish  Snack: - Beverages: water, lemonade (made with [place] or Equal), water w/ Crystal Light, "ballerina" tea  Post-Op Goals/ Signs/ Symptoms Using straws:  no Drinking while eating: no Chewing/swallowing difficulties: no Changes in vision: no Changes to mood/headaches: no Hair loss/changes to skin/nails: no Difficulty focusing/concentrating: no Sweating: no Dizziness/lightheadedness: no Palpitations: no  Carbonated/caffeinated beverages: no N/V/D/C/Gas: constipation Abdominal pain: no Dumping syndrome: no   NUTRITION DIAGNOSIS  Overweight/obesity ([place]-3.3) related to past poor dietary habits and physical inactivity as evidenced by completed bariatric surgery and following dietary guidelines for continued weight loss and healthy nutrition status.   NUTRITION INTERVENTION Nutrition counseling (C-1) and education (E-2) to facilitate bariatric surgery goals, including: . Diet advancement to the next phase (phase 6) now including fruit . The importance of consuming adequate calories as well as certain nutrients daily due to the body's need for essential vitamins, minerals, and fats . The importance of daily physical activity and to reach a goal of at least 150 minutes of moderate to vigorous physical activity weekly (or as directed by their physician) due to benefits such as increased musculature and improved lab values  Pt Chosen Goals from Previous Visit  I will have a food choice from these 3 food groups (starch, protein, and vegetable) 2 times a day by 11/05/2018.   I will eat 3 meals a day 5 days a week by 11/05/2018.   I will eat non-starchy vegetables at least 2 separate times in a day 7 days a week by 11/05/2018.   All goals accomplished!  Handouts Provided Include   Phase 6: Protein + All Vegetables + Fruit  Learning Style & Readiness for Change Teaching method utilized: Visual & Auditory  Demonstrated degree of understanding via: Teach Back  Barriers  to learning/adherence to lifestyle change: None Identified    MONITORING & EVALUATION Dietary intake, weekly physical activity, body weight, and goals in 3 months.  Next  Steps Patient is to follow-up in 3 months for 12 month post-op follow-up.

## 2018-12-16 ENCOUNTER — Other Ambulatory Visit: Payer: Self-pay

## 2018-12-19 NOTE — Progress Notes (Signed)
48 y.o. G1P1001 Single African American female here for annual exam.    Lost 134 pounds after gastric sleeve.  Followed by Dr. Redmond Pulling at [facility].   Also had right hip replacement 4 weeks ago.   Used some Dial soap and now she has vulvar irritation.  She denies hx of HSV I and II. Partner does not have infection.   Labs with Rheumatology.   Brother died from complications following amputation surgery.  He had diabetes.  Aunt diet from Covid.   PCP:   Grier Mitts, MD  Patient's last menstrual period was 04/29/2016.           Sexually active: Yes.    The current method of family planning is status post hysterectomy.    Exercising: Yes.    walking Smoker:  no  Health Maintenance: Pap:  08-24-15 Neg:Neg HR HPV.  Final cervical pathology specimen at hysterectomy was normal. History of abnormal Pap:  no MMG: 12-17-17 Neg/density A/Birads1--appt. 01-05-19 Colonoscopy:  n/a BMD:   n/a  Result  n/a TDaP:  up to date through work--due 01/2019 and patient would like today Gardasil:   no [id number] ago--Neg Hep C:Years ago--Neg Screening Labs:  Rheumatology.  Flu vaccine:  Completed.   reports that she has never smoked. She has never used smokeless tobacco. She reports that she does not drink alcohol or use drugs.  Past Medical History:  Diagnosis Date  . Arthritis    osteoarthritis  . Complication of anesthesia    bottom of feet itching  . Elevated hemoglobin A1c 2018   6.1  . Lupus ([place]) 2018   Dr.Deveshwar, currently in normal range  . Migraines     Past Surgical History:  Procedure Laterality Date  . ABDOMINAL HYSTERECTOMY    . CESAREAN SECTION  2000  . CYSTOSCOPY N/A 07/06/2016   Procedure: CYSTOSCOPY;  Surgeon: Nunzio Cobbs, MD;  Location: [facility];  Service: Gynecology;  Laterality: N/A;  . JOINT REPLACEMENT  L   Left hip replacement  . LAPAROSCOPIC GASTRIC SLEEVE RESECTION N/A 03/21/2018   Procedure: LAPAROSCOPIC GASTRIC SLEEVE  RESECTION, UPPER ENDO, ERAS Pathway;  Surgeon: [name], MD;  Location: [facility];  Service: General;  Laterality: N/A;  . OOPHORECTOMY  2007   Rt.oophorectomy   . OVARIAN CYST SURGERY  ? and 10   rt removed,cyst off lft  . TOTAL HIP ARTHROPLASTY  07/06/2011   Procedure: TOTAL HIP ARTHROPLASTY;  Surgeon: Kerin Salen, MD;  Location: [facility];  Service: Orthopedics;  Laterality: Left;  DEPUY/PINNACLE  . TOTAL HIP ARTHROPLASTY Right 11/21/2018   Procedure: RIGHT TOTAL HIP ARTHROPLASTY ANTERIOR APPROACH;  Surgeon: Frederik Pear, MD;  Location: [facility];  Service: Orthopedics;  Laterality: Right;  . TOTAL LAPAROSCOPIC HYSTERECTOMY WITH SALPINGECTOMY Left 07/06/2016   Procedure: HYSTERECTOMY TOTAL LAPAROSCOPIC WITH Left SALPINGECTOMY, Suture Left Side Wall Laceration;  Surgeon: Nunzio Cobbs, MD;  Location: [facility];  Service: Gynecology;  Laterality: Left;    Current Outpatient Medications  Medication Sig Dispense Refill  . acetaminophen (TYLENOL) 500 MG tablet Take 1,000 mg by mouth every 6 (six) hours as needed for moderate pain.     . calcium carbonate (OS-CAL - DOSED IN MG OF ELEMENTAL CALCIUM) 1250 (500 Ca) MG tablet Take 500 mg by mouth 3 (three) times daily.    Marland Kitchen CAMBIA 50 MG PACK Take 50 mg by mouth daily as needed (migraine).   0  . Cholecalciferol (VITAMIN D) 125 MCG (5000 UT)  CAPS Take 5,000 Units by mouth daily.    . diclofenac sodium (VOLTAREN) 1 % GEL Apply 3 gm to 3 large joints up to 3 times a day.Dispense 3 tubes with 3 refills. (Patient taking differently: Apply 2 g topically daily as needed (pain). ) 3 Tube 0  . fexofenadine (ALLEGRA ALLERGY) 180 MG tablet Take 180 mg by mouth daily as needed (allergies).     . hydroxychloroquine (PLAQUENIL) 200 MG tablet Take 1 tablet (200 mg total) by mouth 2 (two) times daily. 180 tablet 0  . Menthol, Topical Analgesic, (BIOFREEZE EX) Apply 1 application topically 2 (two) times daily as needed (pain).    . Multiple Vitamins-Minerals  (BARIATRIC MULTIVITAMINS/IRON PO) Take 1 tablet by mouth 2 (two) times daily.    . Tetrahydrozoline HCl (VISINE OP) Place 1 drop into both eyes 3 (three) times daily as needed (for irritated/dry eyes.).    Marland Kitchen tiZANidine (ZANAFLEX) 2 MG tablet Take 1 tablet (2 mg total) by mouth every 6 (six) hours as needed. 60 tablet 0  . traMADol (ULTRAM) 50 MG tablet Take 1 tablet (50 mg total) by mouth every 6 (six) hours as needed for severe pain. 15 tablet 0   No current facility-administered medications for this visit.     Family History  Problem Relation Age of Onset  . Osteoarthritis Mother   . Osteoarthritis Brother   . Hypertension Brother   . Diabetes Brother   . Osteoarthritis Maternal Grandmother   . Arthritis Maternal Grandmother     Review of Systems  All other systems reviewed and are negative.   Exam:   BP 120/62 (Cuff Size: Large)   Pulse 80   Temp (!) 96.6 F (35.9 C) (Temporal)   Resp 18   Ht 5\' 10"  (1.778 m)   Wt 230 lb 9.6 oz (104.6 kg)   LMP 04/29/2016 Comment: urine preg.negative 03/15/18  BMI 33.09 kg/m     General appearance: alert, cooperative and appears stated age Head: normocephalic, without obvious abnormality, atraumatic Neck: no adenopathy, supple, symmetrical, trachea midline and thyroid normal to inspection and palpation Lungs: clear to auscultation bilaterally Breasts: normal appearance, no masses or tenderness, No nipple retraction or dimpling, No nipple discharge or bleeding, No axillary adenopathy Heart: regular rate and rhythm Abdomen: soft, non-tender; no masses, no organomegaly Extremities: extremities normal, atraumatic, no cyanosis or edema Skin: skin color, texture, turgor normal. No rashes or lesions Lymph nodes: cervical, supraclavicular, and axillary nodes normal. Neurologic: grossly normal  Pelvic: External genitalia: two 2 mm ulcers of the clitoral hood.              No abnormal inguinal nodes palpated.              Urethra:  normal  appearing urethra with no masses, tenderness or lesions              Bartholins and Skenes: normal                 Vagina: normal appearing vagina with normal color and discharge, no lesions              Cervix;  absent              Pap taken: No. Bimanual Exam:  Uterus:  absent              Adnexa: no mass, fullness, tenderness              Rectal exam: Yes.  .  Confirms.  Anus:  normal sphincter tone, no lesions  Chaperone was present for exam.  Assessment:   Well woman visit with normal exam. Status post gastric sleeve surgery.  Status post RSO.  Status post total laparoscopic hysterectomy with left salpingectomy/cystoscopy.  Still has left ovary.  Arthritic pain. On Plaquenil.  Lupus. AODM. Resolved with weight loss.   Plan: Mammogram screening discussed. Self breast awareness reviewed. Pap and HR HPV as above. Guidelines for Calcium, Vitamin D, regular exercise program including cardiovascular and weight bearing exercise. IFOB.  TDap today.  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  I discussed potential HSV but will wait for final result. No antiviral tx.  Lidocaine ointment tid prn.  Follow up annually and prn.   After visit summary provided.

## 2018-12-20 ENCOUNTER — Ambulatory Visit (INDEPENDENT_AMBULATORY_CARE_PROVIDER_SITE_OTHER): Payer: 59 | Admitting: Obstetrics and Gynecology

## 2018-12-20 ENCOUNTER — Encounter: Payer: Self-pay | Admitting: Obstetrics and Gynecology

## 2018-12-20 ENCOUNTER — Other Ambulatory Visit: Payer: Self-pay

## 2018-12-20 VITALS — BP 120/62 | HR 80 | Temp 96.6°F | Resp 18 | Ht 70.0 in | Wt 230.6 lb

## 2018-12-20 DIAGNOSIS — N766 Ulceration of vulva: Secondary | ICD-10-CM | POA: Diagnosis not present

## 2018-12-20 DIAGNOSIS — Z01419 Encounter for gynecological examination (general) (routine) without abnormal findings: Secondary | ICD-10-CM | POA: Diagnosis not present

## 2018-12-20 DIAGNOSIS — Z23 Encounter for immunization: Secondary | ICD-10-CM | POA: Diagnosis not present

## 2018-12-20 DIAGNOSIS — Z1211 Encounter for screening for malignant neoplasm of colon: Secondary | ICD-10-CM | POA: Diagnosis not present

## 2018-12-20 MED ORDER — LIDOCAINE 5 % EX OINT: 1.0000 "application " | TOPICAL_OINTMENT | Freq: Three times a day (TID) | CUTANEOUS | 0 refills | Status: DC

## 2018-12-20 NOTE — Patient Instructions (Signed)

## 2018-12-23 LAB — HSV NAA
HSV 1 NAA: NEGATIVE
HSV 2 NAA: POSITIVE — AB

## 2018-12-26 ENCOUNTER — Encounter: Payer: Self-pay | Admitting: Obstetrics and Gynecology

## 2018-12-27 DIAGNOSIS — Z1211 Encounter for screening for malignant neoplasm of colon: Secondary | ICD-10-CM | POA: Diagnosis not present

## 2018-12-28 ENCOUNTER — Ambulatory Visit (INDEPENDENT_AMBULATORY_CARE_PROVIDER_SITE_OTHER): Payer: 59 | Admitting: Obstetrics and Gynecology

## 2018-12-28 ENCOUNTER — Other Ambulatory Visit: Payer: Self-pay

## 2018-12-28 ENCOUNTER — Encounter: Payer: Self-pay | Admitting: Obstetrics and Gynecology

## 2018-12-28 ENCOUNTER — Other Ambulatory Visit (HOSPITAL_COMMUNITY)
Admission: RE | Admit: 2018-12-28 | Discharge: 2018-12-28 | Disposition: A | Discharge status: Home / Self Care | Payer: 59 | Source: Ambulatory Visit | Attending: Obstetrics and Gynecology | Admitting: Obstetrics and Gynecology

## 2018-12-28 VITALS — BP 110/64 | HR 60 | Temp 96.3°F | Ht 70.0 in | Wt 230.0 lb

## 2018-12-28 DIAGNOSIS — Z113 Encounter for screening for infections with a predominantly sexual mode of transmission: Secondary | ICD-10-CM | POA: Diagnosis not present

## 2018-12-28 DIAGNOSIS — B009 Herpesviral infection, unspecified: Secondary | ICD-10-CM

## 2018-12-28 MED ORDER — VALACYCLOVIR HCL 500 MG PO TABS: 500.0000 mg | ORAL_TABLET | Freq: Two times a day (BID) | ORAL | 5 refills | Status: DC

## 2018-12-28 NOTE — Progress Notes (Signed)
GYNECOLOGY  VISIT   HPI: 48 y.o.   Single  African American  female   [id number] with Patient's last menstrual period was 04/29/2016. here for consult regarding lab results.   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   Maybe has a history of irritative vulvar symptoms.  No known prior dx of HSV.   Her partner will get testing.   GYNECOLOGIC HISTORY: Patient's last menstrual period was 04/29/2016. Contraception: Hysterectomy Menopausal hormone therapy: none Last mammogram: 12-17-17 Neg/density A/Birads1--appt. 01-05-19 Last pap smear:08-24-15 Neg:Neg HR HPV. Final cervical pathology specimen at hysterectomy was normal.         OB History    Gravida  1   Para  1   Term  1   Preterm      AB      Living  1     SAB      TAB      Ectopic      Multiple      Live Births                 Patient Active Problem List   Diagnosis Date Noted  . History of total hip arthroplasty, right 11/21/2018  . Prediabetes 03/21/2018  . SLE (systemic lupus erythematosus) ([place]) 03/21/2018  . Obesity 03/21/2018  . History of migraine 08/27/2017  . Class 3 severe obesity due to excess calories without serious comorbidity with body mass index (BMI) of 50.0 to 59.9 in adult ([place]) 01/27/2017  . History of total hip replacement, left 10/27/2016  . Hyperglycemia 08/25/2016  . Leukocytosis 08/13/2016  . Status post laparoscopic hysterectomy 07/06/2016  . Primary osteoarthritis of both knees 04/29/2016  . ANA positive 04/29/2016  . Primary osteoarthritis of right hip 07/10/2011    Past Medical History:  Diagnosis Date  . Arthritis    osteoarthritis  . Complication of anesthesia    bottom of feet itching  . Elevated hemoglobin A1c 2018   6.1  . Herpes simplex type II infection 2020  . Lupus ([place]) 2018   Dr.Deveshwar, currently in normal range  . Migraines     Past Surgical History:  Procedure Laterality Date  . ABDOMINAL HYSTERECTOMY    . CESAREAN  SECTION  2000  . CYSTOSCOPY N/A 07/06/2016   Procedure: CYSTOSCOPY;  Surgeon: Nunzio Cobbs, MD;  Location: [facility];  Service: Gynecology;  Laterality: N/A;  . JOINT REPLACEMENT  L   Left hip replacement  . LAPAROSCOPIC GASTRIC SLEEVE RESECTION N/A 03/21/2018   Procedure: LAPAROSCOPIC GASTRIC SLEEVE RESECTION, UPPER ENDO, ERAS Pathway;  Surgeon: [name], MD;  Location: [facility];  Service: General;  Laterality: N/A;  . OOPHORECTOMY  2007   Rt.oophorectomy   . OVARIAN CYST SURGERY  ? and 10   rt removed,cyst off lft  . TOTAL HIP ARTHROPLASTY  07/06/2011   Procedure: TOTAL HIP ARTHROPLASTY;  Surgeon: Kerin Salen, MD;  Location: [facility];  Service: Orthopedics;  Laterality: Left;  DEPUY/PINNACLE  . TOTAL HIP ARTHROPLASTY Right 11/21/2018   Procedure: RIGHT TOTAL HIP ARTHROPLASTY ANTERIOR APPROACH;  Surgeon: Frederik Pear, MD;  Location[facility];  Service: Orthopedics;  Laterality: Right;  . TOTAL LAPAROSCOPIC HYSTERECTOMY WITH SALPINGECTOMY Left 07/06/2016   Procedure: HYSTERECTOMY TOTAL LAPAROSCOPIC WITH Left SALPINGECTOMY, Suture Left Side Wall Laceration;  Surgeon: Nunzio Cobbs, MD;  Location: [facility];  Service: Gynecology;  Laterality: Left;    Current Outpatient Medications  Medication Sig Dispense Refill  .  acetaminophen (TYLENOL) 500 MG tablet Take 1,000 mg by mouth every 6 (six) hours as needed for moderate pain.     . calcium carbonate (OS-CAL - DOSED IN MG OF ELEMENTAL CALCIUM) 1250 (500 Ca) MG tablet Take 500 mg by mouth 3 (three) times daily.    Marland Kitchen CAMBIA 50 MG PACK Take 50 mg by mouth daily as needed (migraine).   0  . Cholecalciferol (VITAMIN D) 125 MCG (5000 UT) CAPS Take 5,000 Units by mouth daily.    . diclofenac sodium (VOLTAREN) 1 % GEL Apply 3 gm to 3 large joints up to 3 times a day.Dispense 3 tubes with 3 refills. (Patient taking differently: Apply 2 g topically daily as needed (pain). ) 3 Tube 0  . fexofenadine (ALLEGRA ALLERGY) 180 MG tablet Take 180 mg  by mouth daily as needed (allergies).     . hydroxychloroquine (PLAQUENIL) 200 MG tablet Take 1 tablet (200 mg total) by mouth 2 (two) times daily. 180 tablet 0  . lidocaine (XYLOCAINE) 5 % ointment Apply 1 application topically 3 (three) times daily. Use as needed. 1.25 g 0  . Menthol, Topical Analgesic, (BIOFREEZE EX) Apply 1 application topically 2 (two) times daily as needed (pain).    . Multiple Vitamins-Minerals (BARIATRIC MULTIVITAMINS/IRON PO) Take 1 tablet by mouth 2 (two) times daily.    . Tetrahydrozoline HCl (VISINE OP) Place 1 drop into both eyes 3 (three) times daily as needed (for irritated/dry eyes.).    Marland Kitchen tiZANidine (ZANAFLEX) 2 MG tablet Take 1 tablet (2 mg total) by mouth every 6 (six) hours as needed. 60 tablet 0  . traMADol (ULTRAM) 50 MG tablet Take 1 tablet (50 mg total) by mouth every 6 (six) hours as needed for severe pain. 15 tablet 0   No current facility-administered medications for this visit.      ALLERGIES: Monosodium glutamate, Hydromorphone, Lactose intolerance (gi), Caffeine, Hydrocodone, and Oxycodone  Family History  Problem Relation Age of Onset  . Osteoarthritis Mother   . Osteoarthritis Brother   . Hypertension Brother   . Diabetes Brother   . Osteoarthritis Maternal Grandmother   . Arthritis Maternal Grandmother     Social History   Socioeconomic History  . Marital status: Single    Spouse name: Not on file  . Number of children: Not on file  . Years of education: Not on file  . Highest education level: Not on file  Occupational History  . Not on file  Social Needs  . Financial resource strain: Not on file  . Food insecurity    Worry: Not on file    Inability: Not on file  . Transportation needs    Medical: Not on file    Non-medical: Not on file  Tobacco Use  . Smoking status: Never Smoker  . Smokeless tobacco: Never Used  Substance and Sexual Activity  . Alcohol use: No    Alcohol/week: 0.0 standard drinks  . Drug use: No  .  Sexual activity: Yes    Partners: Male    Birth control/protection: Surgical    Comment: Hyst  Lifestyle  . Physical activity    Days per week: Not on file    Minutes per session: Not on file  . Stress: Not on file  Relationships  . Social Herbalist on phone: Not on file    Gets together: Not on file    Attends religious service: Not on file    Active member of club or organization:  Not on file    Attends meetings of clubs or organizations: Not on file    Relationship status: Not on file  . Intimate partner violence    Fear of current or ex partner: Not on file    Emotionally abused: Not on file    Physically abused: Not on file    Forced sexual activity: Not on file  Other Topics Concern  . Not on file  Social History Narrative   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[employer]      Home Situation: 61 yo daughter in 2018      Spiritual Beliefs:      Lifestyle: starting healthy low sugar diet 08/2016/[facility]    Review of Systems  All other systems reviewed and are negative.   PHYSICAL EXAMINATION:    BP 110/64 (Cuff Size: Large)   Pulse 60   Temp (!) 96.3 F (35.7 C) (Temporal)   Ht 5\' 10"  (1.778 m)   Wt 230 lb (104.3 kg)   LMP 04/29/2016 Comment: urine preg.negative 03/15/18  BMI 33.00 kg/m     General appearance: alert, cooperative and appears stated age   Pelvic: External genitalia:  Lesions of the clitoral hood are essentially gone.               Urethra:  normal appearing urethra with no masses, tenderness or lesions              Bartholins and Skenes: normal                 Vagina: normal appearing vagina with normal color and discharge, no lesions              Cervix: absent                Bimanual Exam:  Uterus:  absent              Adnexa: no mass, fullness, tenderness            Chaperone was present for exam.  ASSESSMENT  HSV II.   PLAN  We discussed HSV at length - route of transmission including asymptomatic  shedding, signs and symptoms, recurrent nature of infection, testing, and antiviral treatment.  Rx for Valtrex 500 mg po twice a day for 3 days as needed. STD screening. Condom use discussed.  Fu prn.    An After Visit Summary was printed and given to the patient.  __15____ minutes face to face time of which over 50% was spent in counseling.

## 2018-12-29 LAB — RPR, QUANT. (REFLEX): Rapid Plasma Reagin, Quant: 1:2 {titer} — ABNORMAL HIGH

## 2018-12-29 LAB — CERVICOVAGINAL ANCILLARY ONLY
Chlamydia: NEGATIVE
Comment: NEGATIVE
Comment: NEGATIVE
Comment: NORMAL
Neisseria Gonorrhea: NEGATIVE
Trichomonas: NEGATIVE

## 2018-12-29 LAB — HEP, RPR, HIV PANEL
HIV Screen 4th Generation wRfx: NONREACTIVE
Hepatitis B Surface Ag: NEGATIVE
RPR Ser Ql: REACTIVE — AB

## 2018-12-29 LAB — FECAL OCCULT BLOOD, IMMUNOCHEMICAL: Fecal Occult Bld: NEGATIVE

## 2018-12-29 LAB — HEPATITIS C ANTIBODY: Hep C Virus Ab: <0.1 s/co ratio (ref 0.0–0.9)

## 2019-01-02 ENCOUNTER — Telehealth: Payer: Self-pay | Admitting: *Deleted

## 2019-01-02 NOTE — Telephone Encounter (Signed)
-----   Message from Nunzio Cobbs, MD sent at 12/30/2018  3:44 PM EST ----- Please contact patient with results of her STD screening.  She came in this week for discussion of HSV II, and we completed her testing that day.  She tested negative for HIV, hepatitis B and C, gonorrhea, chlamydia, and trichomonas.   Her RPR screening test for syphilis is positive, and this may be a false positive due to her lupus.  The lab is processing a treponemal antibody test which is a more specific test for syphilis.  I want her to know that this is in process, and that I will reach out when this test is ready.

## 2019-01-02 NOTE — Telephone Encounter (Signed)
Notes recorded by Burnice Logan, RN on 01/02/2019 at 3:06 PM EST  Confirmed with [facility] tech, Lucy, treponemal antibody added to labs to be resulted by 01/04/19.

## 2019-01-02 NOTE — Telephone Encounter (Signed)
Notes recorded by Burnice Logan, RN on 01/02/2019 at 2:40 PM EST  Left message to call Sharee Pimple, RN at [facility].

## 2019-01-03 NOTE — Telephone Encounter (Signed)
Spoke with patient, advised of results as seen below per Dr. Quincy Simmonds.  Is aware she will be contacted with final results once they have been completed and reviewed by provider. Patient verbalizes understanding and is agreeable.  Encounter closed.

## 2019-01-03 NOTE — Telephone Encounter (Signed)
Left message to call Mourad Cwikla, RN at [facility] [phone].   

## 2019-01-03 NOTE — Telephone Encounter (Signed)
Patient returned call

## 2019-01-05 ENCOUNTER — Ambulatory Visit
Admission: RE | Admit: 2019-01-05 | Discharge: 2019-01-05 | Disposition: A | Discharge status: Home / Self Care | Payer: 59 | Source: Ambulatory Visit | Attending: Obstetrics and Gynecology | Admitting: Obstetrics and Gynecology

## 2019-01-05 ENCOUNTER — Other Ambulatory Visit: Payer: Self-pay

## 2019-01-05 ENCOUNTER — Other Ambulatory Visit: Payer: Self-pay | Admitting: Rheumatology

## 2019-01-05 DIAGNOSIS — Z1231 Encounter for screening mammogram for malignant neoplasm of breast: Secondary | ICD-10-CM | POA: Diagnosis not present

## 2019-01-05 NOTE — Telephone Encounter (Signed)
Last Visit: 09/06/2018 Next Visit: 02/07/2019  Okay to refill per Dr. Estanislado Pandy.

## 2019-01-10 ENCOUNTER — Encounter

## 2019-01-10 ENCOUNTER — Ambulatory Visit: Payer: 59 | Admitting: Obstetrics and Gynecology

## 2019-01-13 LAB — T.PALLIDUM AB, TOTAL: T Pallidum Abs: NONREACTIVE

## 2019-01-13 LAB — SPECIMEN STATUS REPORT

## 2019-01-31 MED FILL — traMADol HCL 50 MG TABS: 50 | 30 days supply | Qty: 60 | Fill #0

## 2019-02-07 ENCOUNTER — Ambulatory Visit: Payer: 59 | Admitting: Rheumatology

## 2019-02-28 MED FILL — traMADol HCL 50 MG TABS: 50 | 30 days supply | Qty: 60 | Fill #0

## 2019-03-03 NOTE — Progress Notes (Signed)
Office Visit Note  Patient: Julia Monroe             Date of Birth: 03/10/1970           MRN: [id number]             PCP: Billie Ruddy, MD Referring: Billie Ruddy, MD Visit Date: 03/07/2019 Occupation: '@GUAROCC'$ @  Subjective:  Medication monitoring.   History of Present Illness: Julia Monroe is a 49 y.o. female with history of systemic lupus dermatosis and osteoarthritis.  She states she had right total hip replacement in October 2020 with Dr. Mayer Camel.  She has recovered  well from the hip replacement.  She states her lupus is well controlled on Plaquenil.  She denies any history of oral ulcers, nasal ulcers, malar rash, photosensitivity, Raynaud's phenomenon or lymphadenopathy.  She could not have her eye examination last year.  She states her eye exam is scheduled for March 2021.  Activities of Daily Living:  Patient reports morning stiffness for 1 hour.   Patient Reports nocturnal pain.  Difficulty dressing/grooming: Denies Difficulty climbing stairs: Denies Difficulty getting out of chair: Denies Difficulty using hands for taps, buttons, cutlery, and/or writing: Denies  Review of Systems  Constitutional: Positive for fatigue. Negative for night sweats, weight gain and weight loss.  HENT: Negative for mouth sores, trouble swallowing, trouble swallowing, mouth dryness and nose dryness.   Eyes: Negative for pain, redness, itching, visual disturbance and dryness.  Respiratory: Negative for cough, shortness of breath, wheezing and difficulty breathing.   Cardiovascular: Negative for chest pain, palpitations, hypertension, irregular heartbeat and swelling in legs/feet.  Gastrointestinal: Negative for blood in stool, constipation and diarrhea.  Endocrine: Negative for increased urination.  Genitourinary: Negative for difficulty urinating, painful urination and vaginal dryness.  Musculoskeletal: Positive for arthralgias, joint pain and morning stiffness. Negative  for joint swelling, myalgias, muscle weakness, muscle tenderness and myalgias.  Skin: Negative for color change, rash, hair loss, skin tightness, ulcers and sensitivity to sunlight.  Allergic/Immunologic: Negative for susceptible to infections.  Neurological: Negative for dizziness, numbness, headaches, memory loss, night sweats and weakness.  Hematological: Negative for bruising/bleeding tendency and swollen glands.  Psychiatric/Behavioral: Negative for depressed mood, confusion and sleep disturbance. The patient is not nervous/anxious.     PMFS History:  Patient Active Problem List   Diagnosis Date Noted  . History of total hip arthroplasty, right 11/21/2018  . Prediabetes 03/21/2018  . SLE (systemic lupus erythematosus) ([place]) 03/21/2018  . Obesity 03/21/2018  . History of migraine 08/27/2017  . Class 3 severe obesity due to excess calories without serious comorbidity with body mass index (BMI) of 50.0 to 59.9 in adult ([place]) 01/27/2017  . Status post bilateral total hip replacement 10/27/2016  . Hyperglycemia 08/25/2016  . Leukocytosis 08/13/2016  . Status post laparoscopic hysterectomy 07/06/2016  . Primary osteoarthritis of both knees 04/29/2016  . ANA positive 04/29/2016  . Primary osteoarthritis of right hip 07/10/2011    Past Medical History:  Diagnosis Date  . Arthritis    osteoarthritis  . Complication of anesthesia    bottom of feet itching  . Elevated hemoglobin A1c 2018   6.1  . Herpes simplex type II infection 2020  . Lupus ([place]) 2018   Dr.Jennifer Holland, currently in normal range  . Migraines     Family History  Problem Relation Age of Onset  . Osteoarthritis Mother   . Osteoarthritis Brother   . Hypertension Brother   . Diabetes Brother   .  Osteoarthritis Maternal Grandmother   . Arthritis Maternal Grandmother    Past Surgical History:  Procedure Laterality Date  . ABDOMINAL HYSTERECTOMY    . CESAREAN SECTION  2000  . CYSTOSCOPY N/A 07/06/2016   Procedure:  CYSTOSCOPY;  Surgeon: Nunzio Cobbs, MD;  Location: [facility];  Service: Gynecology;  Laterality: N/A;  . JOINT REPLACEMENT  L   Left hip replacement  . LAPAROSCOPIC GASTRIC SLEEVE RESECTION N/A 03/21/2018   Procedure: LAPAROSCOPIC GASTRIC SLEEVE RESECTION, UPPER ENDO, ERAS Pathway;  Surgeon: [name], MD;  Location: [facility];  Service: General;  Laterality: N/A;  . OOPHORECTOMY  2007   Rt.oophorectomy   . OVARIAN CYST SURGERY  ? and 10   rt removed,cyst off lft  . TOTAL HIP ARTHROPLASTY  07/06/2011   Procedure: TOTAL HIP ARTHROPLASTY;  Surgeon: Kerin Salen, MD;  Location: [facility];  Service: Orthopedics;  Laterality: Left;  DEPUY/PINNACLE  . TOTAL HIP ARTHROPLASTY Right 11/21/2018   Procedure: RIGHT TOTAL HIP ARTHROPLASTY ANTERIOR APPROACH;  Surgeon: Frederik Pear, MD;  Location: [facility];  Service: Orthopedics;  Laterality: Right;  . TOTAL LAPAROSCOPIC HYSTERECTOMY WITH SALPINGECTOMY Left 07/06/2016   Procedure: HYSTERECTOMY TOTAL LAPAROSCOPIC WITH Left SALPINGECTOMY, Suture Left Side Wall Laceration;  Surgeon: Nunzio Cobbs, MD;  Location: [facility];  Service: Gynecology;  Laterality: Left;   Social History   Social History Narrative   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[employer]      Home Situation: 56 yo daughter in 2018      Spiritual Beliefs:      Lifestyle: starting healthy low sugar diet 08/2016/considering exercising at the [employer] History  Administered Date(s) Administered  . Influenza-Unspecified 10/26/2013, 10/26/2016  . Pneumococcal Conjugate-13 11/02/2016  . Tdap 01/26/2009, 12/20/2018     Objective: Vital Signs: BP 119/73 (BP Location: Left Arm, Patient Position: Sitting, Cuff Size: Large)   Pulse 60   Resp 14   Ht 5' 9.5" (1.765 m)   Wt 217 lb 3.2 oz (98.5 kg)   LMP 04/29/2016 Comment: urine preg.negative 03/15/18  BMI 31.61 kg/m    Physical Exam Vitals and nursing note reviewed.  Constitutional:      Appearance: She is well-developed.    HENT:     Head: Normocephalic and atraumatic.  Eyes:     Conjunctiva/sclera: Conjunctivae normal.  Cardiovascular:     Rate and Rhythm: Normal rate and regular rhythm.     Heart sounds: Normal heart sounds.  Pulmonary:     Effort: Pulmonary effort is normal.     Breath sounds: Normal breath sounds.  Abdominal:     General: Bowel sounds are normal.     Palpations: Abdomen is soft.  Musculoskeletal:     Cervical back: Normal range of motion.  Lymphadenopathy:     Cervical: No cervical adenopathy.  Skin:    General: Skin is warm and dry.     Capillary Refill: Capillary refill takes less than 2 seconds.  Neurological:     Mental Status: She is alert and oriented to person, place, and time.  Psychiatric:        Behavior: Behavior normal.      Musculoskeletal Exam: C-spine thoracic and lumbar spine with good range of motion.  Shoulder joints elbow joints wrist joint MCPs PIPs DIPs with good range of motion with no synovitis.  Hip joints knee joints ankles MTPs PIPs with good range of motion with no synovitis.  CDAI Exam: CDAI Score: -- Patient Global: --;  Provider Global: -- Swollen: --; Tender: -- Joint Exam 03/07/2019   No joint exam has been documented for this visit   There is currently no information documented on the homunculus. Go to the Rheumatology activity and complete the homunculus joint exam.  Investigation: No additional findings.  Imaging: No results found.  Recent Labs: Lab Results  Component Value Date   WBC 12.2 (H) 11/22/2018   HGB 10.7 (L) 11/22/2018   PLT 224 11/22/2018   NA 138 11/22/2018   K 4.1 11/22/2018   CL 106 11/22/2018   CO2 24 11/22/2018   GLUCOSE 122 (H) 11/22/2018   BUN 7 11/22/2018   CREATININE 0.46 11/22/2018   BILITOT 0.3 05/11/2018   ALKPHOS 49 05/11/2018   AST 14 05/11/2018   ALT 8 05/11/2018   PROT 7.0 05/11/2018   ALBUMIN 4.1 05/11/2018   CALCIUM 9.0 11/22/2018   GFRAA >60 11/22/2018  March 07, 2019 CBC, WBC 5.53,  hemoglobin 12.9, platelets 226, CMP normal, TSH normal  Speciality Comments: PLQ eye exam: 11/24/2016 Normal. Dr. Thalia Bloodgood. Follow up in 1 year.  Procedures:  No procedures performed Allergies: Monosodium glutamate, Hydromorphone, Lactose intolerance (gi), Caffeine, Hydrocodone, and Oxycodone   Assessment / Plan:     Visit Diagnoses: Other systemic lupus erythematosus with other organ involvement ([place]) - History of positive ANA, DS DNA, elevated ESR, arthralgias, rash -patient has been doing really well on Plaquenil.  She had no synovitis on examination.  She denies any recent episode of rash.  I will obtain labs today to monitor for disease process.  Plan: Urinalysis, Routine w reflex microscopic, Anti-DNA antibody, double-stranded, C3 and C4, Sedimentation rate, hydroxychloroquine (PLAQUENIL) 200 MG tablet  High risk medication use - PLQ 200 mg po bid. eye exam: 10/2017 per patient.  Next eye exam is scheduled for April 05, 2019.  Detailed counseling regarding ocular toxicity was provided.  Primary osteoarthritis of both knees-she is currently not having much discomfort in her knee joints.  Status post bilateral total hip replacement - Right total hip replacement October 2020 by Dr. Mayer Camel, [facility] 2013 by Dr. Mayer Camel  Vitamin D deficiency -she is history of vitamin D deficiency.  Plan: VITAMIN D 25 Hydroxy (Vit-D Deficiency, Fractures)  Other fatigue-she continues to have some fatigue. History of migraine  Orders: Orders Placed This Encounter  Procedures  . Urinalysis, Routine w reflex microscopic  . Anti-DNA antibody, double-stranded  . C3 and C4  . Sedimentation rate  . VITAMIN D 25 Hydroxy (Vit-D Deficiency, Fractures)   Meds ordered this encounter  Medications  . hydroxychloroquine (PLAQUENIL) 200 MG tablet    Sig: Take 1 tablet (200 mg total) by mouth 2 (two) times daily.    Dispense:  180 tablet    Refill:  0     Follow-Up Instructions: Return in about 5 months (around  08/04/2019) for Systemic lupus.   Bo Merino, MD  Note - This record has been created using Editor, commissioning.  Chart creation errors have been sought, but may not always  have been located. Such creation errors do not reflect on  the standard of medical care.

## 2019-03-07 ENCOUNTER — Other Ambulatory Visit: Payer: Self-pay

## 2019-03-07 ENCOUNTER — Encounter: Payer: Self-pay | Admitting: Rheumatology

## 2019-03-07 ENCOUNTER — Ambulatory Visit: Payer: 59 | Admitting: Rheumatology

## 2019-03-07 VITALS — BP 119/73 | HR 60 | Resp 14 | Ht 69.5 in | Wt 217.2 lb

## 2019-03-07 DIAGNOSIS — Z8669 Personal history of other diseases of the nervous system and sense organs: Secondary | ICD-10-CM

## 2019-03-07 DIAGNOSIS — Z79899 Other long term (current) drug therapy: Secondary | ICD-10-CM | POA: Diagnosis not present

## 2019-03-07 DIAGNOSIS — E559 Vitamin D deficiency, unspecified: Secondary | ICD-10-CM | POA: Diagnosis not present

## 2019-03-07 DIAGNOSIS — M3219 Other organ or system involvement in systemic lupus erythematosus: Secondary | ICD-10-CM

## 2019-03-07 DIAGNOSIS — M170 Bilateral primary osteoarthritis of knee: Secondary | ICD-10-CM

## 2019-03-07 DIAGNOSIS — Z96643 Presence of artificial hip joint, bilateral: Secondary | ICD-10-CM | POA: Diagnosis not present

## 2019-03-07 DIAGNOSIS — R5383 Other fatigue: Secondary | ICD-10-CM | POA: Diagnosis not present

## 2019-03-07 MED ORDER — HYDROXYCHLOROQUINE SULFATE 200 MG PO TABS: 200.0000 mg | ORAL_TABLET | Freq: Two times a day (BID) | ORAL | 0 refills | Status: DC

## 2019-03-07 MED FILL — HYDROXYCHLOROQUINE 200 MG T: 200 | 90 days supply | Qty: 180 | Fill #0

## 2019-03-08 LAB — SEDIMENTATION RATE: Sed Rate: 9 mm/h (ref 0–20)

## 2019-03-08 LAB — URINALYSIS, ROUTINE W REFLEX MICROSCOPIC
Bilirubin Urine: NEGATIVE
Glucose, UA: NEGATIVE
Hgb urine dipstick: NEGATIVE
Ketones, ur: NEGATIVE
Leukocytes,Ua: NEGATIVE
Nitrite: NEGATIVE
Protein, ur: NEGATIVE
Specific Gravity, Urine: 1.023 (ref 1.001–1.03)
pH: <=5 (ref 5.0–8.0)

## 2019-03-08 LAB — C3 AND C4
C3 Complement: 114 mg/dL (ref 83–193)
C4 Complement: 43 mg/dL (ref 15–57)

## 2019-03-08 LAB — ANTI-DNA ANTIBODY, DOUBLE-STRANDED: ds DNA Ab: 24 IU/mL — ABNORMAL HIGH

## 2019-03-08 LAB — VITAMIN D 25 HYDROXY (VIT D DEFICIENCY, FRACTURES): Vit D, 25-Hydroxy: 57 ng/mL (ref 30–100)

## 2019-03-08 NOTE — Progress Notes (Signed)
Double-stranded DNA is positive.  The other labs do not indicate active lupus at this time.

## 2019-03-14 ENCOUNTER — Encounter: Payer: 59 | Attending: General Surgery | Admitting: Skilled Nursing Facility1

## 2019-03-14 ENCOUNTER — Other Ambulatory Visit: Payer: Self-pay

## 2019-03-15 NOTE — Progress Notes (Signed)
Bariatric Class:  Appt start time: 6:00 end time: 7:00  12 Month Post-Operative Nutrition Class  Patient was seen on 03/14/2019 for Post-Operative Nutrition education [facility].    Body Composition Scale Date  Total Body Fat % 38.5  Visceral Fat 11  Fat-Free Mass % 61.4   Total Body Water % 45.2   Muscle-Mass lbs 31.2  Body Fat Displacement          Torso  lbs 46.5         Left Leg  lbs 9.3         Right Leg  lbs 9.3         Left Arm  lbs 4.6         Right Arm   lbs 4.6     The following the learning objectives were met by the patient during this course:  Review of TANITA scale information  Share and discuss bariatric surgery successes and non-scale victories  Identifies Phase VII (Maintenance Phase) Dietary Goals which will be lifelong  Identifies appropriate sources of fluids, proteins, non-starchy vegetables, and complex carbohydrates  Identifies well-balanced meals  Identifies portion control   Identifies appropriate multivitamin and calcium sources post-operatively  Describes the need for physical activity post-operatively and will follow MD recommendations  Identifies and describes SMART goals   Creates at least 2 SMART goals to begin immediately  States when to call healthcare provider regarding medication questions or post-operative complications  Handouts given during class include:  Phase VII: Maintenance Phase-Lifelong  Follow-Up Plan: Patient will follow-up at [facility] for on-going post-op nutrition visits.

## 2019-03-28 DIAGNOSIS — M1712 Unilateral primary osteoarthritis, left knee: Secondary | ICD-10-CM | POA: Diagnosis not present

## 2019-03-28 DIAGNOSIS — M25552 Pain in left hip: Secondary | ICD-10-CM | POA: Diagnosis not present

## 2019-03-28 MED FILL — traMADol HCL 50 MG TABS: 50 | 30 days supply | Qty: 90 | Fill #0

## 2019-04-17 LAB — HM DIABETES EYE EXAM

## 2019-04-18 DIAGNOSIS — H524 Presbyopia: Secondary | ICD-10-CM | POA: Diagnosis not present

## 2019-04-18 DIAGNOSIS — Z79899 Other long term (current) drug therapy: Secondary | ICD-10-CM | POA: Diagnosis not present

## 2019-04-18 DIAGNOSIS — H5203 Hypermetropia, bilateral: Secondary | ICD-10-CM | POA: Diagnosis not present

## 2019-04-18 DIAGNOSIS — H52223 Regular astigmatism, bilateral: Secondary | ICD-10-CM | POA: Diagnosis not present

## 2019-04-20 DIAGNOSIS — E669 Obesity, unspecified: Secondary | ICD-10-CM | POA: Diagnosis not present

## 2019-04-20 DIAGNOSIS — Z9884 Bariatric surgery status: Secondary | ICD-10-CM | POA: Diagnosis not present

## 2019-04-20 DIAGNOSIS — R7303 Prediabetes: Secondary | ICD-10-CM | POA: Diagnosis not present

## 2019-04-20 DIAGNOSIS — M329 Systemic lupus erythematosus, unspecified: Secondary | ICD-10-CM | POA: Diagnosis not present

## 2019-04-20 DIAGNOSIS — M1611 Unilateral primary osteoarthritis, right hip: Secondary | ICD-10-CM | POA: Diagnosis not present

## 2019-04-27 MED FILL — traMADol HCL 50 MG TABS: 50 | 30 days supply | Qty: 90 | Fill #1

## 2019-04-28 ENCOUNTER — Ambulatory Visit: Payer: 59 | Attending: Internal Medicine

## 2019-04-28 DIAGNOSIS — Z23 Encounter for immunization: Secondary | ICD-10-CM

## 2019-04-28 NOTE — Progress Notes (Signed)
   Covid-19 Vaccination Clinic  Name:  Julia Monroe    MRN: [id number] DOB: 12-01-1970  04/28/2019  [name] was observed post Covid-19 immunization for 15 minutes without incident. She was provided with Vaccine Information Sheet and instruction to access the V-Safe system.   [name] was instructed to call 911 with any severe reactions post vaccine: Marland Kitchen Difficulty breathing  . Swelling of face and throat  . A fast heartbeat  . A bad rash all over body  . Dizziness and weakness   Immunizations Administered    Name Date Dose VIS Date Route   Pfizer COVID-19 Vaccine 04/28/2019  3:06 PM 0.3 mL 01/06/2019 Intramuscular   Manufacturer: [employer], [employer]   Lot: [id number]   [place]: [id number]

## 2019-05-08 DIAGNOSIS — H527 Unspecified disorder of refraction: Secondary | ICD-10-CM | POA: Diagnosis not present

## 2019-05-08 DIAGNOSIS — Z79899 Other long term (current) drug therapy: Secondary | ICD-10-CM | POA: Diagnosis not present

## 2019-05-08 DIAGNOSIS — M329 Systemic lupus erythematosus, unspecified: Secondary | ICD-10-CM | POA: Diagnosis not present

## 2019-05-08 LAB — HM DIABETES EYE EXAM

## 2019-05-23 ENCOUNTER — Ambulatory Visit: Payer: 59 | Attending: Internal Medicine

## 2019-05-23 DIAGNOSIS — Z23 Encounter for immunization: Secondary | ICD-10-CM

## 2019-05-29 MED FILL — traMADol HCL 50 MG TABS: 50 | 30 days supply | Qty: 90 | Fill #2

## 2019-06-27 MED FILL — traMADol HCL 50 MG TABS: 50 | 30 days supply | Qty: 90 | Fill #0

## 2019-07-04 DIAGNOSIS — M1712 Unilateral primary osteoarthritis, left knee: Secondary | ICD-10-CM | POA: Diagnosis not present

## 2019-07-12 ENCOUNTER — Other Ambulatory Visit: Payer: Self-pay

## 2019-07-13 ENCOUNTER — Encounter: Payer: Self-pay | Admitting: Family Medicine

## 2019-07-13 ENCOUNTER — Ambulatory Visit (INDEPENDENT_AMBULATORY_CARE_PROVIDER_SITE_OTHER): Payer: 59 | Admitting: Family Medicine

## 2019-07-13 VITALS — BP 116/78 | HR 78 | Temp 97.8°F | Wt 221.0 lb

## 2019-07-13 DIAGNOSIS — R7303 Prediabetes: Secondary | ICD-10-CM

## 2019-07-13 DIAGNOSIS — L910 Hypertrophic scar: Secondary | ICD-10-CM

## 2019-07-13 DIAGNOSIS — Z6831 Body mass index (BMI) 31.0-31.9, adult: Secondary | ICD-10-CM | POA: Diagnosis not present

## 2019-07-13 DIAGNOSIS — Z0000 Encounter for general adult medical examination without abnormal findings: Secondary | ICD-10-CM | POA: Diagnosis not present

## 2019-07-13 DIAGNOSIS — Z8669 Personal history of other diseases of the nervous system and sense organs: Secondary | ICD-10-CM | POA: Diagnosis not present

## 2019-07-13 DIAGNOSIS — L600 Ingrowing nail: Secondary | ICD-10-CM | POA: Diagnosis not present

## 2019-07-13 LAB — POCT GLYCOSYLATED HEMOGLOBIN (HGB A1C): Hemoglobin A1C: 5.1 % (ref 4.0–5.6)

## 2019-07-13 MED ORDER — CAMBIA 50 MG PO PACK: 50.0000 mg | PACK | Freq: Every day | ORAL | 2 refills | Status: DC | PRN

## 2019-07-13 NOTE — Patient Instructions (Signed)
Preventive Care 47-47 Years Old, Female Preventive care refers to visits with your health care provider and lifestyle choices that can promote health and wellness. This includes:  A yearly physical exam. This may also be called an annual well check.  Regular dental visits and eye exams.  Immunizations.  Screening for certain conditions.  Healthy lifestyle choices, such as eating a healthy diet, getting regular exercise, not using drugs or products that contain nicotine and tobacco, and limiting alcohol use. What can I expect for my preventive care visit? Physical exam Your health care provider will check your:  Height and weight. This may be used to calculate body mass index (BMI), which tells if you are at a healthy weight.  Heart rate and blood pressure.  Skin for abnormal spots. Counseling Your health care provider may ask you questions about your:  Alcohol, tobacco, and drug use.  Emotional well-being.  Home and relationship well-being.  Sexual activity.  Eating habits.  Work and work environment.  Method of birth control.  Menstrual cycle.  Pregnancy history. What immunizations do I need?  Influenza (flu) vaccine  This is recommended every year. Tetanus, diphtheria, and pertussis (Tdap) vaccine  You may need a Td booster every 10 years. Varicella (chickenpox) vaccine  You may need this if you have not been vaccinated. Zoster (shingles) vaccine  You may need this after age 60. Measles, mumps, and rubella (MMR) vaccine  You may need at least one dose of MMR if you were born in 1957 or later. You may also need a second dose. Pneumococcal conjugate (PCV13) vaccine  You may need this if you have certain conditions and were not previously vaccinated. Pneumococcal polysaccharide (PPSV23) vaccine  You may need one or two doses if you smoke cigarettes or if you have certain conditions. Meningococcal conjugate (MenACWY) vaccine  You may need this if you  have certain conditions. Hepatitis A vaccine  You may need this if you have certain conditions or if you travel or work in places where you may be exposed to hepatitis A. Hepatitis B vaccine  You may need this if you have certain conditions or if you travel or work in places where you may be exposed to hepatitis B. Haemophilus influenzae type b (Hib) vaccine  You may need this if you have certain conditions. Human papillomavirus (HPV) vaccine  If recommended by your health care provider, you may need three doses over 6 months. You may receive vaccines as individual doses or as more than one vaccine together in one shot (combination vaccines). Talk with your health care provider about the risks and benefits of combination vaccines. What tests do I need? Blood tests  Lipid and cholesterol levels. These may be checked every 5 years, or more frequently if you are over 50 years old.  Hepatitis C test.  Hepatitis B test. Screening  Lung cancer screening. You may have this screening every year starting at age 55 if you have a 30-pack-year history of smoking and currently smoke or have quit within the past 15 years.  Colorectal cancer screening. All adults should have this screening starting at age 50 and continuing until age 75. Your health care provider may recommend screening at age 45 if you are at increased risk. You will have tests every 1-10 years, depending on your results and the type of screening test.  Diabetes screening. This is done by checking your blood sugar (glucose) after you have not eaten for a while (fasting). You may have this   done every 1-3 years.  Mammogram. This may be done every 1-2 years. Talk with your health care provider about when you should start having regular mammograms. This may depend on whether you have a family history of breast cancer.  BRCA-related cancer screening. This may be done if you have a family history of breast, ovarian, tubal, or peritoneal  cancers.  Pelvic exam and Pap test. This may be done every 3 years starting at age 22. Starting at age 53, this may be done every 5 years if you have a Pap test in combination with an HPV test. Other tests  Sexually transmitted disease (STD) testing.  Bone density scan. This is done to screen for osteoporosis. You may have this scan if you are at high risk for osteoporosis. Follow these instructions at home: Eating and drinking  Eat a diet that includes fresh fruits and vegetables, whole grains, lean protein, and low-fat dairy.  Take vitamin and mineral supplements as recommended by your health care provider.  Do not drink alcohol if: ? Your health care provider tells you not to drink. ? You are pregnant, may be pregnant, or are planning to become pregnant.  If you drink alcohol: ? Limit how much you have to 0-1 drink a day. ? Be aware of how much alcohol is in your drink. In the [place] one drink equals one 12 oz bottle of beer (355 mL), one 5 oz glass of wine (148 mL), or one 1 oz glass of hard liquor (44 mL). Lifestyle  Take daily care of your teeth and gums.  Stay active. Exercise for at least 30 minutes on 5 or more days each week.  Do not use any products that contain nicotine or tobacco, such as cigarettes, e-cigarettes, and chewing tobacco. If you need help quitting, ask your health care provider.  If you are sexually active, practice safe sex. Use a condom or other form of birth control (contraception) in order to prevent pregnancy and STIs (sexually transmitted infections).  If told by your health care provider, take low-dose aspirin daily starting at age 13. What's next?  Visit your health care provider once a year for a well check visit.  Ask your health care provider how often you should have your eyes and teeth checked.  Stay up to date on all vaccines. This information is not intended to replace advice given to you by your health care provider. Make sure you  discuss any questions you have with your health care provider. Document Revised: 09/23/2017 Document Reviewed: 09/23/2017 Elsevier Patient Education  2020 [employer] An ingrown toenail occurs when the corner or sides of a toenail grow into the surrounding skin. This causes discomfort and pain. The big toe is most commonly affected, but any of the toes can be affected. If an ingrown toenail is not treated, it can become infected. What are the causes? This condition may be caused by:  Wearing shoes that are too small or tight.  An injury, such as stubbing your toe or having your toe stepped on.  Improper cutting or care of your toenails.  Having nail or foot abnormalities that were present from birth (congenital abnormalities), such as having a nail that is too big for your toe. What increases the risk? The following factors may make you more likely to develop ingrown toenails:  Age. Nails tend to get thicker with age, so ingrown nails are more common among older people.  Cutting your toenails incorrectly, such as  cutting them very short or cutting them unevenly. An ingrown toenail is more likely to get infected if you have:  Diabetes.  Blood flow (circulation) problems. What are the signs or symptoms? Symptoms of an ingrown toenail may include:  Pain, soreness, or tenderness.  Redness.  Swelling.  Hardening of the skin that surrounds the toenail. Signs that an ingrown toenail may be infected include:  Fluid or pus.  Symptoms that get worse instead of better. How is this diagnosed? An ingrown toenail may be diagnosed based on your medical history, your symptoms, and a physical exam. If you have fluid or blood coming from your toenail, a sample may be collected to test for the specific type of bacteria that is causing the infection. How is this treated? Treatment depends on how severe your ingrown toenail is. You may be able to care for your toenail at  home.  If you have an infection, you may be prescribed antibiotic medicines.  If you have fluid or pus draining from your toenail, your health care provider may drain it.  If you have trouble walking, you may be given crutches to use.  If you have a severe or infected ingrown toenail, you may need a procedure to remove part or all of the nail. Follow these instructions at home: Foot care   Do not pick at your toenail or try to remove it yourself.  Soak your foot in warm, soapy water. Do this for 20 minutes, 3 times a day, or as often as told by your health care provider. This helps to keep your toe clean and keep your skin soft.  Wear shoes that fit well and are not too tight. Your health care provider may recommend that you wear open-toed shoes while you heal.  Trim your toenails regularly and carefully. Cut your toenails straight across to prevent injury to the skin at the corners of the toenail. Do not cut your nails in a curved shape.  Keep your feet clean and dry to help prevent infection. Medicines  Take over-the-counter and prescription medicines only as told by your health care provider.  If you were prescribed an antibiotic, take it as told by your health care provider. Do not stop taking the antibiotic even if you start to feel better. Activity  Return to your normal activities as told by your health care provider. Ask your health care provider what activities are safe for you.  Avoid activities that cause pain. General instructions  If your health care provider told you to use crutches to help you move around, use them as instructed.  Keep all follow-up visits as told by your health care provider. This is important. Contact a health care provider if:  You have more redness, swelling, pain, or other symptoms that do not improve with treatment.  You have fluid, blood, or pus coming from your toenail. Get help right away if:  You have a red streak on your skin that  starts at your foot and spreads up your leg.  You have a fever. Summary  An ingrown toenail occurs when the corner or sides of a toenail grow into the surrounding skin. This causes discomfort and pain. The big toe is most commonly affected, but any of the toes can be affected.  If an ingrown toenail is not treated, it can become infected.  Fluid or pus draining from your toenail is a sign of infection. Your health care provider may need to drain it. You may be given  antibiotics to treat the infection.  Trimming your toenails regularly and properly can help you prevent an ingrown toenail. This information is not intended to replace advice given to you by your health care provider. Make sure you discuss any questions you have with your health care provider. Document Revised: 05/06/2018 Document Reviewed: 09/30/2016 Elsevier Patient Education  [place].

## 2019-07-13 NOTE — Progress Notes (Addendum)
Subjective:     Julia Monroe is a 49 y.o. female and is here for a comprehensive physical exam. The patient reports problems - needs refill on migraine medicine, ingrown toenail, keloid.  Pt may have 1-2 migraines per year.  Requesting refill on Cambia 50 mg.  Pt requires about treatment for keloids on helix of right ear after having a bar piercing.  Pt does not typically keloid, but was told to continue turning the piercing after she got it done.  Pt also notes pain in L great toenail after getting a pedicure a few months ago.  Pt states the nail was cut down the side which provided relief, but her shoes for work often press against her toe causing pain.  Mammogram and pap up to date.  Social History   Socioeconomic History   Marital status: Single    Spouse name: Not on file   Number of children: Not on file   Years of education: Not on file   Highest education level: Not on file  Occupational History   Not on file  Tobacco Use   Smoking status: Never Smoker   Smokeless tobacco: Never Used  Vaping Use   Vaping Use: Never used  Substance and Sexual Activity   Alcohol use: No    Alcohol/week: 0.0 standard drinks   Drug use: No   Sexual activity: Yes    Partners: Male    Birth control/protection: Surgical    Comment: Hyst  Other Topics Concern   Not on file  Social History Narrative   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[employer]      Home Situation: 52 yo daughter in 2018      Spiritual Beliefs:      Lifestyle: starting healthy low sugar diet 08/2016/considering exercising at the [facility]   Financial Resource Strain:    Difficulty of Paying Living Expenses:   Food Insecurity:    Worried About [name] in the Last Year:    Arboriculturist in the Last Year:   Transportation Needs:    Film/video editor (Medical):    Lack of Transportation (Non-Medical):   Physical Activity:    Days of Exercise per Week:     Minutes of Exercise per Session:   Stress:    Feeling of Stress :   Social Connections:    Frequency of Communication with Friends and Family:    Frequency of Social Gatherings with Friends and Family:    Attends Religious Services:    Active Member of Clubs or Organizations:    Attends Music therapist:    Marital Status:   Intimate Partner Violence:    Fear of Current or Ex-Partner:    Emotionally Abused:    Physically Abused:    Sexually Abused:    Health Maintenance  Topic Date Due   INFLUENZA VACCINE  08/27/2019   PAP SMEAR-Modifier  01/25/2022   TETANUS/TDAP  12/19/2028   COVID-19 Vaccine  Completed   Hepatitis C Screening  Completed   HIV Screening  Completed    The following portions of the patient's history were reviewed and updated as appropriate: allergies, current medications, past family history, past medical history, past social history, past surgical history and problem list.  Review of Systems Pertinent items noted in HPI and remainder of comprehensive ROS otherwise negative.   Objective:    BP 116/78 (BP Location: Left Arm, Patient Position: Sitting, Cuff Size: Large)  Pulse 78    Temp 97.8 F (36.6 C) (Temporal)    Wt 221 lb (100.2 kg)    LMP 04/29/2016 Comment: urine preg.negative 03/15/18   SpO2 98%    BMI 35.67 kg/m  General appearance: alert, cooperative and no distress Head: Normocephalic, without obvious abnormality, atraumatic Eyes: conjunctivae/corneas clear. PERRL, EOM's intact. Fundi benign. Ears: normal TM's and external ear canals both ears.  2 keloids of R helix. Nose: Nares normal. Septum midline. Mucosa normal. No drainage or sinus tenderness. Throat: lips, mucosa, and tongue normal; teeth and gums normal Neck: no adenopathy, no carotid bruit, no JVD, supple, symmetrical, trachea midline and thyroid not enlarged, symmetric, no tenderness/mass/nodules Lungs: clear to auscultation bilaterally Heart: regular rate  and rhythm, S1, S2 normal, no murmur, click, rub or gallop Abdomen: soft, non-tender; bowel sounds normal; no masses,  no organomegaly Extremities: extremities normal, atraumatic, no cyanosis or edema Pulses: 2+ and symmetric Skin: Skin color, texture, turgor normal. No rashes or lesions keloid x2 right helix.  Left great toe medial edge with erythema and nail cut back along the side.  No drainage noted. Lymph nodes: Cervical, supraclavicular, and axillary nodes normal. Neurologic: Alert and oriented X 3, normal strength and tone. Normal symmetric reflexes. Normal coordination and gait    Assessment:    Healthy female exam with keloid of R ear and ingrow great  toenail of L foot.     Plan:     Anticipatory guidance given including wearing seatbelts, smoke detectors in the home, increasing physical activity, increasing p.o. intake of water and vegetables. -Hemoglobin A1c 5.1% this visit -will obtain labs.  Rx given so pt can have done at work. -mammogram up to date done 01/05/2019 -Pap smear done 12/2018 -Given handout -Next CPE in 1 year See After Visit Summary for Counseling Recommendations    Ingrown nail of great toe of left foot -Advised to avoid clipping nails along the sides to prevent future problems -Given handout - Plan: Ambulatory referral to Podiatry  Keloid -Helix of R ear -discussed treatment options.   -given contact info for derm.  BMI 31.0-31.9,adult  - Plan: POCT glycosylated hemoglobin (Hb A1C)  History of migraine  - Plan: CAMBIA 50 MG PACK  Prediabetes -Plan: POCT glycosylated hemoglobin  Follow-up as needed  Grier Mitts, MD

## 2019-07-21 ENCOUNTER — Ambulatory Visit: Payer: 59 | Admitting: Podiatry

## 2019-07-21 ENCOUNTER — Other Ambulatory Visit: Payer: Self-pay

## 2019-07-21 ENCOUNTER — Encounter: Payer: Self-pay | Admitting: Podiatry

## 2019-07-21 VITALS — BP 126/80 | HR 67

## 2019-07-21 DIAGNOSIS — M79675 Pain in left toe(s): Secondary | ICD-10-CM | POA: Diagnosis not present

## 2019-07-21 DIAGNOSIS — L600 Ingrowing nail: Secondary | ICD-10-CM | POA: Diagnosis not present

## 2019-07-21 NOTE — Patient Instructions (Addendum)
Soak Instructions    THE DAY AFTER THE PROCEDURE  Place 1/4 cup of epsom salts in a quart of warm tap water.  Submerge your foot or feet with outer bandage intact for the initial soak; this will allow the bandage to become moist and wet for easy lift off.  Once you remove your bandage, continue to soak in the solution for 20 minutes.  This soak should be done twice a day.  Next, remove your foot or feet from solution, blot dry the affected area and cover.  You may use a band aid large enough to cover the area or use gauze and tape.  Apply other medications to the area as directed by the doctor such as polysporin neosporin.  IF YOUR SKIN BECOMES IRRITATED WHILE USING THESE INSTRUCTIONS, IT IS OKAY TO SWITCH TO  WHITE VINEGAR AND WATER. Or you may use antibacterial soap and water to keep the toe clean  Monitor for any signs/symptoms of infection. Call the office immediately if any occur or go directly to the emergency room. Call with any questions/concerns.    [place] Instructions-Post Nail Surgery  You have had your ingrown toenail and root treated with a chemical.  This chemical causes a burn that will drain and ooze like a blister.  This can drain for 6-8 weeks or longer.  It is important to keep this area clean, covered, and follow the soaking instructions dispensed at the time of your surgery.  This area will eventually dry and form a scab.  Once the scab forms you no longer need to soak or apply a dressing.  If at any time you experience an increase in pain, redness, swelling, or drainage, you should contact the office as soon as possible.    Ingrown Toenail An ingrown toenail occurs when the corner or sides of a toenail grow into the surrounding skin. This causes discomfort and pain. The big toe is most commonly affected, but any of the toes can be affected. If an ingrown toenail is not treated, it can become infected. What are the causes? This condition may be caused  by:  Wearing shoes that are too small or tight.  An injury, such as stubbing your toe or having your toe stepped on.  Improper cutting or care of your toenails.  Having nail or foot abnormalities that were present from birth (congenital abnormalities), such as having a nail that is too big for your toe. What increases the risk? The following factors may make you more likely to develop ingrown toenails:  Age. Nails tend to get thicker with age, so ingrown nails are more common among older people.  Cutting your toenails incorrectly, such as cutting them very short or cutting them unevenly. An ingrown toenail is more likely to get infected if you have:  Diabetes.  Blood flow (circulation) problems. What are the signs or symptoms? Symptoms of an ingrown toenail may include:  Pain, soreness, or tenderness.  Redness.  Swelling.  Hardening of the skin that surrounds the toenail. Signs that an ingrown toenail may be infected include:  Fluid or pus.  Symptoms that get worse instead of better. How is this diagnosed? An ingrown toenail may be diagnosed based on your medical history, your symptoms, and a physical exam. If you have fluid or blood coming from your toenail, a sample may be collected to test for the specific type of bacteria that is causing the infection. How is this treated? Treatment depends on how severe your  ingrown toenail is. You may be able to care for your toenail at home.  If you have an infection, you may be prescribed antibiotic medicines.  If you have fluid or pus draining from your toenail, your health care provider may drain it.  If you have trouble walking, you may be given crutches to use.  If you have a severe or infected ingrown toenail, you may need a procedure to remove part or all of the nail. Follow these instructions at home: Foot care   Do not pick at your toenail or try to remove it yourself.  Soak your foot in warm, soapy water. Do this  for 20 minutes, 3 times a day, or as often as told by your health care provider. This helps to keep your toe clean and keep your skin soft.  Wear shoes that fit well and are not too tight. Your health care provider may recommend that you wear open-toed shoes while you heal.  Trim your toenails regularly and carefully. Cut your toenails straight across to prevent injury to the skin at the corners of the toenail. Do not cut your nails in a curved shape.  Keep your feet clean and dry to help prevent infection. Medicines  Take over-the-counter and prescription medicines only as told by your health care provider.  If you were prescribed an antibiotic, take it as told by your health care provider. Do not stop taking the antibiotic even if you start to feel better. Activity  Return to your normal activities as told by your health care provider. Ask your health care provider what activities are safe for you.  Avoid activities that cause pain. General instructions  If your health care provider told you to use crutches to help you move around, use them as instructed.  Keep all follow-up visits as told by your health care provider. This is important. Contact a health care provider if:  You have more redness, swelling, pain, or other symptoms that do not improve with treatment.  You have fluid, blood, or pus coming from your toenail. Get help right away if:  You have a red streak on your skin that starts at your foot and spreads up your leg.  You have a fever. Summary  An ingrown toenail occurs when the corner or sides of a toenail grow into the surrounding skin. This causes discomfort and pain. The big toe is most commonly affected, but any of the toes can be affected.  If an ingrown toenail is not treated, it can become infected.  Fluid or pus draining from your toenail is a sign of infection. Your health care provider may need to drain it. You may be given antibiotics to treat the  infection.  Trimming your toenails regularly and properly can help you prevent an ingrown toenail. This information is not intended to replace advice given to you by your health care provider. Make sure you discuss any questions you have with your health care provider. Document Revised: 05/06/2018 Document Reviewed: 09/30/2016 Elsevier Patient Education  [place].

## 2019-07-22 NOTE — Progress Notes (Addendum)
Subjective:  Patient ID: Julia Monroe, female    DOB: [birth year],  MRN: [id number]  Chief Complaint  Patient presents with  . Ingrown Toenail    left great toe    49 y.o. female presents with the above complaint.  Present and worsening for couple weeks.  She gets pedicures, and the pedicurist often has to take out the corner.  Pedicurist tells her that there is a hard bunch of skin under the nail and this is what is causing her pain.  Both genitals can be bothersome but the medial border of the left great toe is the worst.  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   Review of Systems: Negative except as noted in the HPI. Denies N/V/F/Ch.  Past Medical History:  Diagnosis Date  . Arthritis    osteoarthritis  . Complication of anesthesia    bottom of feet itching  . Elevated hemoglobin A1c 2018   6.1  . Herpes simplex type II infection 2020  . Lupus ([place]) 2018   Dr.Deveshwar, currently in normal range  . Migraines     Current Outpatient Medications:  .  acetaminophen (TYLENOL) 500 MG tablet, Take 1,000 mg by mouth every 6 (six) hours as needed for moderate pain. , Disp: , Rfl:  .  betamethasone acetate-betamethasone sodium phosphate (CELESTONE) 6 (3-3) MG/ML injection, Inject 1 vial    10 CC (EQUAL PARTS BETAMETHASONE, MARCAINE, AND 2% LIDOCAINE) INTO LEFT KNEE JOINT, Disp: , Rfl:  .  calcium carbonate (OS-CAL - DOSED IN MG OF ELEMENTAL CALCIUM) 1250 (500 Ca) MG tablet, Take 500 mg by mouth 3 (three) times daily., Disp: , Rfl:  .  CAMBIA 50 MG PACK, Take 50 mg by mouth daily as needed (migraine)., Disp: 1 each, Rfl: 2 .  CASCARA SAGRADA PO, Take by mouth daily., Disp: , Rfl:  .  Cholecalciferol (VITAMIN D) 125 MCG (5000 UT) CAPS, Take 5,000 Units by mouth daily., Disp: , Rfl:  .  diclofenac Sodium (VOLTAREN) 1 % GEL, Apply 2-4 grams topically to affected joint up to 4 times daily., Disp: 400 g, Rfl: 0 .  fexofenadine (ALLEGRA ALLERGY) 180 MG  tablet, Take 180 mg by mouth daily as needed (allergies). , Disp: , Rfl:  .  hydroxychloroquine (PLAQUENIL) 200 MG tablet, Take 1 tablet (200 mg total) by mouth 2 (two) times daily., Disp: 180 tablet, Rfl: 0 .  lidocaine (XYLOCAINE) 5 % ointment, Apply 1 application topically 3 (three) times daily. Use as needed., Disp: 1.25 g, Rfl: 0 .  Menthol, Topical Analgesic, (BIOFREEZE EX), Apply 1 application topically 2 (two) times daily as needed (pain)., Disp: , Rfl:  .  Multiple Vitamins-Minerals (BARIATRIC MULTIVITAMINS/IRON PO), Take 1 tablet by mouth 2 (two) times daily., Disp: , Rfl:  .  Tetrahydrozoline HCl (VISINE OP), Place 1 drop into both eyes 3 (three) times daily as needed (for irritated/dry eyes.)., Disp: , Rfl:  .  tiZANidine (ZANAFLEX) 2 MG tablet, Take 1 tablet (2 mg total) by mouth every 6 (six) hours as needed., Disp: 60 tablet, Rfl: 0 .  traMADol (ULTRAM) 50 MG tablet, Take 1 tablet (50 mg total) by mouth every 6 (six) hours as needed for severe pain., Disp: 15 tablet, Rfl: 0  Social History   Tobacco Use  Smoking Status Never Smoker  Smokeless Tobacco Never Used    Allergies  Allergen Reactions  . Monosodium Glutamate Other (See Comments)    Loses vision, headache/migraine  . Hydromorphone  Makes patient "Loopy" and dreams terrible things  . Lactose Intolerance (Gi) Other (See Comments)    Gi upset  . Caffeine Other (See Comments)    Severe headache   . Hydrocodone Other (See Comments)    Headaches/migraines   . Oxycodone Other (See Comments)    Headache/Migraine   Objective:   Vitals:   07/21/19 1420  BP: 126/80  Pulse: 67   There is no height or weight on file to calculate BMI. Constitutional Well developed. Well nourished.  Vascular Dorsalis pedis pulses palpable bilaterally. Posterior tibial pulses palpable bilaterally. Capillary refill normal to all digits.  No cyanosis or clubbing noted. Pedal hair growth normal.  Neurologic Normal  speech. Oriented to person, place, and time. Epicritic sensation to light touch grossly present bilaterally.  Dermatologic Painful ingrowing nail at medial nail borders of the hallux nail left. No other open wounds. No skin lesions.  Orthopedic: Normal joint ROM without pain or crepitus bilaterally. No visible deformities. No bony tenderness.   Radiographs: None Assessment:  No diagnosis found. Plan:  Patient was evaluated and treated and all questions answered.  Ingrown Nail, left -Patient elects to proceed with minor surgery to remove ingrown toenail today. Consent reviewed and signed by patient. -Ingrown nail excised. See procedure note. -Educated on post-procedure care including soaking. Written instructions provided and reviewed. -Patient to follow up in 2 weeks for nail check.  Procedure: Excision of Ingrown Toenail Location: Left 1st toe medial nail borders. Anesthesia: Lidocaine 1% plain; 1.5 mL and Marcaine 0.5% plain; 1.5 mL, digital block. Skin Prep: Betadine. Dressing: Silvadene; telfa; dry, sterile, compression dressing. Technique: Following skin prep, the toe was exsanguinated and a tourniquet was secured at the base of the toe. The affected nail border was freed, split with a nail splitter, and excised. Chemical matrixectomy was then performed with phenol and irrigated out with alcohol. The tourniquet was then removed and sterile dressing applied. Disposition: Patient tolerated procedure well. Patient to return in 2 weeks for follow-up.    Lanae Crumbly, DPM 07/22/2019   Return in about 2 weeks (around 08/04/2019) for left nail check.

## 2019-07-24 NOTE — Progress Notes (Signed)
Office Visit Note  Patient: Julia Monroe             Date of Birth: [birth year]           MRN: [id number]             PCP: Billie Ruddy, MD Referring: Billie Ruddy, MD Visit Date: 08/01/2019 Occupation: '@GUAROCC'$ @  Subjective:  Other (left arm pain )   History of Present Illness: Julia Monroe is a 49 y.o. female with history of systemic lupus dermatosis and osteoarthritis.  She states she has been tolerating Plaquenil well and she has not experienced any rash since the last visit.  She denies any joint swelling.  There is no history of oral ulcers, nasal ulcers, malar rash, photosensitivity, Raynaud's or sicca symptoms.  She states recently she has been experiencing bilateral trapezius spasm which she relates to her work.  She was also gardening and putting mulch in her yard and she has been going pain and discomfort in her left elbow which she describes over left lateral epicondyle region.  Activities of Daily Living:  Patient reports morning stiffness for 24  hours.   Patient Reports nocturnal pain.  Difficulty dressing/grooming: Denies Difficulty climbing stairs: Denies Difficulty getting out of chair: Denies Difficulty using hands for taps, buttons, cutlery, and/or writing: Denies  Review of Systems  Constitutional: Negative for fatigue, night sweats, weight gain and weight loss.  HENT: Negative for mouth sores, trouble swallowing, trouble swallowing, mouth dryness and nose dryness.   Eyes: Negative for pain, redness, itching, visual disturbance and dryness.  Respiratory: Negative for cough, shortness of breath and difficulty breathing.   Cardiovascular: Negative for chest pain, palpitations, hypertension, irregular heartbeat and swelling in legs/feet.  Gastrointestinal: Negative for blood in stool, constipation and diarrhea.  Endocrine: Negative for increased urination.  Genitourinary: Negative for difficulty urinating and vaginal dryness.    Musculoskeletal: Positive for arthralgias, joint pain and morning stiffness. Negative for joint swelling, myalgias, muscle weakness, muscle tenderness and myalgias.  Skin: Negative for color change, rash, hair loss, redness, skin tightness, ulcers and sensitivity to sunlight.  Allergic/Immunologic: Negative for susceptible to infections.  Neurological: Positive for numbness. Negative for dizziness, headaches, memory loss, night sweats and weakness.  Hematological: Negative for bruising/bleeding tendency and swollen glands.  Psychiatric/Behavioral: Negative for depressed mood, confusion and sleep disturbance. The patient is not nervous/anxious.     PMFS History:  Patient Active Problem List   Diagnosis Date Noted  . History of total hip arthroplasty, right 11/21/2018  . Prediabetes 03/21/2018  . SLE (systemic lupus erythematosus) ([place]) 03/21/2018  . Obesity 03/21/2018  . History of migraine 08/27/2017  . Class 3 severe obesity due to excess calories without serious comorbidity with body mass index (BMI) of 50.0 to 59.9 in adult ([place]) 01/27/2017  . Status post bilateral total hip replacement 10/27/2016  . Hyperglycemia 08/25/2016  . Leukocytosis 08/13/2016  . Status post laparoscopic hysterectomy 07/06/2016  . Primary osteoarthritis of both knees 04/29/2016  . ANA positive 04/29/2016  . Primary osteoarthritis of right hip 07/10/2011    Past Medical History:  Diagnosis Date  . Arthritis    osteoarthritis  . Complication of anesthesia    bottom of feet itching  . Elevated hemoglobin A1c 2018   6.1  . Herpes simplex type II infection 2020  . Lupus ([place]) 2018   Dr.Rayshad Riviello, currently in normal range  . Migraines     Family History  Problem Relation Age of Onset  .  Osteoarthritis Mother   . Osteoarthritis Brother   . Hypertension Brother   . Diabetes Brother   . Osteoarthritis Maternal Grandmother   . Arthritis Maternal Grandmother    Past Surgical History:  Procedure  Laterality Date  . ABDOMINAL HYSTERECTOMY    . CESAREAN SECTION  2000  . CYSTOSCOPY N/A 07/06/2016   Procedure: CYSTOSCOPY;  Surgeon: Patton Salles, MD;  Location: [facility];  Service: Gynecology;  Laterality: N/A;  . ingrown toenail Left 07/28/2019  . JOINT REPLACEMENT  L   Left hip replacement  . LAPAROSCOPIC GASTRIC SLEEVE RESECTION N/A 03/21/2018   Procedure: LAPAROSCOPIC GASTRIC SLEEVE RESECTION, UPPER ENDO, ERAS Pathway;  Surgeon: Gaynelle Adu, MD;  Location: [facility];  Service: General;  Laterality: N/A;  . OOPHORECTOMY  2007   Rt.oophorectomy   . OVARIAN CYST SURGERY  ? and 10   rt removed,cyst off lft  . TOTAL HIP ARTHROPLASTY  07/06/2011   Procedure: TOTAL HIP ARTHROPLASTY;  Surgeon: Nestor Lewandowsky, MD;  Location: [facility];  Service: Orthopedics;  Laterality: Left;  DEPUY/PINNACLE  . TOTAL HIP ARTHROPLASTY Right 11/21/2018   Procedure: RIGHT TOTAL HIP ARTHROPLASTY ANTERIOR APPROACH;  Surgeon: Gean Birchwood, MD;  Location: [facility];  Service: Orthopedics;  Laterality: Right;  . TOTAL LAPAROSCOPIC HYSTERECTOMY WITH SALPINGECTOMY Left 07/06/2016   Procedure: HYSTERECTOMY TOTAL LAPAROSCOPIC WITH Left SALPINGECTOMY, Suture Left Side Wall Laceration;  Surgeon: Patton Salles, MD;  Location: [facility];  Service: Gynecology;  Laterality: Left;   Social History   Social History Narrative   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[employer]      Home Situation: 5 yo daughter in 2018      Spiritual Beliefs:      Lifestyle: starting healthy low sugar diet 08/2016/considering exercising at the [employer] History  Administered Date(s) Administered  . Influenza-Unspecified 10/26/2013, 10/26/2016  . PFIZER SARS-COV-2 Vaccination 04/28/2019, 05/23/2019  . Pneumococcal Conjugate-13 11/02/2016  . Tdap 01/26/2009, 12/20/2018     Objective: Vital Signs: BP 132/74 (BP Location: Right Arm, Patient Position: Sitting, Cuff Size: Normal)   Pulse 78   Resp 15   Ht 5\' 9"  (1.753 m)   Wt 217 lb  (98.4 kg) Comment: per patient, patient refused to weigh  LMP 04/29/2016 Comment: urine preg.negative 03/15/18  BMI 32.05 kg/m    Physical Exam Vitals and nursing note reviewed.  Constitutional:      Appearance: She is well-developed.  HENT:     Head: Normocephalic and atraumatic.  Eyes:     Conjunctiva/sclera: Conjunctivae normal.  Cardiovascular:     Rate and Rhythm: Normal rate and regular rhythm.     Heart sounds: Normal heart sounds.  Pulmonary:     Effort: Pulmonary effort is normal.     Breath sounds: Normal breath sounds.  Abdominal:     General: Bowel sounds are normal.     Palpations: Abdomen is soft.  Musculoskeletal:     Cervical back: Normal range of motion.  Lymphadenopathy:     Cervical: No cervical adenopathy.  Skin:    General: Skin is warm and dry.     Capillary Refill: Capillary refill takes less than 2 seconds.  Neurological:     Mental Status: She is alert and oriented to person, place, and time.  Psychiatric:        Behavior: Behavior normal.      Musculoskeletal Exam: C-spine, thoracic and lumbar spine with good range of motion.  Shoulder joints, elbow joints, wrist  joints, MCPs, PIPs and DIPs with good range of motion with no synovitis.  Hip joints, knee joints, ankles, MTPs PIPs and DIPs with good range of motion with no synovitis.  She had tenderness on palpation of bilateral trapezius area.  She had tenderness over left lateral epicondyle consistent with lateral epicondylitis.  CDAI Exam: CDAI Score: -- Patient Global: --; Provider Global: -- Swollen: --; Tender: -- Joint Exam 08/01/2019   No joint exam has been documented for this visit   There is currently no information documented on the homunculus. Go to the Rheumatology activity and complete the homunculus joint exam.  Investigation: No additional findings.  Imaging: No results found.  Recent Labs: Lab Results  Component Value Date   WBC 12.2 (H) 11/22/2018   HGB 10.7 (L)  11/22/2018   PLT 224 11/22/2018   NA 138 11/22/2018   K 4.1 11/22/2018   CL 106 11/22/2018   CO2 24 11/22/2018   GLUCOSE 122 (H) 11/22/2018   BUN 7 11/22/2018   CREATININE 0.46 11/22/2018   BILITOT 0.3 05/11/2018   ALKPHOS 49 05/11/2018   AST 14 05/11/2018   ALT 8 05/11/2018   PROT 7.0 05/11/2018   ALBUMIN 4.1 05/11/2018   CALCIUM 9.0 11/22/2018   GFRAA >60 11/22/2018   March 07, 2019 UA negative, C3-C4 normal, double-stranded ENA 24, ESR 9, vitamin D 57 Speciality Comments: PLQ eye exam: 11/24/2016 Normal. Dr. Thalia Bloodgood. Follow up in 1 year.  Procedures:  No procedures performed Allergies: Monosodium glutamate, Hydromorphone, Lactose intolerance (gi), Caffeine, Hydrocodone, and Oxycodone   Assessment / Plan:     Visit Diagnoses: Other systemic lupus erythematosus with other organ involvement ([place]) - History of positive ANA, DS DNA, elevated ESR, arthralgias, rash -patient has been doing well on Plaquenil.  Her fatigue and rash has improved.  She has no synovitis.  We will continue current treatment.  Prescription refill for Plaquenil was given.  I will check her autoimmune labs today.  Plan: hydroxychloroquine (PLAQUENIL) 200 MG tablet, Urinalysis, Routine w reflex microscopic, Anti-DNA antibody, double-stranded, C3 and C4, Sedimentation rate, Urinalysis, Routine w reflex microscopic,   High risk medication use - PLQ 200 mg po bid. PLQ eye exam: 11/24/2016  - Plan: CBC with Differential/Platelet, COMPLETE METABOLIC PANEL WITH GFR, today and then every 5 months.  Patient states she had eye examination in February 2021.  We will request the report.  Trapezius muscle spasm-she has bilateral trapezius spasm.  Stretching exercises were discussed and demonstrated in the office.  Lateral epicondylitis, left elbow-I have given her a handout on exercises.  I have also given her prescription for Voltaren gel which can be used topically.  If her symptoms persist then we may consider doing  steroid injection.  Primary osteoarthritis of both knees-she has mild discomfort currently.  Status post bilateral total hip replacement - Right total hip replacement October 2020 by Dr. Mayer Camel, [facility] 2013 by Dr. Mayer Camel  Vitamin D deficiency-her last vitamin D level was normal.  She has been taking vitamin D supplement.  History of migraine  Other fatigue-improved.  Orders: Orders Placed This Encounter  Procedures  . CBC with Differential/Platelet  . COMPLETE METABOLIC PANEL WITH GFR  . Urinalysis, Routine w reflex microscopic  . Anti-DNA antibody, double-stranded  . C3 and C4  . Sedimentation rate  . CBC with Differential/Platelet  . COMPLETE METABOLIC PANEL WITH GFR  . Urinalysis, Routine w reflex microscopic  . ANA  . Anti-DNA antibody, double-stranded  . C3 and C4  .  Sedimentation rate  . VITAMIN D 25 Hydroxy (Vit-D Deficiency, Fractures)   Meds ordered this encounter  Medications  . hydroxychloroquine (PLAQUENIL) 200 MG tablet    Sig: Take 1 tablet (200 mg total) by mouth 2 (two) times daily.    Dispense:  180 tablet    Refill:  0  . diclofenac Sodium (VOLTAREN) 1 % GEL    Sig: Apply 2-4 grams topically to affected joint up to 4 times daily.    Dispense:  400 g    Refill:  0     Follow-Up Instructions: Return in about 5 months (around 01/01/2020) for Systemic lupus.   Bo Merino, MD  Note - This record has been created using Editor, commissioning.  Chart creation errors have been sought, but may not always  have been located. Such creation errors do not reflect on  the standard of medical care.

## 2019-07-28 HISTORY — PX: OTHER SURGICAL HISTORY: SHX169

## 2019-08-01 ENCOUNTER — Other Ambulatory Visit: Payer: Self-pay

## 2019-08-01 ENCOUNTER — Ambulatory Visit: Payer: 59 | Admitting: Rheumatology

## 2019-08-01 ENCOUNTER — Encounter: Payer: Self-pay | Admitting: Rheumatology

## 2019-08-01 VITALS — BP 132/74 | HR 78 | Resp 15 | Ht 69.0 in | Wt 217.0 lb

## 2019-08-01 DIAGNOSIS — M7712 Lateral epicondylitis, left elbow: Secondary | ICD-10-CM | POA: Diagnosis not present

## 2019-08-01 DIAGNOSIS — M170 Bilateral primary osteoarthritis of knee: Secondary | ICD-10-CM | POA: Diagnosis not present

## 2019-08-01 DIAGNOSIS — M3219 Other organ or system involvement in systemic lupus erythematosus: Secondary | ICD-10-CM | POA: Diagnosis not present

## 2019-08-01 DIAGNOSIS — Z8669 Personal history of other diseases of the nervous system and sense organs: Secondary | ICD-10-CM | POA: Diagnosis not present

## 2019-08-01 DIAGNOSIS — Z79899 Other long term (current) drug therapy: Secondary | ICD-10-CM

## 2019-08-01 DIAGNOSIS — R5383 Other fatigue: Secondary | ICD-10-CM | POA: Diagnosis not present

## 2019-08-01 DIAGNOSIS — Z96643 Presence of artificial hip joint, bilateral: Secondary | ICD-10-CM

## 2019-08-01 DIAGNOSIS — E559 Vitamin D deficiency, unspecified: Secondary | ICD-10-CM | POA: Diagnosis not present

## 2019-08-01 DIAGNOSIS — M62838 Other muscle spasm: Secondary | ICD-10-CM | POA: Diagnosis not present

## 2019-08-01 MED ORDER — HYDROXYCHLOROQUINE SULFATE 200 MG PO TABS: 200.0000 mg | ORAL_TABLET | Freq: Two times a day (BID) | ORAL | 0 refills | Status: DC

## 2019-08-01 MED ORDER — DICLOFENAC SODIUM 1 % EX GEL: CUTANEOUS | 0 refills | Status: DC

## 2019-08-01 MED FILL — traMADol HCL 50 MG TABS: 50 | 30 days supply | Qty: 90 | Fill #0

## 2019-08-01 MED FILL — HYDROXYCHLOROQUINE 200 MG T: 200 | 90 days supply | Qty: 180 | Fill #0

## 2019-08-01 MED FILL — DICLOFENAC SODIUM 1 % GEL: 1 | 25 days supply | Qty: 400 | Fill #0

## 2019-08-01 NOTE — Patient Instructions (Addendum)
Cervical Strain and Sprain Rehab Ask your health care provider which exercises are safe for you. Do exercises exactly as told by your health care provider and adjust them as directed. It is normal to feel mild stretching, pulling, tightness, or discomfort as you do these exercises. Stop right away if you feel sudden pain or your pain gets worse. Do not begin these exercises until told by your health care provider. Stretching and range-of-motion exercises Cervical side bending  1. Using good posture, sit on a stable chair or stand up. 2. Without moving your shoulders, slowly tilt your left / right ear to your shoulder until you feel a stretch in the opposite side neck muscles. You should be looking straight ahead. 3. Hold for __________ seconds. 4. Repeat with the other side of your neck. Repeat __________ times. Complete this exercise __________ times a day. Cervical rotation  1. Using good posture, sit on a stable chair or stand up. 2. Slowly turn your head to the side as if you are looking over your left / right shoulder. ? Keep your eyes level with the ground. ? Stop when you feel a stretch along the side and the back of your neck. 3. Hold for __________ seconds. 4. Repeat this by turning to your other side. Repeat __________ times. Complete this exercise __________ times a day. Thoracic extension and pectoral stretch 1. Roll a towel or a small blanket so it is about 4 inches (10 cm) in diameter. 2. Lie down on your back on a firm surface. 3. Put the towel lengthwise, under your spine in the middle of your back. It should not be under your shoulder blades. The towel should line up with your spine from your middle back to your lower back. 4. Put your hands behind your head and let your elbows fall out to your sides. 5. Hold for __________ seconds. Repeat __________ times. Complete this exercise __________ times a day. Strengthening exercises Isometric upper cervical flexion 1. Lie on  your back with a thin pillow behind your head and a small rolled-up towel under your neck. 2. Gently tuck your chin toward your chest and nod your head down to look toward your feet. Do not lift your head off the pillow. 3. Hold for __________ seconds. 4. Release the tension slowly. Relax your neck muscles completely before you repeat this exercise. Repeat __________ times. Complete this exercise __________ times a day. Isometric cervical extension  1. Stand about 6 inches (15 cm) away from a wall, with your back facing the wall. 2. Place a soft object, about 6-8 inches (15-20 cm) in diameter, between the back of your head and the wall. A soft object could be a small pillow, a ball, or a folded towel. 3. Gently tilt your head back and press into the soft object. Keep your jaw and forehead relaxed. 4. Hold for __________ seconds. 5. Release the tension slowly. Relax your neck muscles completely before you repeat this exercise. Repeat __________ times. Complete this exercise __________ times a day. Posture and body mechanics Body mechanics refers to the movements and positions of your body while you do your daily activities. Posture is part of body mechanics. Good posture and healthy body mechanics can help to relieve stress in your body's tissues and joints. Good posture means that your spine is in its natural S-curve position (your spine is neutral), your shoulders are pulled back slightly, and your head is not tipped forward. The following are general guidelines for applying improved   posture and body mechanics to your everyday activities. Sitting  1. When sitting, keep your spine neutral and keep your feet flat on the floor. Use a footrest, if necessary, and keep your thighs parallel to the floor. Avoid rounding your shoulders, and avoid tilting your head forward. 2. When working at a desk or a computer, keep your desk at a height where your hands are slightly lower than your elbows. Slide your  chair under your desk so you are close enough to maintain good posture. 3. When working at a computer, place your monitor at a height where you are looking straight ahead and you do not have to tilt your head forward or downward to look at the screen. Standing   When standing, keep your spine neutral and keep your feet about hip-width apart. Keep a slight bend in your knees. Your ears, shoulders, and hips should line up.  When you do a task in which you stand in one place for a long time, place one foot up on a stable object that is 2-4 inches (5-10 cm) high, such as a footstool. This helps keep your spine neutral. Resting When lying down and resting, avoid positions that are most painful for you. Try to support your neck in a neutral position. You can use a contour pillow or a small rolled-up towel. Your pillow should support your neck but not push on it. This information is not intended to replace advice given to you by your health care provider. Make sure you discuss any questions you have with your health care provider. Document Revised: 05/04/2018 Document Reviewed: 10/13/2017 [employer] Patient Education  [place].  Elbow and Forearm Exercises Ask your health care provider which exercises are safe for you. Do exercises exactly as told by your health care provider and adjust them as directed. It is normal to feel mild stretching, pulling, tightness, or discomfort as you do these exercises. Stop right away if you feel sudden pain or your pain gets worse. Do not begin these exercises until told by your health care provider. Range-of-motion exercises These exercises warm up your muscles and joints and improve the movement and flexibility of your injured elbow and forearm. The exercises also help to relieve pain, numbness, and tingling. These exercises are done using the muscles in your injured elbow and forearm (active). Elbow flexion, active Hold your left / right arm at your side, and  bend your elbow (flexion) as far as you can using only your arm muscles. Hold this position for __________ seconds. Slowly return to the starting position. Repeat __________ times. Complete this exercise __________ times a day. Elbow extension, active Hold your left / right arm at your side, and straighten your elbow (extension) as much as you can using only your arm muscles. Hold this position for __________ seconds. Slowly return to the starting position. Repeat __________ times. Complete this exercise __________ times a day. Active forearm rotation, supination This is an exercise in which you turn (rotate) your forearm palm up (supination). Stand or sit with your elbows at your sides. Bend your left / right elbow to a 90-degree angle (right angle). Rotate your palm up until you feel a gentle stretch on the inside of your forearm. Hold this position for __________ seconds. Slowly return to the starting position. Repeat __________ times. Complete this exercise __________ times a day. Active forearm rotation, pronation This is an exercise in which you turn (rotate) your forearm palm down (pronation). Stand or sit with your  elbows at your sides. Bend your left / right elbow to a 90-degree angle (right angle). Rotate your palm down until you feel a gentle stretch on the top of your forearm. Hold this position for __________ seconds. Slowly return to the starting position. Repeat __________ times. Complete this exercise __________ times a day. Stretching exercises These exercises warm up your muscles and joints and improve the movement and flexibility of your injured elbow and forearm. These exercises also help to relieve pain, numbness, and tingling. These exercises are done using your healthy elbow and forearm to help stretch the muscles in your injured elbow and forearm (active-assisted). Elbow flexion, active-assisted  Hold your left / right arm at your side, and bend your elbow  (flexion) as much as you can using your left / right arm muscles. Use your other hand to bend your left / right elbow farther. To do this, gently push up on your forearm until you feel a gentle stretch on the back of your elbow. Hold this position for __________ seconds. Slowly return to the starting position. Repeat __________ times. Complete this exercise __________ times a day. Elbow extension, active-assisted  Hold your left / right arm at your side, and straighten your elbow (extension) as much as you can using your left / right arm muscles. Use your other hand to straighten the left / right elbow farther. To do this, gently push down on your forearm until you feel a gentle stretch on the inside of your elbow. Hold this position for __________ seconds. Slowly return to the starting position. Repeat __________ times. Complete this exercise __________ times a day. Active-assisted forearm rotation, supination This is an exercise in which you turn (rotate) your forearm palm up (supination). Sit with your left / right elbow bent in a 90-degree angle (right angle) with your forearm resting on a table. Keeping your upper body and shoulder still, rotate your forearm so your palm faces upward. Use your other hand to help rotate your forearm further until you feel a gentle to moderate stretch. Hold this position for __________ seconds. Slowly release the stretch and return to the starting position. Repeat __________ times. Complete this exercise __________ times a day. Active-assisted forearm rotation, pronation This is an exercise in which you turn (rotate) your forearm palm down (pronation). Sit with your left / right elbow bent in a 90-degree angle (right angle) with your forearm resting on a table. Keeping your upper body and shoulder still, rotate your forearm so your palm faces the tabletop. Use your other hand to help rotate your forearm further until you feel a gentle to moderate  stretch. Hold this position for __________ seconds. Slowly release the stretch and return to the starting position. Repeat __________ times. Complete this exercise __________ times a day. Passive elbow flexion, supine Lie on your back (supine position). Extend your left / right arm up in the air, bracing it with your other hand. Let your left / right hand slowly lower toward your shoulder (passive flexion), while your elbow stays pointed toward the ceiling. You should feel a gentle stretch along the back of your upper arm and elbow. If instructed by your health care provider, you may increase the intensity of your stretch by adding a small wrist weight or hand weight. Hold this position for __________ seconds. Slowly return to the starting position. Repeat __________ times. Complete this exercise __________ times a day. Passive elbow extension, supine  Lie on your back (supine position). Make sure that you  are in a comfortable position that lets you relax your arm muscles. Place a folded towel under your left / right upper arm so your elbow and shoulder are at the same height. Straighten your left / right arm so your elbow does not rest on the bed or towel. Let the weight of your hand stretch your elbow (passive extension). Keep your arm and chest muscles relaxed. You should feel a stretch on the inside of your elbow. If told by your health care provider, you may increase the intensity of your stretch by adding a small wrist weight or hand weight. Hold this position for __________ seconds. Slowly release the stretch. Repeat __________ times. Complete this exercise __________ times a day. Strengthening exercises These exercises build strength and endurance in your elbow and forearm. Endurance is the ability to use your muscles for a long time, even after they get tired. Elbow flexion, isometric  Stand or sit up straight. Bend your left / right elbow in a 90-degree angle (right angle), and  keep your forearm at the height of your waist. Your thumb should be pointed toward the ceiling (neutral forearm). Place your other hand on top of your left / right forearm. Gently push down while you resist with your left / right arm (isometric flexion). Push as hard as you can with both arms without causing any pain or movement at your left / right elbow. Hold this position for __________ seconds. Slowly release the tension in both arms. Let your muscles relax completely before you repeat the exercise. Repeat __________ times. Complete this exercise __________ times a day. Elbow extension, isometric  Stand or sit up straight. Place your left / right arm so your palm faces your abdomen and is at the height of your waist. Place your other hand on the underside of your left / right forearm. Gently push up while you resist with your left / right arm (isometric extension). Push as hard as you can with both arms without causing any pain or movement at your left / right elbow. Hold this position for __________ seconds. Slowly release the tension in both arms. Let your muscles relax completely before you repeat the exercise. Repeat __________ times. Complete this exercise __________ times a day. Elbow flexion with forearm palm up  Sit on a firm chair without armrests, or stand up. Place your left / right arm at your side with your elbow straight and your palm facing forward. Holding a __________weight or gripping a rubber exercise band or tubing, bend your elbow to bring your hand toward your shoulder (flexion). Hold this position for __________ seconds. Slowly return to the starting position. Repeat __________ times. Complete this exercise __________ times a day. Elbow extension, active  Sit on a firm chair without armrests, or stand up. Hold a rubber exercise band or tubing in both hands. Keeping your upper arms at your sides, bring both hands up to your left / right shoulder. Keep your left /  right hand just below your other hand. Straighten your left / right elbow (extension) while keeping your other arm still. Hold this position for __________ seconds. Control the resistance of the band or tubing as you return to the starting position. Repeat __________ times. Complete this exercise __________ times a day. Forearm rotation, supination  Sit with your left / right forearm supported on a table. Your elbow should be at waist height and bent at a 90-degree angle (right angle). Gently grasp a lightweight hammer. Rest your hand over  the edge of the table with your palm facing down. Without moving your left / right elbow, slowly rotate your forearm to turn your palm up toward the ceiling (supination). Hold this position for __________ seconds. Slowly return to the starting position. Repeat __________ times. Complete this exercise __________ times a day. Forearm rotation, pronation  Sit with your left / right forearm supported on a table. Keep your elbow below shoulder height. Gently grasp a lightweight hammer. Rest your hand over the edge of the table with your palm facing up. Without moving your left / right elbow, slowly rotate your forearm to turn your palm down toward the floor (pronation). Hold this position for __________seconds. Slowly return to the starting position. Repeat __________ times. Complete this exercise __________ times a day. This information is not intended to replace advice given to you by your health care provider. Make sure you discuss any questions you have with your health care provider. Document Revised: 05/05/2018 Document Reviewed: 02/02/2018 [employer] Patient Education  Julia [place].

## 2019-08-02 LAB — URINALYSIS, ROUTINE W REFLEX MICROSCOPIC
Bilirubin Urine: NEGATIVE
Glucose, UA: NEGATIVE
Hgb urine dipstick: NEGATIVE
Ketones, ur: NEGATIVE
Leukocytes,Ua: NEGATIVE
Nitrite: NEGATIVE
Protein, ur: NEGATIVE
Specific Gravity, Urine: 1.009 (ref 1.001–1.03)
pH: <=5 (ref 5.0–8.0)

## 2019-08-02 LAB — COMPLETE METABOLIC PANEL WITH GFR
AG Ratio: 1.7 (calc) (ref 1.0–2.5)
ALT: 11 U/L (ref 6–29)
AST: 15 U/L (ref 10–35)
Albumin: 4 g/dL (ref 3.6–5.1)
Alkaline phosphatase (APISO): 51 U/L (ref 31–125)
BUN: 15 mg/dL (ref 7–25)
CO2: 27 mmol/L (ref 20–32)
Calcium: 9.1 mg/dL (ref 8.6–10.2)
Chloride: 105 mmol/L (ref 98–110)
Creat: 0.64 mg/dL (ref 0.50–1.10)
GFR, Est African American: 123 mL/min/{1.73_m2} (ref >=60)
GFR, Est Non African American: 106 mL/min/{1.73_m2} (ref >=60)
Globulin: 2.4 g/dL (calc) (ref 1.9–3.7)
Glucose, Bld: 87 mg/dL (ref 65–99)
Potassium: 4 mmol/L (ref 3.5–5.3)
Sodium: 139 mmol/L (ref 135–146)
Total Bilirubin: 0.4 mg/dL (ref 0.2–1.2)
Total Protein: 6.4 g/dL (ref 6.1–8.1)

## 2019-08-02 LAB — CBC WITH DIFFERENTIAL/PLATELET
Absolute Monocytes: 473 cells/uL (ref 200–950)
Basophils Absolute: 30 cells/uL (ref 0–200)
Basophils Relative: 0.4 %
Eosinophils Absolute: 113 cells/uL (ref 15–500)
Eosinophils Relative: 1.5 %
HCT: 37.7 % (ref 35.0–45.0)
Hemoglobin: 12.4 g/dL (ref 11.7–15.5)
Lymphs Abs: 2273 cells/uL (ref 850–3900)
MCH: 29 pg (ref 27.0–33.0)
MCHC: 32.9 g/dL (ref 32.0–36.0)
MCV: 88.1 fL (ref 80.0–100.0)
MPV: 10.3 fL (ref 7.5–12.5)
Monocytes Relative: 6.3 %
Neutro Abs: 4613 cells/uL (ref 1500–7800)
Neutrophils Relative %: 61.5 %
Platelets: 238 10*3/uL (ref 140–400)
RBC: 4.28 10*6/uL (ref 3.80–5.10)
RDW: 12.3 % (ref 11.0–15.0)
Total Lymphocyte: 30.3 %
WBC: 7.5 10*3/uL (ref 3.8–10.8)

## 2019-08-02 LAB — C3 AND C4
C3 Complement: 100 mg/dL (ref 83–193)
C4 Complement: 37 mg/dL (ref 15–57)

## 2019-08-02 LAB — ANTI-DNA ANTIBODY, DOUBLE-STRANDED: ds DNA Ab: 20 IU/mL — ABNORMAL HIGH

## 2019-08-02 LAB — SEDIMENTATION RATE: Sed Rate: 6 mm/h (ref 0–20)

## 2019-08-04 ENCOUNTER — Other Ambulatory Visit: Payer: Self-pay | Admitting: Family Medicine

## 2019-08-04 ENCOUNTER — Encounter: Payer: Self-pay | Admitting: Podiatry

## 2019-08-04 ENCOUNTER — Ambulatory Visit (INDEPENDENT_AMBULATORY_CARE_PROVIDER_SITE_OTHER): Payer: 59 | Admitting: Podiatry

## 2019-08-04 ENCOUNTER — Other Ambulatory Visit: Payer: Self-pay

## 2019-08-04 VITALS — Temp 97.5°F

## 2019-08-04 DIAGNOSIS — L600 Ingrowing nail: Secondary | ICD-10-CM

## 2019-08-04 NOTE — Progress Notes (Signed)
  Subjective:  Patient ID: Julia Monroe, female    DOB: 22-Oct-1970,  MRN: [id number]  Chief Complaint  Patient presents with  . Ingrown Toenail    Follow-up; Left foot; Hallux-medial side; pt stated, "Feels a little sore; still soaking toe in epsom salt-helping"    49 y.o. female presents with the above complaint. History confirmed with patient.  Healing well, no issues.  Objective:  Physical Exam: Nail avulsion site healing well.  No infection.  No pain  Assessment:   1. Ingrowing toenail without infection      Plan:  Patient was evaluated and treated and all questions answered.  Avulsion site has healed well.  Resume normal activities.  Call if concerns or questions or if any other nail border become symptomatic.  Lanae Crumbly, DPM 08/04/2019     Return if symptoms worsen or fail to improve.

## 2019-08-08 MED FILL — DICLOFENAC SOD EC 50 MG TAB: 50 | 20 days supply | Qty: 20 | Fill #0

## 2019-09-05 DIAGNOSIS — M1712 Unilateral primary osteoarthritis, left knee: Secondary | ICD-10-CM | POA: Diagnosis not present

## 2019-09-05 DIAGNOSIS — M25522 Pain in left elbow: Secondary | ICD-10-CM | POA: Diagnosis not present

## 2019-09-05 MED FILL — traMADol HCL 50 MG TABS: 50 | 30 days supply | Qty: 90 | Fill #0

## 2019-09-07 ENCOUNTER — Other Ambulatory Visit: Payer: Self-pay | Admitting: Rheumatology

## 2019-09-07 MED FILL — DICLOFENAC SOD EC 50 MG TAB: 50 | 20 days supply | Qty: 20 | Fill #0

## 2019-09-07 MED FILL — DICLOFENAC SODIUM 1 % GEL: 1 | 25 days supply | Qty: 400 | Fill #0

## 2019-10-04 MED FILL — traMADol HCL 50 MG TABS: 50 | 30 days supply | Qty: 90 | Fill #0

## 2019-10-10 ENCOUNTER — Other Ambulatory Visit: Payer: Self-pay | Admitting: Family Medicine

## 2019-10-10 DIAGNOSIS — R7303 Prediabetes: Secondary | ICD-10-CM | POA: Diagnosis not present

## 2019-10-10 DIAGNOSIS — Z6831 Body mass index (BMI) 31.0-31.9, adult: Secondary | ICD-10-CM | POA: Diagnosis not present

## 2019-10-12 LAB — LIPID PANEL W/O CHOL/HDL RATIO
Cholesterol, Total: 132 mg/dL (ref 100–199)
HDL: 57 mg/dL (ref >39)
LDL Chol Calc (NIH): 64 mg/dL (ref 0–99)
Triglycerides: 50 mg/dL (ref 0–149)
VLDL Cholesterol Cal: 11 mg/dL (ref 5–40)

## 2019-11-03 MED FILL — traMADol HCL 50 MG TABS: 50 | 30 days supply | Qty: 90 | Fill #1

## 2019-12-04 MED FILL — traMADol HCL 50 MG TABS: 50 | 30 days supply | Qty: 90 | Fill #2

## 2019-12-05 DIAGNOSIS — M1712 Unilateral primary osteoarthritis, left knee: Secondary | ICD-10-CM | POA: Diagnosis not present

## 2019-12-12 DIAGNOSIS — M1712 Unilateral primary osteoarthritis, left knee: Secondary | ICD-10-CM | POA: Diagnosis not present

## 2019-12-19 DIAGNOSIS — M1712 Unilateral primary osteoarthritis, left knee: Secondary | ICD-10-CM | POA: Diagnosis not present

## 2019-12-27 NOTE — Progress Notes (Signed)
Office Visit Note  Patient: Julia Monroe             Date of Birth: 1970-12-26           MRN: [id number]             PCP: Billie Ruddy, MD Referring: Billie Ruddy, MD Visit Date: 01/09/2020 Occupation: @GUAROCC @  Subjective:  Pain in left knee.   History of Present Illness: Julia Monroe is a 49 y.o. female for systemic lupus dermatosis and osteoarthritis.  She states she has been having lot of pain and discomfort in her left knee joint.  She recently was in physical supplement injections with Dr. Mayer Camel.  She will need left total knee replacement.  Her bilateral total hip replacements are doing well.  She denies any history of oral ulcers, nasal ulcers, malar rash, photosensitivity, sicca symptoms or lymphadenopathy.  She has not had any joint swelling.  She has been tolerating Plaquenil well.  She states she is tired because she worked last night.  Activities of Daily Living:  Patient reports morning stiffness for 5-30 minutes.   Patient Reports nocturnal pain.  Difficulty dressing/grooming: Denies Difficulty climbing stairs: Denies Difficulty getting out of chair: Denies Difficulty using hands for taps, buttons, cutlery, and/or writing: Denies  Review of Systems  Constitutional: Negative for fatigue.  HENT: Negative for mouth sores, mouth dryness and nose dryness.   Eyes: Negative for pain, itching, visual disturbance and dryness.  Respiratory: Negative for cough, hemoptysis, shortness of breath and difficulty breathing.   Cardiovascular: Negative for chest pain, palpitations and swelling in legs/feet.  Gastrointestinal: Negative for abdominal pain, blood in stool, constipation and diarrhea.  Endocrine: Negative for increased urination.  Genitourinary: Negative for painful urination.  Musculoskeletal: Positive for arthralgias, joint pain, myalgias, morning stiffness and myalgias. Negative for joint swelling, muscle weakness and muscle tenderness.  Skin:  Negative for color change, rash and redness.  Allergic/Immunologic: Negative for susceptible to infections.  Neurological: Negative for dizziness, numbness, headaches, memory loss and weakness.  Hematological: Negative for swollen glands.  Psychiatric/Behavioral: Negative for confusion and sleep disturbance.    PMFS History:  Patient Active Problem List   Diagnosis Date Noted  . History of total hip arthroplasty, right 11/21/2018  . Prediabetes 03/21/2018  . SLE (systemic lupus erythematosus) ([place]) 03/21/2018  . Obesity 03/21/2018  . History of migraine 08/27/2017  . Class 3 severe obesity due to excess calories without serious comorbidity with body mass index (BMI) of 50.0 to 59.9 in adult ([place]) 01/27/2017  . Status post bilateral total hip replacement 10/27/2016  . Hyperglycemia 08/25/2016  . Leukocytosis 08/13/2016  . Status post laparoscopic hysterectomy 07/06/2016  . Primary osteoarthritis of both knees 04/29/2016  . ANA positive 04/29/2016  . Primary osteoarthritis of right hip 07/10/2011    Past Medical History:  Diagnosis Date  . Arthritis    osteoarthritis  . Complication of anesthesia    bottom of feet itching  . Elevated hemoglobin A1c 2018   6.1  . Herpes simplex type II infection 2020  . Lupus ([place]) 2018   Dr.Adalaide Jaskolski, currently in normal range  . Migraines     Family History  Problem Relation Age of Onset  . Osteoarthritis Mother   . Osteoarthritis Brother   . Hypertension Brother   . Diabetes Brother   . Osteoarthritis Maternal Grandmother   . Arthritis Maternal Grandmother    Past Surgical History:  Procedure Laterality Date  . ABDOMINAL HYSTERECTOMY    .  CESAREAN SECTION  2000  . CYSTOSCOPY N/A 07/06/2016   Procedure: CYSTOSCOPY;  Surgeon: Nunzio Cobbs, MD;  Location: [facility];  Service: Gynecology;  Laterality: N/A;  . ingrown toenail Left 07/28/2019  . JOINT REPLACEMENT  L   Left hip replacement  . LAPAROSCOPIC GASTRIC SLEEVE  RESECTION N/A 03/21/2018   Procedure: LAPAROSCOPIC GASTRIC SLEEVE RESECTION, UPPER ENDO, ERAS Pathway;  Surgeon: [name], MD;  Location: [facility];  Service: General;  Laterality: N/A;  . OOPHORECTOMY  2007   Rt.oophorectomy   . OVARIAN CYST SURGERY  ? and 10   rt removed,cyst off lft  . TOTAL HIP ARTHROPLASTY  07/06/2011   Procedure: TOTAL HIP ARTHROPLASTY;  Surgeon: Kerin Salen, MD;  Location: [facility];  Service: Orthopedics;  Laterality: Left;  DEPUY/PINNACLE  . TOTAL HIP ARTHROPLASTY Right 11/21/2018   Procedure: RIGHT TOTAL HIP ARTHROPLASTY ANTERIOR APPROACH;  Surgeon: Frederik Pear, MD;  Location: [facility];  Service: Orthopedics;  Laterality: Right;  . TOTAL LAPAROSCOPIC HYSTERECTOMY WITH SALPINGECTOMY Left 07/06/2016   Procedure: HYSTERECTOMY TOTAL LAPAROSCOPIC WITH Left SALPINGECTOMY, Suture Left Side Wall Laceration;  Surgeon: Nunzio Cobbs, MD;  Location: [facility];  Service: Gynecology;  Laterality: Left;   Social History   Social History Narrative   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[employer]      Home Situation: 72 yo daughter in 2018      Spiritual Beliefs:      Lifestyle: starting healthy low sugar diet 08/2016/considering exercising at the [employer] History  Administered Date(s) Administered  . Influenza-Unspecified 10/26/2013, 10/26/2016  . PFIZER SARS-COV-2 Vaccination 04/28/2019, 05/23/2019  . Pneumococcal Conjugate-13 11/02/2016  . Tdap 01/26/2009, 12/20/2018     Objective: Vital Signs: BP 106/62 (BP Location: Right Arm, Patient Position: Sitting, Cuff Size: Small)   Pulse 68   Ht $R'5\' 9"'Zb$  (1.753 m)   Wt 224 lb 4.8 oz (101.7 kg)   LMP 04/29/2016   BMI 33.12 kg/m    Physical Exam Vitals and nursing note reviewed.  Constitutional:      Appearance: She is well-developed and well-nourished.  HENT:     Head: Normocephalic and atraumatic.  Eyes:     Extraocular Movements: EOM normal.     Conjunctiva/sclera: Conjunctivae normal.  Cardiovascular:     Rate  and Rhythm: Normal rate and regular rhythm.     Pulses: Intact distal pulses.     Heart sounds: Normal heart sounds.  Pulmonary:     Effort: Pulmonary effort is normal.     Breath sounds: Normal breath sounds.  Abdominal:     General: Bowel sounds are normal.     Palpations: Abdomen is soft.  Musculoskeletal:     Cervical back: Normal range of motion.  Lymphadenopathy:     Cervical: No cervical adenopathy.  Skin:    General: Skin is warm and dry.     Capillary Refill: Capillary refill takes less than 2 seconds.  Neurological:     Mental Status: She is alert and oriented to person, place, and time.  Psychiatric:        Mood and Affect: Mood and affect normal.        Behavior: Behavior normal.      Musculoskeletal Exam: She has some discomfort walking due to left knee joint pain.  Shoulder joints, elbow joints, wrist joints, MCPs PIPs and DIPs with good range of motion with no synovitis.  She had good range of motion of her hip joints.  She has been for range of motion of her left knee joint without any warmth swelling or effusion.  Right knee joint was in good range of motion.  She has no tenderness over ankle joints or MTPs.  CDAI Exam: CDAI Score: -- Patient Global: --; Provider Global: -- Swollen: --; Tender: -- Joint Exam 01/09/2020   No joint exam has been documented for this visit   There is currently no information documented on the homunculus. Go to the Rheumatology activity and complete the homunculus joint exam.  Investigation: No additional findings.  Imaging: No results found.  Recent Labs: Lab Results  Component Value Date   WBC 7.4 01/01/2020   HGB 13.0 01/01/2020   PLT 222 01/01/2020   NA 139 01/01/2020   K 4.0 01/01/2020   CL 104 01/01/2020   CO2 22 01/01/2020   GLUCOSE 76 01/01/2020   BUN 15 01/01/2020   CREATININE 0.53 01/01/2020   BILITOT 0.4 01/01/2020   ALKPHOS 49 05/11/2018   AST 20 01/01/2020   ALT 13 01/01/2020   PROT 6.5 01/01/2020    ALBUMIN 4.1 05/11/2018   CALCIUM 8.6 01/01/2020   GFRAA 131 01/01/2020    Speciality Comments: PLQ eye exam: 05/08/2019 Normal. [employer] [facility]. Follow up in 1 year.  Procedures:  No procedures performed Allergies: Monosodium glutamate, Hydromorphone, Lactose intolerance (gi), Caffeine, Hydrocodone, and Oxycodone   Assessment / Plan:     Visit Diagnoses: Other systemic lupus erythematosus with other organ involvement ([place]) - History of positive ANA, DS DNA, elevated ESR, arthralgias, rash -she is doing much better since she has been on Plaquenil.  She denies any joint swelling or rash.  She continues to have some left knee joint discomfort due to osteoarthritis.  Her labs are stable.  Labs were reviewed today.  We will check labs again in 5 months.  Plan: Urinalysis, Routine w reflex microscopic, Anti-DNA antibody, double-stranded, C3 and C4, Sedimentation rate, CANCELED: Urinalysis, Routine w reflex microscopic  High risk medication use - PLQ 200 mg po bid. PLQ eye exam: 05/08/2019 - Plan: CBC with Differential/Platelet, COMPLETE METABOLIC PANEL WITH GFR, CANCELED: CBC with Differential/Platelet  Trapezius muscle spasm-improved.  Lateral epicondylitis, left elbow-she is off-and-on discomfort.  Use of tennis elbow brace was discussed.  Primary osteoarthritis of both knees-she has severe arthritis in her left knee joint.  She has had Visco supplement injections.  She plans to get left total knee replacement in the future.  Status post bilateral total hip replacement - Right total hip replacement October 2020 by Dr. Mayer Camel, [facility] 2013 by Dr. Mayer Camel  Vitamin D deficiency-vitamin D is normal now.  Other fatigue-improved.  History of migraine  Orders: Orders Placed This Encounter  Procedures  . CBC with Differential/Platelet  . COMPLETE METABOLIC PANEL WITH GFR  . Urinalysis, Routine w reflex microscopic  . Anti-DNA antibody, double-stranded  . C3 and C4  . Sedimentation rate    No orders of the defined types were placed in this encounter.     Follow-Up Instructions: Return in about 5 months (around 06/08/2020) for Systemic lupus.   Bo Merino, MD  Note - This record has been created using Editor, commissioning.  Chart creation errors have been sought, but may not always  have been located. Such creation errors do not reflect on  the standard of medical care.

## 2020-01-01 ENCOUNTER — Other Ambulatory Visit: Payer: Self-pay

## 2020-01-01 DIAGNOSIS — Z79899 Other long term (current) drug therapy: Secondary | ICD-10-CM | POA: Diagnosis not present

## 2020-01-01 DIAGNOSIS — M3219 Other organ or system involvement in systemic lupus erythematosus: Secondary | ICD-10-CM

## 2020-01-01 NOTE — Telephone Encounter (Signed)
Last Visit: 08/01/2019 Next Visit: 01/09/2020 Labs: 08/01/2019 CBC and CMP WNL. Eye exam: 05/08/2019 Normal.  Current Dose per office note 08/01/2019: PLQ 200 mg po bid DX: Other systemic lupus erythematosus with other organ involvement   Patient updated labs today.   Okay to refill Plaquenil?

## 2020-01-01 NOTE — Telephone Encounter (Signed)
Patient requested prescription refill of Hydroxychloroquine to be sent to [facility].  Patient states she only has 2 tablets remaining.

## 2020-01-02 ENCOUNTER — Other Ambulatory Visit: Payer: Self-pay | Admitting: Physician Assistant

## 2020-01-02 DIAGNOSIS — Z96642 Presence of left artificial hip joint: Secondary | ICD-10-CM | POA: Diagnosis not present

## 2020-01-02 DIAGNOSIS — Z96641 Presence of right artificial hip joint: Secondary | ICD-10-CM | POA: Diagnosis not present

## 2020-01-02 MED ORDER — HYDROXYCHLOROQUINE SULFATE 200 MG PO TABS: 200.0000 mg | ORAL_TABLET | Freq: Two times a day (BID) | ORAL | 0 refills | Status: DC

## 2020-01-02 MED FILL — HYDROXYCHLOROQUINE 200 MG T: 200 | 90 days supply | Qty: 180 | Fill #0

## 2020-01-02 MED FILL — traMADol HCL 50 MG TABS: 50 | 30 days supply | Qty: 90 | Fill #0

## 2020-01-02 NOTE — Progress Notes (Signed)
All the autoimmune labs are negative so far.  I will discuss results at the follow-up visit.

## 2020-01-02 NOTE — Telephone Encounter (Signed)
CBC and CMP WNL on 01/02/20.  Ok to refill PLQ.

## 2020-01-04 LAB — COMPLETE METABOLIC PANEL WITH GFR
AG Ratio: 1.6 (calc) (ref 1.0–2.5)
ALT: 13 U/L (ref 6–29)
AST: 20 U/L (ref 10–35)
Albumin: 4 g/dL (ref 3.6–5.1)
Alkaline phosphatase (APISO): 46 U/L (ref 31–125)
BUN: 15 mg/dL (ref 7–25)
CO2: 22 mmol/L (ref 20–32)
Calcium: 8.6 mg/dL (ref 8.6–10.2)
Chloride: 104 mmol/L (ref 98–110)
Creat: 0.53 mg/dL (ref 0.50–1.10)
GFR, Est African American: 131 mL/min/{1.73_m2} (ref >=60)
GFR, Est Non African American: 113 mL/min/{1.73_m2} (ref >=60)
Globulin: 2.5 g/dL (calc) (ref 1.9–3.7)
Glucose, Bld: 76 mg/dL (ref 65–99)
Potassium: 4 mmol/L (ref 3.5–5.3)
Sodium: 139 mmol/L (ref 135–146)
Total Bilirubin: 0.4 mg/dL (ref 0.2–1.2)
Total Protein: 6.5 g/dL (ref 6.1–8.1)

## 2020-01-04 LAB — CBC WITH DIFFERENTIAL/PLATELET
Absolute Monocytes: 622 cells/uL (ref 200–950)
Basophils Absolute: 22 cells/uL (ref 0–200)
Basophils Relative: 0.3 %
Eosinophils Absolute: 59 cells/uL (ref 15–500)
Eosinophils Relative: 0.8 %
HCT: 39.7 % (ref 35.0–45.0)
Hemoglobin: 13 g/dL (ref 11.7–15.5)
Lymphs Abs: 2065 cells/uL (ref 850–3900)
MCH: 29.3 pg (ref 27.0–33.0)
MCHC: 32.7 g/dL (ref 32.0–36.0)
MCV: 89.6 fL (ref 80.0–100.0)
MPV: 10.5 fL (ref 7.5–12.5)
Monocytes Relative: 8.4 %
Neutro Abs: 4632 cells/uL (ref 1500–7800)
Neutrophils Relative %: 62.6 %
Platelets: 222 10*3/uL (ref 140–400)
RBC: 4.43 10*6/uL (ref 3.80–5.10)
RDW: 11.6 % (ref 11.0–15.0)
Total Lymphocyte: 27.9 %
WBC: 7.4 10*3/uL (ref 3.8–10.8)

## 2020-01-04 LAB — URINALYSIS, ROUTINE W REFLEX MICROSCOPIC
Bilirubin Urine: NEGATIVE
Glucose, UA: NEGATIVE
Hgb urine dipstick: NEGATIVE
Ketones, ur: NEGATIVE
Leukocytes,Ua: NEGATIVE
Nitrite: NEGATIVE
Protein, ur: NEGATIVE
Specific Gravity, Urine: 1.028 (ref 1.001–1.03)
pH: 5.5 (ref 5.0–8.0)

## 2020-01-04 LAB — ANTI-DNA ANTIBODY, DOUBLE-STRANDED: ds DNA Ab: 18 IU/mL — ABNORMAL HIGH

## 2020-01-04 LAB — SEDIMENTATION RATE: Sed Rate: 2 mm/h (ref 0–20)

## 2020-01-04 LAB — VITAMIN D 25 HYDROXY (VIT D DEFICIENCY, FRACTURES): Vit D, 25-Hydroxy: 60 ng/mL (ref 30–100)

## 2020-01-04 LAB — C3 AND C4
C3 Complement: 101 mg/dL (ref 83–193)
C4 Complement: 36 mg/dL (ref 15–57)

## 2020-01-04 LAB — ANA: Anti Nuclear Antibody (ANA): NEGATIVE

## 2020-01-09 ENCOUNTER — Ambulatory Visit: Payer: 59 | Admitting: Rheumatology

## 2020-01-09 ENCOUNTER — Other Ambulatory Visit: Payer: Self-pay

## 2020-01-09 ENCOUNTER — Encounter: Payer: Self-pay | Admitting: Rheumatology

## 2020-01-09 VITALS — BP 106/62 | HR 68 | Ht 69.0 in | Wt 224.3 lb

## 2020-01-09 DIAGNOSIS — Z96643 Presence of artificial hip joint, bilateral: Secondary | ICD-10-CM | POA: Diagnosis not present

## 2020-01-09 DIAGNOSIS — M3219 Other organ or system involvement in systemic lupus erythematosus: Secondary | ICD-10-CM

## 2020-01-09 DIAGNOSIS — M7712 Lateral epicondylitis, left elbow: Secondary | ICD-10-CM | POA: Diagnosis not present

## 2020-01-09 DIAGNOSIS — Z79899 Other long term (current) drug therapy: Secondary | ICD-10-CM | POA: Diagnosis not present

## 2020-01-09 DIAGNOSIS — M62838 Other muscle spasm: Secondary | ICD-10-CM | POA: Diagnosis not present

## 2020-01-09 DIAGNOSIS — M170 Bilateral primary osteoarthritis of knee: Secondary | ICD-10-CM

## 2020-01-09 DIAGNOSIS — E559 Vitamin D deficiency, unspecified: Secondary | ICD-10-CM

## 2020-01-09 DIAGNOSIS — Z8669 Personal history of other diseases of the nervous system and sense organs: Secondary | ICD-10-CM | POA: Diagnosis not present

## 2020-01-09 DIAGNOSIS — R5383 Other fatigue: Secondary | ICD-10-CM | POA: Diagnosis not present

## 2020-01-09 NOTE — Patient Instructions (Signed)

## 2020-01-12 ENCOUNTER — Other Ambulatory Visit: Payer: Self-pay

## 2020-01-12 ENCOUNTER — Inpatient Hospital Stay: Payer: 59 | Attending: Oncology

## 2020-01-12 DIAGNOSIS — Z23 Encounter for immunization: Secondary | ICD-10-CM | POA: Insufficient documentation

## 2020-02-07 MED FILL — traMADol HCL 50 MG TABS: 50 | 30 days supply | Qty: 90 | Fill #1

## 2020-03-06 MED FILL — traMADol HCL 50 MG TABS: 50 | 30 days supply | Qty: 90 | Fill #2

## 2020-03-18 ENCOUNTER — Ambulatory Visit
Admission: RE | Admit: 2020-03-18 | Discharge: 2020-03-18 | Disposition: A | Discharge status: Home / Self Care | Payer: 59 | Source: Ambulatory Visit | Attending: Obstetrics and Gynecology | Admitting: Obstetrics and Gynecology

## 2020-03-18 ENCOUNTER — Other Ambulatory Visit: Payer: Self-pay | Admitting: Obstetrics and Gynecology

## 2020-03-18 ENCOUNTER — Other Ambulatory Visit: Payer: Self-pay

## 2020-03-18 DIAGNOSIS — Z1231 Encounter for screening mammogram for malignant neoplasm of breast: Secondary | ICD-10-CM

## 2020-04-04 ENCOUNTER — Other Ambulatory Visit (HOSPITAL_COMMUNITY): Payer: Self-pay | Admitting: Orthopedic Surgery

## 2020-04-04 DIAGNOSIS — M1712 Unilateral primary osteoarthritis, left knee: Secondary | ICD-10-CM | POA: Diagnosis not present

## 2020-04-04 MED FILL — traMADol HCL 50 MG TABS: 50 | 30 days supply | Qty: 90 | Fill #0

## 2020-04-11 ENCOUNTER — Telehealth: Payer: Self-pay | Admitting: Rheumatology

## 2020-04-11 NOTE — Telephone Encounter (Signed)
#  1.Patient requesting a refill on Plaquenil sent to [facility].  #2. Also, patient will be the only person working at the [employer] location the in June.  Patient cannot get off work for rov, and would like to know if she could due a virtual visit for her next return. Please advise.

## 2020-04-11 NOTE — Telephone Encounter (Signed)
Last Visit: 01/09/2020 Next Visit: 06/11/2020 Labs: 01/01/2020, All the autoimmune labs are negative so far. I will discuss results at the follow-up visit. Eye exam: 05/08/2019  Current Dose per office note 01/09/2020, PLQ 200 mg po bid XA:JOINO systemic lupus erythematosus with other organ involvement   Last Fill: 01/02/2020  Okay to refill Plaquenil?

## 2020-04-11 NOTE — Telephone Encounter (Signed)
Patient cannot get off work for rov, and would like to know if she could due a virtual visit for her next return. Please advise.

## 2020-04-12 ENCOUNTER — Other Ambulatory Visit: Payer: Self-pay | Admitting: Rheumatology

## 2020-04-12 ENCOUNTER — Other Ambulatory Visit: Payer: Self-pay | Admitting: Physician Assistant

## 2020-04-12 DIAGNOSIS — M3219 Other organ or system involvement in systemic lupus erythematosus: Secondary | ICD-10-CM

## 2020-04-12 NOTE — Telephone Encounter (Signed)
Please call patient to schedule virtual visit per Dr. Estanislado Pandy. Thank you.

## 2020-04-12 NOTE — Telephone Encounter (Signed)
Ok to schedule a virtual visit.

## 2020-04-12 NOTE — Telephone Encounter (Signed)
Last Visit: 01/09/2020,  Next Visit: 06/11/2020 Labs: 01/01/2020, All the autoimmune labs are negative so far. I will discuss results at the follow-up visit. Eye exam: 05/08/2019  Current Dose per office note 01/09/2020, PLQ 200 mg po bid VQ:WQVLD systemic lupus erythematosus with other organ involvemen  Last Fill: 01/02/2020  Okay to refill Plaquenil?

## 2020-04-17 NOTE — Telephone Encounter (Signed)
A message was left for patient to call to reschedule her follow up to a virtual appointment on Monday 04/15/2020.

## 2020-05-07 ENCOUNTER — Other Ambulatory Visit (HOSPITAL_BASED_OUTPATIENT_CLINIC_OR_DEPARTMENT_OTHER): Payer: Self-pay

## 2020-05-07 MED FILL — Tramadol HCl Tab 50 MG: ORAL | 30 days supply | Qty: 90 | Fill #0 | Status: AC

## 2020-05-10 ENCOUNTER — Other Ambulatory Visit (HOSPITAL_BASED_OUTPATIENT_CLINIC_OR_DEPARTMENT_OTHER): Payer: Self-pay

## 2020-06-07 NOTE — Progress Notes (Deleted)
Virtual Visit via Video Note  I connected with Leslye Peer on 06/07/20 at  4:00 PM EDT by a video enabled telemedicine application and verified that I am speaking with the correct person using two identifiers.  Location: Patient: *** Provider: ***   I discussed the limitations of evaluation and management by telemedicine and the availability of in person appointments. The patient expressed understanding and agreed to proceed.  History of Present Illness:    Observations/Objective:   Assessment and Plan:   Follow Up Instructions:    I discussed the assessment and treatment plan with the patient. The patient was provided an opportunity to ask questions and all were answered. The patient agreed with the plan and demonstrated an understanding of the instructions.   The patient was advised to call back or seek an in-person evaluation if the symptoms worsen or if the condition fails to improve as anticipated.  I provided *** minutes of non-face-to-face time during this encounter.   Ofilia Neas, PA-C

## 2020-06-08 ENCOUNTER — Other Ambulatory Visit (HOSPITAL_COMMUNITY): Payer: Self-pay

## 2020-06-11 ENCOUNTER — Other Ambulatory Visit (HOSPITAL_BASED_OUTPATIENT_CLINIC_OR_DEPARTMENT_OTHER): Payer: Self-pay

## 2020-06-11 ENCOUNTER — Ambulatory Visit: Payer: 59 | Admitting: Physician Assistant

## 2020-06-11 MED FILL — Tramadol HCl Tab 50 MG: ORAL | 30 days supply | Qty: 90 | Fill #1 | Status: AC

## 2020-06-20 ENCOUNTER — Telehealth: Payer: 59 | Admitting: Rheumatology

## 2020-07-04 ENCOUNTER — Other Ambulatory Visit (HOSPITAL_BASED_OUTPATIENT_CLINIC_OR_DEPARTMENT_OTHER): Payer: Self-pay

## 2020-07-04 DIAGNOSIS — M7712 Lateral epicondylitis, left elbow: Secondary | ICD-10-CM | POA: Diagnosis not present

## 2020-07-04 DIAGNOSIS — M1712 Unilateral primary osteoarthritis, left knee: Secondary | ICD-10-CM | POA: Diagnosis not present

## 2020-07-04 MED ORDER — TRAMADOL HCL 50 MG PO TABS
ORAL_TABLET | ORAL | 2 refills | Status: DC
  Filled 2020-07-11: qty 90, 30d supply, fill #0
  Filled 2020-08-12: qty 90, 30d supply, fill #1
  Filled 2020-09-13: qty 90, 30d supply, fill #2

## 2020-07-05 ENCOUNTER — Other Ambulatory Visit: Payer: Self-pay | Admitting: *Deleted

## 2020-07-05 DIAGNOSIS — M3219 Other organ or system involvement in systemic lupus erythematosus: Secondary | ICD-10-CM

## 2020-07-05 DIAGNOSIS — Z79899 Other long term (current) drug therapy: Secondary | ICD-10-CM | POA: Diagnosis not present

## 2020-07-08 ENCOUNTER — Other Ambulatory Visit (HOSPITAL_BASED_OUTPATIENT_CLINIC_OR_DEPARTMENT_OTHER): Payer: Self-pay

## 2020-07-08 ENCOUNTER — Other Ambulatory Visit: Payer: Self-pay | Admitting: *Deleted

## 2020-07-08 DIAGNOSIS — M3219 Other organ or system involvement in systemic lupus erythematosus: Secondary | ICD-10-CM

## 2020-07-08 LAB — URINALYSIS, ROUTINE W REFLEX MICROSCOPIC
Bilirubin Urine: NEGATIVE
Glucose, UA: NEGATIVE
Hgb urine dipstick: NEGATIVE
Ketones, ur: NEGATIVE
Leukocytes,Ua: NEGATIVE
Nitrite: NEGATIVE
Protein, ur: NEGATIVE
Specific Gravity, Urine: 1.033 (ref 1.001–1.035)
pH: 5.5 (ref 5.0–8.0)

## 2020-07-08 LAB — COMPLETE METABOLIC PANEL WITH GFR
AG Ratio: 1.5 (calc) (ref 1.0–2.5)
ALT: 13 U/L (ref 6–29)
AST: 15 U/L (ref 10–35)
Albumin: 4.6 g/dL (ref 3.6–5.1)
Alkaline phosphatase (APISO): 64 U/L (ref 31–125)
BUN: 18 mg/dL (ref 7–25)
CO2: 23 mmol/L (ref 20–32)
Calcium: 10 mg/dL (ref 8.6–10.2)
Chloride: 105 mmol/L (ref 98–110)
Creat: 0.8 mg/dL (ref 0.50–1.10)
GFR, Est African American: 101 mL/min/{1.73_m2} (ref >=60)
GFR, Est Non African American: 87 mL/min/{1.73_m2} (ref >=60)
Globulin: 3.1 g/dL (calc) (ref 1.9–3.7)
Glucose, Bld: 151 mg/dL — ABNORMAL HIGH (ref 65–99)
Potassium: 3.7 mmol/L (ref 3.5–5.3)
Sodium: 140 mmol/L (ref 135–146)
Total Bilirubin: 0.3 mg/dL (ref 0.2–1.2)
Total Protein: 7.7 g/dL (ref 6.1–8.1)

## 2020-07-08 LAB — CBC WITH DIFFERENTIAL/PLATELET
Absolute Monocytes: 983 cells/uL — ABNORMAL HIGH (ref 200–950)
Basophils Absolute: 15 cells/uL (ref 0–200)
Basophils Relative: 0.1 %
Eosinophils Absolute: 0 cells/uL — ABNORMAL LOW (ref 15–500)
Eosinophils Relative: 0 %
HCT: 42.7 % (ref 35.0–45.0)
Hemoglobin: 13.8 g/dL (ref 11.7–15.5)
Lymphs Abs: 1132 cells/uL (ref 850–3900)
MCH: 29 pg (ref 27.0–33.0)
MCHC: 32.3 g/dL (ref 32.0–36.0)
MCV: 89.7 fL (ref 80.0–100.0)
MPV: 10.2 fL (ref 7.5–12.5)
Monocytes Relative: 6.6 %
Neutro Abs: 12769 cells/uL — ABNORMAL HIGH (ref 1500–7800)
Neutrophils Relative %: 85.7 %
Platelets: 236 10*3/uL (ref 140–400)
RBC: 4.76 10*6/uL (ref 3.80–5.10)
RDW: 12 % (ref 11.0–15.0)
Total Lymphocyte: 7.6 %
WBC: 14.9 10*3/uL — ABNORMAL HIGH (ref 3.8–10.8)

## 2020-07-08 LAB — C3 AND C4
C3 Complement: 130 mg/dL (ref 83–193)
C4 Complement: 45 mg/dL (ref 15–57)

## 2020-07-08 LAB — SEDIMENTATION RATE: Sed Rate: 9 mm/h (ref 0–20)

## 2020-07-08 LAB — ANTI-DNA ANTIBODY, DOUBLE-STRANDED: ds DNA Ab: 17 IU/mL — ABNORMAL HIGH

## 2020-07-08 MED ORDER — HYDROXYCHLOROQUINE SULFATE 200 MG PO TABS
ORAL_TABLET | Freq: Two times a day (BID) | ORAL | 0 refills | Status: DC
  Filled 2020-07-08: qty 180, 90d supply, fill #0

## 2020-07-08 NOTE — Progress Notes (Signed)
Sed rate and complements are normal.  UA is negative.  Double-stranded DNA is elevated and stable.  Glucose is elevated.  White cell count is elevated.  We will continue to monitor labs.  Labs do not indicate lupus flare.

## 2020-07-08 NOTE — Telephone Encounter (Signed)
Last Visit: 01/09/2020 Next Visit: 07/25/2020 Labs: 07/05/2020, Sed rate and complements are normal.  UA is negative.  Double-stranded DNA is elevated and stable.  Glucose is elevated.  White cell count is elevated.  We will continue to monitor labs.  Labs do not indicate lupus flare.  Eye exam: 05/08/2019, I called patient to advise PLQ eye exam due  Current Dose per office note 01/09/2020: PLQ 200 mg po bid.  EY:EMVVK systemic lupus erythematosus with other organ involvement  Last Fill: 04/12/2020  Okay to refill Plaquenil?

## 2020-07-11 ENCOUNTER — Other Ambulatory Visit (HOSPITAL_BASED_OUTPATIENT_CLINIC_OR_DEPARTMENT_OTHER): Payer: Self-pay

## 2020-07-25 ENCOUNTER — Encounter: Payer: Self-pay | Admitting: Rheumatology

## 2020-07-25 ENCOUNTER — Telehealth (INDEPENDENT_AMBULATORY_CARE_PROVIDER_SITE_OTHER): Payer: 59 | Admitting: Rheumatology

## 2020-07-25 ENCOUNTER — Other Ambulatory Visit: Payer: Self-pay

## 2020-07-25 ENCOUNTER — Other Ambulatory Visit (HOSPITAL_BASED_OUTPATIENT_CLINIC_OR_DEPARTMENT_OTHER): Payer: Self-pay

## 2020-07-25 VITALS — Ht 69.5 in

## 2020-07-25 DIAGNOSIS — M7712 Lateral epicondylitis, left elbow: Secondary | ICD-10-CM

## 2020-07-25 DIAGNOSIS — R5383 Other fatigue: Secondary | ICD-10-CM

## 2020-07-25 DIAGNOSIS — Z79899 Other long term (current) drug therapy: Secondary | ICD-10-CM | POA: Diagnosis not present

## 2020-07-25 DIAGNOSIS — Z96643 Presence of artificial hip joint, bilateral: Secondary | ICD-10-CM

## 2020-07-25 DIAGNOSIS — M3219 Other organ or system involvement in systemic lupus erythematosus: Secondary | ICD-10-CM

## 2020-07-25 DIAGNOSIS — Z8669 Personal history of other diseases of the nervous system and sense organs: Secondary | ICD-10-CM | POA: Diagnosis not present

## 2020-07-25 DIAGNOSIS — E559 Vitamin D deficiency, unspecified: Secondary | ICD-10-CM

## 2020-07-25 DIAGNOSIS — M170 Bilateral primary osteoarthritis of knee: Secondary | ICD-10-CM | POA: Diagnosis not present

## 2020-07-25 MED ORDER — DICLOFENAC SODIUM 1 % EX GEL
CUTANEOUS | 2 refills | Status: DC
  Filled 2020-07-25: qty 400, 25d supply, fill #0

## 2020-07-25 NOTE — Progress Notes (Signed)
Virtual Visit via Video Note  I connected with Julia Monroe on 07/25/20 at  4:00 PM EDT by a video enabled telemedicine application and verified that I am speaking with the correct person using two identifiers.  Location: Patient: Home  Provider: Clinic  This service was conducted via virtual visit.  Both audio and visual tools were used.  The patient was located at home. I was located in my office.  Consent was obtained prior to the virtual visit and is aware of possible charges through their insurance for this visit.  The patient is an established patient.  Dr. Estanislado Pandy, MD conducted the virtual visit and Hazel Sams, PA-C acted as scribe during the service.  Office staff helped with scheduling follow up visits after the service was conducted.     I discussed the limitations of evaluation and management by telemedicine and the availability of in person appointments. The patient expressed understanding and agreed to proceed.  CC: fatigue  History of Present Illness: Patient is a 50 year old with past medical history of systemic lupus erythematosus.  She is taking plaquenil 200 mg 1 tablet by mouth twice daily.  She denies any symptoms of a lupus flare recently. She has ongoing arthralgias and joint stiffness. She is planning on having her left knee replaced next year.  She had a recent cortisone injection in the left knee by her orthopedist, Dr. Mayer Camel, which has alleviated some of her discomfort.  She may proceed with a repeat visco injection soon.  She states both hip replacements are doing well.  She states her left elbow pain resolved about 2 weeks ago after having a cortisone injection.  She continues to have fatigue on a daily basis which she attributes to working a lot of overtime. She denies any shortness of breath, chest pain, or palpitations.  She denies any recent rashes, raynaud's, oral or nasal ulcers, sicca symptoms, or swollen lymph nodes. She is taking vitamin D 5,000 units  daily.  Review of Systems  Constitutional:  Positive for malaise/fatigue. Negative for fever.  Eyes:  Negative for photophobia, pain, discharge and redness.  Respiratory:  Negative for cough, shortness of breath and wheezing.   Cardiovascular:  Negative for chest pain and palpitations.  Gastrointestinal:  Negative for blood in stool, constipation and diarrhea.  Genitourinary:  Negative for dysuria.  Musculoskeletal:  Positive for joint pain. Negative for back pain, myalgias and neck pain.       +Joint stiffness Denies joint swelling   Skin:  Negative for rash.  Neurological:  Negative for dizziness and headaches.  Psychiatric/Behavioral:  Negative for depression. The patient is not nervous/anxious and does not have insomnia.      Observations/Objective: Physical Exam HENT:     Head: Normocephalic and atraumatic.  Eyes:     Conjunctiva/sclera: Conjunctivae normal.  Pulmonary:     Effort: Pulmonary effort is normal.  Neurological:     Mental Status: She is alert and oriented to person, place, and time.  Psychiatric:        Mood and Affect: Mood and affect normal.        Cognition and Memory: Memory normal.        Judgment: Judgment normal.   Patient reports morning stiffness for 20 minutes.   Patient reports nocturnal pain.  Difficulty dressing/grooming: Denies Difficulty climbing stairs: Denies  Difficulty getting out of chair: Reports Difficulty using hands for taps, buttons, cutlery, and/or writing: Denies   Assessment and Plan: Visit Diagnoses: Other systemic  lupus erythematosus with other organ involvement ([place]) - History of positive ANA, DS DNA, elevated ESR, arthralgias, rash:  She has not had any signs or symptoms of a lupus flare recently. She is clinically doing well taking plaquenil 200 mg 1 tablet by mouth twice daily.  She has not had any recent rashes, symptoms of Raynaud's, oral or nasal ulcerations, sicca symptoms, swollen lymph nodes, chest pain, pleuritic  chest pain, or palpitations.  She continues to have chronic fatigue which has been stable and which she attributes to working a lot of overtime recently.  She continues to experience intermittent arthralgias and joint stiffness.  She has been following along closely with Dr. Mayer Camel for chronic pain in the left knee joint and left elbow joint, but her discomfort has since improved since having cortisone injections earlier this month.  Lab work from 07/05/20 was reviewed today in the office: dsDNA is 17 (stable), complements WNL, ESR WNL, UA normal, glucose 151 rest of CMP WNL, and WBC count was 14.9.  She will remain on Plaquenil as prescribed.  She does not need a refill at this time.  She did request a refill Voltaren gel which she can buy topically as needed for joint pain relief. She was advised to notify us if she develops signs or symptoms of a flare.  She will follow up in 3 to 4 months.   High risk medication use - Plaquenil 200 mg 1 tablet by mouth twice daily. PLQ eye exam: 05/08/2019 Normal. [facility]. Follow up in 1 year. She is due to update PLQ eye exam.  Her next PLQ eye exam is scheduled in September 2022. CBC and CMP updated on 07/05/20.     Trapezius muscle spasm-Improved    Lateral epicondylitis, left elbow-She had a recent cortisone injection performed by Dr. Mayer Camel, which has resolved her discomfort.   Primary osteoarthritis of both knees-She has severe osteoarthritis of left knee. She had a recent cortisone injection performed by Dr. Mayer Camel, which has alleviated some of her discomfort.  She is planning on proceeding with another round of visco gel injections soon.  She requested a refill of voltaren gel, which was sent to the pharmacy.  She plans on proceeding with a left knee replacement next year.  She will continue to take tramadol as needed for pain relief.    Status post bilateral total hip replacement - Right total hip replacement October 2020 by Dr. Mayer Camel, [facility] 2013 by  Dr. Mayer Camel. Doing well.  She has no discomfort at this time.    Vitamin D deficiency-She is taking vitamin D 5,000 units daily.    Other fatigue-Chronic but stable. She has been working a lot of overtime.    History of migraine    Follow Up Instructions: She will follow up in 3-4 months.    I discussed the assessment and treatment plan with the patient. The patient was provided an opportunity to ask questions and all were answered. The patient agreed with the plan and demonstrated an understanding of the instructions.   The patient was advised to call back or seek an in-person evaluation if the symptoms worsen or if the condition fails to improve as anticipated.  I provided 20 minutes of non-face-to-face time during this encounter.  Scribed byHazel Sams, PA-C  I reviewed the above note and attest to the accuracy of the documentation. Bo Merino, MD

## 2020-07-26 ENCOUNTER — Telehealth: Payer: Self-pay

## 2020-07-26 NOTE — Telephone Encounter (Signed)
Attempted to contact patient and left message on machine to advise patient to call the office to schedule follow up appointment in 3-4 months, per Hazel Sams, PA-C.

## 2020-08-02 ENCOUNTER — Other Ambulatory Visit (HOSPITAL_BASED_OUTPATIENT_CLINIC_OR_DEPARTMENT_OTHER): Payer: Self-pay

## 2020-08-02 ENCOUNTER — Other Ambulatory Visit: Payer: Self-pay

## 2020-08-02 ENCOUNTER — Ambulatory Visit: Payer: 59 | Attending: Internal Medicine

## 2020-08-02 DIAGNOSIS — Z23 Encounter for immunization: Secondary | ICD-10-CM

## 2020-08-02 MED ORDER — PFIZER-BIONT COVID-19 VAC-TRIS 30 MCG/0.3ML IM SUSP
INTRAMUSCULAR | 0 refills | Status: DC
  Filled 2020-08-02: qty 0.3, 1d supply, fill #0

## 2020-08-12 ENCOUNTER — Other Ambulatory Visit (HOSPITAL_BASED_OUTPATIENT_CLINIC_OR_DEPARTMENT_OTHER): Payer: Self-pay

## 2020-08-14 ENCOUNTER — Other Ambulatory Visit (HOSPITAL_BASED_OUTPATIENT_CLINIC_OR_DEPARTMENT_OTHER): Payer: Self-pay

## 2020-09-10 DIAGNOSIS — M25562 Pain in left knee: Secondary | ICD-10-CM | POA: Diagnosis not present

## 2020-09-10 DIAGNOSIS — M1712 Unilateral primary osteoarthritis, left knee: Secondary | ICD-10-CM | POA: Diagnosis not present

## 2020-09-13 ENCOUNTER — Other Ambulatory Visit (HOSPITAL_BASED_OUTPATIENT_CLINIC_OR_DEPARTMENT_OTHER): Payer: Self-pay

## 2020-09-17 DIAGNOSIS — M1712 Unilateral primary osteoarthritis, left knee: Secondary | ICD-10-CM | POA: Diagnosis not present

## 2020-09-18 ENCOUNTER — Other Ambulatory Visit (HOSPITAL_BASED_OUTPATIENT_CLINIC_OR_DEPARTMENT_OTHER): Payer: Self-pay

## 2020-09-18 MED ORDER — COVID-19 AT HOME ANTIGEN TEST VI KIT
PACK | 0 refills | Status: DC
  Filled 2020-09-18: qty 2, 4d supply, fill #0

## 2020-09-24 DIAGNOSIS — M1712 Unilateral primary osteoarthritis, left knee: Secondary | ICD-10-CM | POA: Diagnosis not present

## 2020-10-03 ENCOUNTER — Encounter (HOSPITAL_COMMUNITY): Payer: Self-pay | Admitting: *Deleted

## 2020-10-17 ENCOUNTER — Other Ambulatory Visit (HOSPITAL_BASED_OUTPATIENT_CLINIC_OR_DEPARTMENT_OTHER): Payer: Self-pay

## 2020-10-17 MED ORDER — TRAMADOL HCL 50 MG PO TABS
ORAL_TABLET | ORAL | 2 refills | Status: DC
  Filled 2020-10-17: qty 90, 30d supply, fill #0
  Filled 2020-11-18: qty 90, 30d supply, fill #1
  Filled 2020-12-24: qty 90, 30d supply, fill #2

## 2020-10-21 IMAGING — CR DG UGI W/ HIGH DENSITY W/KUB
9 of 10 series · 14 of 24 positions shown · non-contrast
Comparison: None.

CLINICAL DATA: Preoperative evaluation for sleeve gastrectomy.

EXAM:
UPPER GI SERIES WITH KUB
TECHNIQUE: After obtaining a scout radiograph a routine upper GI series was
performed using thin barium
FLUOROSCOPY TIME:  Fluoroscopy Time:  1 minutes and 54 seconds.
Radiation Exposure Index (if provided by the fluoroscopic device):
159 mGy
Number of Acquired Spot Images:

[t abdomen supine]
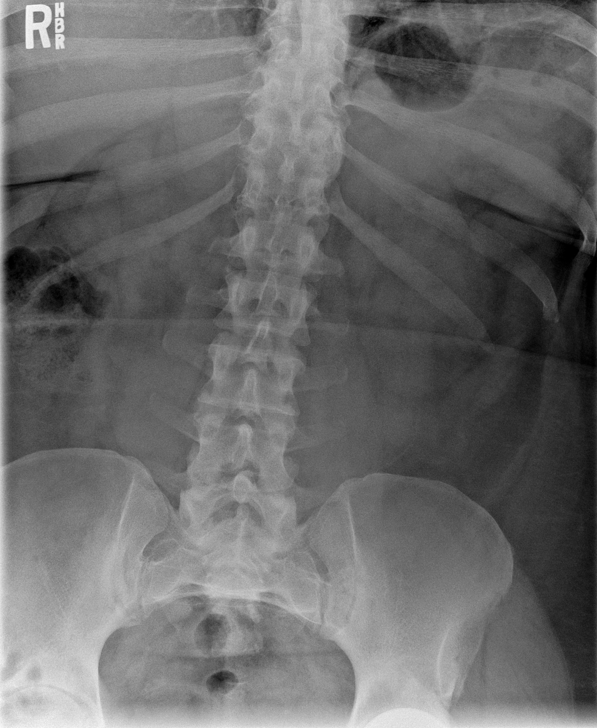

[fluoro_barium 2fps_bw (1 of 5) · tomo slice 2/5.0]
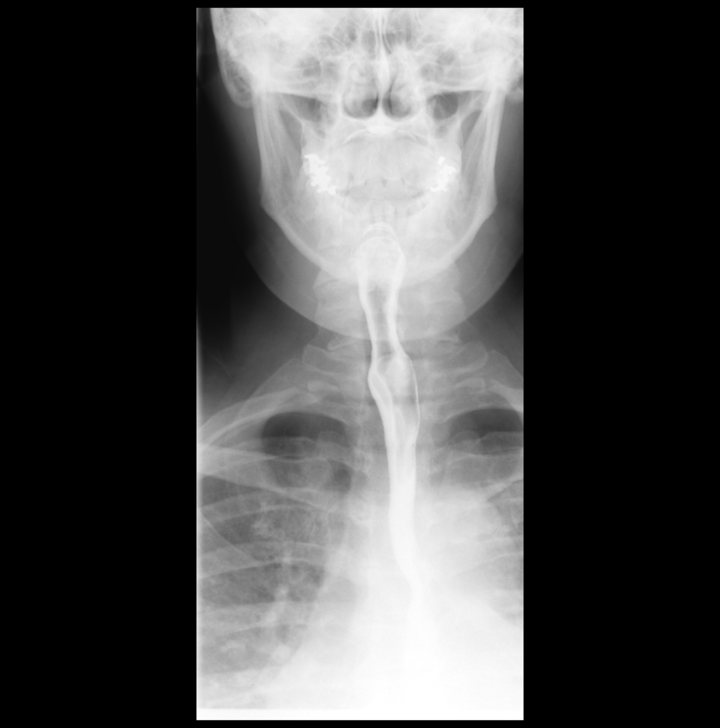

[Series 4: fluoro_barium 2fps_bw · 0.17mm/px · 2 of 8 frames shown (2 of 5)]
[frame 2/8]
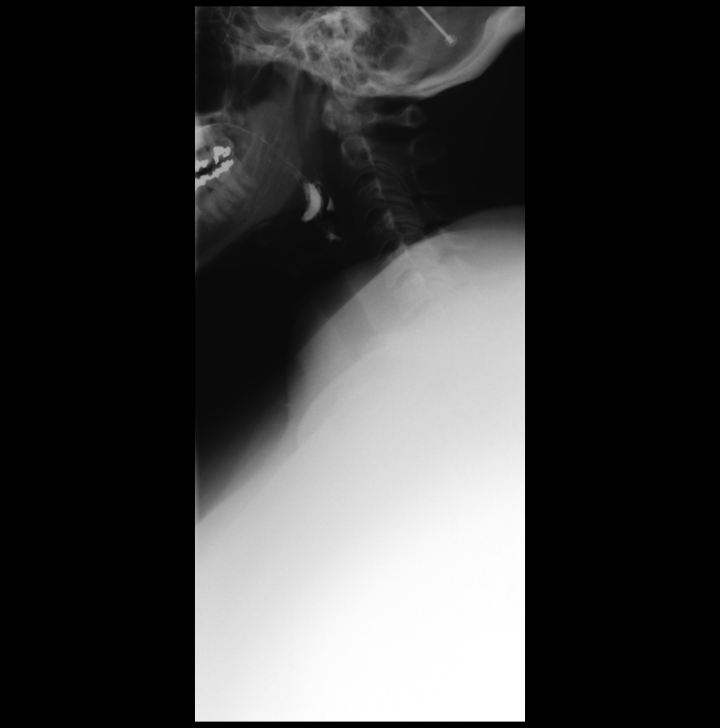
[frame 5/8]
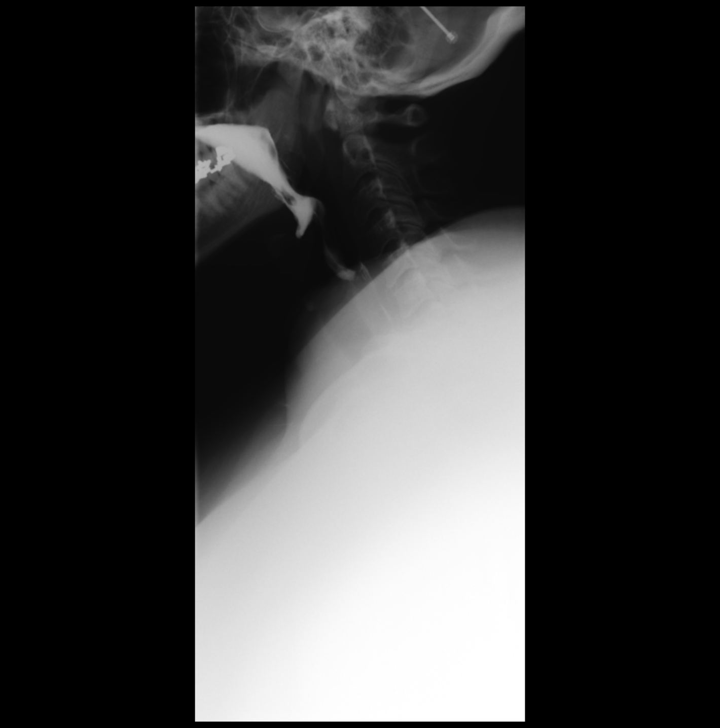

[Series 5: fluoro_barium 2fps_bw · 0.17mm/px · 2 of 19 frames shown (3 of 5)]
[frame 3/19]
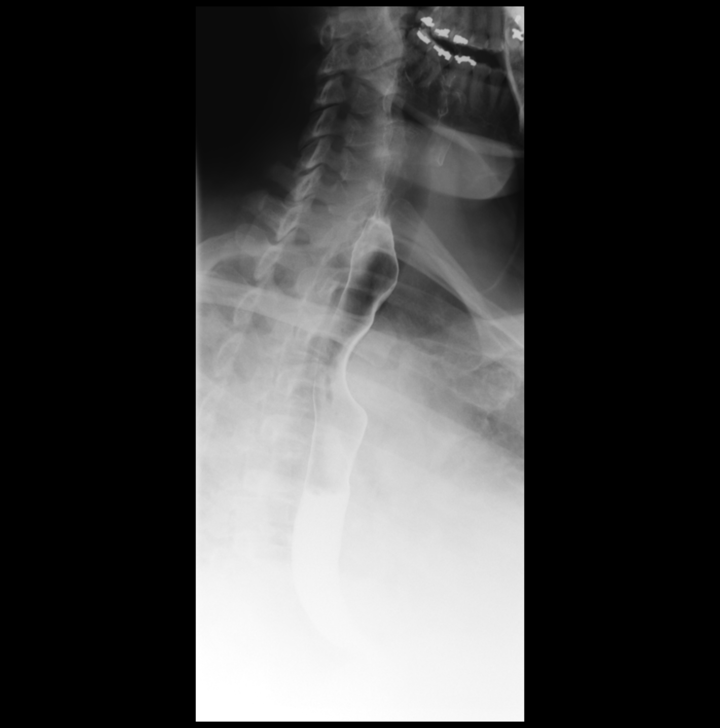
[frame 14/19]
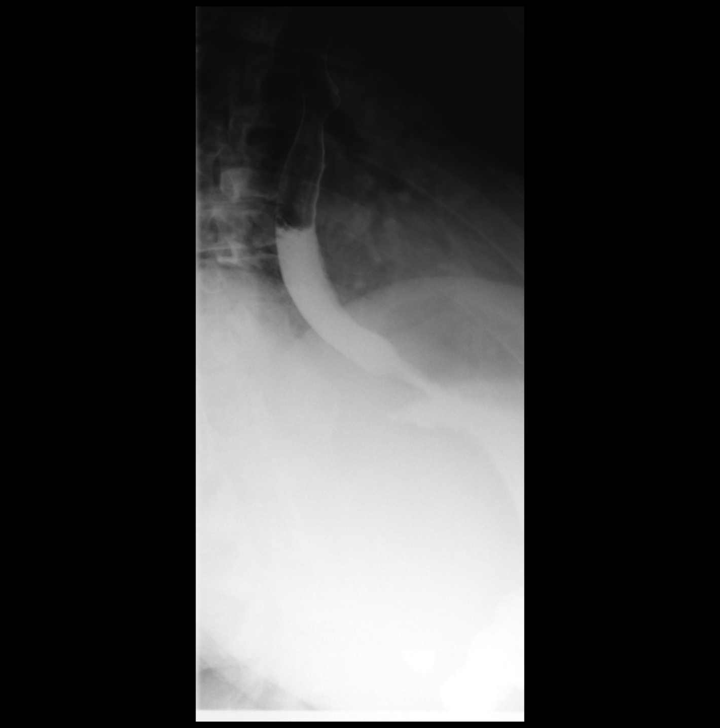

[Series 6: cp_standard · 0.51mm/px · 2 of 10 frames shown (1 of 3)]
[frame 1/10]
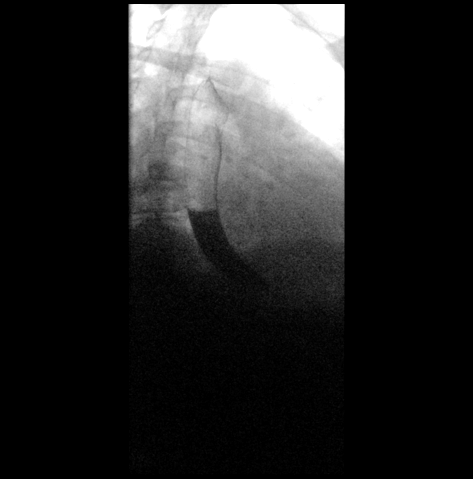
[frame 6/10]
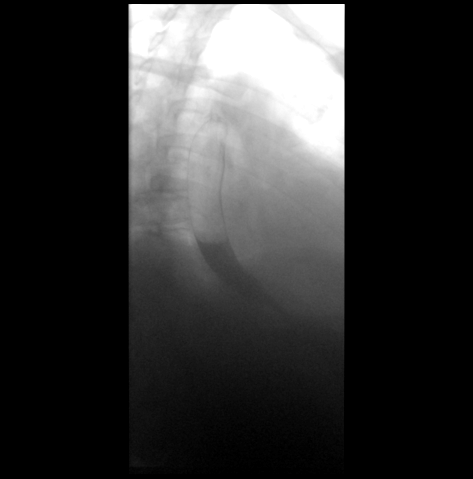

[Series 7: cp_standard · 0.56mm/px · 2 of 30 frames shown (2 of 3)]
[frame 3/30]
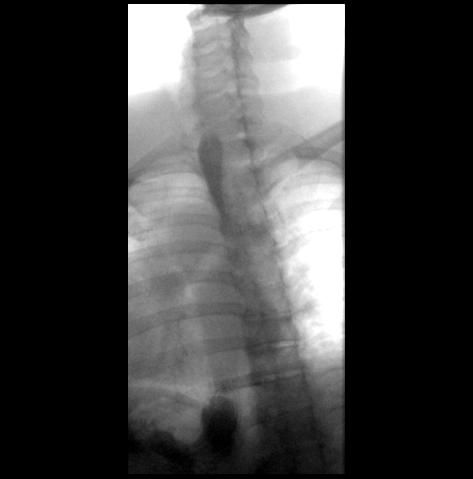
[frame 16/30]
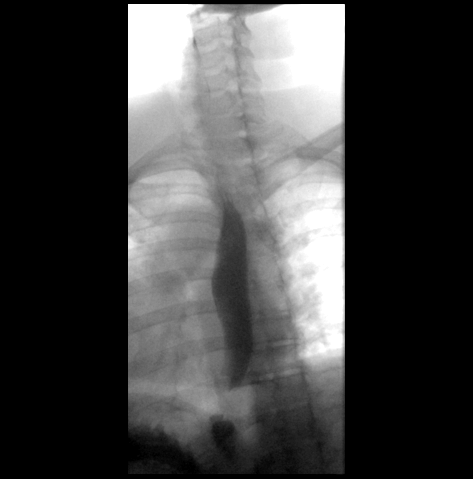

[Series 8: cp_standard · 0.56mm/px · 2 of 41 frames shown (3 of 3)]
[frame 21/41]
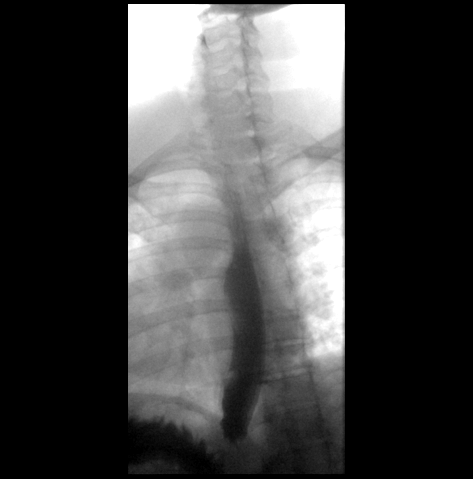
[frame 30/41]
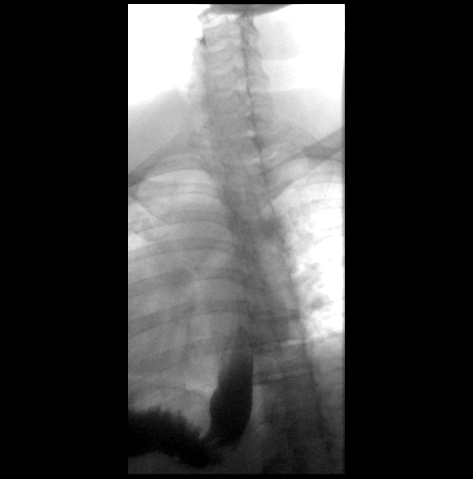

[fluoro_barium 2fps_bw (4 of 5)]
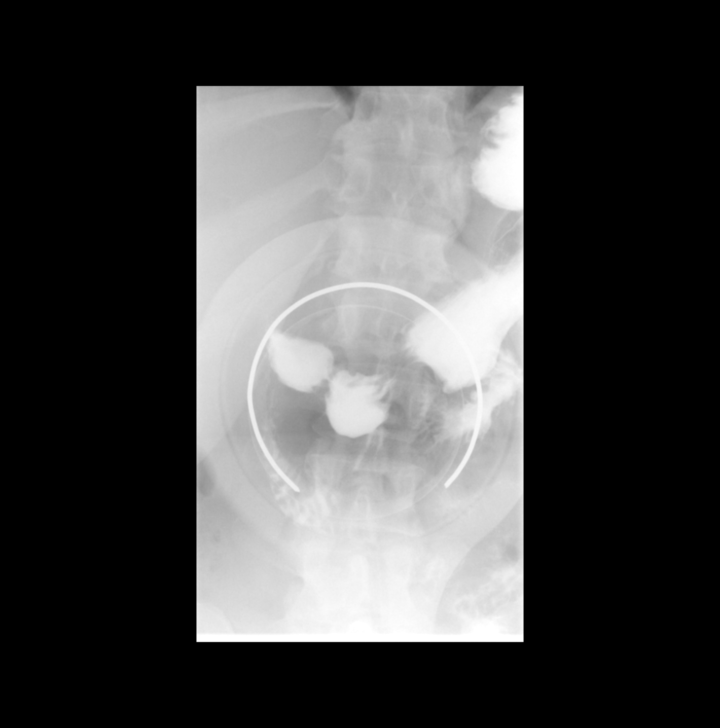

[fluoro_barium 2fps_bw (5 of 5)]
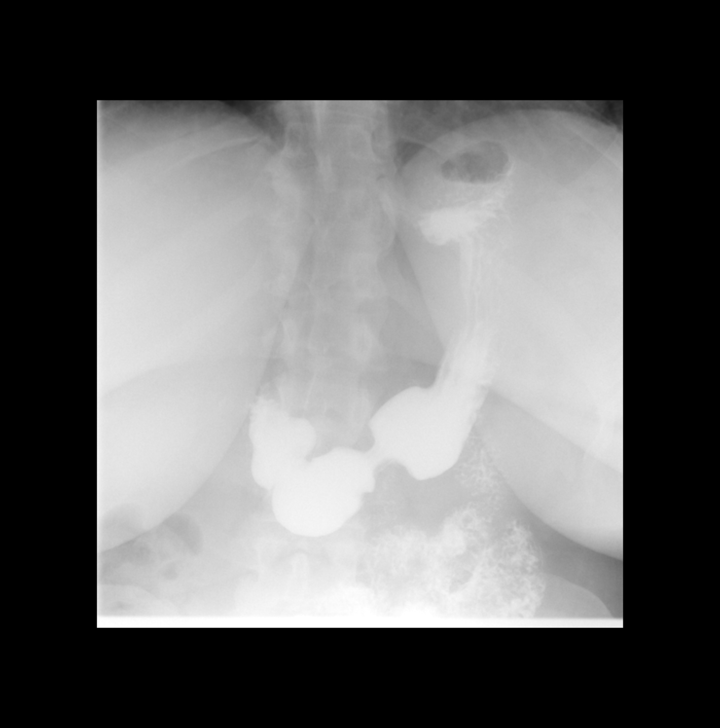

[14 of 24 positions shown; findings below may reference images not displayed]

FINDINGS: Pre-procedure KUB shows a normal bowel gas pattern with no
unexpected abdominopelvic calcification.

Frontal and lateral views of the hypopharynx while swallowing are
normal. Oblique views of the esophagus are normal without evidence
for stricture, mass lesion, or gross mucosal ulceration. Esophageal
motility is normal.

No hiatal hernia. Stomach is normal without evidence for mucosal
ulceration or mass lesion. Gastric emptying is prompt. Pylorus is
normal. Duodenal C-loop and ligament of Treitz are in the expected
anatomic location.
IMPRESSION: Normal single contrast upper GI series.

## 2020-10-21 IMAGING — CR DG CHEST 2V
2 series · 2 of 2 positions shown · non-contrast
Comparison: June 29, 2011.

CLINICAL DATA: Preoperative evaluation for gastric sleeve procedure

EXAM:
CHEST - 2 VIEW

[w chest pa]
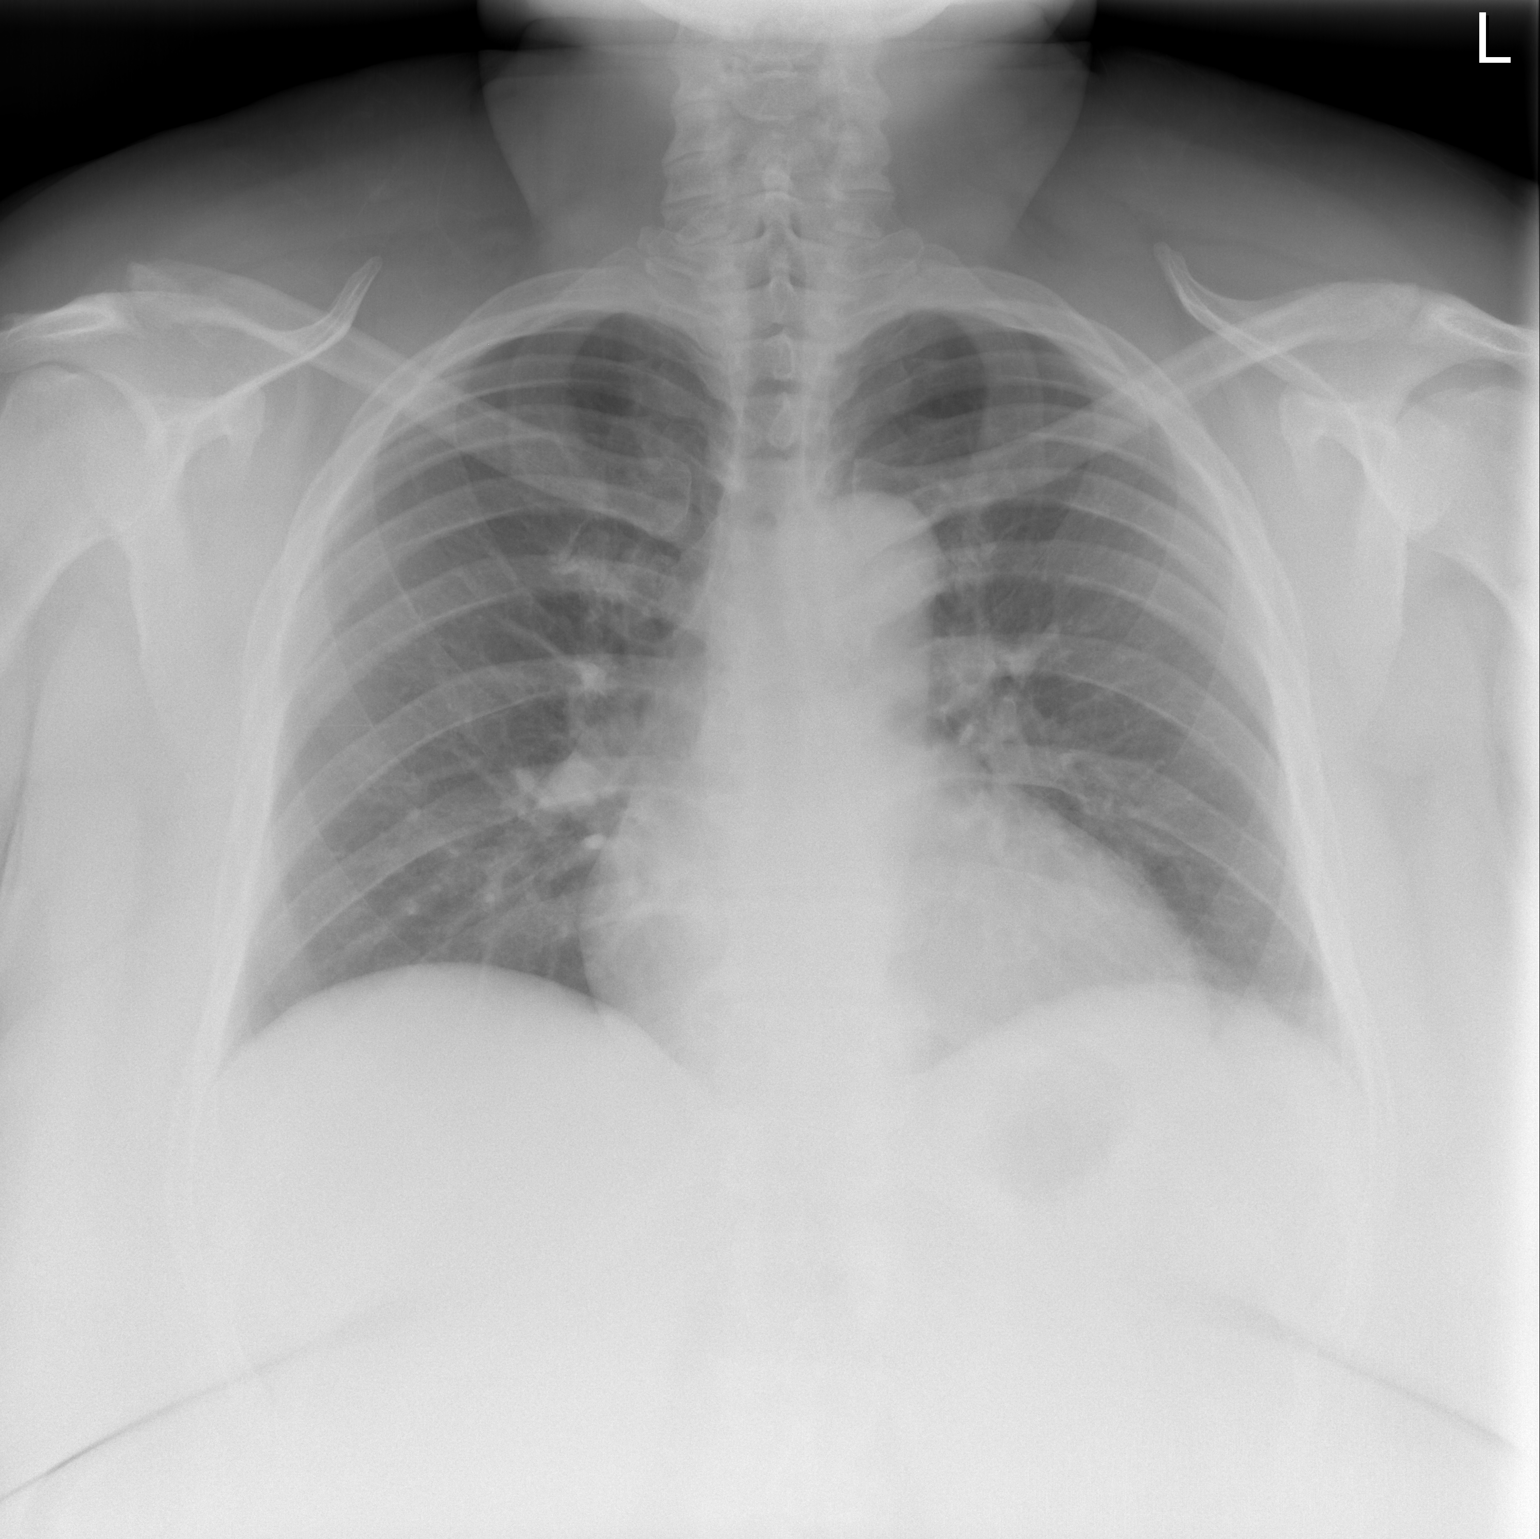

[w chest lat]
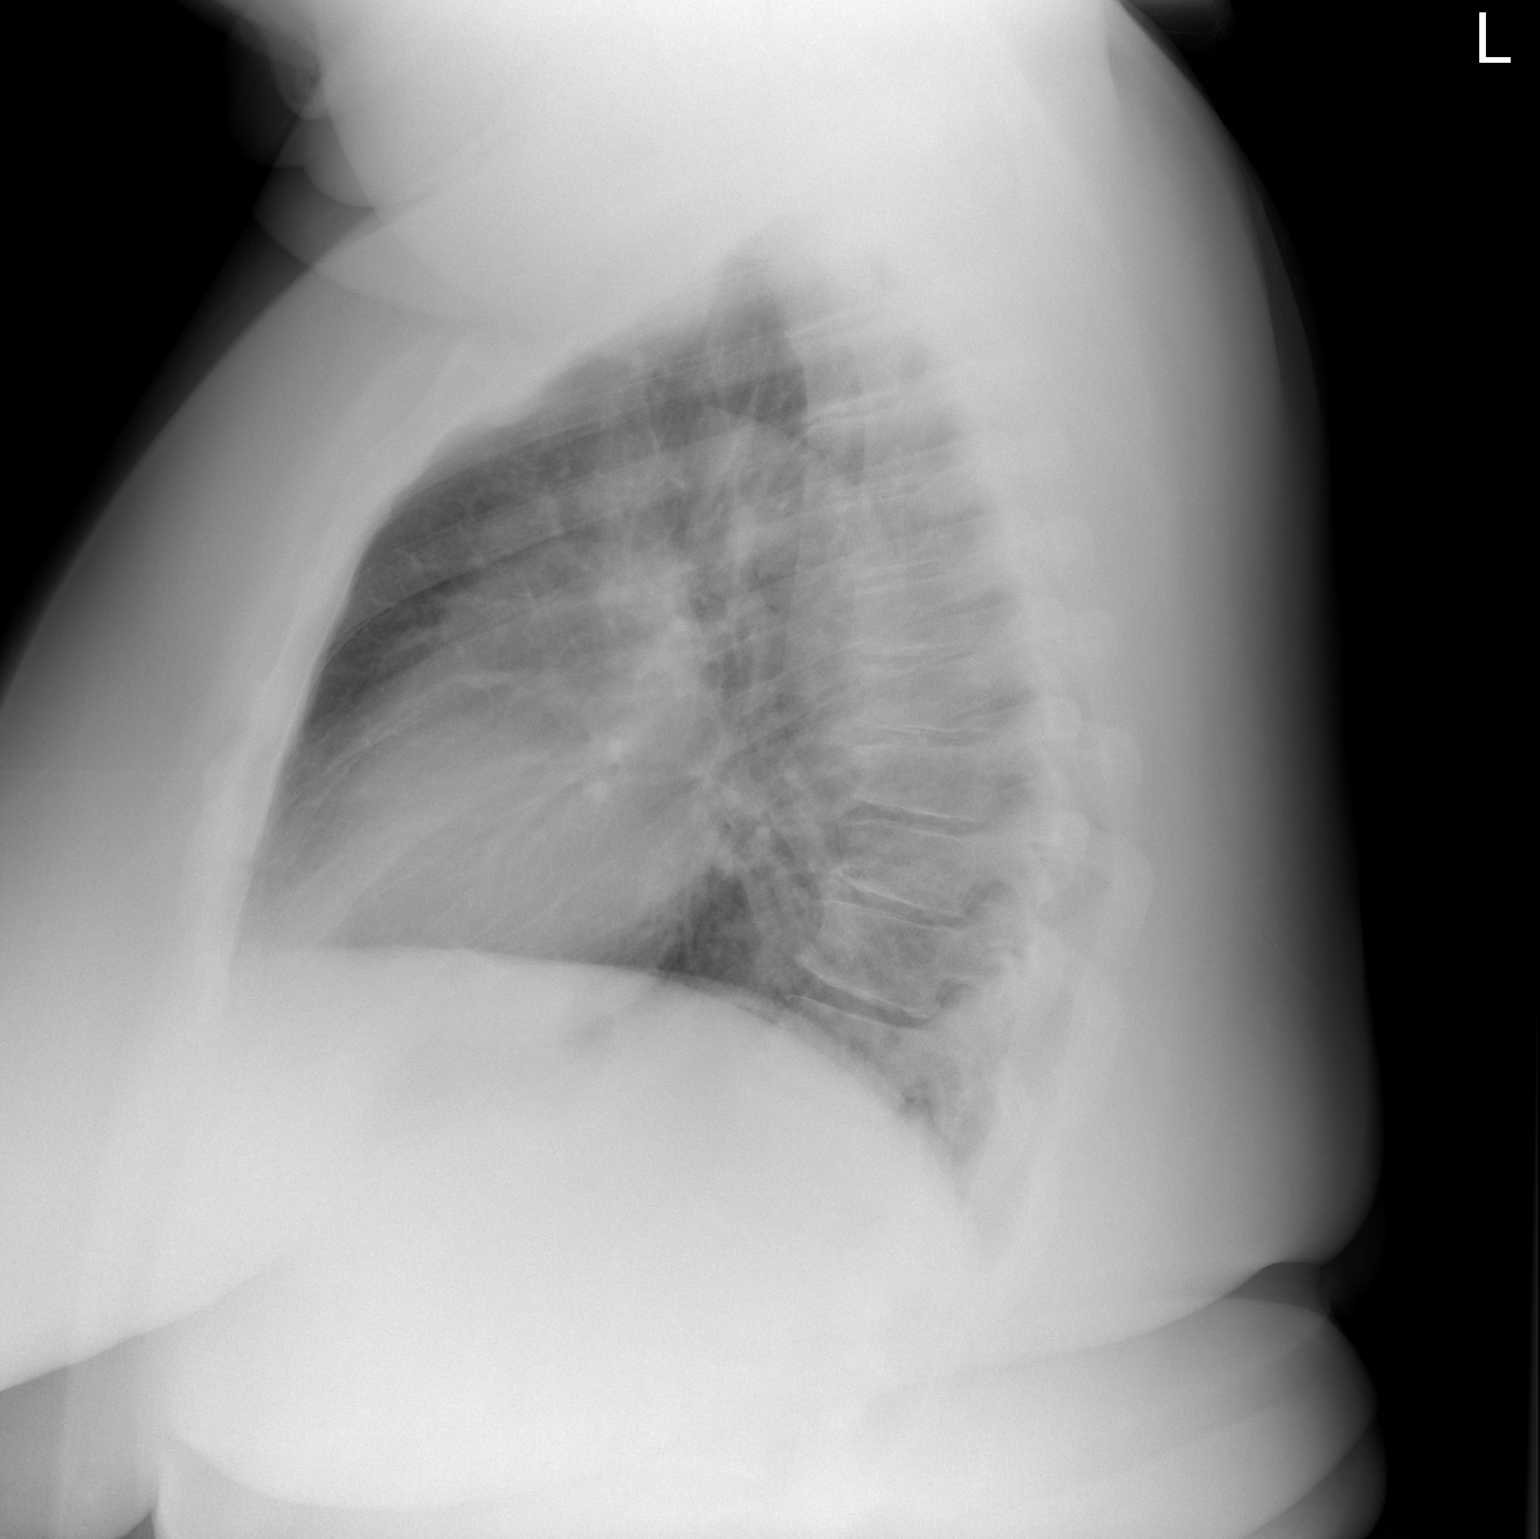

[2 of 2 positions shown; findings below may reference images not displayed]

FINDINGS: No edema or consolidation. The heart size and pulmonary vascularity
are normal. No adenopathy. There is slight degenerative change in
the thoracic spine.
IMPRESSION: No edema or consolidation.

## 2020-10-21 NOTE — Progress Notes (Deleted)
Office Visit Note  Patient: Julia Monroe             Date of Birth: Aug 13, 1970           MRN: [id number]             PCP: Billie Ruddy, MD Referring: Billie Ruddy, MD Visit Date: 10/23/2020 Occupation: @GUAROCC @  Subjective:  No chief complaint on file.   History of Present Illness: Asma Javayah Magaw is a 50 y.o. female ***   Activities of Daily Living:  Patient reports morning stiffness for *** {minute/hour:19697}.   Patient {ACTIONS;DENIES/REPORTS:21021675::"Denies"} nocturnal pain.  Difficulty dressing/grooming: {ACTIONS;DENIES/REPORTS:21021675::"Denies"} Difficulty climbing stairs: {ACTIONS;DENIES/REPORTS:21021675::"Denies"} Difficulty getting out of chair: {ACTIONS;DENIES/REPORTS:21021675::"Denies"} Difficulty using hands for taps, buttons, cutlery, and/or writing: {ACTIONS;DENIES/REPORTS:21021675::"Denies"}  No Rheumatology ROS completed.   PMFS History:  Patient Active Problem List   Diagnosis Date Noted   History of total hip arthroplasty, right 11/21/2018   Prediabetes 03/21/2018   SLE (systemic lupus erythematosus) ([place]) 03/21/2018   Obesity 03/21/2018   History of migraine 08/27/2017   Class 3 severe obesity due to excess calories without serious comorbidity with body mass index (BMI) of 50.0 to 59.9 in adult ([place]) 01/27/2017   Status post bilateral total hip replacement 10/27/2016   Hyperglycemia 08/25/2016   Leukocytosis 08/13/2016   Status post laparoscopic hysterectomy 07/06/2016   Primary osteoarthritis of both knees 04/29/2016   ANA positive 04/29/2016   Primary osteoarthritis of right hip 07/10/2011    Past Medical History:  Diagnosis Date   Arthritis    osteoarthritis   Complication of anesthesia    bottom of feet itching   Elevated hemoglobin A1c 2018   6.1   Herpes simplex type II infection 2020   Lupus ([place]) 2018   Dr.Deveshwar, currently in normal range   Migraines     Family History  Problem Relation Age of Onset    Osteoarthritis Mother    Osteoarthritis Brother    Hypertension Brother    Diabetes Brother    Osteoarthritis Maternal Grandmother    Arthritis Maternal Grandmother    Past Surgical History:  Procedure Laterality Date   ABDOMINAL HYSTERECTOMY     CESAREAN SECTION  2000   CYSTOSCOPY N/A 07/06/2016   Procedure: CYSTOSCOPY;  Surgeon: Nunzio Cobbs, MD;  Location: [facility];  Service: Gynecology;  Laterality: N/A;   ingrown toenail Left 07/28/2019   JOINT REPLACEMENT  L   Left hip replacement   LAPAROSCOPIC GASTRIC SLEEVE RESECTION N/A 03/21/2018   Procedure: LAPAROSCOPIC GASTRIC SLEEVE RESECTION, UPPER ENDO, ERAS Pathway;  Surgeon: [name], MD;  Location: [facility];  Service: General;  Laterality: N/A;   OOPHORECTOMY  2007   Rt.oophorectomy    OVARIAN CYST SURGERY  ? and 10   rt removed,cyst off [place] ARTHROPLASTY  07/06/2011   Procedure: TOTAL HIP ARTHROPLASTY;  Surgeon: Kerin Salen, MD;  Location: [place];  Service: Orthopedics;  Laterality: Left;  DEPUY/PINNACLE   TOTAL HIP ARTHROPLASTY Right 11/21/2018   Procedure: RIGHT TOTAL HIP ARTHROPLASTY ANTERIOR APPROACH;  Surgeon: Frederik Pear, MD;  [facility]: [facility];  Service: Orthopedics;  Laterality: Right;   TOTAL LAPAROSCOPIC HYSTERECTOMY WITH SALPINGECTOMY Left 07/06/2016   Procedure: HYSTERECTOMY TOTAL LAPAROSCOPIC WITH Left SALPINGECTOMY, Suture Left Side Wall Laceration;  Surgeon: Nunzio Cobbs, MD;  Location: [facility];  Service: Gynecology;  Laterality: Left;   Social History   Social History Narrative   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[employer]  Home Situation: 82 yo daughter in 2018      Spiritual Beliefs:      Lifestyle: starting healthy low sugar diet 08/2016[facility]   Immunization History  Administered Date(s) Administered   Influenza-Unspecified 10/26/2013, 10/26/2016   PFIZER Comirnaty(Gray Top)Covid-19 Tri-Sucrose Vaccine 08/02/2020   PFIZER(Purple Top)SARS-COV-2  Vaccination 04/28/2019, 05/23/2019, 01/12/2020   Pneumococcal Conjugate-13 11/02/2016   Tdap 01/26/2009, 12/20/2018     Objective: Vital Signs: LMP 04/29/2016    Physical Exam   Musculoskeletal Exam: ***  CDAI Exam: CDAI Score: -- Patient Global: --; Provider Global: -- Swollen: --; Tender: -- Joint Exam 10/23/2020   No joint exam has been documented for this visit   There is currently no information documented on the homunculus. Go to the Rheumatology activity and complete the homunculus joint exam.  Investigation: No additional findings.  Imaging: No results found.  Recent Labs: Lab Results  Component Value Date   WBC 14.9 (H) 07/05/2020   HGB 13.8 07/05/2020   PLT 236 07/05/2020   NA 140 07/05/2020   K 3.7 07/05/2020   CL 105 07/05/2020   CO2 23 07/05/2020   GLUCOSE 151 (H) 07/05/2020   BUN 18 07/05/2020   CREATININE 0.80 07/05/2020   BILITOT 0.3 07/05/2020   ALKPHOS 49 05/11/2018   AST 15 07/05/2020   ALT 13 07/05/2020   PROT 7.7 07/05/2020   ALBUMIN 4.1 05/11/2018   CALCIUM 10.0 07/05/2020   GFRAA 101 07/05/2020    Speciality Comments: PLQ eye exam: 05/08/2019 Normal. [employer]. Follow up in 1 year.    Procedures:  No procedures performed Allergies: Monosodium glutamate, Hydromorphone, Lactose intolerance (gi), Caffeine, Hydrocodone, and Oxycodone   Assessment / Plan:     Visit Diagnoses: No diagnosis found.  Orders: No orders of the defined types were placed in this encounter.  No orders of the defined types were placed in this encounter.   Face-to-face time spent with patient was *** minutes. Greater than 50% of time was spent in counseling and coordination of care.  Follow-Up Instructions: No follow-ups on file.   Earnestine Mealing, CMA  Note - This record has been created using Editor, commissioning.  Chart creation errors have been sought, but may not always  have been located. Such creation errors do not reflect on  the  standard of medical care.

## 2020-10-23 ENCOUNTER — Ambulatory Visit: Payer: 59 | Admitting: Rheumatology

## 2020-10-23 DIAGNOSIS — Z79899 Other long term (current) drug therapy: Secondary | ICD-10-CM

## 2020-10-23 DIAGNOSIS — M3219 Other organ or system involvement in systemic lupus erythematosus: Secondary | ICD-10-CM

## 2020-10-23 DIAGNOSIS — Z8669 Personal history of other diseases of the nervous system and sense organs: Secondary | ICD-10-CM

## 2020-10-23 DIAGNOSIS — Z96643 Presence of artificial hip joint, bilateral: Secondary | ICD-10-CM

## 2020-10-23 DIAGNOSIS — M170 Bilateral primary osteoarthritis of knee: Secondary | ICD-10-CM

## 2020-10-23 DIAGNOSIS — E559 Vitamin D deficiency, unspecified: Secondary | ICD-10-CM

## 2020-10-23 DIAGNOSIS — R5383 Other fatigue: Secondary | ICD-10-CM

## 2020-10-23 DIAGNOSIS — M7712 Lateral epicondylitis, left elbow: Secondary | ICD-10-CM

## 2020-10-23 DIAGNOSIS — M62838 Other muscle spasm: Secondary | ICD-10-CM

## 2020-11-04 ENCOUNTER — Encounter: Payer: Self-pay | Admitting: Obstetrics and Gynecology

## 2020-11-04 ENCOUNTER — Other Ambulatory Visit: Payer: Self-pay

## 2020-11-04 ENCOUNTER — Ambulatory Visit (INDEPENDENT_AMBULATORY_CARE_PROVIDER_SITE_OTHER): Payer: 59 | Admitting: Obstetrics and Gynecology

## 2020-11-04 VITALS — BP 118/78 | HR 77 | Ht 69.5 in | Wt 264.0 lb

## 2020-11-04 DIAGNOSIS — Z01419 Encounter for gynecological examination (general) (routine) without abnormal findings: Secondary | ICD-10-CM

## 2020-11-04 NOTE — Progress Notes (Signed)
50 y.o. G1P1001 Single African American female here for annual exam.    Feels cold.  Normal hemoglobin.   No hot flashes or night sweats.  Mild vaginal dryness.   No new partner.  Declines STD screening.  Dealing with joint pain.  Hoping to come off Plaquenil. Has an upcoming with her rheumatologist.   A1C 5.1 on 07/13/19.  Working at [employer].   PCP:  Grier Mitts, MD  Patient's last menstrual period was 04/29/2016.           Sexually active: Yes.    The current method of family planning is status post hysterectomy.    Exercising: Yes.     Walks daily Smoker:  no  Health Maintenance: Pap:  08-24-15 Neg:Neg HR HPV.  Final cervical pathology specimen at hysterectomy was normal. History of abnormal Pap:  no MMG:  03-18-20 Neg/Birads1 Colonoscopy:  never BMD:   n/a  Result  n/a TDaP:  12-20-18 Gardasil:   no HIV: Neg years ago Hep C: 12-28-18 Neg Screening Labs:  PCP. Flu vaccine:  completed last week.  Covid:  completed plus 2 boosters.    reports that she has never smoked. She has never used smokeless tobacco. She reports that she does not drink alcohol and does not use drugs.  Past Medical History:  Diagnosis Date   Arthritis    osteoarthritis   Complication of anesthesia    bottom of feet itching   Elevated hemoglobin A1c 2018   6.1   Herpes simplex type II infection 2020   Lupus ([place]) 2018   Dr.Deveshwar, currently in normal range   Migraines     Past Surgical History:  Procedure Laterality Date   ABDOMINAL HYSTERECTOMY     CESAREAN SECTION  2000   CYSTOSCOPY N/A 07/06/2016   Procedure: CYSTOSCOPY;  Surgeon: Nunzio Cobbs, MD;  Location: [facility];  Service: Gynecology;  Laterality: N/A;   ingrown toenail Left 07/28/2019   JOINT REPLACEMENT  L   Left hip replacement   LAPAROSCOPIC GASTRIC SLEEVE RESECTION N/A 03/21/2018   Procedure: LAPAROSCOPIC GASTRIC SLEEVE RESECTION, UPPER ENDO, ERAS Pathway;  Surgeon: [name], MD;  [facility];  Service: General;  Laterality: N/A;   OOPHORECTOMY  2007   Rt.oophorectomy    OVARIAN CYST SURGERY  ? and 10   rt removed,cyst off [place] ARTHROPLASTY  07/06/2011   Procedure: TOTAL HIP ARTHROPLASTY;  Surgeon: Kerin Salen, MD;  Location: [facility];  Service: Orthopedics;  Laterality: Left;  DEPUY/PINNACLE   TOTAL HIP ARTHROPLASTY Right 11/21/2018   Procedure: RIGHT TOTAL HIP ARTHROPLASTY ANTERIOR APPROACH;  Surgeon: Frederik Pear, MD;  [facility];  Service: Orthopedics;  Laterality: Right;   TOTAL LAPAROSCOPIC HYSTERECTOMY WITH SALPINGECTOMY Left 07/06/2016   Procedure: HYSTERECTOMY TOTAL LAPAROSCOPIC WITH Left SALPINGECTOMY, Suture Left Side Wall Laceration;  Surgeon: Nunzio Cobbs, MD;  Location: [facility];  Service: Gynecology;  Laterality: Left;    Current Outpatient Medications  Medication Sig Dispense Refill   acetaminophen (TYLENOL) 500 MG tablet Take 1,000 mg by mouth every 6 (six) hours as needed for moderate pain.      calcium carbonate (OS-CAL - DOSED IN MG OF ELEMENTAL CALCIUM) 1250 (500 Ca) MG tablet Take 500 mg by mouth 3 (three) times daily.     CASCARA SAGRADA PO Take by mouth daily.     Cholecalciferol (VITAMIN D) 125 MCG (5000 UT) CAPS Take 5,000 Units by mouth daily.  COVID-19 At Home Antigen Test KIT Use as directed 2 kit 0   COVID-19 mRNA Vac-TriS, Pfizer, (PFIZER-BIONT COVID-19 VAC-TRIS) SUSP injection Inject into the muscle. 0.3 mL 0   diclofenac (VOLTAREN) 50 MG EC tablet Take 1 tablet (50 mg total) by mouth as needed (migraine). 20 tablet 0   diclofenac Sodium (VOLTAREN) 1 % GEL APPLY 2 TO 4 GRAMS TO AFFECTED JOINT UP TO 4 TIMES DAILY 400 g 2   fexofenadine (ALLEGRA) 180 MG tablet Take 180 mg by mouth daily as needed (allergies).      hydroxychloroquine (PLAQUENIL) 200 MG tablet TAKE 1 TABLET BY MOUTH 2 TIMES DAILY 180 tablet 0   lidocaine (XYLOCAINE) 5 % ointment Apply 1 application topically 3 (three) times daily. Use as needed. 1.25 g 0    Menthol, Topical Analgesic, (BIOFREEZE EX) Apply 1 application topically 2 (two) times daily as needed (pain).     Multiple Vitamins-Minerals (BARIATRIC MULTIVITAMINS/IRON PO) Take 1 tablet by mouth 2 (two) times daily.     Tetrahydrozoline HCl (VISINE OP) Place 1 drop into both eyes 3 (three) times daily as needed (for irritated/dry eyes.).     traMADol (ULTRAM) 50 MG tablet Take 1 tablet (50 mg total) by mouth every 6 (six) hours as needed for severe pain. 15 tablet 0   traMADol (ULTRAM) 50 MG tablet Take 1 tablet by mouth 3 times a day as needed for pain 90 tablet 2   No current facility-administered medications for this visit.    Family History  Problem Relation Age of Onset   Osteoarthritis Mother    Osteoarthritis Brother    Hypertension Brother    Diabetes Brother    Osteoarthritis Maternal Grandmother    Arthritis Maternal Grandmother     Review of Systems  All other systems reviewed and are negative.  Exam:   BP 118/78   Pulse 77   Ht 5' 9.5" (1.765 m)   Wt 264 lb (119.7 kg)   LMP 04/29/2016   SpO2 98%   BMI 38.43 kg/m     General appearance: alert, cooperative and appears stated age Head: normocephalic, without obvious abnormality, atraumatic Neck: no adenopathy, supple, symmetrical, trachea midline and thyroid normal to inspection and palpation Lungs: clear to auscultation bilaterally Breasts: normal appearance, no masses or tenderness, No nipple retraction or dimpling, No nipple discharge or bleeding, No axillary adenopathy Heart: regular rate and rhythm Abdomen: soft, non-tender; no masses, no organomegaly Extremities: extremities normal, atraumatic, no cyanosis or edema Skin: skin color, texture, turgor normal. No rashes or lesions Lymph nodes: cervical, supraclavicular, and axillary nodes normal. Neurologic: grossly normal  Pelvic: External genitalia:  no lesions              No abnormal inguinal nodes palpated.              Urethra:  normal appearing  urethra with no masses, tenderness or lesions              Bartholins and Skenes: normal                 Vagina: normal appearing vagina with normal color and discharge, no lesions              Cervix: absent              Pap taken: no Bimanual Exam:  Uterus:  absent              Adnexa: no mass, fullness, tenderness  Rectal exam: yes.  Confirms.              Anus:  normal sphincter tone, no lesions  Chaperone was present for exam:  Estill Bamberg, CMA.  Assessment:   Well woman visit with gynecologic exam. Status post gastric sleeve surgery.  Status post RSO.  Status post total laparoscopic hysterectomy with left salpingectomy/cystoscopy.  Still has left ovary.  Arthritic pain. On Plaquenil.  Lupus. Hx AODM.  Normal A1C.   Plan: Mammogram screening discussed. Self breast awareness reviewed. Pap and HR HPV as above. Guidelines for Calcium, Vitamin D, regular exercise program including cardiovascular and weight bearing exercise. She will check with her PCP regarding a stool card kit for colon cancer screning.  Follow up annually and prn.   After visit summary provided.

## 2020-11-04 NOTE — Patient Instructions (Signed)

## 2020-11-15 ENCOUNTER — Other Ambulatory Visit: Payer: Self-pay | Admitting: Rheumatology

## 2020-11-15 ENCOUNTER — Other Ambulatory Visit (HOSPITAL_BASED_OUTPATIENT_CLINIC_OR_DEPARTMENT_OTHER): Payer: Self-pay

## 2020-11-15 DIAGNOSIS — M3219 Other organ or system involvement in systemic lupus erythematosus: Secondary | ICD-10-CM

## 2020-11-15 MED ORDER — HYDROXYCHLOROQUINE SULFATE 200 MG PO TABS
ORAL_TABLET | Freq: Two times a day (BID) | ORAL | 0 refills | Status: AC
  Filled 2020-11-15: qty 180, 90d supply, fill #0

## 2020-11-15 NOTE — Telephone Encounter (Signed)
Next Visit: 12/11/2020  Last Visit: 07/25/2020  Labs: 07/05/2020  Glucose is elevated.  White cell count is elevated.   Eye exam: 05/08/2019 Normal. eye appointment on Nov. 4th  Current Dose per office note 07/25/2020: - Plaquenil 200 mg 1 tablet by mouth twice daily  KZ:LDJTT systemic lupus erythematosus with other organ involvement   Last Fill: 07/08/2020  Okay to refill Plaquenil?

## 2020-11-15 NOTE — Telephone Encounter (Signed)
Patient has been scheduled for an eye appointment on Nov. 4th. Patient request a refill on Plaquenil sent to [facility] location.

## 2020-11-18 ENCOUNTER — Other Ambulatory Visit (HOSPITAL_BASED_OUTPATIENT_CLINIC_OR_DEPARTMENT_OTHER): Payer: Self-pay

## 2020-11-22 ENCOUNTER — Other Ambulatory Visit (HOSPITAL_BASED_OUTPATIENT_CLINIC_OR_DEPARTMENT_OTHER): Payer: Self-pay

## 2020-11-22 DIAGNOSIS — M329 Systemic lupus erythematosus, unspecified: Secondary | ICD-10-CM | POA: Diagnosis not present

## 2020-11-22 DIAGNOSIS — Z87898 Personal history of other specified conditions: Secondary | ICD-10-CM | POA: Diagnosis not present

## 2020-11-22 DIAGNOSIS — Z9884 Bariatric surgery status: Secondary | ICD-10-CM | POA: Diagnosis not present

## 2020-11-22 MED ORDER — PHENTERMINE HCL 37.5 MG PO CAPS
ORAL_CAPSULE | ORAL | 0 refills | Status: DC
  Filled 2020-11-22: qty 30, 30d supply, fill #0

## 2020-11-27 NOTE — Progress Notes (Signed)
Office Visit Note  Patient: Julia Monroe             Date of Birth: 1970/07/12           MRN: [id number]             PCP: Billie Ruddy, MD Referring: Billie Ruddy, MD Visit Date: 12/11/2020 Occupation: @GUAROCC @  Subjective:  Medication monitoring   History of Present Illness: Analie Ugochi Henzler is a 50 y.o. female with a history of systemic lupus erythematosus.  She states she has still off-and-on discomfort in her left lateral epicondyle region but it is improved.  She denies any history of oral ulcers, nasal ulcers, sicca symptoms, malar rash, photosensitivity, Raynaud's phenomenon or lymphadenopathy.  She has been going to massage therapist for trapezius muscle spasm which has been helpful.  She states she has reduced the dose of hydroxychloroquine to 1 tablet daily 3 weeks ago as she was feeling better.  She continues to have some discomfort in her left knee.  She states that she has been getting cortisone injections and Visco supplement injections with Dr. Mayer Camel.  Her bilateral hip replacements are doing well.  Activities of Daily Living:  Patient reports morning stiffness for all day. Patient Reports nocturnal pain.  Difficulty dressing/grooming: Denies Difficulty climbing stairs: Denies Difficulty getting out of chair: Denies Difficulty using hands for taps, buttons, cutlery, and/or writing: Denies  Review of Systems  Constitutional:  Negative for fatigue.  HENT:  Negative for mouth sores, mouth dryness and nose dryness.   Eyes:  Negative for pain, itching and dryness.  Respiratory:  Negative for shortness of breath and difficulty breathing.   Cardiovascular:  Negative for chest pain and palpitations.  Gastrointestinal:  Negative for blood in stool, constipation and diarrhea.  Endocrine: Negative for increased urination.  Genitourinary:  Negative for difficulty urinating.  Musculoskeletal:  Positive for joint pain, joint pain and morning stiffness.  Negative for joint swelling, myalgias, muscle tenderness and myalgias.  Skin:  Negative for color change, rash, redness and sensitivity to sunlight.  Allergic/Immunologic: Negative for susceptible to infections.  Neurological:  Positive for numbness. Negative for dizziness, headaches, memory loss and weakness.  Hematological:  Negative for bruising/bleeding tendency and swollen glands.  Psychiatric/Behavioral:  Negative for depressed mood, confusion and sleep disturbance. The patient is not nervous/anxious.    PMFS History:  Patient Active Problem List   Diagnosis Date Noted   History of total hip arthroplasty, right 11/21/2018   Prediabetes 03/21/2018   SLE (systemic lupus erythematosus) ([place]) 03/21/2018   Obesity 03/21/2018   History of migraine 08/27/2017   Class 3 severe obesity due to excess calories without serious comorbidity with body mass index (BMI) of 50.0 to 59.9 in adult [facility]) 01/27/2017   Status post bilateral total hip replacement 10/27/2016   Hyperglycemia 08/25/2016   Leukocytosis 08/13/2016   Status post laparoscopic hysterectomy 07/06/2016   Primary osteoarthritis of both knees 04/29/2016   ANA positive 04/29/2016   Primary osteoarthritis of right hip 07/10/2011    Past Medical History:  Diagnosis Date   Arthritis    osteoarthritis   Complication of anesthesia    bottom of feet itching   Elevated hemoglobin A1c 2018   6.1   Herpes simplex type II infection 2020   Lupus ([place]) 2018   Dr.Shjon Lizarraga, currently in normal range   Migraines     Family History  Problem Relation Age of Onset   Osteoarthritis Mother    Osteoarthritis Brother  Hypertension Brother    Diabetes Brother    Osteoarthritis Maternal Grandmother    Arthritis Maternal Grandmother    Past Surgical History:  Procedure Laterality Date   ABDOMINAL HYSTERECTOMY     CESAREAN SECTION  2000   CYSTOSCOPY N/A 07/06/2016   Procedure: CYSTOSCOPY;  Surgeon: Nunzio Cobbs, MD;   Location: [facility];  Service: Gynecology;  Laterality: N/A;   ingrown toenail Left 07/28/2019   JOINT REPLACEMENT  L   Left hip replacement   LAPAROSCOPIC GASTRIC SLEEVE RESECTION N/A 03/21/2018   Procedure: LAPAROSCOPIC GASTRIC SLEEVE RESECTION, UPPER ENDO, ERAS Pathway;  Surgeon: [name], MD;  [facility];  Service: General;  Laterality: N/A;   OOPHORECTOMY  2007   Rt.oophorectomy    OVARIAN CYST SURGERY  ? and 10   rt removed,cyst off [place] ARTHROPLASTY  07/06/2011   Procedure: TOTAL HIP ARTHROPLASTY;  Surgeon: Kerin Salen, MD;  Location: [place];  Service: Orthopedics;  Laterality: Left;  DEPUY/PINNACLE   TOTAL HIP ARTHROPLASTY Right 11/21/2018   Procedure: RIGHT TOTAL HIP ARTHROPLASTY ANTERIOR APPROACH;  Surgeon: Frederik Pear, MD;  [facility];  Service: Orthopedics;  Laterality: Right;   TOTAL LAPAROSCOPIC HYSTERECTOMY WITH SALPINGECTOMY Left 07/06/2016   Procedure: HYSTERECTOMY TOTAL LAPAROSCOPIC WITH Left SALPINGECTOMY, Suture Left Side Wall Laceration;  Surgeon: Nunzio Cobbs, MD;  Location: [facility];  Service: Gynecology;  Laterality: Left;   Social History   Social History Narrative   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 in 2018      Spiritual Beliefs:      Lifestyle: starting healthy low sugar diet 08/2016/considering exercising at the [employer] History  Administered Date(s) Administered   Influenza-Unspecified 10/26/2013, 10/26/2016   PFIZER Comirnaty(Gray Top)Covid-19 Tri-Sucrose Vaccine 08/02/2020   PFIZER(Purple Top)SARS-COV-2 Vaccination 04/28/2019, 05/23/2019, 01/12/2020   Pneumococcal Conjugate-13 11/02/2016   Tdap 01/26/2009, 12/20/2018     Objective: Vital Signs: BP 113/78 (BP Location: Left Arm, Patient Position: Sitting, Cuff Size: Large)   Pulse 67   Ht $R'5\' 9"'gm$  (1.753 m)   Wt 254 lb (115.2 kg)   LMP 04/29/2016   BMI 37.51 kg/m    Physical Exam Vitals and nursing note reviewed.   Constitutional:      Appearance: She is well-developed.  HENT:     Head: Normocephalic and atraumatic.  Eyes:     Conjunctiva/sclera: Conjunctivae normal.  Cardiovascular:     Rate and Rhythm: Normal rate and regular rhythm.     Heart sounds: Normal heart sounds.  Pulmonary:     Effort: Pulmonary effort is normal.     Breath sounds: Normal breath sounds.  Abdominal:     General: Bowel sounds are normal.     Palpations: Abdomen is soft.  Musculoskeletal:     Cervical back: Normal range of motion.  Lymphadenopathy:     Cervical: No cervical adenopathy.  Skin:    General: Skin is warm and dry.     Capillary Refill: Capillary refill takes less than 2 seconds.  Neurological:     Mental Status: She is alert and oriented to person, place, and time.  Psychiatric:        Behavior: Behavior normal.     Musculoskeletal Exam: C-spine was in good range of motion.  Shoulder joints, elbow joints, wrist joints, MCPs PIPs and DIPs with good range of motion with no synovitis.  Hip joints, knee joints,  ankles, MTPs and PIPs with good range of motion with no synovitis.  She describes discomfort in the lateral aspect of her left knee.  No warmth swelling or effusion was noted.  CDAI Exam: CDAI Score: -- Patient Global: --; Provider Global: -- Swollen: --; Tender: -- Joint Exam 12/11/2020   No joint exam has been documented for this visit   There is currently no information documented on the homunculus. Go to the Rheumatology activity and complete the homunculus joint exam.  Investigation: No additional findings.  Imaging: No results found.  Recent Labs: Lab Results  Component Value Date   WBC 14.9 (H) 07/05/2020   HGB 13.8 07/05/2020   PLT 236 07/05/2020   NA 140 07/05/2020   K 3.7 07/05/2020   CL 105 07/05/2020   CO2 23 07/05/2020   GLUCOSE 151 (H) 07/05/2020   BUN 18 07/05/2020   CREATININE 0.80 07/05/2020   BILITOT 0.3 07/05/2020   ALKPHOS 49 05/11/2018   AST 15  07/05/2020   ALT 13 07/05/2020   PROT 7.7 07/05/2020   ALBUMIN 4.1 05/11/2018   CALCIUM 10.0 07/05/2020   GFRAA 101 07/05/2020    Speciality Comments: PLQ eye exam: 11/29/2020 WNL [employer]. Follow up in 1 year.   Procedures:  No procedures performed Allergies: Monosodium glutamate, Hydromorphone, Lactose intolerance (gi), Caffeine, Hydrocodone, and Oxycodone   Assessment / Plan:     Visit Diagnoses: Other systemic lupus erythematosus with other organ involvement ([place]) - History of positive ANA, DS DNA, elevated ESR, arthralgias, rash  -she has been clinically doing well.  She denies any history of oral ulcers, nasal ulcers, sicca symptoms, malar rash, photosensitivity or Raynaud's phenomenon.  She states she reduced her dose of hydroxychloroquine to 200 mg daily 3 weeks ago as she has been doing better.  I advised her to contact us in case she develops a flare.  Plan: Anti-DNA antibody, double-stranded, C3 and C4, Sedimentation rate, Urinalysis, Routine w reflex microscopic  High risk medication use - PLQ 200 mg po qd. PLQ eye exam: November 29, 2020- Plan: CBC with Differential/Platelet, COMPLETE METABOLIC PANEL WITH GFR today and then in 5 months.  Lateral epicondylitis, left elbow-she has off-and-on discomfort.  She works as a [name].  Nature conservation officer of both knees-she has been followed by Dr. Mayer Camel.  She states she had some cortisone injections and Visco supplement injections.  She states she was told that she will need future total knee replacement.  Status post bilateral total hip replacement-by Dr. Mayer Camel.  She is doing well without any discomfort.  Vitamin D deficiency -she been experiencing increased fatigue.  Per her request vitamin D level was ordered today.  Plan: VITAMIN D 25 Hydroxy (Vit-D Deficiency, Fractures)  Trapezius muscle spasm-she has been going for massage therapy which has been helpful.  History of migraine  Elevated hemoglobin A1c  -her hemoglobin A1c was elevated.  She has tried dietary modifications.  We will check the levels again today.  Plan: Hemoglobin A1c  Other fatigue -she has been experiencing increased fatigue.  I will check TSH today.  Plan: TSH  Orders: Orders Placed This Encounter  Procedures   CBC with Differential/Platelet   COMPLETE METABOLIC PANEL WITH GFR   Anti-DNA antibody, double-stranded   C3 and C4   Sedimentation rate   Urinalysis, Routine w reflex microscopic   TSH   VITAMIN D 25 Hydroxy (Vit-D Deficiency, Fractures)   Hemoglobin A1c    No orders of the defined types were placed in  this encounter.   Follow-Up Instructions: Return in about 5 months (around 05/11/2021) for Systemic lupus, Osteoarthritis.   Bo Merino, MD  Note - This record has been created using Editor, commissioning.  Chart creation errors have been sought, but may not always  have been located. Such creation errors do not reflect on  the standard of medical care.

## 2020-12-11 ENCOUNTER — Other Ambulatory Visit: Payer: Self-pay

## 2020-12-11 ENCOUNTER — Ambulatory Visit: Payer: 59 | Admitting: Rheumatology

## 2020-12-11 ENCOUNTER — Encounter: Payer: Self-pay | Admitting: Rheumatology

## 2020-12-11 VITALS — BP 113/78 | HR 67 | Ht 69.0 in | Wt 254.0 lb

## 2020-12-11 DIAGNOSIS — M62838 Other muscle spasm: Secondary | ICD-10-CM

## 2020-12-11 DIAGNOSIS — Z79899 Other long term (current) drug therapy: Secondary | ICD-10-CM | POA: Diagnosis not present

## 2020-12-11 DIAGNOSIS — Z8669 Personal history of other diseases of the nervous system and sense organs: Secondary | ICD-10-CM | POA: Diagnosis not present

## 2020-12-11 DIAGNOSIS — R7309 Other abnormal glucose: Secondary | ICD-10-CM

## 2020-12-11 DIAGNOSIS — Z96643 Presence of artificial hip joint, bilateral: Secondary | ICD-10-CM

## 2020-12-11 DIAGNOSIS — M7712 Lateral epicondylitis, left elbow: Secondary | ICD-10-CM | POA: Diagnosis not present

## 2020-12-11 DIAGNOSIS — E559 Vitamin D deficiency, unspecified: Secondary | ICD-10-CM

## 2020-12-11 DIAGNOSIS — M170 Bilateral primary osteoarthritis of knee: Secondary | ICD-10-CM

## 2020-12-11 DIAGNOSIS — M3219 Other organ or system involvement in systemic lupus erythematosus: Secondary | ICD-10-CM | POA: Diagnosis not present

## 2020-12-11 DIAGNOSIS — R5383 Other fatigue: Secondary | ICD-10-CM

## 2020-12-11 NOTE — Patient Instructions (Signed)
Vaccines You are taking a medication(s) that can suppress your immune system.  The following immunizations are recommended: Flu annually Covid-19  Td/Tdap (tetanus, diphtheria, pertussis) every 10 years Pneumonia (Prevnar 15 then Pneumovax 23 at least 1 year apart.  Alternatively, can take Prevnar 20 without needing additional dose) Shingrix: 2 doses from 4 weeks to 6 months apart  Please check with your PCP to make sure you are up to date..  Standing Labs We placed an order today for your standing lab work.   Please have your standing labs drawn in April  If possible, please have your labs drawn 2 weeks prior to your appointment so that the provider can discuss your results at your appointment.  Please note that you may see your imaging and lab results in [facility] before we have reviewed them. We may be awaiting multiple results to interpret others before contacting you. Please allow our office up to 72 hours to thoroughly review all of the results before contacting the office for clarification of your results.  We have open lab daily: Monday through Thursday from 1:30-4:30 PM and Friday from 1:30-4:00 PM at the office of Dr. Bo Merino, [facility].   Please be advised, all patients with office appointments requiring lab work will take precedent over walk-in lab work.  If possible, please come for your lab work on Monday and Friday afternoons, as you may experience shorter wait times. The office is located at [address] [place], [place], [place] [address] No appointment is necessary.   Labs are drawn by [facility]. Please bring your co-pay at the time of your lab draw.  You may receive a bill from [facility] for your lab work.  If you wish to have your labs drawn at another location, please call the office 24 hours in advance to send orders.  If you have any questions regarding directions or hours of operation,  please call [phone].   As a reminder, please  drink plenty of water prior to coming for your lab work. Thanks!

## 2020-12-12 LAB — COMPLETE METABOLIC PANEL WITH GFR
AG Ratio: 1.7 (calc) (ref 1.0–2.5)
ALT: 11 U/L (ref 6–29)
AST: 16 U/L (ref 10–35)
Albumin: 4.3 g/dL (ref 3.6–5.1)
Alkaline phosphatase (APISO): 43 U/L (ref 31–125)
BUN: 12 mg/dL (ref 7–25)
CO2: 26 mmol/L (ref 20–32)
Calcium: 9.4 mg/dL (ref 8.6–10.2)
Chloride: 104 mmol/L (ref 98–110)
Creat: 0.68 mg/dL (ref 0.50–0.99)
Globulin: 2.6 g/dL (calc) (ref 1.9–3.7)
Glucose, Bld: 85 mg/dL (ref 65–99)
Potassium: 4 mmol/L (ref 3.5–5.3)
Sodium: 138 mmol/L (ref 135–146)
Total Bilirubin: 0.4 mg/dL (ref 0.2–1.2)
Total Protein: 6.9 g/dL (ref 6.1–8.1)
eGFR: 107 mL/min/{1.73_m2} (ref >=60)

## 2020-12-12 LAB — CBC WITH DIFFERENTIAL/PLATELET
Absolute Monocytes: 621 cells/uL (ref 200–950)
Basophils Absolute: 22 cells/uL (ref 0–200)
Basophils Relative: 0.3 %
Eosinophils Absolute: 58 cells/uL (ref 15–500)
Eosinophils Relative: 0.8 %
HCT: 42.5 % (ref 35.0–45.0)
Hemoglobin: 13.6 g/dL (ref 11.7–15.5)
Lymphs Abs: 2029 cells/uL (ref 850–3900)
MCH: 28.3 pg (ref 27.0–33.0)
MCHC: 32 g/dL (ref 32.0–36.0)
MCV: 88.5 fL (ref 80.0–100.0)
MPV: 10.6 fL (ref 7.5–12.5)
Monocytes Relative: 8.5 %
Neutro Abs: 4570 cells/uL (ref 1500–7800)
Neutrophils Relative %: 62.6 %
Platelets: 235 10*3/uL (ref 140–400)
RBC: 4.8 10*6/uL (ref 3.80–5.10)
RDW: 11.9 % (ref 11.0–15.0)
Total Lymphocyte: 27.8 %
WBC: 7.3 10*3/uL (ref 3.8–10.8)

## 2020-12-12 LAB — HEMOGLOBIN A1C
Hgb A1c MFr Bld: 5.3 % of total Hgb (ref <5.7)
Mean Plasma Glucose: 105 mg/dL
eAG (mmol/L): 5.8 mmol/L

## 2020-12-12 LAB — ANTI-DNA ANTIBODY, DOUBLE-STRANDED: ds DNA Ab: 17 IU/mL — ABNORMAL HIGH

## 2020-12-12 LAB — URINALYSIS, ROUTINE W REFLEX MICROSCOPIC
Bilirubin Urine: NEGATIVE
Glucose, UA: NEGATIVE
Hgb urine dipstick: NEGATIVE
Ketones, ur: NEGATIVE
Leukocytes,Ua: NEGATIVE
Nitrite: NEGATIVE
Protein, ur: NEGATIVE
Specific Gravity, Urine: 1.022 (ref 1.001–1.035)
pH: 5.5 (ref 5.0–8.0)

## 2020-12-12 LAB — C3 AND C4
C3 Complement: 112 mg/dL (ref 83–193)
C4 Complement: 41 mg/dL (ref 15–57)

## 2020-12-12 LAB — VITAMIN D 25 HYDROXY (VIT D DEFICIENCY, FRACTURES): Vit D, 25-Hydroxy: 54 ng/mL (ref 30–100)

## 2020-12-12 LAB — SEDIMENTATION RATE: Sed Rate: 9 mm/h (ref 0–20)

## 2020-12-12 LAB — TSH: TSH: 1.59 mIU/L

## 2020-12-12 NOTE — Progress Notes (Signed)
CBC and CMP are normal.  Double-stranded DNA is positive and stable.  Complements normal.  Sedimentation rate is normal.  UA is negative.  Hemoglobin A1c is 5.3.  Vitamin D is 54.  TSH is normal.  No change in treatment advised.

## 2020-12-13 ENCOUNTER — Other Ambulatory Visit (HOSPITAL_BASED_OUTPATIENT_CLINIC_OR_DEPARTMENT_OTHER): Payer: Self-pay

## 2020-12-18 ENCOUNTER — Other Ambulatory Visit (HOSPITAL_BASED_OUTPATIENT_CLINIC_OR_DEPARTMENT_OTHER): Payer: Self-pay

## 2020-12-23 ENCOUNTER — Other Ambulatory Visit (HOSPITAL_BASED_OUTPATIENT_CLINIC_OR_DEPARTMENT_OTHER): Payer: Self-pay

## 2020-12-24 ENCOUNTER — Other Ambulatory Visit (HOSPITAL_BASED_OUTPATIENT_CLINIC_OR_DEPARTMENT_OTHER): Payer: Self-pay

## 2020-12-24 MED ORDER — PHENTERMINE HCL 37.5 MG PO CAPS
ORAL_CAPSULE | ORAL | 0 refills | Status: DC
  Filled 2020-12-24: qty 30, 30d supply, fill #0

## 2021-01-28 ENCOUNTER — Other Ambulatory Visit (HOSPITAL_BASED_OUTPATIENT_CLINIC_OR_DEPARTMENT_OTHER): Payer: Self-pay

## 2021-01-28 MED ORDER — PHENTERMINE HCL 37.5 MG PO TABS
ORAL_TABLET | ORAL | 0 refills | Status: DC
  Filled 2021-01-28: qty 30, 30d supply, fill #0

## 2021-01-30 DIAGNOSIS — Z9884 Bariatric surgery status: Secondary | ICD-10-CM | POA: Diagnosis not present

## 2021-02-11 ENCOUNTER — Other Ambulatory Visit (HOSPITAL_BASED_OUTPATIENT_CLINIC_OR_DEPARTMENT_OTHER): Payer: Self-pay

## 2021-02-11 DIAGNOSIS — M1711 Unilateral primary osteoarthritis, right knee: Secondary | ICD-10-CM | POA: Diagnosis not present

## 2021-02-11 DIAGNOSIS — M1712 Unilateral primary osteoarthritis, left knee: Secondary | ICD-10-CM | POA: Diagnosis not present

## 2021-02-11 MED ORDER — TRAMADOL HCL 50 MG PO TABS
ORAL_TABLET | ORAL | 2 refills | Status: DC
  Filled 2021-02-11: qty 90, 30d supply, fill #0
  Filled 2021-04-10: qty 90, 30d supply, fill #1
  Filled 2021-05-20: qty 90, 30d supply, fill #2

## 2021-02-13 ENCOUNTER — Ambulatory Visit: Payer: 59 | Admitting: Family Medicine

## 2021-03-04 ENCOUNTER — Other Ambulatory Visit (HOSPITAL_BASED_OUTPATIENT_CLINIC_OR_DEPARTMENT_OTHER): Payer: Self-pay

## 2021-03-06 ENCOUNTER — Other Ambulatory Visit (HOSPITAL_BASED_OUTPATIENT_CLINIC_OR_DEPARTMENT_OTHER): Payer: Self-pay

## 2021-03-06 ENCOUNTER — Ambulatory Visit (INDEPENDENT_AMBULATORY_CARE_PROVIDER_SITE_OTHER): Payer: 59 | Admitting: Family Medicine

## 2021-03-06 ENCOUNTER — Encounter: Payer: Self-pay | Admitting: Family Medicine

## 2021-03-06 VITALS — BP 138/80 | HR 76 | Temp 97.5°F | Ht 69.0 in | Wt 242.2 lb

## 2021-03-06 DIAGNOSIS — Z1211 Encounter for screening for malignant neoplasm of colon: Secondary | ICD-10-CM | POA: Diagnosis not present

## 2021-03-06 DIAGNOSIS — Z0000 Encounter for general adult medical examination without abnormal findings: Secondary | ICD-10-CM

## 2021-03-06 DIAGNOSIS — Z8669 Personal history of other diseases of the nervous system and sense organs: Secondary | ICD-10-CM

## 2021-03-06 MED ORDER — DICLOFENAC SODIUM 50 MG PO TBEC
50.0000 mg | DELAYED_RELEASE_TABLET | ORAL | 0 refills | Status: DC | PRN
  Filled 2021-03-06: qty 20, 20d supply, fill #0

## 2021-03-06 MED ORDER — PHENTERMINE HCL 37.5 MG PO CAPS
ORAL_CAPSULE | ORAL | 0 refills | Status: DC
  Filled 2021-03-06: qty 30, 30d supply, fill #0

## 2021-03-06 NOTE — Progress Notes (Signed)
Subjective:     Julia Monroe is a 51 y.o. female and is here for a comprehensive physical exam.  Pt states she is doing well.  Has lost another 25 lbs since starting phentermine in Sept/Oct 2022.  Had steroid injections in L knee and L elbow which caused a transient elevation in her blood sugar.  Patient followed by rheumatology for history of SLE.  Can have labs done there or at work.  Also followed by OB/GYN.  Pap done late 2022.  Patient requesting refill on diclofenac 50 mg tablets for migraines.  Patient endorses rarely having a migraine.  In the past took [place] which had diclofenac in it, but it was expensive.  Patient notes caffeine will cause migraines if ingested hot but not when cold such as a soda.    Social History   Socioeconomic History   Marital status: Single    Spouse name: Not on file   Number of children: Not on file   Years of education: Not on file   Highest education level: Not on file  Occupational History   Not on file  Tobacco Use   Smoking status: Never   Smokeless tobacco: Never  Vaping Use   Vaping Use: Never used  Substance and Sexual Activity   Alcohol use: No    Alcohol/week: 0.0 standard drinks   Drug use: No   Sexual activity: Yes    Partners: Male    Birth control/protection: Surgical    Comment: Hyst  Other Topics Concern   Not on file  Social History Narrative   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[employer]      Home Situation: 53 yo daughter in 2018      Spiritual Beliefs:      Lifestyle: starting healthy low sugar diet 08/2016/considering exercising at the [facility]   Financial Resource Strain: Not on file  Food Insecurity: Not on file  Transportation Needs: Not on file  Physical Activity: Not on file  Stress: Not on file  Social Connections: Not on file  Intimate Partner Violence: Not on file   Health Maintenance  Topic Date Due   COLONOSCOPY (Pts 45-46yrs Insurance coverage will need to be confirmed)   Never done   INFLUENZA VACCINE  08/26/2020   COVID-19 Vaccine (5 - Booster for [place] series) 09/27/2020   PAP SMEAR-Modifier  01/25/2022   TETANUS/TDAP  12/19/2028   Hepatitis C Screening  Completed   HIV Screening  Completed   HPV VACCINES  Aged Out    The following portions of the patient's history were reviewed and updated as appropriate: allergies, current medications, past family history, past medical history, past social history, past surgical history, and problem list.  Review of Systems Pertinent items noted in HPI and remainder of comprehensive ROS otherwise negative.   Objective:    BP 138/80 (BP Location: Left Arm, Patient Position: Sitting, Cuff Size: Normal)    Pulse 76    Temp (!) 97.5 F (36.4 C) (Oral)    Ht 5\' 9"  (1.753 m)    Wt 242 lb 3.2 oz (109.9 kg)    LMP 04/29/2016    SpO2 98%    BMI 35.77 kg/m  General appearance: alert, cooperative, and no distress Head: Normocephalic, without obvious abnormality, atraumatic Eyes: conjunctivae/corneas clear. PERRL, EOM's intact. Fundi benign. Ears: normal TM's and external ear canals both ears Nose: Nares normal. Septum midline. Mucosa normal. No drainage or sinus tenderness. Throat: lips, mucosa, and tongue  normal; teeth and gums normal Neck: no adenopathy, no carotid bruit, no JVD, supple, symmetrical, trachea midline, and thyroid not enlarged, symmetric, no tenderness/mass/nodules Lungs: clear to auscultation bilaterally Heart: regular rate and rhythm, S1, S2 normal, no murmur, click, rub or gallop Abdomen: soft, non-tender; bowel sounds normal; no masses,  no organomegaly Extremities: extremities normal, atraumatic, no cyanosis or edema Pulses: 2+ and symmetric Skin: Skin color, texture, turgor normal. No rashes or lesions Lymph nodes: Cervical, supraclavicular, and axillary nodes normal. Neurologic: Alert and oriented X 3, normal strength and tone. Normal symmetric reflexes. Normal coordination and gait     Assessment:    Healthy female exam.      Plan:    Anticipatory guidance given including wearing seatbelts, smoke detectors in the home, increasing physical activity, increasing p.o. intake of water and vegetables. -Patient will have lipids and other labs at work or with rheumatology.  Recent labs from 12/11/2020 reviewed. -mammogram done 03/18/2020.  Pt plans to schedule yearly follow-up this month. -Colonoscopy due.  Discussed options.  Patient wishes to do Cologuard. -Immunizations reviewed. -Neck CPE in 1 year See After Visit Summary for Counseling Recommendations   Screen for colon cancer  - Plan: Cologuard  History of migraine -Stable -Discussed migraine prevention including limiting caffeine, getting plenty of rest, hydration. -Also consider magnesium supplement -Plan: Diclofenac 50 mg  Follow-up as needed  Grier Mitts, MD

## 2021-03-17 ENCOUNTER — Other Ambulatory Visit (HOSPITAL_BASED_OUTPATIENT_CLINIC_OR_DEPARTMENT_OTHER): Payer: Self-pay

## 2021-03-18 DIAGNOSIS — M1712 Unilateral primary osteoarthritis, left knee: Secondary | ICD-10-CM | POA: Diagnosis not present

## 2021-03-24 ENCOUNTER — Other Ambulatory Visit (HOSPITAL_BASED_OUTPATIENT_CLINIC_OR_DEPARTMENT_OTHER): Payer: Self-pay

## 2021-04-10 ENCOUNTER — Other Ambulatory Visit (HOSPITAL_BASED_OUTPATIENT_CLINIC_OR_DEPARTMENT_OTHER): Payer: Self-pay

## 2021-04-11 ENCOUNTER — Other Ambulatory Visit (HOSPITAL_BASED_OUTPATIENT_CLINIC_OR_DEPARTMENT_OTHER): Payer: Self-pay

## 2021-04-11 MED ORDER — PHENTERMINE HCL 37.5 MG PO CAPS
ORAL_CAPSULE | ORAL | 0 refills | Status: DC
  Filled 2021-04-11: qty 30, 30d supply, fill #0

## 2021-04-30 NOTE — Progress Notes (Signed)
? ?Office Visit Note ? ?Patient: Julia Monroe             ?Date of Birth: 10/28/1970           ?MRN: [id number]             ?PCP: Billie Ruddy, MD ?Referring: Billie Ruddy, MD ?Visit Date: 05/14/2021 ?Occupation: '@GUAROCC'$ @ ? ?Subjective:  ?Medication monitoring ? ?History of Present Illness: Julia Monroe is a 51 y.o. female with history of systemic lupus and osteoarthritis.  She returns today after her last visit in November 2022.  She states she decided to come off hydroxychloroquine as she did not like taking the medication twice a day and also she felt that it was not making any difference in her health.  She has not noticed any worsening of her symptoms.  She is willing to take hydroxychloroquine once a day for disease prevention.  She denies any history of fatigue, oral ulcers, nasal ulcers, malar rash, photosensitivity, Raynaud's, lymphadenopathy or inflammatory arthritis.  She continues to have some discomfort in her bilateral knee joints and trapezius region. ? ?Activities of Daily Living:  ?Patient reports morning stiffness for a few minutes.   ?Patient Reports nocturnal pain.  ?Difficulty dressing/grooming: Denies ?Difficulty climbing stairs: Denies ?Difficulty getting out of chair: Denies ?Difficulty using hands for taps, buttons, cutlery, and/or writing: Denies ? ?Review of Systems  ?Constitutional:  Negative for fatigue.  ?HENT:  Negative for mouth sores, mouth dryness and nose dryness.   ?Eyes:  Negative for pain, itching and dryness.  ?Respiratory:  Negative for shortness of breath and difficulty breathing.   ?Cardiovascular:  Negative for chest pain and palpitations.  ?Gastrointestinal:  Negative for blood in stool, constipation and diarrhea.  ?Endocrine: Negative for increased urination.  ?Genitourinary:  Negative for difficulty urinating.  ?Musculoskeletal:  Positive for joint pain, joint pain and morning stiffness. Negative for joint swelling, myalgias, muscle tenderness  and myalgias.  ?Skin:  Negative for color change, rash and redness.  ?Allergic/Immunologic: Negative for susceptible to infections.  ?Neurological:  Positive for numbness. Negative for dizziness, headaches, memory loss and weakness.  ?Hematological:  Negative for bruising/bleeding tendency.  ?Psychiatric/Behavioral:  Negative for confusion.   ? ?PMFS History:  ?Patient Active Problem List  ? Diagnosis Date Noted  ? History of total hip arthroplasty, right 11/21/2018  ? Prediabetes 03/21/2018  ? SLE (systemic lupus erythematosus) ([place]) 03/21/2018  ? Obesity 03/21/2018  ? History of migraine 08/27/2017  ? Class 3 severe obesity due to excess calories without serious comorbidity with body mass index (BMI) of 50.0 to 59.9 in adult [facility]) 01/27/2017  ? Status post bilateral total hip replacement 10/27/2016  ? Hyperglycemia 08/25/2016  ? Leukocytosis 08/13/2016  ? Status post laparoscopic hysterectomy 07/06/2016  ? Primary osteoarthritis of both knees 04/29/2016  ? ANA positive 04/29/2016  ? Primary osteoarthritis of right hip 07/10/2011  ?  ?Past Medical History:  ?Diagnosis Date  ? Arthritis   ? osteoarthritis  ? Complication of anesthesia   ? bottom of feet itching  ? Elevated hemoglobin A1c 2018  ? 6.1  ? Herpes simplex type II infection 2020  ? Lupus ([place]) 2018  ? Dr.Ousman Dise, currently in normal range  ? Migraines   ?  ?Family History  ?Problem Relation Age of Onset  ? Osteoarthritis Mother   ? Osteoarthritis Brother   ? Hypertension Brother   ? Diabetes Brother   ? Osteoarthritis Maternal Grandmother   ? Arthritis Maternal Grandmother   ? ?  Past Surgical History:  ?Procedure Laterality Date  ? ABDOMINAL HYSTERECTOMY    ? CESAREAN SECTION  2000  ? CYSTOSCOPY N/A 07/06/2016  ? Procedure: CYSTOSCOPY;  Surgeon: Nunzio Cobbs, MD;  Location: [facility];  Service: Gynecology;  Laterality: N/A;  ? ingrown toenail Left 07/28/2019  ? JOINT REPLACEMENT  L  ? Left hip replacement  ? LAPAROSCOPIC GASTRIC SLEEVE RESECTION  N/A 03/21/2018  ? Procedure: LAPAROSCOPIC GASTRIC SLEEVE RESECTION, UPPER ENDO, ERAS Pathway;  Surgeon: [name], MD;  Location: [facility];  Service: General;  Laterality: N/A;  ? OOPHORECTOMY  2007  ? Rt.oophorectomy   ? OVARIAN CYST SURGERY  ? and 10  ? rt removed,cyst off lft  ? TOTAL HIP ARTHROPLASTY  07/06/2011  ? Procedure: TOTAL HIP ARTHROPLASTY;  Surgeon: Kerin Salen, MD;  Location: [facility];  Service: Orthopedics;  Laterality: Left;  DEPUY/PINNACLE  ? TOTAL HIP ARTHROPLASTY Right 11/21/2018  ? Procedure: RIGHT TOTAL HIP ARTHROPLASTY ANTERIOR APPROACH;  Surgeon: Frederik Pear, MD;  Location: [facility];  Service: Orthopedics;  Laterality: Right;  ? TOTAL LAPAROSCOPIC HYSTERECTOMY WITH SALPINGECTOMY Left 07/06/2016  ? Procedure: HYSTERECTOMY TOTAL LAPAROSCOPIC WITH Left SALPINGECTOMY, Suture Left Side Wall Laceration;  Surgeon: Nunzio Cobbs, MD;  Location: [facility];  Service: Gynecology;  Laterality: Left;  ? ?Social History  ? ?Social History Narrative  ? Work or School: [name] - [employer]  ?   ? Home Situation: 55 yo daughter in 2018  ?   ? Spiritual Beliefs:  ?   ? Lifestyle: starting healthy low sugar diet 08/2016[facility]  ? ?Immunization History  ?Administered Date(s) Administered  ? Influenza-Unspecified 10/26/2013, 10/26/2016  ? PFIZER Comirnaty(Gray Top)Covid-19 Tri-Sucrose Vaccine 08/02/2020  ? PFIZER(Purple Top)SARS-COV-2 Vaccination 04/28/2019, 05/23/2019, 01/12/2020  ? Pneumococcal Conjugate-13 11/02/2016  ? Tdap 01/26/2009, 12/20/2018  ?  ? ?Objective: ?Vital Signs: BP 120/81 (BP Location: Left Arm, Patient Position: Sitting, Cuff Size: Large)   Pulse 76   Ht '5\' 10"'$  (1.778 m)   Wt 237 lb (107.5 kg)   LMP 04/29/2016   BMI 34.01 kg/m?   ? ?Physical Exam ?Vitals and nursing note reviewed.  ?Constitutional:   ?   Appearance: She is well-developed.  ?HENT:  ?   Head: Normocephalic and atraumatic.  ?Eyes:  ?   Conjunctiva/sclera: Conjunctivae normal.  ?Cardiovascular:  ?    Rate and Rhythm: Normal rate and regular rhythm.  ?   Heart sounds: Normal heart sounds.  ?Pulmonary:  ?   Effort: Pulmonary effort is normal.  ?   Breath sounds: Normal breath sounds.  ?Abdominal:  ?   General: Bowel sounds are normal.  ?   Palpations: Abdomen is soft.  ?Musculoskeletal:  ?   Cervical back: Normal range of motion.  ?Lymphadenopathy:  ?   Cervical: No cervical adenopathy.  ?Skin: ?   General: Skin is warm and dry.  ?   Capillary Refill: Capillary refill takes less than 2 seconds.  ?Neurological:  ?   Mental Status: She is alert and oriented to person, place, and time.  ?Psychiatric:     ?   Behavior: Behavior normal.  ?  ? ?Musculoskeletal Exam: C-spine was in good range of motion.  Shoulder joints, elbow joints, wrist joints, MCPs PIPs and DIPs with good range of motion with no synovitis.  Hip joints, knee joints, ankles, MTPs and PIPs with good range of motion with no synovitis. ? ?CDAI Exam: ?CDAI Score: -- ?Patient Global: --; Provider  Global: -- ?Swollen: --; Tender: -- ?Joint Exam 05/14/2021  ? ?No joint exam has been documented for this visit  ? ?There is currently no information documented on the homunculus. Go to the Rheumatology activity and complete the homunculus joint exam. ? ?Investigation: ?No additional findings. ? ?Imaging: ?No results found. ? ?Recent Labs: ?Lab Results  ?Component Value Date  ? WBC 7.1 05/05/2021  ? HGB 14.0 05/05/2021  ? PLT 209 05/05/2021  ? NA 139 05/05/2021  ? K 4.4 05/05/2021  ? CL 105 05/05/2021  ? CO2 26 05/05/2021  ? GLUCOSE 68 05/05/2021  ? BUN 16 05/05/2021  ? CREATININE 0.59 05/05/2021  ? BILITOT 0.5 05/05/2021  ? ALKPHOS 49 05/11/2018  ? AST 16 05/05/2021  ? ALT 11 05/05/2021  ? PROT 7.2 05/05/2021  ? ALBUMIN 4.1 05/11/2018  ? CALCIUM 9.3 05/05/2021  ? GFRAA 101 07/05/2020  ? ? ? ?Speciality Comments: PLQ eye exam: 11/29/2020 [facility] [employer]. Follow up in 1 year. ? ? ? ?Procedures:  ?No procedures performed ?Allergies: Monosodium  glutamate, Hydromorphone, Lactose intolerance (gi), Caffeine, Hydrocodone, and Oxycodone  ? ?Assessment / Plan:     ?Visit Diagnoses: Other systemic lupus erythematosus with other organ involvement ([place]) - History

## 2021-05-05 ENCOUNTER — Other Ambulatory Visit: Payer: Self-pay | Admitting: *Deleted

## 2021-05-05 ENCOUNTER — Telehealth: Payer: Self-pay | Admitting: Rheumatology

## 2021-05-05 DIAGNOSIS — M3219 Other organ or system involvement in systemic lupus erythematosus: Secondary | ICD-10-CM | POA: Diagnosis not present

## 2021-05-05 DIAGNOSIS — Z79899 Other long term (current) drug therapy: Secondary | ICD-10-CM

## 2021-05-05 DIAGNOSIS — E559 Vitamin D deficiency, unspecified: Secondary | ICD-10-CM

## 2021-05-05 DIAGNOSIS — R7309 Other abnormal glucose: Secondary | ICD-10-CM

## 2021-05-05 DIAGNOSIS — R5383 Other fatigue: Secondary | ICD-10-CM

## 2021-05-05 NOTE — Telephone Encounter (Signed)
OK to add lab? ?

## 2021-05-05 NOTE — Telephone Encounter (Signed)
Patient called the office stating she is coming to get labs done today and wants to add a lipid panel because she's fasting. Patient states she's gotten that lab here before.  ?

## 2021-05-05 NOTE — Telephone Encounter (Signed)
Ok to add lipid panel.

## 2021-05-06 LAB — URINALYSIS, ROUTINE W REFLEX MICROSCOPIC
Bilirubin Urine: NEGATIVE
Glucose, UA: NEGATIVE
Hgb urine dipstick: NEGATIVE
Ketones, ur: NEGATIVE
Leukocytes,Ua: NEGATIVE
Nitrite: NEGATIVE
Protein, ur: NEGATIVE
Specific Gravity, Urine: 1.029 (ref 1.001–1.035)
pH: <=5 (ref 5.0–8.0)

## 2021-05-06 LAB — CBC WITH DIFFERENTIAL/PLATELET
Absolute Monocytes: 575 cells/uL (ref 200–950)
Basophils Absolute: 28 cells/uL (ref 0–200)
Basophils Relative: 0.4 %
Eosinophils Absolute: 28 cells/uL (ref 15–500)
Eosinophils Relative: 0.4 %
HCT: 42.9 % (ref 35.0–45.0)
Hemoglobin: 14 g/dL (ref 11.7–15.5)
Lymphs Abs: 2073 cells/uL (ref 850–3900)
MCH: 29.4 pg (ref 27.0–33.0)
MCHC: 32.6 g/dL (ref 32.0–36.0)
MCV: 89.9 fL (ref 80.0–100.0)
MPV: 11.3 fL (ref 7.5–12.5)
Monocytes Relative: 8.1 %
Neutro Abs: 4395 cells/uL (ref 1500–7800)
Neutrophils Relative %: 61.9 %
Platelets: 209 10*3/uL (ref 140–400)
RBC: 4.77 10*6/uL (ref 3.80–5.10)
RDW: 12.2 % (ref 11.0–15.0)
Total Lymphocyte: 29.2 %
WBC: 7.1 10*3/uL (ref 3.8–10.8)

## 2021-05-06 LAB — COMPLETE METABOLIC PANEL WITH GFR
AG Ratio: 1.4 (calc) (ref 1.0–2.5)
ALT: 11 U/L (ref 6–29)
AST: 16 U/L (ref 10–35)
Albumin: 4.2 g/dL (ref 3.6–5.1)
Alkaline phosphatase (APISO): 44 U/L (ref 31–125)
BUN: 16 mg/dL (ref 7–25)
CO2: 26 mmol/L (ref 20–32)
Calcium: 9.3 mg/dL (ref 8.6–10.2)
Chloride: 105 mmol/L (ref 98–110)
Creat: 0.59 mg/dL (ref 0.50–0.99)
Globulin: 3 g/dL (calc) (ref 1.9–3.7)
Glucose, Bld: 68 mg/dL (ref 65–99)
Potassium: 4.4 mmol/L (ref 3.5–5.3)
Sodium: 139 mmol/L (ref 135–146)
Total Bilirubin: 0.5 mg/dL (ref 0.2–1.2)
Total Protein: 7.2 g/dL (ref 6.1–8.1)
eGFR: 110 mL/min/{1.73_m2} (ref >=60)

## 2021-05-06 LAB — LIPID PANEL
Cholesterol: 172 mg/dL (ref <200)
HDL: 60 mg/dL (ref >=50)
LDL Cholesterol (Calc): 101 mg/dL (calc) — ABNORMAL HIGH
Non-HDL Cholesterol (Calc): 112 mg/dL (calc) (ref <130)
Total CHOL/HDL Ratio: 2.9 (calc) (ref <5.0)
Triglycerides: 38 mg/dL (ref <150)

## 2021-05-06 LAB — C3 AND C4
C3 Complement: 111 mg/dL (ref 83–193)
C4 Complement: 45 mg/dL (ref 15–57)

## 2021-05-06 LAB — SEDIMENTATION RATE: Sed Rate: 11 mm/h (ref 0–20)

## 2021-05-06 LAB — ANTI-DNA ANTIBODY, DOUBLE-STRANDED: ds DNA Ab: 15 IU/mL — ABNORMAL HIGH

## 2021-05-07 NOTE — Progress Notes (Signed)
I will discuss results at the follow-up visit.

## 2021-05-14 ENCOUNTER — Ambulatory Visit: Payer: 59 | Admitting: Rheumatology

## 2021-05-14 ENCOUNTER — Encounter: Payer: Self-pay | Admitting: Rheumatology

## 2021-05-14 VITALS — BP 120/81 | HR 76 | Ht 70.0 in | Wt 237.0 lb

## 2021-05-14 DIAGNOSIS — R5383 Other fatigue: Secondary | ICD-10-CM

## 2021-05-14 DIAGNOSIS — E559 Vitamin D deficiency, unspecified: Secondary | ICD-10-CM | POA: Diagnosis not present

## 2021-05-14 DIAGNOSIS — Z79899 Other long term (current) drug therapy: Secondary | ICD-10-CM

## 2021-05-14 DIAGNOSIS — R7309 Other abnormal glucose: Secondary | ICD-10-CM

## 2021-05-14 DIAGNOSIS — M170 Bilateral primary osteoarthritis of knee: Secondary | ICD-10-CM

## 2021-05-14 DIAGNOSIS — M7712 Lateral epicondylitis, left elbow: Secondary | ICD-10-CM

## 2021-05-14 DIAGNOSIS — Z96643 Presence of artificial hip joint, bilateral: Secondary | ICD-10-CM | POA: Diagnosis not present

## 2021-05-14 DIAGNOSIS — M62838 Other muscle spasm: Secondary | ICD-10-CM

## 2021-05-14 DIAGNOSIS — M3219 Other organ or system involvement in systemic lupus erythematosus: Secondary | ICD-10-CM | POA: Diagnosis not present

## 2021-05-14 DIAGNOSIS — Z8669 Personal history of other diseases of the nervous system and sense organs: Secondary | ICD-10-CM | POA: Diagnosis not present

## 2021-05-20 ENCOUNTER — Other Ambulatory Visit (HOSPITAL_BASED_OUTPATIENT_CLINIC_OR_DEPARTMENT_OTHER): Payer: Self-pay

## 2021-05-30 ENCOUNTER — Other Ambulatory Visit (HOSPITAL_BASED_OUTPATIENT_CLINIC_OR_DEPARTMENT_OTHER): Payer: Self-pay

## 2021-05-30 DIAGNOSIS — Z87898 Personal history of other specified conditions: Secondary | ICD-10-CM | POA: Diagnosis not present

## 2021-05-30 DIAGNOSIS — Z9884 Bariatric surgery status: Secondary | ICD-10-CM | POA: Diagnosis not present

## 2021-05-30 MED ORDER — PHENTERMINE HCL 37.5 MG PO TABS
ORAL_TABLET | ORAL | 0 refills | Status: DC
  Filled 2021-05-30: qty 10, 10d supply, fill #0
  Filled 2021-05-30: qty 20, 20d supply, fill #0

## 2021-06-12 ENCOUNTER — Other Ambulatory Visit (HOSPITAL_BASED_OUTPATIENT_CLINIC_OR_DEPARTMENT_OTHER): Payer: Self-pay

## 2021-06-16 ENCOUNTER — Other Ambulatory Visit (HOSPITAL_BASED_OUTPATIENT_CLINIC_OR_DEPARTMENT_OTHER): Payer: Self-pay

## 2021-06-17 DIAGNOSIS — M1712 Unilateral primary osteoarthritis, left knee: Secondary | ICD-10-CM | POA: Diagnosis not present

## 2021-07-03 ENCOUNTER — Other Ambulatory Visit (HOSPITAL_BASED_OUTPATIENT_CLINIC_OR_DEPARTMENT_OTHER): Payer: Self-pay

## 2021-07-03 MED ORDER — PHENTERMINE HCL 37.5 MG PO CAPS
ORAL_CAPSULE | ORAL | 0 refills | Status: DC
  Filled 2021-07-03: qty 30, 30d supply, fill #0

## 2021-08-01 ENCOUNTER — Other Ambulatory Visit (HOSPITAL_BASED_OUTPATIENT_CLINIC_OR_DEPARTMENT_OTHER): Payer: Self-pay

## 2021-08-05 ENCOUNTER — Telehealth: Payer: Self-pay

## 2021-08-05 NOTE — Telephone Encounter (Signed)
Caller states her foot is swollen, not bad but it is swollen. Caller states most days she has on her compression socks and not sure if its from that and just want to get some advice as she was transferred to speak with the nurse. ---Caller states that she developed increased swelling of her left foot and ankle about yesterday. No fever. No signs of infection. Alert and responsive.  08/05/2021 10:16:02 AM See HCP within 4 Hours (or PCP triage) [place]  Comments User: Berton Mount, RN Date/Time [name] Time): 08/05/2021 10:18:06 AM Julia Monroe states she has an appointment scheduled for tomorrow that she plans to keep. She declined to check on appointment options within the next four hours. Encouraged to call back as needed.  Referrals GO TO FACILITY REFUSED  Pt has appt with PCP on 08/06/21

## 2021-08-06 ENCOUNTER — Ambulatory Visit: Payer: 59 | Admitting: Family Medicine

## 2021-08-07 ENCOUNTER — Other Ambulatory Visit (HOSPITAL_BASED_OUTPATIENT_CLINIC_OR_DEPARTMENT_OTHER): Payer: Self-pay

## 2021-08-07 MED ORDER — TRAMADOL HCL 50 MG PO TABS
ORAL_TABLET | ORAL | 2 refills | Status: DC
  Filled 2021-08-07: qty 90, 30d supply, fill #0
  Filled 2021-10-14: qty 2, 1d supply, fill #1
  Filled 2021-10-14: qty 88, 29d supply, fill #1
  Filled 2022-01-05: qty 90, 30d supply, fill #2

## 2021-08-08 ENCOUNTER — Other Ambulatory Visit (HOSPITAL_BASED_OUTPATIENT_CLINIC_OR_DEPARTMENT_OTHER): Payer: Self-pay

## 2021-08-08 MED ORDER — PHENTERMINE HCL 37.5 MG PO CAPS
ORAL_CAPSULE | ORAL | 0 refills | Status: DC
  Filled 2021-08-08: qty 30, 30d supply, fill #0

## 2021-09-04 IMAGING — RF DG HIP (WITH PELVIS) OPERATIVE*R*
1 series · 3 of 3 positions shown · non-contrast
Comparison: 07/06/2011

CLINICAL DATA: RIGHT hip replacement

EXAM:
OPERATIVE RIGHT HIP (WITH PELVIS IF PERFORMED) 3 VIEWS
TECHNIQUE: Fluoroscopic spot image(s) were submitted for interpretation
post-operatively.
FLUOROSCOPY TIME:  0 minutes 19 seconds

[Series 1: unknown protocol · 0.20mm/px · 3 of 3 slices shown]
[im 1/3]
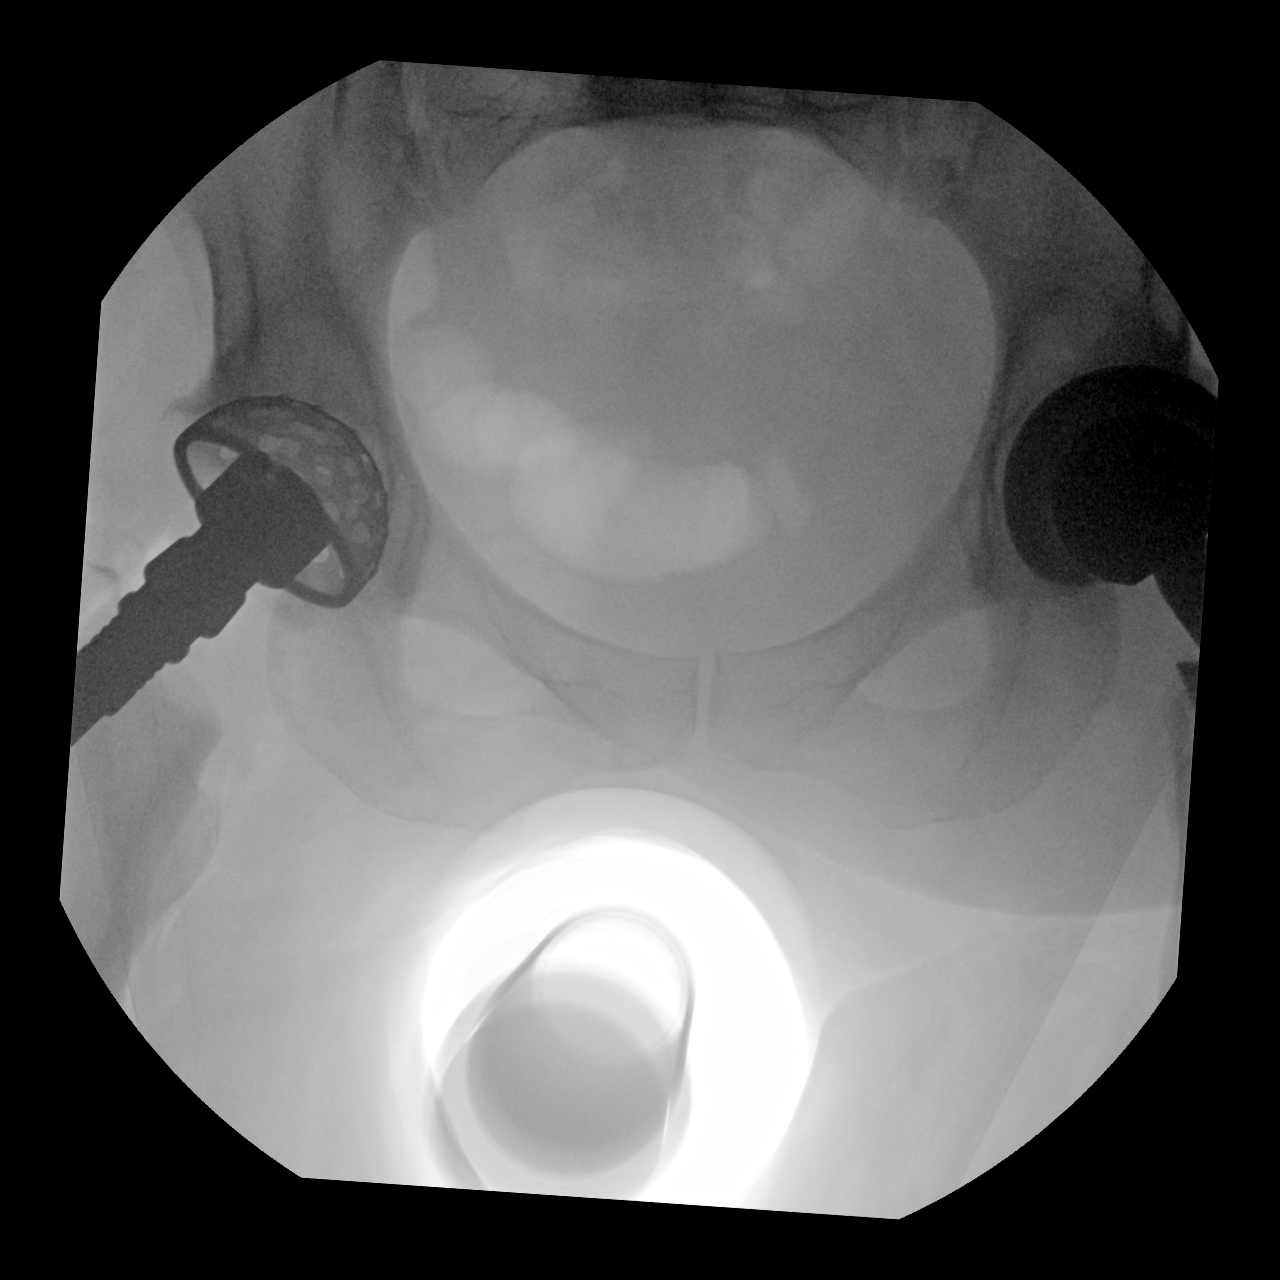
[im 2/3]
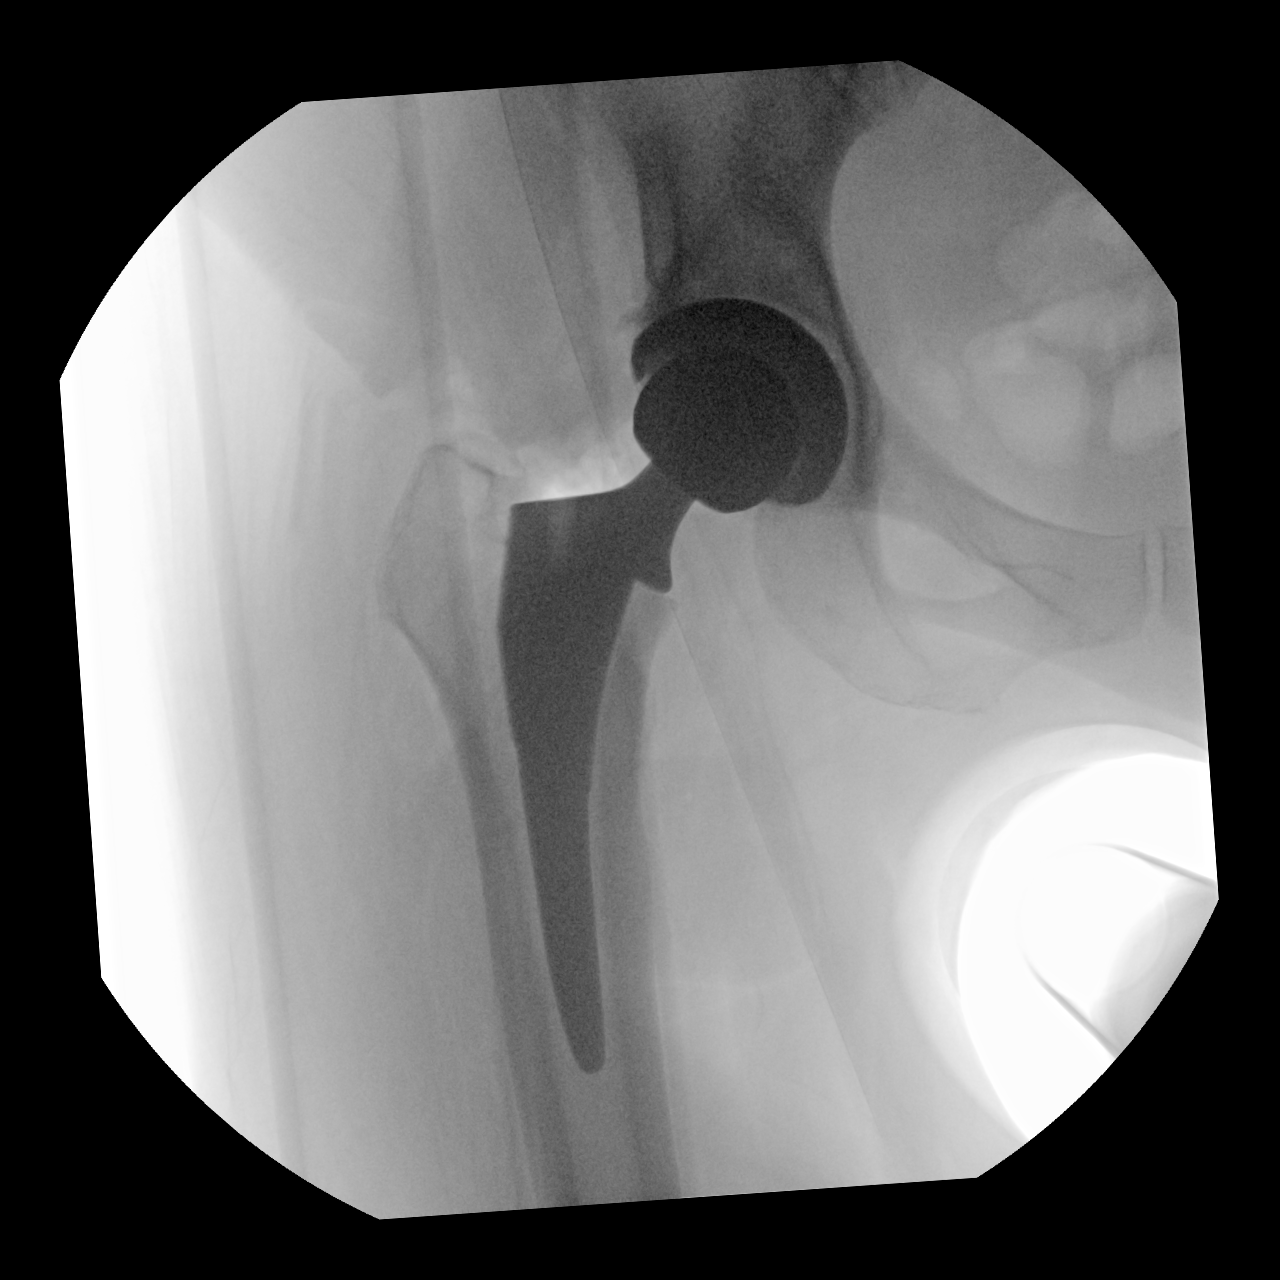
[im 3/3]
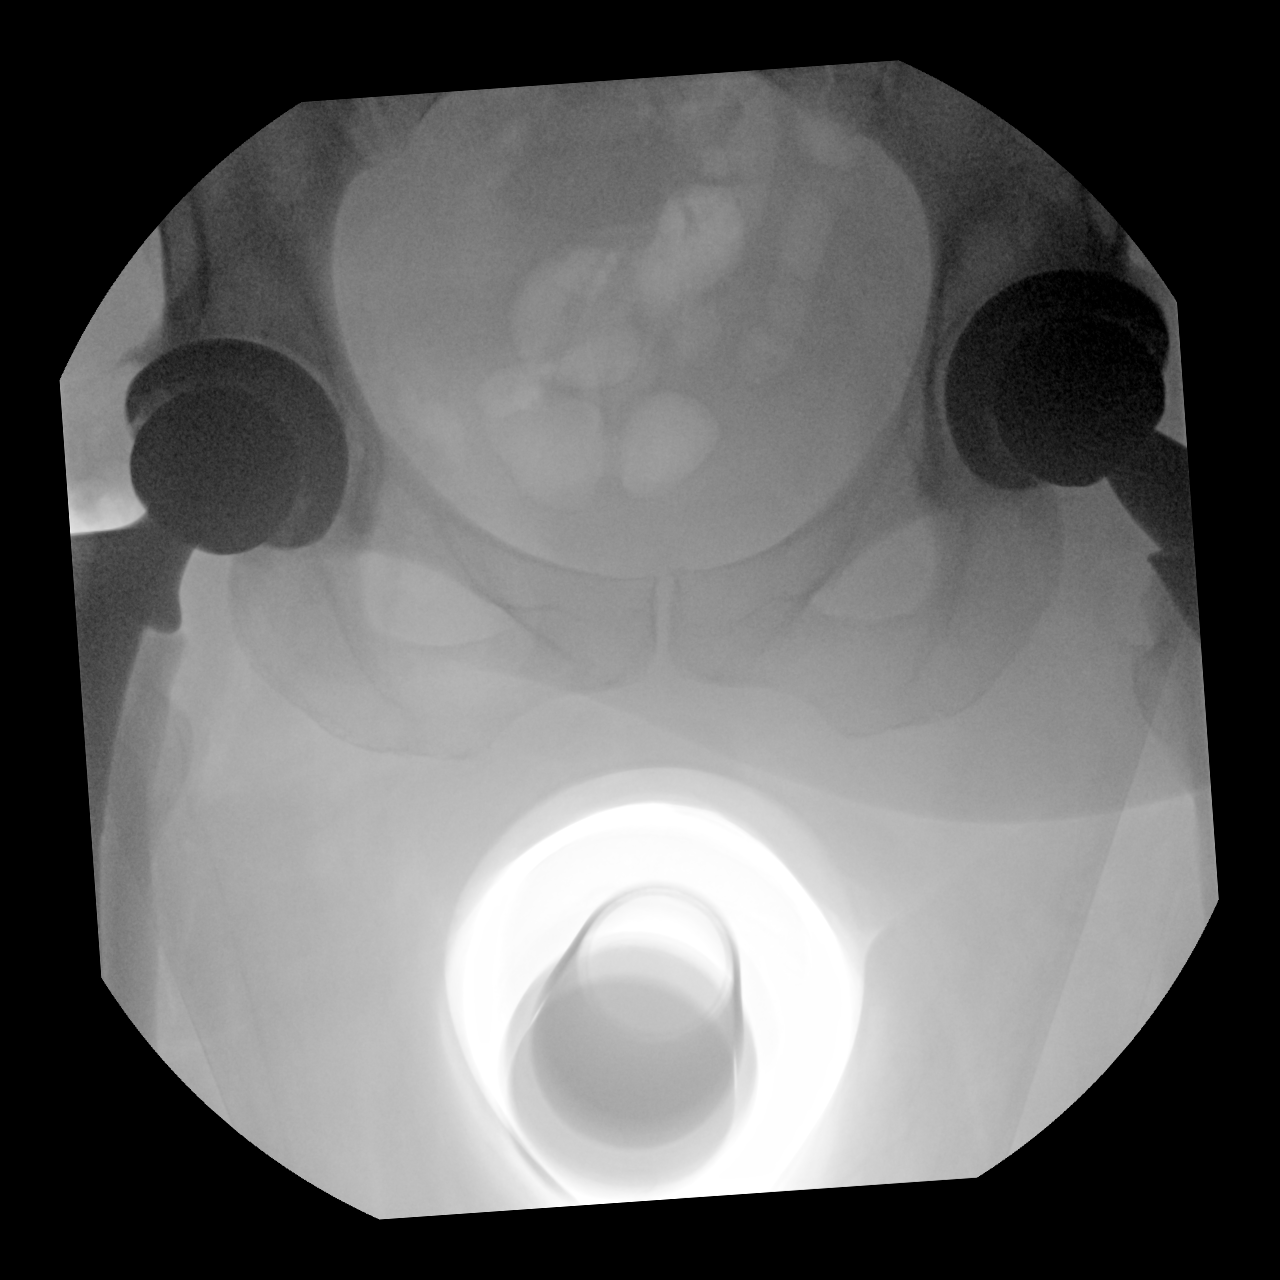

[3 of 3 positions shown; findings below may reference images not displayed]

FINDINGS: RIGHT hip prosthesis in expected position.

Bones demineralized.

No fracture, dislocation, or bone destruction.

Indwelling LEFT hip prosthesis noted.
IMPRESSION: New RIGHT hip prosthesis without acute complication.

## 2021-09-08 ENCOUNTER — Other Ambulatory Visit (HOSPITAL_BASED_OUTPATIENT_CLINIC_OR_DEPARTMENT_OTHER): Payer: Self-pay

## 2021-10-14 ENCOUNTER — Other Ambulatory Visit (HOSPITAL_BASED_OUTPATIENT_CLINIC_OR_DEPARTMENT_OTHER): Payer: Self-pay

## 2021-11-05 ENCOUNTER — Encounter: Payer: Self-pay | Admitting: Obstetrics and Gynecology

## 2021-11-05 ENCOUNTER — Ambulatory Visit (INDEPENDENT_AMBULATORY_CARE_PROVIDER_SITE_OTHER): Payer: 59 | Admitting: Obstetrics and Gynecology

## 2021-11-05 VITALS — BP 118/80 | Ht 69.5 in | Wt 255.0 lb

## 2021-11-05 DIAGNOSIS — Z1211 Encounter for screening for malignant neoplasm of colon: Secondary | ICD-10-CM

## 2021-11-05 DIAGNOSIS — Z01419 Encounter for gynecological examination (general) (routine) without abnormal findings: Secondary | ICD-10-CM | POA: Diagnosis not present

## 2021-11-05 NOTE — Progress Notes (Signed)
51 y.o. G1P1001 Single African American female here for annual exam.    Is cold natured.  TSH 1.59 on 12/11/20.  Hgb 14.0 on 05/05/21. She does periodic labs at her work.   Declines STD screening.   No outbreaks of HSV for over 2 years. Does not take antiviral medication.   She did a stool card at work and it was negative for blood.   She will have a left knee replacement.   PCP:   Grier Mitts, MD   Patient's last menstrual period was 04/29/2016.           Sexually active:   The current method of family planning is status post hysterectomy.    Exercising: Yes.    Home exercise routine includes walking 1 hrs per day. Smoker:  no  Health Maintenance: Pap:  08-24-15 Neg:Neg HR HPV.  Final cervical pathology specimen at hysterectomy was normal. History of abnormal Pap:  no MMG:    03-18-20 Neg/Birads1 Colonoscopy:  n/a BMD:   n/a  Result  n/a TDaP:  12-20-18 Gardasil:   no Screening Labs: Rheumatology and at work.  Will do flu vaccine at work.   reports that she has never smoked. She has never been exposed to tobacco smoke. She has never used smokeless tobacco. She reports that she does not drink alcohol and does not use drugs.  Past Medical History:  Diagnosis Date   Arthritis    osteoarthritis   Complication of anesthesia    bottom of feet itching   Elevated hemoglobin A1c 2018   6.1   Herpes simplex type II infection 2020   Lupus ([place]) 2018   Dr.Deveshwar, currently in normal range   Migraines     Past Surgical History:  Procedure Laterality Date   ABDOMINAL HYSTERECTOMY     CESAREAN SECTION  2000   CYSTOSCOPY N/A 07/06/2016   Procedure: CYSTOSCOPY;  Surgeon: Nunzio Cobbs, MD;  Location: [facility];  Service: Gynecology;  Laterality: N/A;   ingrown toenail Left 07/28/2019   JOINT REPLACEMENT  L   Left hip replacement   LAPAROSCOPIC GASTRIC SLEEVE RESECTION N/A 03/21/2018   Procedure: LAPAROSCOPIC GASTRIC SLEEVE RESECTION, UPPER ENDO, ERAS Pathway;   Surgeon: [name], MD;  Location: [facility];  Service: General;  Laterality: N/A;   OOPHORECTOMY  2007   Rt.oophorectomy    OVARIAN CYST SURGERY  ? and 10   rt removed,cyst off [place] ARTHROPLASTY  07/06/2011   Procedure: TOTAL HIP ARTHROPLASTY;  Surgeon: Kerin Salen, MD;  Location: [facility];  Service: Orthopedics;  Laterality: Left;  DEPUY/PINNACLE   TOTAL HIP ARTHROPLASTY Right 11/21/2018   Procedure: RIGHT TOTAL HIP ARTHROPLASTY ANTERIOR APPROACH;  Surgeon: Frederik Pear, MD;  Location: [facility];  Service: Orthopedics;  Laterality: Right;   TOTAL LAPAROSCOPIC HYSTERECTOMY WITH SALPINGECTOMY Left 07/06/2016   Procedure: HYSTERECTOMY TOTAL LAPAROSCOPIC WITH Left SALPINGECTOMY, Suture Left Side Wall Laceration;  Surgeon: Nunzio Cobbs, MD;  Location: [facility];  Service: Gynecology;  Laterality: Left;    Current Outpatient Medications  Medication Sig Dispense Refill   acetaminophen (TYLENOL) 500 MG tablet Take 1,000 mg by mouth every 6 (six) hours as needed for moderate pain.      calcium carbonate (OS-CAL - DOSED IN MG OF ELEMENTAL CALCIUM) 1250 (500 Ca) MG tablet Take 500 mg by mouth 3 (three) times daily.     CASCARA SAGRADA PO Take by mouth daily.     Cholecalciferol (VITAMIN D)  125 MCG (5000 UT) CAPS Take 5,000 Units by mouth daily.     COVID-19 At Home Antigen Test KIT Use as directed 2 kit 0   COVID-19 mRNA Vac-TriS, Pfizer, (PFIZER-BIONT COVID-19 VAC-TRIS) SUSP injection Inject into the muscle. 0.3 mL 0   diclofenac (VOLTAREN) 50 MG EC tablet Take 1 tablet (50 mg total) by mouth as needed for migraine. 20 tablet 0   diclofenac Sodium (VOLTAREN) 1 % GEL APPLY 2 TO 4 GRAMS TO AFFECTED JOINT UP TO 4 TIMES DAILY 400 g 2   fexofenadine (ALLEGRA) 180 MG tablet Take 180 mg by mouth daily.     hydroxychloroquine (PLAQUENIL) 200 MG tablet TAKE 1 TABLET BY MOUTH 2 TIMES DAILY 180 tablet 0   lidocaine (XYLOCAINE) 5 % ointment Apply 1 application topically 3 (three) times daily. Use  as needed. 1.25 g 0   Menthol, Topical Analgesic, (BIOFREEZE EX) Apply 1 application topically 2 (two) times daily as needed (pain).     Multiple Vitamins-Minerals (BARIATRIC MULTIVITAMINS/IRON PO) Take 1 tablet by mouth 2 (two) times daily.     phentermine (ADIPEX-P) 37.5 MG tablet Take 1 tablet (37.5 mg total) by mouth every morning before breakfast 30 tablet 0   phentermine 37.5 MG capsule Take 1 capsule (37.5 mg total) by mouth every morning before breakfast for 30 days 30 capsule 0   Tetrahydrozoline HCl (VISINE OP) Place 1 drop into both eyes 3 (three) times daily as needed (for irritated/dry eyes.).     traMADol (ULTRAM) 50 MG tablet Take 1 tablet by mouth three times a day as needed for pain 90 tablet 2   No current facility-administered medications for this visit.    Family History  Problem Relation Age of Onset   Osteoarthritis Mother    Osteoarthritis Brother    Hypertension Brother    Diabetes Brother    Osteoarthritis Maternal Grandmother    Arthritis Maternal Grandmother     Review of Systems  All other systems reviewed and are negative.   Exam:   BP 118/80 (BP Location: Right Arm, Patient Position: Sitting, Cuff Size: Large)   Ht 5' 9.5" (1.765 m)   Wt 255 lb (115.7 kg)   LMP 04/29/2016   BMI 37.12 kg/m     General appearance: alert, cooperative and appears stated age Head: normocephalic, without obvious abnormality, atraumatic Neck: no adenopathy, supple, symmetrical, trachea midline and thyroid normal to inspection and palpation Lungs: clear to auscultation bilaterally Breasts: normal appearance, no masses or tenderness, No nipple retraction or dimpling, No nipple discharge or bleeding, No axillary adenopathy Heart: regular rate and rhythm Abdomen: soft, non-tender; no masses, no organomegaly Extremities: extremities normal, atraumatic, no cyanosis or edema Skin: skin color, texture, turgor normal. No rashes or lesions Lymph nodes: cervical, supraclavicular,  and axillary nodes normal. Neurologic: grossly normal  Pelvic: External genitalia:  no lesions              No abnormal inguinal nodes palpated.              Urethra:  normal appearing urethra with no masses, tenderness or lesions              Bartholins and Skenes: normal                 Vagina: normal appearing vagina with normal color and discharge, no lesions              Cervix: absent  Pap taken: no Bimanual Exam:  Uterus:  absent              Adnexa: no mass, fullness, tenderness              Rectal exam: yes.  Confirms.              Anus:  normal sphincter tone, no lesions  Chaperone was present for exam:  yes.  Assessment:   Well woman visit with gynecologic exam. Status post gastric sleeve surgery.  Status post RSO.  Status post total laparoscopic hysterectomy with left salpingectomy/cystoscopy.  Still has left ovary.  Lupus.   Arthritic pain.  On Plaquenil.  Hx AODM.  Normal A1C.  HSV II. Colon cancer screening.   Plan: Mammogram screening discussed.  She will schedule. Self breast awareness reviewed. Pap and HR HPV as above. Guidelines for Calcium, Vitamin D, regular exercise program including cardiovascular and weight bearing exercise. Colonoscopy referral.  Follow up annually and prn.   After visit summary provided.

## 2021-11-05 NOTE — Patient Instructions (Signed)

## 2021-11-06 NOTE — Progress Notes (Deleted)
Office Visit Note  Patient: Julia Monroe             Date of Birth: 02/29/1970           MRN: [id number]             PCP: Billie Ruddy, MD Referring: Billie Ruddy, MD Visit Date: 11/19/2021 Occupation: '@GUAROCC'$ @  Subjective:  No chief complaint on file.   History of Present Illness: Julia Monroe is a 51 y.o. female ***   Activities of Daily Living:  Patient reports morning stiffness for *** {minute/hour:19697}.   Patient {ACTIONS;DENIES/REPORTS:21021675::"Denies"} nocturnal pain.  Difficulty dressing/grooming: {ACTIONS;DENIES/REPORTS:21021675::"Denies"} Difficulty climbing stairs: {ACTIONS;DENIES/REPORTS:21021675::"Denies"} Difficulty getting out of chair: {ACTIONS;DENIES/REPORTS:21021675::"Denies"} Difficulty using hands for taps, buttons, cutlery, and/or writing: {ACTIONS;DENIES/REPORTS:21021675::"Denies"}  No Rheumatology ROS completed.   PMFS History:  Patient Active Problem List   Diagnosis Date Noted   History of total hip arthroplasty, right 11/21/2018   Prediabetes 03/21/2018   SLE (systemic lupus erythematosus) ([place]) 03/21/2018   Obesity 03/21/2018   History of migraine 08/27/2017   Class 3 severe obesity due to excess calories without serious comorbidity with body mass index (BMI) of 50.0 to 59.9 in adult ([place]) 01/27/2017   Status post bilateral total hip replacement 10/27/2016   Hyperglycemia 08/25/2016   Leukocytosis 08/13/2016   Status post laparoscopic hysterectomy 07/06/2016   Primary osteoarthritis of both knees 04/29/2016   ANA positive 04/29/2016   Primary osteoarthritis of right hip 07/10/2011    Past Medical History:  Diagnosis Date   Arthritis    osteoarthritis   Complication of anesthesia    bottom of feet itching   Elevated hemoglobin A1c 2018   6.1   Herpes simplex type II infection 2020   Lupus ([place]) 2018   Dr.Deveshwar, currently in normal range   Migraines     Family History  Problem Relation Age of Onset    Osteoarthritis Mother    Osteoarthritis Brother    Hypertension Brother    Diabetes Brother    Osteoarthritis Maternal Grandmother    Arthritis Maternal Grandmother    Past Surgical History:  Procedure Laterality Date   ABDOMINAL HYSTERECTOMY     CESAREAN SECTION  2000   CYSTOSCOPY N/A 07/06/2016   Procedure: CYSTOSCOPY;  Surgeon: Nunzio Cobbs, MD;  Location: [facility];  Service: Gynecology;  Laterality: N/A;   ingrown toenail Left 07/28/2019   JOINT REPLACEMENT  L   Left hip replacement   LAPAROSCOPIC GASTRIC SLEEVE RESECTION N/A 03/21/2018   Procedure: LAPAROSCOPIC GASTRIC SLEEVE RESECTION, UPPER ENDO, ERAS Pathway;  Surgeon: [name], MD;  Location: [facility];  Service: General;  Laterality: N/A;   OOPHORECTOMY  2007   Rt.oophorectomy    OVARIAN CYST SURGERY  ? and 10   rt removed,cyst off [place] ARTHROPLASTY  07/06/2011   Procedure: TOTAL HIP ARTHROPLASTY;  Surgeon: Kerin Salen, MD;  Location: [facility];  Service: Orthopedics;  Laterality: Left;  DEPUY/PINNACLE   TOTAL HIP ARTHROPLASTY Right 11/21/2018   Procedure: RIGHT TOTAL HIP ARTHROPLASTY ANTERIOR APPROACH;  Surgeon: Frederik Pear, MD;  Location: [facility];  Service: Orthopedics;  Laterality: Right;   TOTAL LAPAROSCOPIC HYSTERECTOMY WITH SALPINGECTOMY Left 07/06/2016   Procedure: HYSTERECTOMY TOTAL LAPAROSCOPIC WITH Left SALPINGECTOMY, Suture Left Side Wall Laceration;  Surgeon: Nunzio Cobbs, MD;  Location: [facility];  Service: Gynecology;  Laterality: Left;   Social History   Social History Narrative   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[employer]  Home Situation: 58 yo daughter in 2018      Spiritual Beliefs:      Lifestyle: starting healthy low sugar diet 08/2016/[facility]   Immunization History  Administered Date(s) Administered   Influenza-Unspecified 10/26/2013, 10/26/2016   PFIZER Comirnaty(Gray Top)Covid-19 Tri-Sucrose Vaccine 08/02/2020   PFIZER(Purple Top)SARS-COV-2  Vaccination 04/28/2019, 05/23/2019, 01/12/2020   Pneumococcal Conjugate-13 11/02/2016   Tdap 01/26/2009, 12/20/2018     Objective: Vital Signs: LMP 04/29/2016    Physical Exam   Musculoskeletal Exam: ***  CDAI Exam: CDAI Score: -- Patient Global: --; Provider Global: -- Swollen: --; Tender: -- Joint Exam 11/19/2021   No joint exam has been documented for this visit   There is currently no information documented on the homunculus. Go to the Rheumatology activity and complete the homunculus joint exam.  Investigation: No additional findings.  Imaging: No results found.  Recent Labs: Lab Results  Component Value Date   WBC 7.1 05/05/2021   HGB 14.0 05/05/2021   PLT 209 05/05/2021   NA 139 05/05/2021   K 4.4 05/05/2021   CL 105 05/05/2021   CO2 26 05/05/2021   GLUCOSE 68 05/05/2021   BUN 16 05/05/2021   CREATININE 0.59 05/05/2021   BILITOT 0.5 05/05/2021   ALKPHOS 49 05/11/2018   AST 16 05/05/2021   ALT 11 05/05/2021   PROT 7.2 05/05/2021   ALBUMIN 4.1 05/11/2018   CALCIUM 9.3 05/05/2021   GFRAA 101 07/05/2020    Speciality Comments: PLQ eye exam: 11/29/2020 WNL [employer]. Follow up in 1 year.    Procedures:  No procedures performed Allergies: Monosodium glutamate, Hydromorphone, Lactose intolerance (gi), Caffeine, Hydrocodone, and Oxycodone   Assessment / Plan:     Visit Diagnoses: No diagnosis found.  Orders: No orders of the defined types were placed in this encounter.  No orders of the defined types were placed in this encounter.   Face-to-face time spent with patient was *** minutes. Greater than 50% of time was spent in counseling and coordination of care.  Follow-Up Instructions: No follow-ups on file.   Earnestine Mealing, CMA  Note - This record has been created using Editor, commissioning.  Chart creation errors have been sought, but may not always  have been located. Such creation errors do not reflect on  the standard of  medical care.

## 2021-11-12 ENCOUNTER — Other Ambulatory Visit (HOSPITAL_BASED_OUTPATIENT_CLINIC_OR_DEPARTMENT_OTHER): Payer: Self-pay

## 2021-11-12 ENCOUNTER — Ambulatory Visit: Payer: 59 | Admitting: Family Medicine

## 2021-11-12 VITALS — BP 118/82 | HR 80 | Temp 98.8°F | Wt 250.6 lb

## 2021-11-12 DIAGNOSIS — R052 Subacute cough: Secondary | ICD-10-CM

## 2021-11-12 DIAGNOSIS — R0982 Postnasal drip: Secondary | ICD-10-CM | POA: Diagnosis not present

## 2021-11-12 DIAGNOSIS — J302 Other seasonal allergic rhinitis: Secondary | ICD-10-CM | POA: Diagnosis not present

## 2021-11-12 MED ORDER — FLUTICASONE PROPIONATE 50 MCG/ACT NA SUSP
1.0000 | Freq: Every day | NASAL | 2 refills | Status: DC
  Filled 2021-11-12: qty 16, 30d supply, fill #0
  Filled 2022-06-03: qty 16, 30d supply, fill #1

## 2021-11-12 MED ORDER — LEVOCETIRIZINE DIHYDROCHLORIDE 5 MG PO TABS
5.0000 mg | ORAL_TABLET | Freq: Every evening | ORAL | 1 refills | Status: DC
  Filled 2021-11-12: qty 30, 30d supply, fill #0

## 2021-11-12 NOTE — Progress Notes (Signed)
Subjective:    Patient ID: Julia Monroe, female    DOB: Jun 23, 1970, 51 y.o.   MRN: [id number]  Chief Complaint  Patient presents with   Cough    Persistant cough, for about 2.5 months. Stopped medication about 4-5 months. Has not been sick since 2017, now coughing and tickle in throat. The tickle makes her cough, but cough is not productive. Can feel drainage but nothing comes up.     HPI Patient was seen today for ongoing concern.  Pt with cough x 2 months.  Pt states symptoms started after stopping allergy med Allegra.  Pt restarted med maybe 2 wks ago.  Cough is dry, like there's a tickle in throat.  Pt states she does not feel bad.  Pt states a few months ago she may have had a positive covid test as there was a faint line present, but the next day when retested no line was present.    Past Medical History:  Diagnosis Date   Arthritis    osteoarthritis   Complication of anesthesia    bottom of feet itching   Elevated hemoglobin A1c 2018   6.1   Herpes simplex type II infection 2020   Lupus ([place]) 2018   Dr.Deveshwar, currently in normal range   Migraines     Allergies  Allergen Reactions   Monosodium Glutamate Other (See Comments)    Loses vision, headache/migraine   Hydromorphone     Makes patient "Loopy" and dreams terrible things   Lactose Intolerance (Gi) Other (See Comments)    Gi upset   Caffeine Other (See Comments)    Severe headache    Hydrocodone Other (See Comments)    Headaches/migraines    Oxycodone Other (See Comments)    Headache/Migraine    ROS General: Denies fever, chills, night sweats, changes in weight, changes in appetite HEENT: Denies headaches, ear pain, changes in vision, rhinorrhea, sore throat +drainage/tickle in throat CV: Denies CP, palpitations, SOB, orthopnea Pulm: Denies SOB, cough, wheezing  +dry cough GI: Denies abdominal pain, nausea, vomiting, diarrhea, constipation GU: Denies dysuria, hematuria, frequency, vaginal  discharge Msk: Denies muscle cramps, joint pains Neuro: Denies weakness, numbness, tingling Skin: Denies rashes, bruising Psych: Denies depression, anxiety, hallucinations     Objective:    Blood pressure 118/82, pulse 80, temperature 98.8 F (37.1 C), temperature source Oral, weight 250 lb 9.6 oz (113.7 kg), last menstrual period 04/29/2016, SpO2 99 %.  Gen. Pleasant, well-nourished, in no distress, normal affect   HEENT: [place]/AT, face symmetric, conjunctiva clear, no scleral icterus, PERRLA, EOMI, nares patent without drainage, pharynx with clear post nasal drainage, no erythema or exudate. Lungs: no accessory muscle use, CTAB, no wheezes or rales Cardiovascular: RRR, no m/r/g, no peripheral edema Musculoskeletal: No deformities, no cyanosis or clubbing, normal tone Neuro:  A&Ox3, CN II-XII intact, normal gait Skin:  Warm, no lesions/ rash  Wt Readings from Last 3 Encounters:  11/12/21 250 lb 9.6 oz (113.7 kg)  11/05/21 255 lb (115.7 kg)  05/14/21 237 lb (107.5 kg)    Lab Results  Component Value Date   WBC 7.1 05/05/2021   HGB 14.0 05/05/2021   HCT 42.9 05/05/2021   PLT 209 05/05/2021   GLUCOSE 68 05/05/2021   CHOL 172 05/05/2021   TRIG 38 05/05/2021   HDL 60 05/05/2021   LDLCALC 101 (H) 05/05/2021   ALT 11 05/05/2021   AST 16 05/05/2021   NA 139 05/05/2021   K 4.4 05/05/2021   CL 105 05/05/2021  CREATININE 0.59 05/05/2021   BUN 16 05/05/2021   CO2 26 05/05/2021   TSH 1.59 12/11/2020   INR 1.0 11/15/2018   HGBA1C 5.3 12/11/2020    Assessment/Plan:  Seasonal allergies  - Plan: fluticasone (FLONASE) 50 MCG/ACT nasal spray, levocetirizine (XYZAL ALLERGY 24HR) 5 MG tablet  Post-nasal drainage  - Plan: fluticasone (FLONASE) 50 MCG/ACT nasal spray  Subacute cough  Postnasal drainage and seasonal allergies likely contributing to cough.  Patient advised to try OTC antihistamine and continue supportive care.  Prescription for Xyzal and Flonase sent to pharmacy.   Given strict precautions.  Follow-up as needed for continued or worsening symptoms.   Grier Mitts, MD

## 2021-11-19 ENCOUNTER — Ambulatory Visit: Payer: 59 | Admitting: Physician Assistant

## 2021-11-19 DIAGNOSIS — R5383 Other fatigue: Secondary | ICD-10-CM

## 2021-11-19 DIAGNOSIS — R7309 Other abnormal glucose: Secondary | ICD-10-CM

## 2021-11-19 DIAGNOSIS — Z79899 Other long term (current) drug therapy: Secondary | ICD-10-CM

## 2021-11-19 DIAGNOSIS — M3219 Other organ or system involvement in systemic lupus erythematosus: Secondary | ICD-10-CM

## 2021-11-19 DIAGNOSIS — M62838 Other muscle spasm: Secondary | ICD-10-CM

## 2021-11-19 DIAGNOSIS — Z96643 Presence of artificial hip joint, bilateral: Secondary | ICD-10-CM

## 2021-11-19 DIAGNOSIS — M7712 Lateral epicondylitis, left elbow: Secondary | ICD-10-CM

## 2021-11-19 DIAGNOSIS — M170 Bilateral primary osteoarthritis of knee: Secondary | ICD-10-CM

## 2021-11-19 DIAGNOSIS — Z8669 Personal history of other diseases of the nervous system and sense organs: Secondary | ICD-10-CM

## 2021-11-19 DIAGNOSIS — E559 Vitamin D deficiency, unspecified: Secondary | ICD-10-CM

## 2021-11-28 ENCOUNTER — Encounter: Payer: Self-pay | Admitting: Family Medicine

## 2021-12-02 ENCOUNTER — Ambulatory Visit (HOSPITAL_BASED_OUTPATIENT_CLINIC_OR_DEPARTMENT_OTHER)
Admission: RE | Admit: 2021-12-02 | Discharge: 2021-12-02 | Disposition: A | Discharge status: Home / Self Care | Payer: 59 | Source: Ambulatory Visit | Attending: Obstetrics and Gynecology | Admitting: Obstetrics and Gynecology

## 2021-12-02 ENCOUNTER — Other Ambulatory Visit (HOSPITAL_BASED_OUTPATIENT_CLINIC_OR_DEPARTMENT_OTHER): Payer: Self-pay | Admitting: Obstetrics and Gynecology

## 2021-12-02 DIAGNOSIS — Z1231 Encounter for screening mammogram for malignant neoplasm of breast: Secondary | ICD-10-CM | POA: Insufficient documentation

## 2021-12-12 ENCOUNTER — Other Ambulatory Visit (HOSPITAL_BASED_OUTPATIENT_CLINIC_OR_DEPARTMENT_OTHER): Payer: Self-pay

## 2021-12-12 ENCOUNTER — Other Ambulatory Visit: Payer: Self-pay | Admitting: Family Medicine

## 2021-12-12 DIAGNOSIS — Z8669 Personal history of other diseases of the nervous system and sense organs: Secondary | ICD-10-CM

## 2021-12-15 ENCOUNTER — Other Ambulatory Visit: Payer: Self-pay | Admitting: Family Medicine

## 2021-12-15 ENCOUNTER — Other Ambulatory Visit (HOSPITAL_BASED_OUTPATIENT_CLINIC_OR_DEPARTMENT_OTHER): Payer: Self-pay

## 2021-12-15 DIAGNOSIS — Z8669 Personal history of other diseases of the nervous system and sense organs: Secondary | ICD-10-CM

## 2021-12-16 ENCOUNTER — Other Ambulatory Visit (HOSPITAL_BASED_OUTPATIENT_CLINIC_OR_DEPARTMENT_OTHER): Payer: Self-pay

## 2021-12-16 MED ORDER — DICLOFENAC SODIUM 50 MG PO TBEC
50.0000 mg | DELAYED_RELEASE_TABLET | ORAL | 0 refills | Status: AC | PRN
  Filled 2021-12-16: qty 20, 20d supply, fill #0

## 2021-12-31 ENCOUNTER — Other Ambulatory Visit (HOSPITAL_BASED_OUTPATIENT_CLINIC_OR_DEPARTMENT_OTHER): Payer: Self-pay

## 2022-01-05 ENCOUNTER — Other Ambulatory Visit (HOSPITAL_BASED_OUTPATIENT_CLINIC_OR_DEPARTMENT_OTHER): Payer: Self-pay

## 2022-01-07 ENCOUNTER — Other Ambulatory Visit (HOSPITAL_BASED_OUTPATIENT_CLINIC_OR_DEPARTMENT_OTHER): Payer: Self-pay

## 2022-01-07 DIAGNOSIS — M1712 Unilateral primary osteoarthritis, left knee: Secondary | ICD-10-CM | POA: Diagnosis not present

## 2022-01-07 MED ORDER — TRAMADOL HCL 50 MG PO TABS
50.0000 mg | ORAL_TABLET | Freq: Three times a day (TID) | ORAL | 2 refills | Status: DC | PRN
  Filled 2022-03-12: qty 90, 30d supply, fill #0
  Filled 2022-05-21: qty 90, 30d supply, fill #1

## 2022-01-29 ENCOUNTER — Ambulatory Visit (INDEPENDENT_AMBULATORY_CARE_PROVIDER_SITE_OTHER): Payer: Commercial Managed Care - PPO | Admitting: Family Medicine

## 2022-01-29 ENCOUNTER — Other Ambulatory Visit (HOSPITAL_BASED_OUTPATIENT_CLINIC_OR_DEPARTMENT_OTHER): Payer: Self-pay

## 2022-01-29 VITALS — BP 110/76 | HR 79 | Temp 99.1°F | Wt 261.2 lb

## 2022-01-29 DIAGNOSIS — M6740 Ganglion, unspecified site: Secondary | ICD-10-CM | POA: Diagnosis not present

## 2022-01-29 DIAGNOSIS — Z903 Acquired absence of stomach [part of]: Secondary | ICD-10-CM | POA: Diagnosis not present

## 2022-01-29 DIAGNOSIS — Z6838 Body mass index (BMI) 38.0-38.9, adult: Secondary | ICD-10-CM | POA: Diagnosis not present

## 2022-01-29 DIAGNOSIS — E6609 Other obesity due to excess calories: Secondary | ICD-10-CM | POA: Diagnosis not present

## 2022-01-29 MED ORDER — PHENTERMINE HCL 37.5 MG PO CAPS
37.5000 mg | ORAL_CAPSULE | Freq: Every morning | ORAL | 0 refills | Status: DC
  Filled 2022-01-29: qty 30, 30d supply, fill #0

## 2022-01-29 NOTE — Progress Notes (Signed)
   Acute Office Visit  Subjective:     Patient ID: Julia Monroe, female    DOB: [birth year], 52 y.o.   MRN: [id number]  Chief Complaint  Patient presents with   Mass    Pt c/o node on R wrist. Notice it about a month or 2 months ago. The node moves after remember she hit her wrist against something.     HPI Patient is a 52 yo female with pmh sig for SLE, OA of b/l knees s/p b/l THR, obesity s/p gastric sleeve who is in today for ongoing concerns.  Pt with round bump on dorsum R hand x a few months.  Area started to move.  Painful at times when touched.  Does not recall hitting hand against anything.  No edema.  Taking bariatric MVI daily.  Interested in weight loss medication.  Previously on Phentermine.  Review of Systems  Constitutional:  Negative for weight loss.  Skin:  Negative for itching and rash.       Bump on dorsum of R hand        Objective:    BP 110/76 (BP Location: Right Arm, Patient Position: Sitting, Cuff Size: Large)   Pulse 79   Temp 99.1 F (37.3 C) (Oral)   Wt 261 lb 3.2 oz (118.5 kg)   LMP 04/29/2016   SpO2 99%   BMI 38.02 kg/m  BP Readings from Last 3 Encounters:  01/29/22 110/76  11/12/21 118/82  11/05/21 118/80   Wt Readings from Last 3 Encounters:  01/29/22 261 lb 3.2 oz (118.5 kg)  11/12/21 250 lb 9.6 oz (113.7 kg)  11/05/21 255 lb (115.7 kg)    Physical Exam Gen. Pleasant, well developed, well-nourished, in NAD HEENT - [place]/AT, PERRL, EOMI, conjunctive clear, no scleral icterus, no nasal drainage Lungs: no use of accessory muscles, no dullness to percussion, CTAB, no wheezes, rales or rhonchi Cardiovascular: RRR,  No r/g/m, no peripheral edema Musculoskeletal: Mobile, round, cystic lesion dorsum on R hand.  No deformities, moves all four extremities, no cyanosis or clubbing, normal tone Neuro:  A&Ox3, CN II-XII intact, normal gait Skin:  Warm, dry, intact, no lesions   No results found for any visits on 01/29/22.       Assessment & Plan:   Problem List Items Addressed This Visit       Other   Class 3 severe obesity due to excess calories without serious comorbidity with body mass index (BMI) of 50.0 to 59.9 in adult [facility])   Relevant Medications   phentermine 37.5 MG capsule   Other Visit Diagnoses     Ganglion cyst    -  Primary   Relevant Orders   Ambulatory referral to Hand Surgery       Meds ordered this encounter  Medications   phentermine 37.5 MG capsule    Sig: Take 1 capsule (37.5 mg total) by mouth every morning before breakfast for 30 days    Dispense:  30 capsule    Refill:  0   Referred to hand surgery for ganglion cyst removal.  Body mass index is 38.02 kg/m.  Restart phentermine 37.5 mg.  Monitor weight.  Continue lifestyle modifications.  Continue Bariatric MVI.  Return in about 6 weeks (around 03/12/2022).  Billie Ruddy, MD

## 2022-01-30 ENCOUNTER — Other Ambulatory Visit (HOSPITAL_BASED_OUTPATIENT_CLINIC_OR_DEPARTMENT_OTHER): Payer: Self-pay

## 2022-02-05 DIAGNOSIS — M79641 Pain in right hand: Secondary | ICD-10-CM | POA: Diagnosis not present

## 2022-02-05 DIAGNOSIS — M6740 Ganglion, unspecified site: Secondary | ICD-10-CM | POA: Diagnosis not present

## 2022-02-12 ENCOUNTER — Encounter: Payer: Self-pay | Admitting: Family Medicine

## 2022-02-12 DIAGNOSIS — Z903 Acquired absence of stomach [part of]: Secondary | ICD-10-CM | POA: Insufficient documentation

## 2022-03-12 ENCOUNTER — Other Ambulatory Visit (HOSPITAL_BASED_OUTPATIENT_CLINIC_OR_DEPARTMENT_OTHER): Payer: Self-pay

## 2022-05-04 ENCOUNTER — Ambulatory Visit: Payer: Commercial Managed Care - PPO | Admitting: Family Medicine

## 2022-05-14 DIAGNOSIS — M1712 Unilateral primary osteoarthritis, left knee: Secondary | ICD-10-CM | POA: Diagnosis not present

## 2022-05-18 DIAGNOSIS — M25662 Stiffness of left knee, not elsewhere classified: Secondary | ICD-10-CM | POA: Diagnosis not present

## 2022-05-18 DIAGNOSIS — M1712 Unilateral primary osteoarthritis, left knee: Secondary | ICD-10-CM | POA: Diagnosis not present

## 2022-05-18 DIAGNOSIS — M6281 Muscle weakness (generalized): Secondary | ICD-10-CM | POA: Diagnosis not present

## 2022-05-21 ENCOUNTER — Other Ambulatory Visit (HOSPITAL_BASED_OUTPATIENT_CLINIC_OR_DEPARTMENT_OTHER): Payer: Self-pay

## 2022-05-21 DIAGNOSIS — M25662 Stiffness of left knee, not elsewhere classified: Secondary | ICD-10-CM | POA: Diagnosis not present

## 2022-05-21 DIAGNOSIS — M6281 Muscle weakness (generalized): Secondary | ICD-10-CM | POA: Diagnosis not present

## 2022-05-21 DIAGNOSIS — M1712 Unilateral primary osteoarthritis, left knee: Secondary | ICD-10-CM | POA: Diagnosis not present

## 2022-05-26 ENCOUNTER — Telehealth: Payer: Self-pay | Admitting: *Deleted

## 2022-05-26 DIAGNOSIS — R5383 Other fatigue: Secondary | ICD-10-CM

## 2022-05-26 DIAGNOSIS — M3219 Other organ or system involvement in systemic lupus erythematosus: Secondary | ICD-10-CM

## 2022-05-26 DIAGNOSIS — M25662 Stiffness of left knee, not elsewhere classified: Secondary | ICD-10-CM | POA: Diagnosis not present

## 2022-05-26 DIAGNOSIS — M6281 Muscle weakness (generalized): Secondary | ICD-10-CM | POA: Diagnosis not present

## 2022-05-26 DIAGNOSIS — M1712 Unilateral primary osteoarthritis, left knee: Secondary | ICD-10-CM | POA: Diagnosis not present

## 2022-05-26 DIAGNOSIS — Z79899 Other long term (current) drug therapy: Secondary | ICD-10-CM

## 2022-05-26 NOTE — Telephone Encounter (Signed)
Please order CBC with differential, CMP with GFR, urine protein creatinine ratio, sed rate, C3-C4, ANA, double-stranded DNA, B12, fasting lipid panel

## 2022-05-26 NOTE — Telephone Encounter (Signed)
Patient called requesting all labs from 2022 and 2023 repeated and add B12. Patient hasn't been seen since 04/2021. Patient is coming in for labs 05/29/2022. Patient scheduled f/u appt. 06/08/2022. OK to order all labs?

## 2022-05-27 NOTE — Addendum Note (Signed)
Addended by: Henriette Combs on: 05/27/2022 02:52 PM   Modules accepted: Orders

## 2022-05-27 NOTE — Progress Notes (Signed)
Office Visit Note  Patient: Julia Monroe             Date of Birth: 1970/07/01           MRN: [id number]             PCP: Deeann Saint, MD Referring: Deeann Saint, MD Visit Date: 06/08/2022 Occupation: @GUAROCC @  Subjective:  Left knee pain--replacement scheduled   History of Present Illness: Julia Monroe is a 52 y.o. female with history of systemic lupus erythematosus.  Patient has been off of Plaquenil for the past 2 years.  Patient states that she has not noticed any new or worsening symptoms since discontinuing Plaquenil.  While taking Plaquenil she did not notice any clinical benefit.  Patient states that she is scheduled for an upcoming left knee replacement with Dr. Turner Daniels on 06/19/2022.  She states that she has had ongoing pain in both knees and her left shoulder.  Her hip replacements are doing well.  She denies any joint swelling.  She denies any symptoms of Raynaud's phenomenon.  She has not had any recent rashes including a Malar rash.  She denies any symptoms of Raynaud's phenomenon.  She denies any sores in her mouth or nose.  She denies any sicca symptoms.  She has not noticed any swollen lymph nodes.   Activities of Daily Living:  Patient reports morning stiffness for all day. Patient Reports nocturnal pain.  Difficulty dressing/grooming: Denies Difficulty climbing stairs: Reports Difficulty getting out of chair: Reports Difficulty using hands for taps, buttons, cutlery, and/or writing: Denies  Review of Systems  Constitutional:  Positive for fatigue.  HENT:  Negative for mouth sores and mouth dryness.   Eyes:  Negative for dryness.  Respiratory:  Negative for shortness of breath.   Cardiovascular:  Negative for chest pain and palpitations.  Gastrointestinal:  Negative for blood in stool, constipation and diarrhea.  Endocrine: Negative for increased urination.  Genitourinary:  Negative for involuntary urination.  Musculoskeletal:  Positive for  joint pain, gait problem, joint pain and morning stiffness. Negative for joint swelling, myalgias, muscle weakness, muscle tenderness and myalgias.  Skin:  Positive for rash. Negative for color change, hair loss and sensitivity to sunlight.  Allergic/Immunologic: Negative for susceptible to infections.  Neurological:  Negative for dizziness and headaches.  Hematological:  Negative for swollen glands.  Psychiatric/Behavioral:  Negative for depressed mood and sleep disturbance. The patient is not nervous/anxious.     PMFS History:  Patient Active Problem List   Diagnosis Date Noted   H/O gastric sleeve 02/12/2022   History of total hip arthroplasty, right 11/21/2018   Prediabetes 03/21/2018   SLE (systemic lupus erythematosus) (HCC) 03/21/2018   Obesity 03/21/2018   History of migraine 08/27/2017   Class 3 severe obesity due to excess calories without serious comorbidity with body mass index (BMI) of 50.0 to 59.9 in adult [facility]) 01/27/2017   Status post bilateral total hip replacement 10/27/2016   Hyperglycemia 08/25/2016   Leukocytosis 08/13/2016   Status post laparoscopic hysterectomy 07/06/2016   Primary osteoarthritis of both knees 04/29/2016   ANA positive 04/29/2016   Primary osteoarthritis of right hip 07/10/2011    Past Medical History:  Diagnosis Date   Arthritis    osteoarthritis   Complication of anesthesia    bottom of feet itching   Elevated hemoglobin A1c 2018   6.1   Herpes simplex type II infection 2020   Lupus (HCC) 2018   Dr.Deveshwar, currently in normal  range   Migraines     Family History  Problem Relation Age of Onset   Osteoarthritis Mother    Osteoarthritis Brother    Hypertension Brother    Diabetes Brother    Osteoarthritis Maternal Grandmother    Arthritis Maternal Grandmother    Past Surgical History:  Procedure Laterality Date   ABDOMINAL HYSTERECTOMY     CESAREAN SECTION  2000   CYSTOSCOPY N/A 07/06/2016   Procedure: CYSTOSCOPY;  Surgeon:  Patton Salles, MD;  Location: [facility];  Service: Gynecology;  Laterality: N/A;   ingrown toenail Left 07/28/2019   JOINT REPLACEMENT  L   Left hip replacement   LAPAROSCOPIC GASTRIC SLEEVE RESECTION N/A 03/21/2018   Procedure: LAPAROSCOPIC GASTRIC SLEEVE RESECTION, UPPER ENDO, ERAS Pathway;  Surgeon: Gaynelle Adu, MD;  Location: [facility];  Service: General;  Laterality: N/A;   OOPHORECTOMY  2007   Rt.oophorectomy    OVARIAN CYST SURGERY  ? and 10   rt removed,cyst off lft   TOTAL HIP ARTHROPLASTY  07/06/2011   Procedure: TOTAL HIP ARTHROPLASTY;  Surgeon: Nestor Lewandowsky, MD;  Location: [facility];  Service: Orthopedics;  Laterality: Left;  DEPUY/PINNACLE   TOTAL HIP ARTHROPLASTY Right 11/21/2018   Procedure: RIGHT TOTAL HIP ARTHROPLASTY ANTERIOR APPROACH;  Surgeon: Gean Birchwood, MD;  Location: [facility];  Service: Orthopedics;  Laterality: Right;   TOTAL LAPAROSCOPIC HYSTERECTOMY WITH SALPINGECTOMY Left 07/06/2016   Procedure: HYSTERECTOMY TOTAL LAPAROSCOPIC WITH Left SALPINGECTOMY, Suture Left Side Wall Laceration;  Surgeon: Patton Salles, MD;  Location: [facility];  Service: Gynecology;  Laterality: Left;   Social History   Social History Narrative   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[employer]      Home Situation: 40 yo daughter in 2018      Spiritual Beliefs:      Lifestyle: starting healthy low sugar diet 08/2016/considering exercising at the [employer] History  Administered Date(s) Administered   Influenza-Unspecified 10/26/2013, 10/26/2016   PFIZER Comirnaty(Gray Top)Covid-19 Tri-Sucrose Vaccine 08/02/2020   PFIZER(Purple Top)SARS-COV-2 Vaccination 04/28/2019, 05/23/2019, 01/12/2020   Pneumococcal Conjugate-13 11/02/2016   Tdap 01/26/2009, 12/20/2018     Objective: Vital Signs: BP 121/85 (BP Location: Left Arm, Patient Position: Sitting, Cuff Size: Large)   Pulse (!) 59   Resp 14   Ht 5\' 9"  (1.753 m)   Wt 275 lb 9.6 oz (125 kg)   LMP 04/29/2016   BMI 40.70 kg/m     Physical Exam Vitals and nursing note reviewed.  Constitutional:      Appearance: She is well-developed.  HENT:     Head: Normocephalic and atraumatic.  Eyes:     Conjunctiva/sclera: Conjunctivae normal.  Cardiovascular:     Rate and Rhythm: Normal rate and regular rhythm.     Heart sounds: Normal heart sounds.  Pulmonary:     Effort: Pulmonary effort is normal.     Breath sounds: Normal breath sounds.  Abdominal:     General: Bowel sounds are normal.     Palpations: Abdomen is soft.  Musculoskeletal:     Cervical back: Normal range of motion.  Lymphadenopathy:     Cervical: No cervical adenopathy.  Skin:    General: Skin is warm and dry.     Capillary Refill: Capillary refill takes less than 2 seconds.     Comments: No malar rash   Neurological:     Mental Status: She is alert and oriented to person, place, and time.  Psychiatric:  Behavior: Behavior normal.      Musculoskeletal Exam: C-spine has good range of motion.  Both shoulders have good range of motion with crepitus.  Some discomfort with range of motion in the left shoulder with tenderness over the left subacromial bursa.  Elbow joints, wrist joints, MCPs, PIPs, DIPs have good range of motion with no synovitis.  Complete fist formation bilaterally.  Bilateral hip replacements have good range of motion with no groin pain.  Painful range of motion of the left knee joint with no warmth or effusion.  Ankle joints have good range of motion with no joint tenderness.  CDAI Exam: CDAI Score: -- Patient Global: --; Provider Global: -- Swollen: --; Tender: -- Joint Exam 06/08/2022   No joint exam has been documented for this visit   There is currently no information documented on the homunculus. Go to the Rheumatology activity and complete the homunculus joint exam.  Investigation: No additional findings.  Imaging: No results found.  Recent Labs: Lab Results  Component Value Date   WBC 6.6 05/29/2022    HGB 13.3 05/29/2022   PLT 221 05/29/2022   NA 140 05/29/2022   K 4.3 05/29/2022   CL 104 05/29/2022   CO2 29 05/29/2022   GLUCOSE 77 05/29/2022   BUN 11 05/29/2022   CREATININE 0.54 05/29/2022   BILITOT 0.4 05/29/2022   ALKPHOS 49 05/11/2018   AST 19 05/29/2022   ALT 10 05/29/2022   PROT 6.5 05/29/2022   ALBUMIN 4.1 05/11/2018   CALCIUM 9.1 05/29/2022   GFRAA 101 07/05/2020    Speciality Comments: PLQ eye exam: 11/29/2020 [facility]. Follow up in 1 year.    Procedures:  No procedures performed Allergies: Monosodium glutamate, Hydromorphone, Lactose intolerance (gi), Levocetirizine, Caffeine, Hydrocodone, and Oxycodone   Assessment / Plan:     Visit Diagnoses: Other systemic lupus erythematosus with other organ involvement (HCC) - History of positive ANA, DS DNA, elevated ESR, arthralgias, rash: Patient was last seen in the office on 05/14/2021.  She has been off of Plaquenil for the last 2 years due to no clinical benefit.  She has not noticed any new or worsening symptoms since discontinuing the use of Plaquenil and had not noticed any clinical benefit while taking it.  Lab work from 05/29/2022 was reviewed today in the office: CBC and CMP within normal limits, protein creatinine ratio within normal limits, lipid panel normal, complements within normal limits, ESR within normal limits, ANA negative, vitamin B12 within normal limits, double-stranded DNA 16-stable.   She has not had any symptoms of Raynaud's phenomenon, oral or nasal ulcerations, sicca symptoms, cervical lymphadenopathy, recent rashes, photosensitivity, or increased fatigue.  She continues to have chronic pain in the left knee joint and is scheduled to have the left knee replaced on 06/19/2022 by Dr. Turner Daniels.  She has no synovitis on examination today.  She is apprehensive to take Plaquenil or any other immunosuppressive agents since she had not noticed any clinical benefit while taking it and is apprehensive of  possible side effects with long-term use.  Discussed signs and symptoms to monitor for closely in regards to a lupus flare.  She will follow-up in the office in 6 months or sooner if needed.  Future orders for the following lab work were placed today to have drawn prior to her scheduled follow-up. - Plan: Protein / creatinine ratio, urine, CBC with Differential/Platelet, COMPLETE METABOLIC PANEL WITH GFR, ANA, Anti-DNA antibody, double-stranded, Sedimentation rate, C3 and C4  High risk  medication use - She discontinued Plaquenil about 2 years ago.  She is not currently taking any immunosuppressive agents.- Plan: CBC with Differential/Platelet, COMPLETE METABOLIC PANEL WITH GFR  Lateral epicondylitis, left elbow: Asymptomatic at this time.   Status post bilateral total hip replacement: Doing well.  Good ROM with no groin pain.   Primary osteoarthritis of both knees - Under the care of Dr. Turner Daniels.  She is scheduled for a left knee total arthroplasty on 06/19/2022. She is not currently taking immunosuppressive agents.   Trapezius muscle spasm: She is not currently experiencing any muscle spasms.  Other fatigue: Chronic, stable.  Vitamin D deficiency: She is taking vitamin D 5000 units daily.  History of migraine  Orders: Orders Placed This Encounter  Procedures   Protein / creatinine ratio, urine   CBC with Differential/Platelet   COMPLETE METABOLIC PANEL WITH GFR   ANA   Anti-DNA antibody, double-stranded   Sedimentation rate   C3 and C4   No orders of the defined types were placed in this encounter.    Follow-Up Instructions: Return in about 6 months (around 12/09/2022) for Systemic lupus erythematosus.   Gearldine Bienenstock, PA-C  Note - This record has been created using Dragon software.  Chart creation errors have been sought, but may not always  have been located. Such creation errors do not reflect on  the standard of medical care.

## 2022-05-27 NOTE — Telephone Encounter (Signed)
Future orders placed 

## 2022-05-28 ENCOUNTER — Telehealth: Payer: Self-pay | Admitting: Family Medicine

## 2022-05-28 DIAGNOSIS — M25662 Stiffness of left knee, not elsewhere classified: Secondary | ICD-10-CM | POA: Diagnosis not present

## 2022-05-28 DIAGNOSIS — M1712 Unilateral primary osteoarthritis, left knee: Secondary | ICD-10-CM | POA: Diagnosis not present

## 2022-05-28 DIAGNOSIS — M6281 Muscle weakness (generalized): Secondary | ICD-10-CM | POA: Diagnosis not present

## 2022-05-28 NOTE — Telephone Encounter (Signed)
Patient has pending surgery, cannot schedule until they receive the clearance form previously faxed

## 2022-05-29 ENCOUNTER — Other Ambulatory Visit: Payer: Self-pay | Admitting: *Deleted

## 2022-05-29 DIAGNOSIS — M3219 Other organ or system involvement in systemic lupus erythematosus: Secondary | ICD-10-CM | POA: Diagnosis not present

## 2022-05-29 DIAGNOSIS — Z79899 Other long term (current) drug therapy: Secondary | ICD-10-CM | POA: Diagnosis not present

## 2022-05-29 DIAGNOSIS — R5383 Other fatigue: Secondary | ICD-10-CM | POA: Diagnosis not present

## 2022-05-29 LAB — CBC WITH DIFFERENTIAL/PLATELET
Absolute Monocytes: 528 cells/uL (ref 200–950)
Basophils Relative: 0.5 %
MCHC: 32.4 g/dL (ref 32.0–36.0)
MPV: 10.1 fL (ref 7.5–12.5)
RBC: 4.65 10*6/uL (ref 3.80–5.10)

## 2022-05-30 LAB — COMPLETE METABOLIC PANEL WITH GFR
ALT: 10 U/L (ref 6–29)
Chloride: 104 mmol/L (ref 98–110)
Creat: 0.54 mg/dL (ref 0.50–1.03)
Glucose, Bld: 77 mg/dL (ref 65–99)
eGFR: 112 mL/min/{1.73_m2} (ref >=60)

## 2022-05-30 LAB — PROTEIN / CREATININE RATIO, URINE
Creatinine, Urine: 100 mg/dL (ref 20–275)
Protein/Creat Ratio: 70 mg/g creat (ref 24–184)
Protein/Creatinine Ratio: 0.07 mg/mg creat (ref 0.024–0.184)

## 2022-05-30 LAB — CBC WITH DIFFERENTIAL/PLATELET
Basophils Absolute: 33 cells/uL (ref 0–200)
Eosinophils Absolute: 112 cells/uL (ref 15–500)

## 2022-06-01 LAB — COMPLETE METABOLIC PANEL WITH GFR
AG Ratio: 1.5 (calc) (ref 1.0–2.5)
AST: 19 U/L (ref 10–35)
Albumin: 3.9 g/dL (ref 3.6–5.1)
Alkaline phosphatase (APISO): 50 U/L (ref 37–153)
BUN: 11 mg/dL (ref 7–25)
CO2: 29 mmol/L (ref 20–32)
Calcium: 9.1 mg/dL (ref 8.6–10.4)
Globulin: 2.6 g/dL (calc) (ref 1.9–3.7)
Potassium: 4.3 mmol/L (ref 3.5–5.3)
Sodium: 140 mmol/L (ref 135–146)
Total Bilirubin: 0.4 mg/dL (ref 0.2–1.2)
Total Protein: 6.5 g/dL (ref 6.1–8.1)

## 2022-06-01 LAB — CBC WITH DIFFERENTIAL/PLATELET
Eosinophils Relative: 1.7 %
HCT: 41 % (ref 35.0–45.0)
Hemoglobin: 13.3 g/dL (ref 11.7–15.5)
Lymphs Abs: 1564 cells/uL (ref 850–3900)
MCH: 28.6 pg (ref 27.0–33.0)
MCV: 88.2 fL (ref 80.0–100.0)
Monocytes Relative: 8 %
Neutro Abs: 4363 cells/uL (ref 1500–7800)
Neutrophils Relative %: 66.1 %
Platelets: 221 10*3/uL (ref 140–400)
RDW: 12.1 % (ref 11.0–15.0)
Total Lymphocyte: 23.7 %
WBC: 6.6 10*3/uL (ref 3.8–10.8)

## 2022-06-01 LAB — C3 AND C4
C3 Complement: 117 mg/dL (ref 83–193)
C4 Complement: 42 mg/dL (ref 15–57)

## 2022-06-01 LAB — ANA: Anti Nuclear Antibody (ANA): NEGATIVE

## 2022-06-01 LAB — LIPID PANEL
Cholesterol: 161 mg/dL (ref <200)
HDL: 68 mg/dL (ref >=50)
LDL Cholesterol (Calc): 82 mg/dL (calc)
Non-HDL Cholesterol (Calc): 93 mg/dL (calc) (ref <130)
Total CHOL/HDL Ratio: 2.4 (calc) (ref <5.0)
Triglycerides: 38 mg/dL (ref <150)

## 2022-06-01 LAB — ANTI-DNA ANTIBODY, DOUBLE-STRANDED: ds DNA Ab: 16 IU/mL — ABNORMAL HIGH

## 2022-06-01 LAB — VITAMIN B12: Vitamin B-12: 730 pg/mL (ref 200–1100)

## 2022-06-01 LAB — PROTEIN / CREATININE RATIO, URINE: Total Protein, Urine: 7 mg/dL (ref 5–24)

## 2022-06-01 LAB — SEDIMENTATION RATE: Sed Rate: 11 mm/h (ref 0–20)

## 2022-06-01 NOTE — Progress Notes (Signed)
CBC and CMP are normal.  Protein creatinine ratio is normal.  LDL normal.  C3-C4 normal, ANA negative, double-stranded DNA low titer positive and stable, B12 normal.  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

## 2022-06-03 ENCOUNTER — Ambulatory Visit (INDEPENDENT_AMBULATORY_CARE_PROVIDER_SITE_OTHER): Payer: Commercial Managed Care - PPO | Admitting: Family Medicine

## 2022-06-03 ENCOUNTER — Other Ambulatory Visit (HOSPITAL_BASED_OUTPATIENT_CLINIC_OR_DEPARTMENT_OTHER): Payer: Self-pay

## 2022-06-03 ENCOUNTER — Encounter: Payer: Self-pay | Admitting: Family Medicine

## 2022-06-03 VITALS — BP 118/86 | HR 77 | Temp 98.2°F | Wt 271.6 lb

## 2022-06-03 DIAGNOSIS — R0982 Postnasal drip: Secondary | ICD-10-CM | POA: Diagnosis not present

## 2022-06-03 DIAGNOSIS — M1711 Unilateral primary osteoarthritis, right knee: Secondary | ICD-10-CM | POA: Diagnosis not present

## 2022-06-03 DIAGNOSIS — J302 Other seasonal allergic rhinitis: Secondary | ICD-10-CM | POA: Diagnosis not present

## 2022-06-03 DIAGNOSIS — Z6839 Body mass index (BMI) 39.0-39.9, adult: Secondary | ICD-10-CM | POA: Diagnosis not present

## 2022-06-03 DIAGNOSIS — R0981 Nasal congestion: Secondary | ICD-10-CM | POA: Diagnosis not present

## 2022-06-03 MED ORDER — FLUTICASONE PROPIONATE 50 MCG/ACT NA SUSP
1.0000 | Freq: Every day | NASAL | 7 refills | Status: DC
Start: 2022-06-03 — End: 2023-10-27
  Filled 2022-06-03: qty 16, 30d supply, fill #0

## 2022-06-03 MED ORDER — FEXOFENADINE HCL 180 MG PO TABS
180.0000 mg | ORAL_TABLET | Freq: Every day | ORAL | 3 refills | Status: AC
Start: 2022-06-03 — End: ?
  Filled 2022-06-03: qty 100, 100d supply, fill #0

## 2022-06-03 NOTE — Telephone Encounter (Signed)
Form completed at [facility] 06/03/22 and to be faxed by CMA.

## 2022-06-03 NOTE — Progress Notes (Signed)
   Established Patient Office Visit   Subjective  Patient ID: Julia Monroe, female    DOB: 16-Jan-1971  Age: 52 y.o. MRN: [id number]  Chief Complaint  Patient presents with   Nasal Congestion    Nasal congestion, went out the house after showering 1-2 weeks ago. Started back on the nasal. Chest congestion cough syrup for a day or 2, drinking herbal tea. Cpugh med that she was given at one of her previous visits caused severe itching     Pt is a 52 yo female with pmh sig for OA, obesity, SLE, migraines, preDM, seasonal allergies, who presents for ongoing concern.  Pt with nasal congestion off and on x a few wks.  Pt states symptoms likely occurred due to pollen/allergies as she was not taking meds regularly.  Since started flonase and allegra.  Endorses some improvement in symptoms.  Denies facial pain/pressure, ear pain, pressure.  In the past Xyzal caused pruritus.  Pt to have R TKR for OA.  Had hip replacement.  Has one step at home.  Pt's daughter and boyfriend will be with her for support.  Patient inquires about weight.  Phentermine stopped suppressing appetite.      ROS Negative unless stated above    Objective:     BP 118/86 (BP Location: Left Arm, Patient Position: Sitting, Cuff Size: Large)   Pulse 77   Temp 98.2 F (36.8 C) (Oral)   Wt 271 lb 9.6 oz (123.2 kg)   LMP 04/29/2016   SpO2 99%   BMI 39.53 kg/m    Physical Exam Constitutional:      General: She is not in acute distress.    Appearance: Normal appearance.  HENT:     Head: Normocephalic and atraumatic.     Nose: Nose normal.     Mouth/Throat:     Mouth: Mucous membranes are moist.  Cardiovascular:     Rate and Rhythm: Normal rate and regular rhythm.     Heart sounds: Normal heart sounds. No murmur heard.    No gallop.  Pulmonary:     Effort: Pulmonary effort is normal. No respiratory distress.     Breath sounds: Normal breath sounds. No wheezing, rhonchi or rales.  Skin:    General: Skin  is warm and dry.  Neurological:     Mental Status: She is alert and oriented to person, place, and time.      No results found for any visits on 06/03/22.    Assessment & Plan:  Seasonal allergies -     Fluticasone Propionate; Place 1 spray into both nostrils daily.  Dispense: 16 g; Refill: 7 -     Fexofenadine HCl; Take 1 tablet (180 mg total) by mouth daily.  Dispense: 90 tablet; Refill: 3  Nasal congestion -Restart antihistamines consistently.  Flonase and Allegra refilled.  For continued/worsening symptoms including pressure/pain in face will send in ABX.  Primary osteoarthritis of right knee -Preop risk assessment form completed and faxed to Ortho.  Post-nasal drainage -     Fluticasone Propionate; Place 1 spray into both nostrils daily.  Dispense: 16 g; Refill: 7  Class II severe obesity with serious comorbidity and body mass index (BMI) 39.0-39.9 in adult, unspecified obesity type (HCC) -Body mass index is 39.53 kg/m. -d/c phentermine is no longer effective -Discussed various weight loss medication options.  Patient to check for insurance company about what is covered. -Consider referral to weight management  Follow-up as needed  Deeann Saint, MD

## 2022-06-04 ENCOUNTER — Other Ambulatory Visit (HOSPITAL_BASED_OUTPATIENT_CLINIC_OR_DEPARTMENT_OTHER): Payer: Self-pay

## 2022-06-04 NOTE — Telephone Encounter (Signed)
Faxed forms to both numbers provided, fax successful. Fax confirmation received.

## 2022-06-04 NOTE — Telephone Encounter (Signed)
Surgical Clearance has still not been received.  They are asking that it be resent to the following 2 fax numbers, just in case:  [phone]  [phone]

## 2022-06-08 ENCOUNTER — Encounter: Payer: Self-pay | Admitting: Physician Assistant

## 2022-06-08 ENCOUNTER — Other Ambulatory Visit: Payer: Self-pay | Admitting: Orthopedic Surgery

## 2022-06-08 ENCOUNTER — Ambulatory Visit: Payer: Commercial Managed Care - PPO | Attending: Physician Assistant | Admitting: Physician Assistant

## 2022-06-08 VITALS — BP 121/85 | HR 59 | Resp 14 | Ht 69.0 in | Wt 275.6 lb

## 2022-06-08 DIAGNOSIS — M3219 Other organ or system involvement in systemic lupus erythematosus: Secondary | ICD-10-CM

## 2022-06-08 DIAGNOSIS — E559 Vitamin D deficiency, unspecified: Secondary | ICD-10-CM

## 2022-06-08 DIAGNOSIS — Z96643 Presence of artificial hip joint, bilateral: Secondary | ICD-10-CM | POA: Diagnosis not present

## 2022-06-08 DIAGNOSIS — M7712 Lateral epicondylitis, left elbow: Secondary | ICD-10-CM

## 2022-06-08 DIAGNOSIS — M62838 Other muscle spasm: Secondary | ICD-10-CM

## 2022-06-08 DIAGNOSIS — R5383 Other fatigue: Secondary | ICD-10-CM

## 2022-06-08 DIAGNOSIS — Z8669 Personal history of other diseases of the nervous system and sense organs: Secondary | ICD-10-CM

## 2022-06-08 DIAGNOSIS — M170 Bilateral primary osteoarthritis of knee: Secondary | ICD-10-CM | POA: Diagnosis not present

## 2022-06-08 DIAGNOSIS — Z79899 Other long term (current) drug therapy: Secondary | ICD-10-CM | POA: Diagnosis not present

## 2022-06-08 NOTE — Patient Instructions (Signed)

## 2022-06-12 ENCOUNTER — Encounter (HOSPITAL_BASED_OUTPATIENT_CLINIC_OR_DEPARTMENT_OTHER): Payer: Self-pay

## 2022-06-12 ENCOUNTER — Other Ambulatory Visit (HOSPITAL_BASED_OUTPATIENT_CLINIC_OR_DEPARTMENT_OTHER): Payer: Self-pay

## 2022-06-12 DIAGNOSIS — E569 Vitamin deficiency, unspecified: Secondary | ICD-10-CM | POA: Diagnosis not present

## 2022-06-12 DIAGNOSIS — R5383 Other fatigue: Secondary | ICD-10-CM | POA: Diagnosis not present

## 2022-06-12 DIAGNOSIS — Z1321 Encounter for screening for nutritional disorder: Secondary | ICD-10-CM | POA: Diagnosis not present

## 2022-06-12 DIAGNOSIS — E669 Obesity, unspecified: Secondary | ICD-10-CM | POA: Diagnosis not present

## 2022-06-12 DIAGNOSIS — Z9884 Bariatric surgery status: Secondary | ICD-10-CM | POA: Diagnosis not present

## 2022-06-12 MED ORDER — WEGOVY 0.25 MG/0.5ML ~~LOC~~ SOAJ
0.2500 mg | SUBCUTANEOUS | 0 refills | Status: DC
  Filled 2022-06-12: qty 2, 28d supply, fill #0

## 2022-06-13 ENCOUNTER — Other Ambulatory Visit (HOSPITAL_BASED_OUTPATIENT_CLINIC_OR_DEPARTMENT_OTHER): Payer: Self-pay

## 2022-06-15 ENCOUNTER — Other Ambulatory Visit (HOSPITAL_BASED_OUTPATIENT_CLINIC_OR_DEPARTMENT_OTHER): Payer: Self-pay

## 2022-06-24 ENCOUNTER — Encounter (HOSPITAL_COMMUNITY): Payer: Self-pay

## 2022-06-25 ENCOUNTER — Telehealth: Payer: Self-pay | Admitting: Family Medicine

## 2022-06-25 NOTE — Telephone Encounter (Signed)
Pt is calling and would like to come to our office to have labs ordered by dr Andrey Campanile. Pt has lab order. Please advise

## 2022-06-29 NOTE — Telephone Encounter (Signed)
Pt has been sch for 06-30-2022

## 2022-06-29 NOTE — Telephone Encounter (Signed)
Okay 

## 2022-06-30 ENCOUNTER — Other Ambulatory Visit: Payer: Commercial Managed Care - PPO

## 2022-06-30 DIAGNOSIS — R5383 Other fatigue: Secondary | ICD-10-CM | POA: Diagnosis not present

## 2022-06-30 DIAGNOSIS — Z1321 Encounter for screening for nutritional disorder: Secondary | ICD-10-CM | POA: Diagnosis not present

## 2022-06-30 DIAGNOSIS — E569 Vitamin deficiency, unspecified: Secondary | ICD-10-CM | POA: Diagnosis not present

## 2022-07-06 NOTE — Progress Notes (Addendum)
Anesthesia Review:  PCP: Abbe Amsterdam - LVMM for Julia Monroe at [facility] office and requested clearance on 07/10/22.  Clearance on chart dated 06/03/2022.  Cardiologist : none  Chest x-ray : 07/10/22- 2 view  EKG : 07/10/22  Echo : Stress test: Cardiac Cath :  Activity level: can do a flight of stairs wtihout difficutly  Sleep Study/ CPAP :none  Fasting Blood Sugar :      / Checks Blood Sugar -- times a day:   Blood Thinner/ Instructions /Last Dose: ASA / Instructions/ Last Dose :    Reginal Lutes- has never taken per pt at preop of 07/10/22.    Blood Product Refusal - PT is not a Jehovah Witness but refuses Blood.   Faxed to DR Turner Daniels- confirmation on chart  Faxed to [facility]- confirmation on chart  IN FYI  IN Allergies  ORder in epic   AT preop on 07/10/22 pt did not want Ensure presurgery drink.  Pt reports havin with past surgery and did not like.  Instructed pt to drink some liquids listed on clear liquid list am of surgery.  PT voiced understanding.

## 2022-07-07 NOTE — Patient Instructions (Addendum)
SURGICAL WAITING ROOM VISITATION  Patients having surgery or a procedure may have no more than 2 support people in the waiting area - these visitors may rotate.    Children under the age of 57 must have an adult with them who is not the patient.  Due to an increase in RSV and influenza rates and associated hospitalizations, children ages 31 and under may not visit patients in [facility] hospitals.  If the patient needs to stay at the hospital during part of their recovery, the visitor guidelines for inpatient rooms apply. Pre-op nurse will coordinate an appropriate time for 1 support person to accompany patient in pre-op.  This support person may not rotate.    Please refer to the [facility] website for the visitor guidelines for Inpatients (after your surgery is over and you are in a regular room).       Your procedure is scheduled on:  07/20/22    Report to [facility] Main Entrance    Report to admitting at   [phone]   Call this number if you have problems the morning of surgery [phone]   Do not eat food :After Midnight.   After Midnight you may have the following liquids until _ 0645_ ____ Am  DAY OF SURGERY  Water Non-Citrus Juices (without pulp, NO RED-Apple, White grape, White cranberry) Black Coffee (NO MILK/CREAM OR CREAMERS, sugar ok)  Clear Tea (NO MILK/CREAM OR CREAMERS, sugar ok) regular and decaf                             Plain Jell-O (NO RED)                                           Fruit ices (not with fruit pulp, NO RED)                                     Popsicles (NO RED)                                                               Sports drinks like Gatorade (NO RED)          The day of surgery:  Drink ONE (1) Pre-Surgery Clear Ensure or G2 at  0645 AM the morning of surgery. Drink in one sitting. Do not sip.  This drink was given to you during your hospital  pre-op appointment visit. Nothing else to drink after completing the   Pre-Surgery Clear Ensure or G2.          If you have questions, please contact your surgeon's office.       Oral Hygiene is also important to reduce your risk of infection.                                    Remember - BRUSH YOUR TEETH THE MORNING OF SURGERY WITH YOUR REGULAR TOOTHPASTE  DENTURES WILL BE REMOVED PRIOR TO SURGERY PLEASE DO NOT  APPLY "Poly grip" OR ADHESIVES!!!   Do NOT smoke after Midnight   Take these medicines the morning of surgery with A SIP OF WATER:   allegra , flonase if needed   DO NOT TAKE ANY ORAL DIABETIC MEDICATIONS DAY OF YOUR SURGERY  Bring CPAP mask and tubing day of surgery.                              You may not have any metal on your body including hair pins, jewelry, and body piercing             Do not wear make-up, lotions, powders, perfumes/cologne, or deodorant  Do not wear nail polish including gel and S&S, artificial/acrylic nails, or any other type of covering on natural nails including finger and toenails. If you have artificial nails, gel coating, etc. that needs to be removed by a nail salon please have this removed prior to surgery or surgery may need to be canceled/ delayed if the surgeon/ anesthesia feels like they are unable to be safely monitored.   Do not shave  48 hours prior to surgery.               Men may shave face and neck.   Do not bring valuables to the hospital. [facility] IS NOT             RESPONSIBLE   FOR VALUABLES.   Contacts, glasses, dentures or bridgework may not be worn into surgery.   Bring small overnight bag day of surgery.   DO NOT BRING YOUR HOME MEDICATIONS TO THE HOSPITAL. PHARMACY WILL DISPENSE MEDICATIONS LISTED ON YOUR MEDICATION LIST TO YOU DURING YOUR ADMISSION IN THE HOSPITAL!    Patients discharged on the day of surgery will not be allowed to drive home.  Someone NEEDS to stay with you for the first 24 hours after anesthesia.   Special Instructions: Bring a copy of your healthcare power  of attorney and living will documents the day of surgery if you haven't scanned them before.              Please read over the following fact sheets you were given: IF YOU HAVE QUESTIONS ABOUT YOUR PRE-OP INSTRUCTIONS PLEASE CALL [phone]   If you received a COVID test during your pre-op visit  it is requested that you wear a mask when out in public, stay away from anyone that may not be feeling well and notify your surgeon if you develop symptoms. If you test positive for Covid or have been in contact with anyone that has tested positive in the last 10 days please notify you surgeon.      Pre-operative 5 CHG Bath Instructions   You can play a key role in reducing the risk of infection after surgery. Your skin needs to be as free of germs as possible. You can reduce the number of germs on your skin by washing with CHG (chlorhexidine gluconate) soap before surgery. CHG is an antiseptic soap that kills germs and continues to kill germs even after washing.   DO NOT use if you have an allergy to chlorhexidine/CHG or antibacterial soaps. If your skin becomes reddened or irritated, stop using the CHG and notify one of our RNs at [phone].   Please shower with the CHG soap starting 4 days before surgery using the following schedule:     Please keep in mind the following:  DO NOT  shave, including legs and underarms, starting the day of your first shower.   You may shave your face at any point before/day of surgery.  Place clean sheets on your bed the day you start using CHG soap. Use a clean washcloth (not used since being washed) for each shower. DO NOT sleep with pets once you start using the CHG.   CHG Shower Instructions:  If you choose to wash your hair and private area, wash first with your normal shampoo/soap.  After you use shampoo/soap, rinse your hair and body thoroughly to remove shampoo/soap residue.  Turn the water OFF and apply about 3 tablespoons (45 ml) of CHG soap to a  CLEAN washcloth.  Apply CHG soap ONLY FROM YOUR NECK DOWN TO YOUR TOES (washing for 3-5 minutes)  DO NOT use CHG soap on face, private areas, open wounds, or sores.  Pay special attention to the area where your surgery is being performed.  If you are having back surgery, having someone wash your back for you may be helpful. Wait 2 minutes after CHG soap is applied, then you may rinse off the CHG soap.  Pat dry with a clean towel  Put on clean clothes/pajamas   If you choose to wear lotion, please use ONLY the CHG-compatible lotions on the back of this paper.     Additional instructions for the day of surgery: DO NOT APPLY any lotions, deodorants, cologne, or perfumes.   Put on clean/comfortable clothes.  Brush your teeth.  Ask your nurse before applying any prescription medications to the skin.      CHG Compatible Lotions   Aveeno Moisturizing lotion  Cetaphil Moisturizing Cream  Cetaphil Moisturizing Lotion  Clairol Herbal Essence Moisturizing Lotion, Dry Skin  Clairol Herbal Essence Moisturizing Lotion, Extra Dry Skin  Clairol Herbal Essence Moisturizing Lotion, Normal Skin  Curel Age Defying Therapeutic Moisturizing Lotion with Alpha Hydroxy  Curel Extreme Care Body Lotion  Curel Soothing Hands Moisturizing Hand Lotion  Curel Therapeutic Moisturizing Cream, Fragrance-Free  Curel Therapeutic Moisturizing Lotion, Fragrance-Free  Curel Therapeutic Moisturizing Lotion, Original Formula  Eucerin Daily Replenishing Lotion  Eucerin Dry Skin Therapy Plus Alpha Hydroxy Crme  Eucerin Dry Skin Therapy Plus Alpha Hydroxy Lotion  Eucerin Original Crme  Eucerin Original Lotion  Eucerin Plus Crme Eucerin Plus Lotion  Eucerin TriLipid Replenishing Lotion  Keri Anti-Bacterial Hand Lotion  Keri Deep Conditioning Original Lotion Dry Skin Formula Softly Scented  Keri Deep Conditioning Original Lotion, Fragrance Free Sensitive Skin Formula  Keri Lotion Fast Absorbing Fragrance Free  Sensitive Skin Formula  Keri Lotion Fast Absorbing Softly Scented Dry Skin Formula  Keri Original Lotion  Keri Skin Renewal Lotion Keri Silky Smooth Lotion  Keri Silky Smooth Sensitive Skin Lotion  Nivea Body Creamy Conditioning Oil  Nivea Body Extra Enriched Teacher, adult education Moisturizing Lotion Nivea Crme  Nivea Skin Firming Lotion  NutraDerm 30 Skin Lotion  NutraDerm Skin Lotion  NutraDerm Therapeutic Skin Cream  NutraDerm Therapeutic Skin Lotion  ProShield Protective Hand Cream  Provon moisturizing lotion

## 2022-07-10 ENCOUNTER — Ambulatory Visit (HOSPITAL_COMMUNITY)
Admission: RE | Admit: 2022-07-10 | Discharge: 2022-07-10 | Disposition: A | Discharge status: Home / Self Care | Payer: Commercial Managed Care - PPO | Source: Ambulatory Visit | Attending: Orthopedic Surgery | Admitting: Orthopedic Surgery

## 2022-07-10 ENCOUNTER — Other Ambulatory Visit: Payer: Self-pay

## 2022-07-10 ENCOUNTER — Encounter (HOSPITAL_COMMUNITY): Payer: Self-pay

## 2022-07-10 ENCOUNTER — Encounter (HOSPITAL_COMMUNITY)
Admission: RE | Admit: 2022-07-10 | Discharge: 2022-07-10 | Disposition: A | Discharge status: Home / Self Care | Payer: Commercial Managed Care - PPO | Source: Ambulatory Visit | Attending: Orthopedic Surgery | Admitting: Orthopedic Surgery

## 2022-07-10 VITALS — BP 140/77 | HR 66 | Temp 98.5°F | Resp 16 | Ht 69.5 in | Wt 234.0 lb

## 2022-07-10 DIAGNOSIS — K449 Diaphragmatic hernia without obstruction or gangrene: Secondary | ICD-10-CM | POA: Diagnosis not present

## 2022-07-10 DIAGNOSIS — M1712 Unilateral primary osteoarthritis, left knee: Secondary | ICD-10-CM | POA: Diagnosis present

## 2022-07-10 DIAGNOSIS — Z01818 Encounter for other preprocedural examination: Secondary | ICD-10-CM | POA: Diagnosis not present

## 2022-07-10 LAB — CBC
HCT: 40 % (ref 36.0–46.0)
Hemoglobin: 12.9 g/dL (ref 12.0–15.0)
MCH: 28.7 pg (ref 26.0–34.0)
MCHC: 32.3 g/dL (ref 30.0–36.0)
MCV: 88.9 fL (ref 80.0–100.0)
Platelets: 208 10*3/uL (ref 150–400)
RBC: 4.5 MIL/uL (ref 3.87–5.11)
RDW: 13.6 % (ref 11.5–15.5)
WBC: 7.2 10*3/uL (ref 4.0–10.5)
nRBC: 0 % (ref 0.0–0.2)

## 2022-07-10 LAB — BASIC METABOLIC PANEL
Anion gap: 7 (ref 5–15)
BUN: 16 mg/dL (ref 6–20)
CO2: 25 mmol/L (ref 22–32)
Calcium: 8.9 mg/dL (ref 8.9–10.3)
Chloride: 106 mmol/L (ref 98–111)
Creatinine, Ser: 0.6 mg/dL (ref 0.44–1.00)
GFR, Estimated: >60 mL/min (ref >60)
Glucose, Bld: 77 mg/dL (ref 70–99)
Potassium: 3.7 mmol/L (ref 3.5–5.1)
Sodium: 138 mmol/L (ref 135–145)

## 2022-07-10 LAB — SURGICAL PCR SCREEN
MRSA, PCR: NEGATIVE
Staphylococcus aureus: NEGATIVE

## 2022-07-10 LAB — NO BLOOD PRODUCTS

## 2022-07-10 NOTE — H&P (Signed)
TOTAL KNEE ADMISSION H&P  Patient is being admitted for left total knee arthroplasty.  Subjective:  Chief Complaint:left knee pain.  HPI: Julia Monroe, 52 y.o. female, has a history of pain and functional disability in the left knee due to arthritis and has failed non-surgical conservative treatments for greater than 12 weeks to includeNSAID's and/or analgesics, corticosteriod injections, viscosupplementation injections, flexibility and strengthening excercises, weight reduction as appropriate, and activity modification.  Onset of symptoms was gradual, starting 5 years ago with gradually worsening course since that time. The patient noted no past surgery on the left knee(s).  Patient currently rates pain in the left knee(s) at 10 out of 10 with activity. Patient has night pain, worsening of pain with activity and weight bearing, pain that interferes with activities of daily living, pain with passive range of motion, crepitus, and joint swelling.  Patient has evidence of subchondral sclerosis, periarticular osteophytes, joint subluxation, and joint space narrowing by imaging studies.  There is no active infection.  Patient Active Problem List   Diagnosis Date Noted   Degenerative arthritis of left knee 07/10/2022   H/O gastric sleeve 02/12/2022   History of total hip arthroplasty, right 11/21/2018   Prediabetes 03/21/2018   SLE (systemic lupus erythematosus) (HCC) 03/21/2018   Obesity 03/21/2018   History of migraine 08/27/2017   Class 3 severe obesity due to excess calories without serious comorbidity with body mass index (BMI) of 50.0 to 59.9 in adult [facility]) 01/27/2017   Status post bilateral total hip replacement 10/27/2016   Hyperglycemia 08/25/2016   Leukocytosis 08/13/2016   Status post laparoscopic hysterectomy 07/06/2016   Primary osteoarthritis of both knees 04/29/2016   ANA positive 04/29/2016   Primary osteoarthritis of right hip 07/10/2011   Past Medical History:   Diagnosis Date   Arthritis    osteoarthritis   Complication of anesthesia    bottom of feet itching   Elevated hemoglobin A1c 2018   6.1   GERD (gastroesophageal reflux disease)    Herpes simplex type II infection 2020   Lupus (HCC) 2018   Dr.Deveshwar, currently in normal range    Past Surgical History:  Procedure Laterality Date   ABDOMINAL HYSTERECTOMY     CESAREAN SECTION  2000   CYSTOSCOPY N/A 07/06/2016   Procedure: CYSTOSCOPY;  Surgeon: Patton Salles, MD;  Location: [facility];  Service: Gynecology;  Laterality: N/A;   ingrown toenail Left 07/28/2019   JOINT REPLACEMENT  L   Left hip replacement   LAPAROSCOPIC GASTRIC SLEEVE RESECTION N/A 03/21/2018   Procedure: LAPAROSCOPIC GASTRIC SLEEVE RESECTION, UPPER ENDO, ERAS Pathway;  Surgeon: Gaynelle Adu, MD;  Location: [facility];  Service: General;  Laterality: N/A;   OOPHORECTOMY  2007   Rt.oophorectomy    OVARIAN CYST SURGERY  ? and 10   rt removed,cyst off lft   TOTAL HIP ARTHROPLASTY  07/06/2011   Procedure: TOTAL HIP ARTHROPLASTY;  Surgeon: Nestor Lewandowsky, MD;  Location: [facility] OR;  Service: Orthopedics;  Laterality: Left;  DEPUY/PINNACLE   TOTAL HIP ARTHROPLASTY Right 11/21/2018   Procedure: RIGHT TOTAL HIP ARTHROPLASTY ANTERIOR APPROACH;  Surgeon: Gean Birchwood, MD;  Location: [facility];  Service: Orthopedics;  Laterality: Right;   TOTAL LAPAROSCOPIC HYSTERECTOMY WITH SALPINGECTOMY Left 07/06/2016   Procedure: HYSTERECTOMY TOTAL LAPAROSCOPIC WITH Left SALPINGECTOMY, Suture Left Side Wall Laceration;  Surgeon: Patton Salles, MD;  Location: [facility];  Service: Gynecology;  Laterality: Left;    No current facility-administered medications for this encounter.  Current Outpatient Medications  Medication Sig Dispense Refill Last Dose   acetaminophen (TYLENOL) 500 MG tablet Take 500 mg by mouth every 6 (six) hours as needed for moderate pain.      CASCARA SAGRADA PO Take 1 tablet by mouth daily.      Cholecalciferol  (VITAMIN D) 125 MCG (5000 UT) CAPS Take 5,000 Units by mouth 3 (three) times a week.      diclofenac (VOLTAREN) 50 MG EC tablet Take 1 tablet (50 mg total) by mouth as needed for migraine. 20 tablet 0    diclofenac Sodium (VOLTAREN) 1 % GEL APPLY 2 TO 4 GRAMS TO AFFECTED JOINT UP TO 4 TIMES DAILY (Patient taking differently: Apply 2 g topically 4 (four) times daily as needed (pain).) 400 g 2    fexofenadine (ALLEGRA) 180 MG tablet Take 1 tablet (180 mg total) by mouth daily. 100 tablet 3    fluticasone (FLONASE) 50 MCG/ACT nasal spray Place 1 spray into both nostrils daily. (Patient taking differently: Place 1 spray into both nostrils daily as needed for allergies.) 16 g 7    Menthol, Topical Analgesic, (BIOFREEZE EX) Apply 1 application topically 2 (two) times daily as needed (pain).      Multiple Vitamins-Minerals (BARIATRIC MULTIVITAMINS/IRON PO) Take 1 tablet by mouth 2 (two) times daily.      Tetrahydrozoline HCl (VISINE OP) Place 1 drop into both eyes 3 (three) times daily as needed (for irritated/dry eyes.).      traMADol (ULTRAM) 50 MG tablet Take 1 tablet (50 mg total) by mouth 3 (three) times daily as needed. 90 tablet 2    Semaglutide-Weight Management (WEGOVY) 0.25 MG/0.5ML SOAJ Inject 0.25 mg into the skin once a week. (Patient not taking: Reported on 07/07/2022) 2 mL 0 Not Taking   Allergies  Allergen Reactions   Monosodium Glutamate Other (See Comments)    Loses vision, headache/migraine   Hydromorphone     Makes patient "Loopy" and dreams terrible things   Lactose Intolerance (Gi) Other (See Comments)    Gi upset   Levocetirizine Itching   Caffeine Other (See Comments)    Severe headache    Hydrocodone Other (See Comments)    Headaches/migraines    Oxycodone Other (See Comments)    Headache/Migraine    Social History   Tobacco Use   Smoking status: Never    Passive exposure: Never   Smokeless tobacco: Never  Substance Use Topics   Alcohol use: No    Alcohol/week: 0.0  standard drinks of alcohol    Family History  Problem Relation Age of Onset   Osteoarthritis Mother    Osteoarthritis Brother    Hypertension Brother    Diabetes Brother    Osteoarthritis Maternal Grandmother    Arthritis Maternal Grandmother      Review of Systems  Constitutional: Negative.   HENT: Negative.    Eyes: Negative.   Respiratory: Negative.    Cardiovascular: Negative.   Gastrointestinal: Negative.   Endocrine: Negative.   Genitourinary: Negative.   Musculoskeletal:  Positive for arthralgias.  Skin: Negative.   Allergic/Immunologic: Negative.   Neurological: Negative.   Hematological: Negative.   Psychiatric/Behavioral: Negative.      Objective:  Physical Exam Constitutional:      Appearance: Normal appearance. She is normal weight.  HENT:     Head: Normocephalic and atraumatic.     Nose: Nose normal.  Eyes:     Pupils: Pupils are equal, round, and reactive to light.  Cardiovascular:  Pulses: Normal pulses.  Pulmonary:     Effort: Pulmonary effort is normal.  Musculoskeletal:        General: Swelling, tenderness and deformity present.     Cervical back: Normal range of motion and neck supple.     Comments: Tender along the lateral joint line of the left knee obvious valgus deformity range of motion is from 5/120.  Collateral ligaments are stable but she is a little tender along the MCL.  Skin is intact neurovascular intact distally toes are pink and well perfused.  Skin:    General: Skin is warm and dry.  Neurological:     General: No focal deficit present.     Mental Status: She is alert and oriented to person, place, and time. Mental status is at baseline.  Psychiatric:        Mood and Affect: Mood normal.        Behavior: Behavior normal.        Thought Content: Thought content normal.        Judgment: Judgment normal.     Vital signs in last 24 hours:    Labs:   Estimated body mass index is 40.7 kg/m as calculated from the  following:   Height as of 06/08/22: 5\' 9"  (1.753 m).   Weight as of 06/08/22: 125 kg.   Imaging Review Plain radiographs demonstrate  AP Rosenberg lateral and sunrise x-rays show end-stage arthritis of the left knee with erosion of the lateral femoral condyle 1-2 mm lateral subluxation of the tibia beneath the femur and sclerosis of the bone laterally to the femur and the tibia.  The AP and Zoila Shutter of the right knee now show bone-on-bone arthritic changes to the medial compartment with peripheral osteophytes 1-2 mm lateral subluxation of tibias beneath femur 5 varus deformity.    Assessment/Plan:  End stage arthritis, left knee   The patient history, physical examination, clinical judgment of the provider and imaging studies are consistent with end stage degenerative joint disease of the left knee(s) and total knee arthroplasty is deemed medically necessary. The treatment options including medical management, injection therapy arthroscopy and arthroplasty were discussed at length. The risks and benefits of total knee arthroplasty were presented and reviewed. The risks due to aseptic loosening, infection, stiffness, patella tracking problems, thromboembolic complications and other imponderables were discussed. The patient acknowledged the explanation, agreed to proceed with the plan and consent was signed. Patient is being admitted for inpatient treatment for surgery, pain control, PT, OT, prophylactic antibiotics, VTE prophylaxis, progressive ambulation and ADL's and discharge planning. The patient is planning to be discharged home with home health services     Patient's anticipated LOS is less than 2 midnights, meeting these requirements: - Younger than 62 - Lives within 1 hour of care - Has a competent adult at home to recover with post-op recover - NO history of  - Chronic pain requiring opiods  - Diabetes  - Coronary Artery Disease  - Heart failure  - Heart attack  - Stroke  -  DVT/VTE  - Cardiac arrhythmia  - Respiratory Failure/COPD  - Renal failure  - Anemia  - Advanced Liver disease

## 2022-07-13 DIAGNOSIS — M1712 Unilateral primary osteoarthritis, left knee: Secondary | ICD-10-CM | POA: Diagnosis not present

## 2022-07-14 ENCOUNTER — Other Ambulatory Visit (HOSPITAL_BASED_OUTPATIENT_CLINIC_OR_DEPARTMENT_OTHER): Payer: Self-pay

## 2022-07-14 MED ORDER — TRAMADOL HCL 50 MG PO TABS
50.0000 mg | ORAL_TABLET | Freq: Three times a day (TID) | ORAL | 2 refills | Status: DC | PRN
  Filled 2022-07-14: qty 90, 30d supply, fill #0
  Filled 2022-08-18: qty 90, 30d supply, fill #1
  Filled 2022-09-15: qty 90, 30d supply, fill #2

## 2022-07-19 NOTE — Anesthesia Preprocedure Evaluation (Signed)
Anesthesia Evaluation  Patient identified by MRN, date of birth, ID band Patient awake    Reviewed: Allergy & Precautions, NPO status , Patient's Chart, lab work & pertinent test results  History of Anesthesia Complications (+) history of anesthetic complications (itching feet)  Airway Mallampati: II  TM Distance: >3 FB Neck ROM: Full   Comment: Previous grade I view with MAC 4 Dental  (+) Dental Advisory Given   Pulmonary neg pulmonary ROS   Pulmonary exam normal breath sounds clear to auscultation       Cardiovascular negative cardio ROS  Rhythm:Regular Rate:Normal     Neuro/Psych  Headaches, neg Seizures    GI/Hepatic Neg liver ROS,GERD  ,,H/o gastric sleeve 02/2018   Endo/Other  neg diabetes  Pre-diabetes  Renal/GU negative Renal ROS     Musculoskeletal  (+) Arthritis , Osteoarthritis,    Abdominal  (+) + obese  Peds  Hematology negative hematology ROS (+)   Anesthesia Other Findings Lupus  Has not taken Wegovy  Platelets 208 on 07/10/2022  Patient had signed a blood refusal form. She is not a Jehovah's Witness. When asked if she needed blood to save her life, she responded that she would rather have blood than die. Her daughter was present for this conversation. Discussed with nurse, Alexa.  Reproductive/Obstetrics                             Anesthesia Physical Anesthesia Plan  ASA: 3  Anesthesia Plan: MAC, Spinal and Regional   Post-op Pain Management: Regional block* and Tylenol PO (pre-op)*   Induction: Intravenous  PONV Risk Score and Plan: 2 and Ondansetron, Dexamethasone, Propofol infusion and Treatment may vary due to age or medical condition  Airway Management Planned: Natural Airway and Simple Face Mask  Additional Equipment:   Intra-op Plan:   Post-operative Plan:   Informed Consent: I have reviewed the patients History and Physical, chart, labs and  discussed the procedure including the risks, benefits and alternatives for the proposed anesthesia with the patient or authorized representative who has indicated his/her understanding and acceptance.     Dental advisory given  Plan Discussed with: CRNA and Anesthesiologist  Anesthesia Plan Comments: (Discussed potential risks of nerve blocks including, but not limited to, infection, bleeding, nerve damage, seizures, pneumothorax, respiratory depression, and potential failure of the block. Alternatives to nerve blocks discussed. All questions answered.  I have discussed risks of neuraxial anesthesia including but not limited to infection, bleeding, nerve injury, back pain, headache, seizures, and failure of block. Patient denies bleeding disorders and is not currently anticoagulated. Labs have been reviewed. Risks and benefits discussed. All patient's questions answered.   Discussed with patient risks of MAC including, but not limited to, minor pain or discomfort, hearing people in the room, and possible need for backup general anesthesia. Risks for general anesthesia also discussed including, but not limited to, sore throat, hoarse voice, chipped/damaged teeth, injury to vocal cords, nausea and vomiting, allergic reactions, lung infection, heart attack, stroke, and death. All questions answered. )       Anesthesia Quick Evaluation

## 2022-07-20 ENCOUNTER — Other Ambulatory Visit: Payer: Self-pay

## 2022-07-20 ENCOUNTER — Encounter (HOSPITAL_COMMUNITY): Payer: Self-pay | Admitting: Orthopedic Surgery

## 2022-07-20 ENCOUNTER — Ambulatory Visit (HOSPITAL_BASED_OUTPATIENT_CLINIC_OR_DEPARTMENT_OTHER): Payer: Commercial Managed Care - PPO | Admitting: Anesthesiology

## 2022-07-20 ENCOUNTER — Ambulatory Visit (HOSPITAL_COMMUNITY)
Admission: RE | Admit: 2022-07-20 | Discharge: 2022-07-20 | Disposition: A | Discharge status: Home / Self Care | Payer: Commercial Managed Care - PPO | Attending: Orthopedic Surgery | Admitting: Orthopedic Surgery

## 2022-07-20 ENCOUNTER — Ambulatory Visit (HOSPITAL_COMMUNITY): Payer: Commercial Managed Care - PPO | Admitting: Physician Assistant

## 2022-07-20 ENCOUNTER — Other Ambulatory Visit (HOSPITAL_BASED_OUTPATIENT_CLINIC_OR_DEPARTMENT_OTHER): Payer: Self-pay

## 2022-07-20 ENCOUNTER — Encounter (HOSPITAL_COMMUNITY)
Admission: RE | Disposition: A | Discharge status: Home / Self Care | Payer: Self-pay | Source: Home / Self Care | Attending: Orthopedic Surgery

## 2022-07-20 DIAGNOSIS — M1712 Unilateral primary osteoarthritis, left knee: Secondary | ICD-10-CM | POA: Diagnosis not present

## 2022-07-20 DIAGNOSIS — K219 Gastro-esophageal reflux disease without esophagitis: Secondary | ICD-10-CM | POA: Insufficient documentation

## 2022-07-20 DIAGNOSIS — M170 Bilateral primary osteoarthritis of knee: Secondary | ICD-10-CM | POA: Diagnosis present

## 2022-07-20 DIAGNOSIS — Z6834 Body mass index (BMI) 34.0-34.9, adult: Secondary | ICD-10-CM | POA: Diagnosis not present

## 2022-07-20 DIAGNOSIS — G8918 Other acute postprocedural pain: Secondary | ICD-10-CM | POA: Diagnosis not present

## 2022-07-20 DIAGNOSIS — M329 Systemic lupus erythematosus, unspecified: Secondary | ICD-10-CM | POA: Diagnosis not present

## 2022-07-20 DIAGNOSIS — Z6841 Body Mass Index (BMI) 40.0 and over, adult: Secondary | ICD-10-CM | POA: Diagnosis not present

## 2022-07-20 DIAGNOSIS — E669 Obesity, unspecified: Secondary | ICD-10-CM | POA: Diagnosis not present

## 2022-07-20 DIAGNOSIS — Z96652 Presence of left artificial knee joint: Secondary | ICD-10-CM | POA: Diagnosis not present

## 2022-07-20 DIAGNOSIS — Z01818 Encounter for other preprocedural examination: Secondary | ICD-10-CM

## 2022-07-20 HISTORY — PX: TOTAL KNEE ARTHROPLASTY: SHX125

## 2022-07-20 SURGERY — ARTHROPLASTY, KNEE, TOTAL
Anesthesia: Monitor Anesthesia Care | Site: Knee | Laterality: Left

## 2022-07-20 MED ORDER — POVIDONE-IODINE 10 % EX SWAB: 2.0000 | Freq: Once | CUTANEOUS | Status: DC

## 2022-07-20 MED ORDER — SODIUM CHLORIDE (PF) 0.9 % IJ SOLN
INTRAMUSCULAR | Status: AC
  Filled 2022-07-20: qty 50

## 2022-07-20 MED ORDER — TRANEXAMIC ACID 1000 MG/10ML IV SOLN
INTRAVENOUS | Status: DC | PRN
  Administered 2022-07-20: 2000 mg via TOPICAL

## 2022-07-20 MED ORDER — METHOCARBAMOL 500 MG PO TABS: 500.0000 mg | ORAL_TABLET | Freq: Four times a day (QID) | ORAL | Status: DC | PRN

## 2022-07-20 MED ORDER — OXYCODONE HCL 5 MG PO TABS: 5.0000 mg | ORAL_TABLET | ORAL | Status: DC | PRN

## 2022-07-20 MED ORDER — TRANEXAMIC ACID 1000 MG/10ML IV SOLN
2000.0000 mg | INTRAVENOUS | Status: DC
  Filled 2022-07-20: qty 20

## 2022-07-20 MED ORDER — CEFAZOLIN SODIUM-DEXTROSE 2-4 GM/100ML-% IV SOLN
2.0000 g | INTRAVENOUS | Status: AC
  Administered 2022-07-20: 2 g via INTRAVENOUS
  Filled 2022-07-20: qty 100

## 2022-07-20 MED ORDER — FENTANYL CITRATE PF 50 MCG/ML IJ SOSY
100.0000 ug | PREFILLED_SYRINGE | Freq: Once | INTRAMUSCULAR | Status: DC
  Filled 2022-07-20: qty 2

## 2022-07-20 MED ORDER — MIDAZOLAM HCL 2 MG/2ML IJ SOLN
2.0000 mg | Freq: Once | INTRAMUSCULAR | Status: AC
  Administered 2022-07-20: 2 mg via INTRAVENOUS
  Filled 2022-07-20: qty 2

## 2022-07-20 MED ORDER — FENTANYL CITRATE PF 50 MCG/ML IJ SOSY
25.0000 ug | PREFILLED_SYRINGE | INTRAMUSCULAR | Status: DC | PRN
  Administered 2022-07-20 (×3): 50 ug via INTRAVENOUS

## 2022-07-20 MED ORDER — ORAL CARE MOUTH RINSE: 15.0000 mL | Freq: Once | OROMUCOSAL | Status: AC

## 2022-07-20 MED ORDER — METHOCARBAMOL 500 MG IVPB - SIMPLE MED
500.0000 mg | Freq: Four times a day (QID) | INTRAVENOUS | Status: DC | PRN
  Administered 2022-07-20: 500 mg via INTRAVENOUS

## 2022-07-20 MED ORDER — LACTATED RINGERS IV BOLUS
500.0000 mL | Freq: Once | INTRAVENOUS | Status: AC
  Administered 2022-07-20: 500 mL via INTRAVENOUS

## 2022-07-20 MED ORDER — TRAMADOL HCL 50 MG PO TABS
ORAL_TABLET | ORAL | Status: AC
  Filled 2022-07-20: qty 1

## 2022-07-20 MED ORDER — SODIUM CHLORIDE (PF) 0.9 % IJ SOLN
INTRAMUSCULAR | Status: DC | PRN
  Administered 2022-07-20: 70 mL

## 2022-07-20 MED ORDER — SODIUM CHLORIDE 0.9 % IR SOLN
Status: DC | PRN
  Administered 2022-07-20: 1000 mL

## 2022-07-20 MED ORDER — LACTATED RINGERS IV BOLUS
250.0000 mL | Freq: Once | INTRAVENOUS | Status: AC
  Administered 2022-07-20: 250 mL via INTRAVENOUS

## 2022-07-20 MED ORDER — DEXAMETHASONE SODIUM PHOSPHATE 10 MG/ML IJ SOLN
INTRAMUSCULAR | Status: DC | PRN
  Administered 2022-07-20: 10 mg via INTRAVENOUS

## 2022-07-20 MED ORDER — MIDAZOLAM HCL 2 MG/2ML IJ SOLN
INTRAMUSCULAR | Status: AC
  Filled 2022-07-20: qty 2

## 2022-07-20 MED ORDER — WATER FOR IRRIGATION, STERILE IR SOLN
Status: DC | PRN
  Administered 2022-07-20: 2000 mL

## 2022-07-20 MED ORDER — METHOCARBAMOL 500 MG IVPB - SIMPLE MED
INTRAVENOUS | Status: AC
  Filled 2022-07-20: qty 55

## 2022-07-20 MED ORDER — DEXAMETHASONE SODIUM PHOSPHATE 10 MG/ML IJ SOLN
INTRAMUSCULAR | Status: AC
  Filled 2022-07-20: qty 1

## 2022-07-20 MED ORDER — TRAMADOL HCL 50 MG PO TABS
50.0000 mg | ORAL_TABLET | Freq: Four times a day (QID) | ORAL | Status: DC
  Administered 2022-07-20: 50 mg via ORAL

## 2022-07-20 MED ORDER — PROPOFOL 500 MG/50ML IV EMUL
INTRAVENOUS | Status: DC | PRN
  Administered 2022-07-20: 125 ug/kg/min via INTRAVENOUS

## 2022-07-20 MED ORDER — ROPIVACAINE HCL 5 MG/ML IJ SOLN
INTRAMUSCULAR | Status: DC | PRN
  Administered 2022-07-20: 20 mL via PERINEURAL

## 2022-07-20 MED ORDER — PROPOFOL 10 MG/ML IV BOLUS
INTRAVENOUS | Status: DC | PRN
  Administered 2022-07-20 (×2): 20 mg via INTRAVENOUS

## 2022-07-20 MED ORDER — ASPIRIN 81 MG PO TBEC
81.0000 mg | DELAYED_RELEASE_TABLET | Freq: Two times a day (BID) | ORAL | 0 refills | Status: DC
  Filled 2022-07-20: qty 60, 30d supply, fill #0

## 2022-07-20 MED ORDER — LACTATED RINGERS IV SOLN: INTRAVENOUS | Status: DC

## 2022-07-20 MED ORDER — PROPOFOL 500 MG/50ML IV EMUL
INTRAVENOUS | Status: AC
  Filled 2022-07-20: qty 50

## 2022-07-20 MED ORDER — TIZANIDINE HCL 2 MG PO TABS
2.0000 mg | ORAL_TABLET | Freq: Four times a day (QID) | ORAL | 0 refills | Status: DC | PRN
  Filled 2022-07-20: qty 60, 15d supply, fill #0

## 2022-07-20 MED ORDER — BUPIVACAINE IN DEXTROSE 0.75-8.25 % IT SOLN
INTRATHECAL | Status: DC | PRN
  Administered 2022-07-20: 1.6 mL via INTRATHECAL

## 2022-07-20 MED ORDER — BUPIVACAINE HCL (PF) 0.25 % IJ SOLN
INTRAMUSCULAR | Status: DC | PRN
  Administered 2022-07-20: 30 mL

## 2022-07-20 MED ORDER — TRANEXAMIC ACID-NACL 1000-0.7 MG/100ML-% IV SOLN
1000.0000 mg | INTRAVENOUS | Status: AC
  Administered 2022-07-20: 1000 mg via INTRAVENOUS
  Filled 2022-07-20: qty 100

## 2022-07-20 MED ORDER — ONDANSETRON HCL 4 MG/2ML IJ SOLN
INTRAMUSCULAR | Status: DC | PRN
  Administered 2022-07-20: 4 mg via INTRAVENOUS

## 2022-07-20 MED ORDER — SODIUM CHLORIDE (PF) 0.9 % IJ SOLN
INTRAMUSCULAR | Status: AC
  Filled 2022-07-20: qty 20

## 2022-07-20 MED ORDER — FENTANYL CITRATE PF 50 MCG/ML IJ SOSY
PREFILLED_SYRINGE | INTRAMUSCULAR | Status: AC
  Filled 2022-07-20: qty 1

## 2022-07-20 MED ORDER — BUPIVACAINE LIPOSOME 1.3 % IJ SUSP
INTRAMUSCULAR | Status: DC | PRN
  Administered 2022-07-20: 20 mL

## 2022-07-20 MED ORDER — 0.9 % SODIUM CHLORIDE (POUR BTL) OPTIME
TOPICAL | Status: DC | PRN
  Administered 2022-07-20: 1000 mL

## 2022-07-20 MED ORDER — OXYCODONE-ACETAMINOPHEN 5-325 MG PO TABS
1.0000 | ORAL_TABLET | ORAL | 0 refills | Status: DC | PRN
  Filled 2022-07-20 – 2022-07-21 (×2): qty 30, 5d supply, fill #0

## 2022-07-20 MED ORDER — ACETAMINOPHEN 500 MG PO TABS
1000.0000 mg | ORAL_TABLET | Freq: Once | ORAL | Status: AC
  Administered 2022-07-20: 1000 mg via ORAL
  Filled 2022-07-20: qty 2

## 2022-07-20 MED ORDER — BUPIVACAINE HCL (PF) 0.25 % IJ SOLN
INTRAMUSCULAR | Status: AC
  Filled 2022-07-20: qty 30

## 2022-07-20 MED ORDER — MIDAZOLAM HCL 5 MG/5ML IJ SOLN
INTRAMUSCULAR | Status: DC | PRN
  Administered 2022-07-20: 2 mg via INTRAVENOUS

## 2022-07-20 MED ORDER — OXYCODONE HCL 5 MG PO TABS
ORAL_TABLET | ORAL | Status: AC
  Filled 2022-07-20: qty 2

## 2022-07-20 MED ORDER — PROPOFOL 1000 MG/100ML IV EMUL
INTRAVENOUS | Status: AC
  Filled 2022-07-20: qty 100

## 2022-07-20 MED ORDER — TRANEXAMIC ACID-NACL 1000-0.7 MG/100ML-% IV SOLN: 1000.0000 mg | Freq: Once | INTRAVENOUS | Status: DC

## 2022-07-20 MED ORDER — ONDANSETRON HCL 4 MG/2ML IJ SOLN
INTRAMUSCULAR | Status: AC
  Filled 2022-07-20: qty 2

## 2022-07-20 MED ORDER — BUPIVACAINE LIPOSOME 1.3 % IJ SUSP: 20.0000 mL | Freq: Once | INTRAMUSCULAR | Status: DC

## 2022-07-20 MED ORDER — AMISULPRIDE (ANTIEMETIC) 5 MG/2ML IV SOLN: 10.0000 mg | Freq: Once | INTRAVENOUS | Status: DC | PRN

## 2022-07-20 MED ORDER — FENTANYL CITRATE PF 50 MCG/ML IJ SOSY
PREFILLED_SYRINGE | INTRAMUSCULAR | Status: AC
  Filled 2022-07-20: qty 2

## 2022-07-20 MED ORDER — CHLORHEXIDINE GLUCONATE 0.12 % MT SOLN
15.0000 mL | Freq: Once | OROMUCOSAL | Status: AC
  Administered 2022-07-20: 15 mL via OROMUCOSAL

## 2022-07-20 SURGICAL SUPPLY — 50 items
ATTUNE MED DOME PAT 38 KNEE (Knees) IMPLANT
ATTUNE PS FEM LT SZ 6 CEM KNEE (Femur) IMPLANT
ATTUNE PSRP INSR SZ6 5 KNEE (Insert) IMPLANT
BAG COUNTER SPONGE SURGICOUNT (BAG) IMPLANT
BAG DECANTER FOR FLEXI CONT (MISCELLANEOUS) ×1 IMPLANT
BAG SPEC THK2 15X12 ZIP CLS (MISCELLANEOUS) ×1
BAG SPNG CNTER NS LX DISP (BAG) ×1
BAG ZIPLOCK 12X15 (MISCELLANEOUS) ×1 IMPLANT
BASE TIBIAL ROT PLAT SZ 7 KNEE (Knees) IMPLANT
BLADE SAG 18X100X1.27 (BLADE) ×1 IMPLANT
BLADE SAW SGTL 11.0X1.19X90.0M (BLADE) ×1 IMPLANT
BLADE SURG SZ10 CARB STEEL (BLADE) ×2 IMPLANT
BNDG CMPR MED 10X6 ELC LF (GAUZE/BANDAGES/DRESSINGS) ×1
BNDG ELASTIC 6X10 VLCR STRL LF (GAUZE/BANDAGES/DRESSINGS) ×1 IMPLANT
BOWL SMART MIX CTS (DISPOSABLE) ×1 IMPLANT
BSPLAT TIB 7 CMNT ROT PLAT STR (Knees) ×1 IMPLANT
CEMENT HV SMART SET (Cement) ×2 IMPLANT
COVER SURGICAL LIGHT HANDLE (MISCELLANEOUS) ×1 IMPLANT
DRAPE INCISE IOBAN 66X45 STRL (DRAPES) IMPLANT
DRAPE U-SHAPE 47X51 STRL (DRAPES) ×1 IMPLANT
DRSG AQUACEL AG ADV 3.5X10 (GAUZE/BANDAGES/DRESSINGS) ×1 IMPLANT
DURAPREP 26ML APPLICATOR (WOUND CARE) ×1 IMPLANT
ELECT REM PT RETURN 15FT ADLT (MISCELLANEOUS) ×1 IMPLANT
GLOVE BIO SURGEON STRL SZ7.5 (GLOVE) ×1 IMPLANT
GLOVE BIO SURGEON STRL SZ8.5 (GLOVE) ×1 IMPLANT
GLOVE BIOGEL PI IND STRL 8 (GLOVE) ×1 IMPLANT
GLOVE BIOGEL PI IND STRL 9 (GLOVE) ×1 IMPLANT
GOWN STRL REUS W/ TWL XL LVL3 (GOWN DISPOSABLE) ×2 IMPLANT
GOWN STRL REUS W/TWL XL LVL3 (GOWN DISPOSABLE) ×2
HANDPIECE INTERPULSE COAX TIP (DISPOSABLE) ×1
HOOD PEEL AWAY T7 (MISCELLANEOUS) ×3 IMPLANT
KIT TURNOVER KIT A (KITS) IMPLANT
NDL HYPO 21X1.5 SAFETY (NEEDLE) ×2 IMPLANT
NEEDLE HYPO 21X1.5 SAFETY (NEEDLE) ×2 IMPLANT
NS IRRIG 1000ML POUR BTL (IV SOLUTION) ×1 IMPLANT
PACK TOTAL KNEE CUSTOM (KITS) ×1 IMPLANT
PIN STEINMAN FIXATION KNEE (PIN) IMPLANT
PROTECTOR NERVE ULNAR (MISCELLANEOUS) ×1 IMPLANT
SET HNDPC FAN SPRY TIP SCT (DISPOSABLE) ×1 IMPLANT
SPIKE FLUID TRANSFER (MISCELLANEOUS) ×3 IMPLANT
SUT VIC AB 1 CTX 36 (SUTURE) ×1
SUT VIC AB 1 CTX36XBRD ANBCTR (SUTURE) ×1 IMPLANT
SUT VICRYL+ 3-0 36IN CT-1 (SUTURE) ×3 IMPLANT
SYR CONTROL 10ML LL (SYRINGE) ×2 IMPLANT
TIBIAL BASE ROT PLAT SZ 7 KNEE (Knees) ×1 IMPLANT
TRAY CATH INTERMITTENT SS 16FR (CATHETERS) IMPLANT
TRAY FOLEY MTR SLVR 16FR STAT (SET/KITS/TRAYS/PACK) ×1 IMPLANT
TUBE SUCTION HIGH CAP CLEAR NV (SUCTIONS) ×1 IMPLANT
WATER STERILE IRR 1000ML POUR (IV SOLUTION) ×2 IMPLANT
WRAP KNEE MAXI GEL POST OP (GAUZE/BANDAGES/DRESSINGS) ×1 IMPLANT

## 2022-07-20 NOTE — Transfer of Care (Signed)
Immediate Anesthesia Transfer of Care Note  Patient: Julia Monroe  Procedure(s) Performed: LEFT TOTAL KNEE ARTHROPLASTY (Left: Knee)  Patient Location: PACU  Anesthesia Type:Regional and Spinal  Level of Consciousness: awake, alert , and oriented  Airway & Oxygen Therapy: Patient Spontanous Breathing and Patient connected to face mask oxygen  Post-op Assessment: Report given to RN and Post -op Vital signs reviewed and stable  Post vital signs: Reviewed and stable  Last Vitals:  Vitals Value Taken Time  BP 121/55 07/20/22 1127  Temp    Pulse 75 07/20/22 1128  Resp 23 07/20/22 1128  SpO2 100 % 07/20/22 1128  Vitals shown include unvalidated device data.  Last Pain:  Vitals:   07/20/22 0838  TempSrc:   PainSc: 0-No pain         Complications: No notable events documented.

## 2022-07-20 NOTE — OR Nursing (Signed)
IN AND OUT CATH = YELLOW URINE

## 2022-07-20 NOTE — Interval H&P Note (Signed)
History and Physical Interval Note:  07/20/2022 8:51 AM  Julia Monroe  has presented today for surgery, with the diagnosis of LEFT KNEE OSTEOARTHRITIS.  The various methods of treatment have been discussed with the patient and family. After consideration of risks, benefits and other options for treatment, the patient has consented to  Procedure(s): LEFT TOTAL KNEE ARTHROPLASTY (Left) as a surgical intervention.  The patient's history has been reviewed, patient examined, no change in status, stable for surgery.  I have reviewed the patient's chart and labs.  Questions were answered to the patient's satisfaction.     Nestor Lewandowsky

## 2022-07-20 NOTE — Op Note (Signed)
PATIENT ID:      Julia Monroe  MRN:     [id number] DOB/AGE:    08-17-1970 / 52 y.o.       OPERATIVE REPORT   DATE OF PROCEDURE:  07/20/2022      PREOPERATIVE DIAGNOSIS:   LEFT KNEE OSTEOARTHRITIS      Estimated body mass index is 34.06 kg/m as calculated from the following:   Height as of this encounter: 5' 9.5" (1.765 m).   Weight as of this encounter: 106.1 kg.                                                       POSTOPERATIVE DIAGNOSIS:   Same                                                                  PROCEDURE:  Procedure(s): LEFT TOTAL KNEE ARTHROPLASTY Using DepuyAttune RP implants #6L Femur, #7Tibia, 5 mm Attune RP bearing, 38 Patella    SURGEON: Nestor Lewandowsky  ASSISTANT:   Tomi Likens. [employer]   (Present and scrubbed throughout the case, critical for assistance with exposure, retraction, instrumentation, and closure.)        ANESTHESIA: Spinal, 20cc Exparel, 50cc 0.25% Marcaine EBL: 300 cc FLUID REPLACEMENT: 1500 cc crystaloid TOURNIQUET: DRAINS: None TRANEXAMIC ACID: 1gm IV, 2gm topical COMPLICATIONS:  None         INDICATIONS FOR PROCEDURE: The patient has  LEFT KNEE OSTEOARTHRITIS, Val deformities, XR shows bone on bone arthritis, lateral subluxation of tibia. Patient has failed all conservative measures including anti-inflammatory medicines, narcotics, attempts at exercise and weight loss, cortisone injections and viscosupplementation.  Risks and benefits of surgery have been discussed, questions answered.   DESCRIPTION OF PROCEDURE: The patient identified by armband, received  IV antibiotics, in the holding area at [facility]. Patient taken to the operating room, appropriate anesthetic monitors were attached, and Spinal anesthesia was  induced. IV Tranexamic acid was given. Lateral post and 2 surefoot positioners applied to the table, the lower extremity was then prepped and draped in usual sterile fashion from the toes to the high thigh. Time-out  procedure was performed. Tomi Likens. [facility] PAC, was present and scrubbed throughout the case, critical for assistance with, positioning, exposure, retraction, instrumentation, and closure.The skin and subcutaneous tissue along the incision was injected with 20 cc of a mixture of 20cc Exparel and 30cc Marcaine 50cc saline solution, using a 21-gauge by 1-1/2 inch needle. We began the operation, with the knee flexed 130 degrees, by making the anterior midline incision starting at handbreadth above the patella going over the patella 1 cm medial to and 4 cm distal to the tibial tubercle. Small bleeders in the skin and the subcutaneous tissue identified and cauterized. Transverse retinaculum was incised and reflected medially and a medial parapatellar arthrotomy was accomplished. the patella was everted and theprepatellar fat pad resected. The superficial medial collateral ligament was then elevated from anterior to posterior along the proximal flare of the tibia and anterior half of the menisci resected. The knee was hyperflexed exposing bone on bone arthritis. Peripheral  and notch osteophytes as well as the cruciate ligaments were then resected. We continued to work our way around posteriorly along the proximal tibia, and externally rotated the tibia subluxing it out from underneath the femur. A McHale PCL retractor was placed through the notch, a lateral Hohmann retractor, and anterolateral small homan retractor placed. We then entered the proximal tibia with the Depuy starter drill in line with the axis of the tibia followed by an intramedullary guide rod and 3-degree posterior slope cutting guide. The tibial cutting guide, was pinned into place allowing resection of 6 mm of bone medially and 0 mm of bone laterally. Satisfied with the tibial resection, we then entered the distal femur 2 mm anterior to the PCL origin with the starter drill, followed by the intramedullary guide rod and applied the distal femoral cutting  guide set at 9 mm, with 5 degrees of valgus. This was pinned along the epicondylar axis. At this point, the distal femoral cut was accomplished without difficulty. We then sized for a #6L femoral component and pinned the chamfer guide in 0 degrees of external rotation. The anterior, posterior, and chamfer cuts were accomplished without difficulty followed by the Attune RP box cutting guide and the box cut. We also removed posterior osteophytes from the posterior femoral condyles. The posterior capsule was injected with Exparel solution. The knee was brought into full extension. We checked our extension gap and fit a 5 mm trial lollipop. Distracting in extension with a lamina spreader,  bleeders in the posterior capsule, Posterior medial and posterior lateral gutter were cauterized.  The transexamic acid-soaked sponge was then placed in the gap of the knee in extension. The knee was flexed 30. The posterior patella cut was accomplished with the 9.5 mm Attune cutting guide, sized for a 38 mm dome, and the fixation pegs drilled.The knee was then once again hyperflexed exposing the proximal tibia. We sized for a # 7 tibial base plate, applied the smokestack and the conical reamer followed by the the Delta fin keel punch. We then hammered into place the Attune RP trial femoral component, drilled the lugs, inserted a  5 mm trial bearing, trial patellar button, and took the knee through range of motion from 0-130 degrees. Medial and lateral ligamentous stability was checked. No thumb pressure was required for patellar Tracking.  All trial components were removed, mating surfaces irrigated with pulse lavage, and dried with suction and sponges. 10 cc of the Exparel solution was applied to the cancellus bone of the patella distal femur and proximal tibia.  After waiting 30 seconds, the bony surfaces were again, dried with sponges. A double batch of DePuy HV cement was mixed and applied to all bony metallic mating surfaces  except for the posterior condyles of the femur itself. In order, we hammered into place the tibial tray and removed excess cement, the femoral component and removed excess cement. The final Attune RP bearing was inserted, and the knee brought to full extension with compression. The patellar button was clamped into place, and excess cement removed. The knee was held at 30 flexion with compression using the second surefoot, while the cement cured. The wound was irrigated out with normal saline solution pulse lavage. The rest of the Exparel was injected into the parapatellar arthrotomy, subcutaneous tissues, and periosteal tissues. The parapatellar arthrotomy was closed with running #1 Vicryl suture. The subcutaneous tissue with 3-0 undyed Vicryl suture, and the skin with running 3-0 SQ vicryl. An Aquacil dressing and Ace wrap  were applied. The patient was taken to recovery room without difficulty.   Nestor Lewandowsky 07/20/2022, 8:52 AM

## 2022-07-20 NOTE — Progress Notes (Signed)
Per patient , she is willing to accept blood products in case of emergency.   Dr. Freida Busman aware ,  No blood product form and order removed. No further orders at this time

## 2022-07-20 NOTE — Anesthesia Postprocedure Evaluation (Signed)
Anesthesia Post Note  Patient: Julia Monroe  Procedure(s) Performed: LEFT TOTAL KNEE ARTHROPLASTY (Left: Knee)     Patient location during evaluation: PACU Anesthesia Type: Regional, Spinal and MAC Level of consciousness: awake Pain management: pain level controlled Vital Signs Assessment: post-procedure vital signs reviewed and stable Respiratory status: spontaneous breathing, respiratory function stable and nonlabored ventilation Cardiovascular status: blood pressure returned to baseline and stable Postop Assessment: no headache, no backache and no apparent nausea or vomiting Anesthetic complications: no   No notable events documented.  Last Vitals:  Vitals:   07/20/22 1145 07/20/22 1200  BP: 129/69 128/60  Pulse: 75 67  Resp: (!) 22 15  Temp:  (!) 36.3 C  SpO2: 100% 100%    Last Pain:  Vitals:   07/20/22 1200  TempSrc:   PainSc: 5                  Linton Rump

## 2022-07-20 NOTE — Anesthesia Procedure Notes (Signed)
Anesthesia Regional Block: Adductor canal block   Pre-Anesthetic Checklist: , timeout performed,  Correct Patient, Correct Site, Correct Laterality,  Correct Procedure, Correct Position, site marked,  Risks and benefits discussed,  Surgical consent,  Pre-op evaluation,  At surgeon's request and post-op pain management  Laterality: Left  Prep: chloraprep       Needles:  Injection technique: Single-shot  Needle Type: Echogenic Stimulator Needle     Needle Length: 9cm  Needle Gauge: 21     Additional Needles:   Procedures:,,,, ultrasound used (permanent image in chart),,    Narrative:  Start time: 07/20/2022 8:36 AM End time: 07/20/2022 8:39 AM Injection made incrementally with aspirations every 5 mL.  Performed by: Personally  Anesthesiologist: Linton Rump, MD  Additional Notes: Discussed risks and benefits of nerve block including, but not limited to, prolonged and/or permanent nerve injury involving sensory and/or motor function. Monitors were applied and a time-out was performed. The nerve and associated structures were visualized under ultrasound guidance. After negative aspiration, local anesthetic was slowly injected around the nerve. There was no evidence of high pressure during the procedure. There were no paresthesias. VSS remained stable and the patient tolerated the procedure well.

## 2022-07-20 NOTE — Anesthesia Procedure Notes (Signed)
Spinal  Patient location during procedure: OR End time: 07/20/2022 9:47 AM Reason for block: surgical anesthesia Staffing Performed: resident/CRNA  Resident/CRNA: Atilano Median, CRNA Performed by: Harlon Kutner, Clinical cytogeneticist D, CRNA Authorized by: Linton Rump, MD   Preanesthetic Checklist Completed: patient identified, IV checked, site marked, risks and benefits discussed, surgical consent, monitors and equipment checked, pre-op evaluation and timeout performed Spinal Block Patient position: sitting Prep: DuraPrep Patient monitoring: heart rate, continuous pulse ox and blood pressure Approach: midline Location: L3-4 Injection technique: single-shot Needle Needle type: Pencan  Needle gauge: 24 G Needle length: 9 cm Assessment Sensory level: T6 Events: CSF return

## 2022-07-20 NOTE — Progress Notes (Signed)
Pt tolerated being up with physical therapy well, pt has walker at home; pt has her pain med at home; pt able to void in bathroom while up with PT

## 2022-07-20 NOTE — H&P (View-Only) (Signed)
Per patient , she is willing to accept blood products in case of emergency.   Dr. Allen aware ,  No blood product form and order removed. No further orders at this time   

## 2022-07-20 NOTE — Discharge Instructions (Signed)

## 2022-07-20 NOTE — Evaluation (Signed)
Physical Therapy Evaluation Patient Details Name: Julia Monroe MRN: [id number] DOB: [birth year] Today's Date: 07/20/2022  History of Present Illness  52 yo female presents to therapy s/p L TKA on 07/20/2022 due to failure of conservative measures. Pt PMH includes but is not limited to: gastric sleeve, B THA, systemic lupus erythematosus, ANA positive, and GERD.  Clinical Impression    Julia Monroe is a 52 y.o. female POD 0 s/p L TKA. Patient reports IND with mobility at baseline. Patient is now limited by functional impairments (see PT problem list below) and requires min guard for transfers and gait with RW. Patient was able to ambulate 50 feet x 2  with RW and min guard progressing to S and cues for safe walker management. Patient educated on safe sequencing for stair mobility, fall risk prevention, car transfers, use of CP/ice and pain goal pt and daughter verbalized understanding of safe guarding position for people assisting with mobility. Patient instructed in exercises to facilitate ROM and circulation reviewed with HO provided. Patient will benefit from continued skilled PT interventions to address impairments and progress towards PLOF. Patient has met mobility goals at adequate level for discharge home with family support and [facility] services; will continue to follow if pt continues acute stay to progress towards Mod I goals.      Recommendations for follow up therapy are one component of a multi-disciplinary discharge planning process, led by the attending physician.  Recommendations may be updated based on patient status, additional functional criteria and insurance authorization.  Follow Up Recommendations       Assistance Recommended at Discharge Intermittent Supervision/Assistance  Patient can return home with the following  A little help with walking and/or transfers;A little help with bathing/dressing/bathroom;Assistance with cooking/housework;Assist for  transportation;Help with stairs or ramp for entrance    Equipment Recommendations None recommended by PT  Recommendations for Other Services       Functional Status Assessment Patient has had a recent decline in their functional status and demonstrates the ability to make significant improvements in function in a reasonable and predictable amount of time.     Precautions / Restrictions Precautions Precautions: Knee;Fall Restrictions Weight Bearing Restrictions: No      Mobility  Bed Mobility Overal bed mobility: Needs Assistance Bed Mobility: Supine to Sit     Supine to sit: Supervision     General bed mobility comments: min cues    Transfers Overall transfer level: Needs assistance Equipment used: Rolling walker (2 wheels) Transfers: Sit to/from Stand Sit to Stand: Min guard           General transfer comment: cues for proper UE and AD placement for bed, commode and recliner transfers    Ambulation/Gait Ambulation/Gait assistance: Min guard Gait Distance (Feet): 50 Feet Assistive device: Rolling walker (2 wheels) Gait Pattern/deviations: Step-to pattern, Antalgic, Narrow base of support Gait velocity: decreased     General Gait Details: cues for posture and increased BOS  Stairs Stairs: Yes Stairs assistance: Min guard Stair Management: Two rails Number of Stairs: 2 General stair comments: cues for proper sequencing and safety as well as use of RW to navigation one step to enter home  Wheelchair Mobility    Modified Rankin (Stroke Patients Only)       Balance Overall balance assessment: Needs assistance Sitting-balance support: Feet supported Sitting balance-Leahy Scale: Good     Standing balance support: Bilateral upper extremity supported, During functional activity, Reliant on assistive device for balance Standing balance-Leahy Scale: Fair Standing  balance comment: static stand no UE support                              Pertinent Vitals/Pain Pain Assessment Pain Assessment: 0-10 Pain Score: 7  Pain Location: L knee Pain Descriptors / Indicators: Aching, Constant, Cramping, Discomfort, Operative site guarding Pain Intervention(s): Limited activity within patient's tolerance, Monitored during session, Premedicated before session, Repositioned, Ice applied    Home Living Family/patient expects to be discharged to:: Private residence Living Arrangements: Children Available Help at Discharge: Family Type of Home: House Home Access: Stairs to enter Entrance Stairs-Rails: None Entrance Stairs-Number of Steps: 1   Home Layout: One level Home Equipment: Agricultural consultant (2 wheels);Cane - quad;Tub bench;Toilet riser      Prior Function Prior Level of Function : Independent/Modified Independent             Mobility Comments: IND with all ADLS, self care tasks, IADLs, driving and working       Hand Dominance   Dominant Hand: Right    Extremity/Trunk Assessment        Lower Extremity Assessment Lower Extremity Assessment: LLE deficits/detail LLE Deficits / Details: ankle DF/PF 5/5; SLR < 10 degree lag LLE Sensation: decreased light touch (buttock and groin region)    Cervical / Trunk Assessment Cervical / Trunk Assessment:  (wfl)  Communication   Communication: No difficulties  Cognition Arousal/Alertness: Awake/alert Behavior During Therapy: WFL for tasks assessed/performed Overall Cognitive Status: Within Functional Limits for tasks assessed                                          General Comments      Exercises Total Joint Exercises Ankle Circles/Pumps: AROM, Both, 20 reps Quad Sets: AROM, Right, 5 reps Short Arc Quad: AROM, Left, 5 reps Heel Slides: AROM, Left, 5 reps Hip ABduction/ADduction: AROM, Left, 5 reps Straight Leg Raises: AROM, Left, 5 reps Knee Flexion: AROM, Left, 5 reps, Seated   Assessment/Plan    PT Assessment Patient needs  continued PT services  PT Problem List Decreased strength;Decreased range of motion;Decreased activity tolerance;Decreased balance;Decreased mobility;Decreased coordination;Pain       PT Treatment Interventions DME instruction;Gait training;Stair training;Functional mobility training;Therapeutic activities;Therapeutic exercise;Balance training;Neuromuscular re-education;Patient/family education;Modalities    PT Goals (Current goals can be found in the Care Plan section)  Acute Rehab PT Goals Patient Stated Goal: to go back to work PT Goal Formulation: With patient Time For Goal Achievement: 08/03/22 Potential to Achieve Goals: Good    Frequency       Co-evaluation               AM-PAC PT "6 Clicks" Mobility  Outcome Measure Help needed turning from your back to your side while in a flat bed without using bedrails?: None Help needed moving from lying on your back to sitting on the side of a flat bed without using bedrails?: A Little Help needed moving to and from a bed to a chair (including a wheelchair)?: A Little Help needed standing up from a chair using your arms (e.g., wheelchair or bedside chair)?: A Little Help needed to walk in hospital room?: A Little Help needed climbing 3-5 steps with a railing? : A Little 6 Click Score: 19    End of Session Equipment Utilized During Treatment: Gait belt Activity Tolerance: Patient tolerated treatment  well Patient left: in chair;with call bell/phone within reach;with nursing/sitter in room;with family/visitor present Nurse Communication: Mobility status;Other (comment) (pt readiness for d/c) PT Visit Diagnosis: Unsteadiness on feet (R26.81);Other abnormalities of gait and mobility (R26.89);Muscle weakness (generalized) (M62.81);Pain Pain - Right/Left: Left Pain - part of body: Leg;Knee    Time: 1308-6578 PT Time Calculation (min) (ACUTE ONLY): 53 min   Charges:   PT Evaluation $PT Eval Low Complexity: 1 Low PT  Treatments $Gait Training: 8-22 mins $Therapeutic Exercise: 8-22 mins $Therapeutic Activity: 8-22 mins        Johnny Bridge, PT Acute Rehab   Jacqualyn Posey 07/20/2022, 4:25 PM

## 2022-07-20 NOTE — Progress Notes (Signed)
Orthopedic Tech Progress Note Patient Details:  Julia Monroe 03-01-1970 [id number]  Ortho Devices Type of Ortho Device: Bone foam zero knee Ortho Device/Splint Interventions: Ordered      [name] 07/20/2022, 11:55 AM

## 2022-07-21 ENCOUNTER — Other Ambulatory Visit (HOSPITAL_BASED_OUTPATIENT_CLINIC_OR_DEPARTMENT_OTHER): Payer: Self-pay

## 2022-07-22 ENCOUNTER — Encounter (HOSPITAL_COMMUNITY): Payer: Self-pay | Admitting: Orthopedic Surgery

## 2022-07-24 ENCOUNTER — Other Ambulatory Visit (HOSPITAL_BASED_OUTPATIENT_CLINIC_OR_DEPARTMENT_OTHER): Payer: Self-pay

## 2022-07-24 MED ORDER — ACETAMINOPHEN-CODEINE 300-30 MG PO TABS
1.0000 | ORAL_TABLET | Freq: Four times a day (QID) | ORAL | 0 refills | Status: DC
  Filled 2022-07-24: qty 60, 8d supply, fill #0

## 2022-07-26 DIAGNOSIS — K5900 Constipation, unspecified: Secondary | ICD-10-CM | POA: Diagnosis not present

## 2022-07-26 DIAGNOSIS — K219 Gastro-esophageal reflux disease without esophagitis: Secondary | ICD-10-CM | POA: Diagnosis not present

## 2022-07-26 DIAGNOSIS — Z6838 Body mass index (BMI) 38.0-38.9, adult: Secondary | ICD-10-CM | POA: Diagnosis not present

## 2022-07-26 DIAGNOSIS — G43909 Migraine, unspecified, not intractable, without status migrainosus: Secondary | ICD-10-CM | POA: Diagnosis not present

## 2022-07-26 DIAGNOSIS — Z471 Aftercare following joint replacement surgery: Secondary | ICD-10-CM | POA: Diagnosis not present

## 2022-07-26 DIAGNOSIS — M329 Systemic lupus erythematosus, unspecified: Secondary | ICD-10-CM | POA: Diagnosis not present

## 2022-07-26 DIAGNOSIS — R7303 Prediabetes: Secondary | ICD-10-CM | POA: Diagnosis not present

## 2022-07-26 DIAGNOSIS — M1711 Unilateral primary osteoarthritis, right knee: Secondary | ICD-10-CM | POA: Diagnosis not present

## 2022-07-26 DIAGNOSIS — Z96643 Presence of artificial hip joint, bilateral: Secondary | ICD-10-CM | POA: Diagnosis not present

## 2022-07-29 DIAGNOSIS — Z96643 Presence of artificial hip joint, bilateral: Secondary | ICD-10-CM | POA: Diagnosis not present

## 2022-07-29 DIAGNOSIS — R7303 Prediabetes: Secondary | ICD-10-CM | POA: Diagnosis not present

## 2022-07-29 DIAGNOSIS — Z6838 Body mass index (BMI) 38.0-38.9, adult: Secondary | ICD-10-CM | POA: Diagnosis not present

## 2022-07-29 DIAGNOSIS — Z471 Aftercare following joint replacement surgery: Secondary | ICD-10-CM | POA: Diagnosis not present

## 2022-07-29 DIAGNOSIS — M1711 Unilateral primary osteoarthritis, right knee: Secondary | ICD-10-CM | POA: Diagnosis not present

## 2022-07-29 DIAGNOSIS — K5900 Constipation, unspecified: Secondary | ICD-10-CM | POA: Diagnosis not present

## 2022-07-29 DIAGNOSIS — M329 Systemic lupus erythematosus, unspecified: Secondary | ICD-10-CM | POA: Diagnosis not present

## 2022-07-29 DIAGNOSIS — K219 Gastro-esophageal reflux disease without esophagitis: Secondary | ICD-10-CM | POA: Diagnosis not present

## 2022-07-29 DIAGNOSIS — G43909 Migraine, unspecified, not intractable, without status migrainosus: Secondary | ICD-10-CM | POA: Diagnosis not present

## 2022-07-31 DIAGNOSIS — K5900 Constipation, unspecified: Secondary | ICD-10-CM | POA: Diagnosis not present

## 2022-07-31 DIAGNOSIS — M1711 Unilateral primary osteoarthritis, right knee: Secondary | ICD-10-CM | POA: Diagnosis not present

## 2022-07-31 DIAGNOSIS — Z471 Aftercare following joint replacement surgery: Secondary | ICD-10-CM | POA: Diagnosis not present

## 2022-07-31 DIAGNOSIS — R7303 Prediabetes: Secondary | ICD-10-CM | POA: Diagnosis not present

## 2022-07-31 DIAGNOSIS — Z6838 Body mass index (BMI) 38.0-38.9, adult: Secondary | ICD-10-CM | POA: Diagnosis not present

## 2022-07-31 DIAGNOSIS — M329 Systemic lupus erythematosus, unspecified: Secondary | ICD-10-CM | POA: Diagnosis not present

## 2022-07-31 DIAGNOSIS — G43909 Migraine, unspecified, not intractable, without status migrainosus: Secondary | ICD-10-CM | POA: Diagnosis not present

## 2022-07-31 DIAGNOSIS — Z96643 Presence of artificial hip joint, bilateral: Secondary | ICD-10-CM | POA: Diagnosis not present

## 2022-07-31 DIAGNOSIS — K219 Gastro-esophageal reflux disease without esophagitis: Secondary | ICD-10-CM | POA: Diagnosis not present

## 2022-08-03 DIAGNOSIS — K219 Gastro-esophageal reflux disease without esophagitis: Secondary | ICD-10-CM | POA: Diagnosis not present

## 2022-08-03 DIAGNOSIS — K5900 Constipation, unspecified: Secondary | ICD-10-CM | POA: Diagnosis not present

## 2022-08-03 DIAGNOSIS — R7303 Prediabetes: Secondary | ICD-10-CM | POA: Diagnosis not present

## 2022-08-03 DIAGNOSIS — Z96643 Presence of artificial hip joint, bilateral: Secondary | ICD-10-CM | POA: Diagnosis not present

## 2022-08-03 DIAGNOSIS — M329 Systemic lupus erythematosus, unspecified: Secondary | ICD-10-CM | POA: Diagnosis not present

## 2022-08-03 DIAGNOSIS — Z471 Aftercare following joint replacement surgery: Secondary | ICD-10-CM | POA: Diagnosis not present

## 2022-08-03 DIAGNOSIS — M1711 Unilateral primary osteoarthritis, right knee: Secondary | ICD-10-CM | POA: Diagnosis not present

## 2022-08-03 DIAGNOSIS — Z6838 Body mass index (BMI) 38.0-38.9, adult: Secondary | ICD-10-CM | POA: Diagnosis not present

## 2022-08-03 DIAGNOSIS — G43909 Migraine, unspecified, not intractable, without status migrainosus: Secondary | ICD-10-CM | POA: Diagnosis not present

## 2022-08-04 DIAGNOSIS — M1712 Unilateral primary osteoarthritis, left knee: Secondary | ICD-10-CM | POA: Diagnosis not present

## 2022-08-05 DIAGNOSIS — Z471 Aftercare following joint replacement surgery: Secondary | ICD-10-CM | POA: Diagnosis not present

## 2022-08-05 DIAGNOSIS — M1711 Unilateral primary osteoarthritis, right knee: Secondary | ICD-10-CM | POA: Diagnosis not present

## 2022-08-05 DIAGNOSIS — R7303 Prediabetes: Secondary | ICD-10-CM | POA: Diagnosis not present

## 2022-08-05 DIAGNOSIS — K5900 Constipation, unspecified: Secondary | ICD-10-CM | POA: Diagnosis not present

## 2022-08-05 DIAGNOSIS — Z6838 Body mass index (BMI) 38.0-38.9, adult: Secondary | ICD-10-CM | POA: Diagnosis not present

## 2022-08-05 DIAGNOSIS — K219 Gastro-esophageal reflux disease without esophagitis: Secondary | ICD-10-CM | POA: Diagnosis not present

## 2022-08-05 DIAGNOSIS — M329 Systemic lupus erythematosus, unspecified: Secondary | ICD-10-CM | POA: Diagnosis not present

## 2022-08-05 DIAGNOSIS — Z96643 Presence of artificial hip joint, bilateral: Secondary | ICD-10-CM | POA: Diagnosis not present

## 2022-08-05 DIAGNOSIS — G43909 Migraine, unspecified, not intractable, without status migrainosus: Secondary | ICD-10-CM | POA: Diagnosis not present

## 2022-08-07 DIAGNOSIS — Z6838 Body mass index (BMI) 38.0-38.9, adult: Secondary | ICD-10-CM | POA: Diagnosis not present

## 2022-08-07 DIAGNOSIS — K219 Gastro-esophageal reflux disease without esophagitis: Secondary | ICD-10-CM | POA: Diagnosis not present

## 2022-08-07 DIAGNOSIS — Z471 Aftercare following joint replacement surgery: Secondary | ICD-10-CM | POA: Diagnosis not present

## 2022-08-07 DIAGNOSIS — Z96643 Presence of artificial hip joint, bilateral: Secondary | ICD-10-CM | POA: Diagnosis not present

## 2022-08-07 DIAGNOSIS — M1711 Unilateral primary osteoarthritis, right knee: Secondary | ICD-10-CM | POA: Diagnosis not present

## 2022-08-07 DIAGNOSIS — R7303 Prediabetes: Secondary | ICD-10-CM | POA: Diagnosis not present

## 2022-08-07 DIAGNOSIS — G43909 Migraine, unspecified, not intractable, without status migrainosus: Secondary | ICD-10-CM | POA: Diagnosis not present

## 2022-08-07 DIAGNOSIS — M329 Systemic lupus erythematosus, unspecified: Secondary | ICD-10-CM | POA: Diagnosis not present

## 2022-08-07 DIAGNOSIS — K5900 Constipation, unspecified: Secondary | ICD-10-CM | POA: Diagnosis not present

## 2022-08-10 DIAGNOSIS — Z6838 Body mass index (BMI) 38.0-38.9, adult: Secondary | ICD-10-CM | POA: Diagnosis not present

## 2022-08-10 DIAGNOSIS — R7303 Prediabetes: Secondary | ICD-10-CM | POA: Diagnosis not present

## 2022-08-10 DIAGNOSIS — M329 Systemic lupus erythematosus, unspecified: Secondary | ICD-10-CM | POA: Diagnosis not present

## 2022-08-10 DIAGNOSIS — Z96643 Presence of artificial hip joint, bilateral: Secondary | ICD-10-CM | POA: Diagnosis not present

## 2022-08-10 DIAGNOSIS — M1711 Unilateral primary osteoarthritis, right knee: Secondary | ICD-10-CM | POA: Diagnosis not present

## 2022-08-10 DIAGNOSIS — Z471 Aftercare following joint replacement surgery: Secondary | ICD-10-CM | POA: Diagnosis not present

## 2022-08-10 DIAGNOSIS — K219 Gastro-esophageal reflux disease without esophagitis: Secondary | ICD-10-CM | POA: Diagnosis not present

## 2022-08-10 DIAGNOSIS — G43909 Migraine, unspecified, not intractable, without status migrainosus: Secondary | ICD-10-CM | POA: Diagnosis not present

## 2022-08-10 DIAGNOSIS — K5900 Constipation, unspecified: Secondary | ICD-10-CM | POA: Diagnosis not present

## 2022-08-12 DIAGNOSIS — K5900 Constipation, unspecified: Secondary | ICD-10-CM | POA: Diagnosis not present

## 2022-08-12 DIAGNOSIS — M1711 Unilateral primary osteoarthritis, right knee: Secondary | ICD-10-CM | POA: Diagnosis not present

## 2022-08-12 DIAGNOSIS — Z6838 Body mass index (BMI) 38.0-38.9, adult: Secondary | ICD-10-CM | POA: Diagnosis not present

## 2022-08-12 DIAGNOSIS — Z96643 Presence of artificial hip joint, bilateral: Secondary | ICD-10-CM | POA: Diagnosis not present

## 2022-08-12 DIAGNOSIS — K219 Gastro-esophageal reflux disease without esophagitis: Secondary | ICD-10-CM | POA: Diagnosis not present

## 2022-08-12 DIAGNOSIS — Z471 Aftercare following joint replacement surgery: Secondary | ICD-10-CM | POA: Diagnosis not present

## 2022-08-12 DIAGNOSIS — G43909 Migraine, unspecified, not intractable, without status migrainosus: Secondary | ICD-10-CM | POA: Diagnosis not present

## 2022-08-12 DIAGNOSIS — R7303 Prediabetes: Secondary | ICD-10-CM | POA: Diagnosis not present

## 2022-08-12 DIAGNOSIS — M329 Systemic lupus erythematosus, unspecified: Secondary | ICD-10-CM | POA: Diagnosis not present

## 2022-08-14 DIAGNOSIS — K5900 Constipation, unspecified: Secondary | ICD-10-CM | POA: Diagnosis not present

## 2022-08-14 DIAGNOSIS — Z6838 Body mass index (BMI) 38.0-38.9, adult: Secondary | ICD-10-CM | POA: Diagnosis not present

## 2022-08-14 DIAGNOSIS — M329 Systemic lupus erythematosus, unspecified: Secondary | ICD-10-CM | POA: Diagnosis not present

## 2022-08-14 DIAGNOSIS — R7303 Prediabetes: Secondary | ICD-10-CM | POA: Diagnosis not present

## 2022-08-14 DIAGNOSIS — M1711 Unilateral primary osteoarthritis, right knee: Secondary | ICD-10-CM | POA: Diagnosis not present

## 2022-08-14 DIAGNOSIS — G43909 Migraine, unspecified, not intractable, without status migrainosus: Secondary | ICD-10-CM | POA: Diagnosis not present

## 2022-08-14 DIAGNOSIS — Z471 Aftercare following joint replacement surgery: Secondary | ICD-10-CM | POA: Diagnosis not present

## 2022-08-14 DIAGNOSIS — K219 Gastro-esophageal reflux disease without esophagitis: Secondary | ICD-10-CM | POA: Diagnosis not present

## 2022-08-14 DIAGNOSIS — Z96643 Presence of artificial hip joint, bilateral: Secondary | ICD-10-CM | POA: Diagnosis not present

## 2022-08-17 DIAGNOSIS — G43909 Migraine, unspecified, not intractable, without status migrainosus: Secondary | ICD-10-CM | POA: Diagnosis not present

## 2022-08-17 DIAGNOSIS — Z471 Aftercare following joint replacement surgery: Secondary | ICD-10-CM | POA: Diagnosis not present

## 2022-08-17 DIAGNOSIS — R7303 Prediabetes: Secondary | ICD-10-CM | POA: Diagnosis not present

## 2022-08-17 DIAGNOSIS — K5900 Constipation, unspecified: Secondary | ICD-10-CM | POA: Diagnosis not present

## 2022-08-17 DIAGNOSIS — K219 Gastro-esophageal reflux disease without esophagitis: Secondary | ICD-10-CM | POA: Diagnosis not present

## 2022-08-17 DIAGNOSIS — M329 Systemic lupus erythematosus, unspecified: Secondary | ICD-10-CM | POA: Diagnosis not present

## 2022-08-17 DIAGNOSIS — M1711 Unilateral primary osteoarthritis, right knee: Secondary | ICD-10-CM | POA: Diagnosis not present

## 2022-08-17 DIAGNOSIS — Z6838 Body mass index (BMI) 38.0-38.9, adult: Secondary | ICD-10-CM | POA: Diagnosis not present

## 2022-08-17 DIAGNOSIS — Z96643 Presence of artificial hip joint, bilateral: Secondary | ICD-10-CM | POA: Diagnosis not present

## 2022-08-18 ENCOUNTER — Other Ambulatory Visit (HOSPITAL_BASED_OUTPATIENT_CLINIC_OR_DEPARTMENT_OTHER): Payer: Self-pay

## 2022-08-19 DIAGNOSIS — Z471 Aftercare following joint replacement surgery: Secondary | ICD-10-CM | POA: Diagnosis not present

## 2022-08-19 DIAGNOSIS — K5900 Constipation, unspecified: Secondary | ICD-10-CM | POA: Diagnosis not present

## 2022-08-19 DIAGNOSIS — G43909 Migraine, unspecified, not intractable, without status migrainosus: Secondary | ICD-10-CM | POA: Diagnosis not present

## 2022-08-19 DIAGNOSIS — M329 Systemic lupus erythematosus, unspecified: Secondary | ICD-10-CM | POA: Diagnosis not present

## 2022-08-19 DIAGNOSIS — R7303 Prediabetes: Secondary | ICD-10-CM | POA: Diagnosis not present

## 2022-08-19 DIAGNOSIS — M1711 Unilateral primary osteoarthritis, right knee: Secondary | ICD-10-CM | POA: Diagnosis not present

## 2022-08-19 DIAGNOSIS — K219 Gastro-esophageal reflux disease without esophagitis: Secondary | ICD-10-CM | POA: Diagnosis not present

## 2022-08-19 DIAGNOSIS — Z6838 Body mass index (BMI) 38.0-38.9, adult: Secondary | ICD-10-CM | POA: Diagnosis not present

## 2022-08-19 DIAGNOSIS — Z96643 Presence of artificial hip joint, bilateral: Secondary | ICD-10-CM | POA: Diagnosis not present

## 2022-08-25 DIAGNOSIS — M1712 Unilateral primary osteoarthritis, left knee: Secondary | ICD-10-CM | POA: Diagnosis not present

## 2022-09-15 ENCOUNTER — Other Ambulatory Visit: Payer: Self-pay

## 2022-09-15 ENCOUNTER — Other Ambulatory Visit (HOSPITAL_BASED_OUTPATIENT_CLINIC_OR_DEPARTMENT_OTHER): Payer: Self-pay

## 2022-10-12 ENCOUNTER — Telehealth: Payer: Self-pay | Admitting: Family Medicine

## 2022-10-12 NOTE — Telephone Encounter (Signed)
Pt is asking if it would be okay to schedule her CPE for one of the last appointments of the day (any day) ? Pt states she cannot get off work any earlier.  Pt also says she does not need blood work, as she gets her labs done by another provider.  Pt informed MD is OOO until next week.  Please advise.

## 2022-10-21 NOTE — Telephone Encounter (Signed)
Ok

## 2022-10-22 ENCOUNTER — Other Ambulatory Visit (HOSPITAL_BASED_OUTPATIENT_CLINIC_OR_DEPARTMENT_OTHER): Payer: Self-pay

## 2022-10-22 MED ORDER — TRAMADOL HCL 50 MG PO TABS
50.0000 mg | ORAL_TABLET | Freq: Three times a day (TID) | ORAL | 2 refills | Status: DC | PRN
  Filled 2022-10-22: qty 90, 30d supply, fill #0
  Filled 2022-11-23: qty 90, 30d supply, fill #1
  Filled 2022-12-21: qty 90, 30d supply, fill #2

## 2022-10-26 ENCOUNTER — Ambulatory Visit (INDEPENDENT_AMBULATORY_CARE_PROVIDER_SITE_OTHER): Payer: Commercial Managed Care - PPO | Admitting: Family Medicine

## 2022-10-26 ENCOUNTER — Encounter: Payer: Self-pay | Admitting: Family Medicine

## 2022-10-26 VITALS — BP 100/72 | HR 64 | Temp 97.8°F | Ht 69.5 in | Wt 250.0 lb

## 2022-10-26 DIAGNOSIS — Z1211 Encounter for screening for malignant neoplasm of colon: Secondary | ICD-10-CM

## 2022-10-26 DIAGNOSIS — Z903 Acquired absence of stomach [part of]: Secondary | ICD-10-CM | POA: Diagnosis not present

## 2022-10-26 DIAGNOSIS — Z0000 Encounter for general adult medical examination without abnormal findings: Secondary | ICD-10-CM

## 2022-10-26 DIAGNOSIS — Z23 Encounter for immunization: Secondary | ICD-10-CM

## 2022-10-26 LAB — POCT GLYCOSYLATED HEMOGLOBIN (HGB A1C): Hemoglobin A1C: 5.3 % (ref 4.0–5.6)

## 2022-10-26 NOTE — Progress Notes (Signed)
52 y.o. G40P1001 Single African American female here for annual exam.    Lost 35 pounds.   Dealing with hives and not sure of the cause, but thinks it could be her laundry detergent.   Takes calcium replacement daily.   Same partner for 11 years.   Had left knee replacement.  Back to work.  Walking several miles daily.   PCP:  Dr. Salomon Fick  Patient's last menstrual period was 04/29/2016.           Sexually active: Yes.    The current method of family planning is status post hysterectomy.    Exercising: Yes.     PT, walking 5 miles a day Smoker:  no  Health Maintenance: Pap:   08-24-15 Neg:Neg HR HPV.  Final cervical pathology specimen at hysterectomy was normal.  History of abnormal Pap:  no MMG:   12/02/21 Breast Density Cat A, BI-RADS CAT  1 neg Colonoscopy:  She will do this year.  BMD:   n/a  Result  n/a TDaP:  12/20/18 Gardasil:   no HIV: 12/28/18 NR Hep C: 12/28/18 neg Screening Labs:  PCP and rheumatology Flu vaccine:  completed.  Covid vaccine:  discussed.   reports that she has never smoked. She has never been exposed to tobacco smoke. She has never used smokeless tobacco. She reports that she does not drink alcohol and does not use drugs.  Past Medical History:  Diagnosis Date   Arthritis    osteoarthritis   Complication of anesthesia    bottom of feet itching   Elevated hemoglobin A1c 2018   6.1   GERD (gastroesophageal reflux disease)    Herpes simplex type II infection 2020   Lupus 2018   Dr.Deveshwar, currently in normal range    Past Surgical History:  Procedure Laterality Date   ABDOMINAL HYSTERECTOMY     CESAREAN SECTION  2000   CYSTOSCOPY N/A 07/06/2016   Procedure: CYSTOSCOPY;  Surgeon: Patton Salles, MD;  Location: [facility];  Service: Gynecology;  Laterality: N/A;   ingrown toenail Left 07/28/2019   JOINT REPLACEMENT  L   Left hip replacement   LAPAROSCOPIC GASTRIC SLEEVE RESECTION N/A 03/21/2018   Procedure: LAPAROSCOPIC GASTRIC  SLEEVE RESECTION, UPPER ENDO, ERAS Pathway;  Surgeon: Gaynelle Adu, MD;  Location: [facility];  Service: General;  Laterality: N/A;   OOPHORECTOMY  2007   Rt.oophorectomy    OVARIAN CYST SURGERY  ? and 10   rt removed,cyst off lft   TOTAL HIP ARTHROPLASTY  07/06/2011   Procedure: TOTAL HIP ARTHROPLASTY;  Surgeon: Nestor Lewandowsky, MD;  [facility]: [facility];  Service: Orthopedics;  Laterality: Left;  DEPUY/PINNACLE   TOTAL HIP ARTHROPLASTY Right 11/21/2018   Procedure: RIGHT TOTAL HIP ARTHROPLASTY ANTERIOR APPROACH;  Surgeon: Gean Birchwood, MD;  Location: [facility];  Service: Orthopedics;  Laterality: Right;   TOTAL KNEE ARTHROPLASTY Left 07/20/2022   Procedure: LEFT TOTAL KNEE ARTHROPLASTY;  Surgeon: Gean Birchwood, MD;  [facility]: [facility];  Service: Orthopedics;  Laterality: Left;   TOTAL LAPAROSCOPIC HYSTERECTOMY WITH SALPINGECTOMY Left 07/06/2016   Procedure: HYSTERECTOMY TOTAL LAPAROSCOPIC WITH Left SALPINGECTOMY, Suture Left Side Wall Laceration;  Surgeon: Patton Salles, MD;  Location: [facility];  Service: Gynecology;  Laterality: Left;    Current Outpatient Medications  Medication Sig Dispense Refill   acetaminophen-codeine (TYLENOL #3) 300-30 MG tablet Take 1-2 tablet by mouth every six to eight hours as needed for pain 60 tablet 0   aspirin EC 81 MG  tablet Take 1 tablet (81 mg total) by mouth 2 (two) times daily. 60 tablet 0   CASCARA SAGRADA PO Take 1 tablet by mouth daily.     Cholecalciferol (VITAMIN D) 125 MCG (5000 UT) CAPS Take 5,000 Units by mouth 3 (three) times a week.     diclofenac (VOLTAREN) 50 MG EC tablet Take 1 tablet (50 mg total) by mouth as needed for migraine. 20 tablet 0   diclofenac Sodium (VOLTAREN) 1 % GEL APPLY 2 TO 4 GRAMS TO AFFECTED JOINT UP TO 4 TIMES DAILY (Patient taking differently: Apply 2 g topically 4 (four) times daily as needed (pain).) 400 g 2   fexofenadine (ALLEGRA) 180 MG tablet Take 1 tablet (180 mg total) by mouth daily. 100 tablet 3   fluticasone (FLONASE)  50 MCG/ACT nasal spray Place 1 spray into both nostrils daily. (Patient taking differently: Place 1 spray into both nostrils daily as needed for allergies.) 16 g 7   Menthol, Topical Analgesic, (BIOFREEZE EX) Apply 1 application topically 2 (two) times daily as needed (pain).     Multiple Vitamins-Minerals (BARIATRIC MULTIVITAMINS/IRON PO) Take 1 tablet by mouth 2 (two) times daily.     Tetrahydrozoline HCl (VISINE OP) Place 1 drop into both eyes 3 (three) times daily as needed (for irritated/dry eyes.).     tiZANidine (ZANAFLEX) 2 MG tablet Take 1 tablet (2 mg total) by mouth every 6 (six) hours as needed for muscle spasms. 60 tablet 0   traMADol (ULTRAM) 50 MG tablet Take 1 tablet (50 mg total) by mouth 3 (three) times daily as needed. 90 tablet 2   traMADol (ULTRAM) 50 MG tablet Take 1 tablet (50 mg total) by mouth 3 (three) times daily as needed for pain. 90 tablet 2   No current facility-administered medications for this visit.    Family History  Problem Relation Age of Onset   Osteoarthritis Mother    Osteoarthritis Brother    Hypertension Brother    Diabetes Brother    Osteoarthritis Maternal Grandmother    Arthritis Maternal Grandmother     Review of Systems  All other systems reviewed and are negative.   Exam:   Ht 5\' 10"  (1.778 m)   Wt 248 lb (112.5 kg)   LMP 04/29/2016   BMI 35.58 kg/m     General appearance: alert, cooperative and appears stated age Head: normocephalic, without obvious abnormality, atraumatic Neck: no adenopathy, supple, symmetrical, trachea midline and thyroid normal to inspection and palpation Lungs: clear to auscultation bilaterally Breasts: normal appearance, no masses or tenderness, No nipple retraction or dimpling, No nipple discharge or bleeding, No axillary adenopathy Heart: regular rate and rhythm Abdomen: soft, non-tender; no masses, no organomegaly Extremities: extremities normal, atraumatic, no cyanosis or edema Skin: skin color,  texture, turgor normal. Hives on chest. Lymph nodes: cervical, supraclavicular, and axillary nodes normal. Neurologic: grossly normal  Pelvic: External genitalia:  no lesions              No abnormal inguinal nodes palpated.              Urethra:  normal appearing urethra with no masses, tenderness or lesions              Bartholins and Skenes: normal                 Vagina: normal appearing vagina with normal color and discharge, no lesions              Cervix: absent  Pap taken: no Bimanual Exam:  Uterus:  absent              Adnexa: no mass, fullness, tenderness              Rectal exam: yes.  Confirms.              Anus:  normal sphincter tone, no lesions  Chaperone was present for exam:  Warren Lacy, CMA  Assessment:   Well woman visit with gynecologic exam. Status post gastric sleeve surgery.  Status post RSO.  Status post total laparoscopic hysterectomy with left salpingectomy/cystoscopy.  Still has left ovary.  Lupus.   Arthritic pain.  On Plaquenil.  Hx AODM.  Normal A1C.  HSV II. Colon cancer screening.   Plan: Mammogram screening discussed. Self breast awareness reviewed. Pap and HR HPV as above. Guidelines for Calcium, Vitamin D, regular exercise program including cardiovascular and weight bearing exercise. Referral for colonoscopy.   Follow up annually and prn.

## 2022-10-26 NOTE — Progress Notes (Signed)
Established Patient Office Visit   Subjective  Patient ID: Julia Monroe, female    DOB: 02-Oct-1970  Age: 52 y.o. MRN: [id number]  Chief Complaint  Patient presents with   Annual Exam    Pt is a 52 yo female seen for CPE.  Patient states she has been doing well overall.  Had left TKR over the summer.  Pt never started semaglutide.  walking 3 miles in the morning and 1 mile at lunch.  Patient endorses weight loss.  Has not had any swelling in left knee since procedure.  Has Pap with OB/GYN next week.  Would like to set up colonoscopy.  Interested in influenza vaccine.    Patient Active Problem List   Diagnosis Date Noted   Degenerative arthritis of left knee 07/10/2022   H/O gastric sleeve 02/12/2022   History of total hip arthroplasty, right 11/21/2018   Prediabetes 03/21/2018   SLE (systemic lupus erythematosus) (HCC) 03/21/2018   Obesity 03/21/2018   History of migraine 08/27/2017   Class 3 severe obesity due to excess calories without serious comorbidity with body mass index (BMI) of 50.0 to 59.9 in adult [facility]) 01/27/2017   Status post bilateral total hip replacement 10/27/2016   Hyperglycemia 08/25/2016   Leukocytosis 08/13/2016   Status post laparoscopic hysterectomy 07/06/2016   Primary osteoarthritis of both knees 04/29/2016   ANA positive 04/29/2016   Primary osteoarthritis of right hip 07/10/2011   Past Medical History:  Diagnosis Date   Arthritis    osteoarthritis   Complication of anesthesia    bottom of feet itching   Elevated hemoglobin A1c 2018   6.1   GERD (gastroesophageal reflux disease)    Herpes simplex type II infection 2020   Lupus (HCC) 2018   Dr.Deveshwar, currently in normal range   Past Surgical History:  Procedure Laterality Date   ABDOMINAL HYSTERECTOMY     CESAREAN SECTION  2000   CYSTOSCOPY N/A 07/06/2016   Procedure: CYSTOSCOPY;  Surgeon: Patton Salles, MD;  [facility];  Service: Gynecology;  Laterality: N/A;    ingrown toenail Left 07/28/2019   JOINT REPLACEMENT  L   Left hip replacement   LAPAROSCOPIC GASTRIC SLEEVE RESECTION N/A 03/21/2018   Procedure: LAPAROSCOPIC GASTRIC SLEEVE RESECTION, UPPER ENDO, ERAS Pathway;  Surgeon: Gaynelle Adu, MD;  [facility];  Service: General;  Laterality: N/A;   OOPHORECTOMY  2007   Rt.oophorectomy    OVARIAN CYST SURGERY  ? and 10   rt removed,cyst off lft   TOTAL HIP ARTHROPLASTY  07/06/2011   Procedure: TOTAL HIP ARTHROPLASTY;  Surgeon: Nestor Lewandowsky, MD;  [facility];  Service: Orthopedics;  Laterality: Left;  DEPUY/PINNACLE   TOTAL HIP ARTHROPLASTY Right 11/21/2018   Procedure: RIGHT TOTAL HIP ARTHROPLASTY ANTERIOR APPROACH;  Surgeon: Gean Birchwood, MD;  [facility];  Service: Orthopedics;  Laterality: Right;   TOTAL KNEE ARTHROPLASTY Left 07/20/2022   Procedure: LEFT TOTAL KNEE ARTHROPLASTY;  Surgeon: Gean Birchwood, MD;  [facility];  Service: Orthopedics;  Laterality: Left;   TOTAL LAPAROSCOPIC HYSTERECTOMY WITH SALPINGECTOMY Left 07/06/2016   Procedure: HYSTERECTOMY TOTAL LAPAROSCOPIC WITH Left SALPINGECTOMY, Suture Left Side Wall Laceration;  Surgeon: Patton Salles, MD;  [facility];  Service: Gynecology;  Laterality: Left;   Social History   Tobacco Use   Smoking status: Never    Passive exposure: Never   Smokeless tobacco: Never  Vaping Use   Vaping status: Never Used  Substance Use  Topics   Alcohol use: No    Alcohol/week: 0.0 standard drinks of alcohol   Drug use: No   Family History  Problem Relation Age of Onset   Osteoarthritis Mother    Osteoarthritis Brother    Hypertension Brother    Diabetes Brother    Osteoarthritis Maternal Grandmother    Arthritis Maternal Grandmother    Allergies  Allergen Reactions   Monosodium Glutamate Other (See Comments)    Loses vision, headache/migraine   Hydromorphone     Makes patient "Loopy" and dreams terrible things   Lactose Intolerance (Gi) Other (See  Comments)    Gi upset   Levocetirizine Itching   Other      Blood Product Refusal    Caffeine Other (See Comments)    Severe headache    Hydrocodone Other (See Comments)    Headaches/migraines    Oxycodone Other (See Comments)    Headache/Migraine      ROS Negative unless stated above    Objective:     BP 100/72 (BP Location: Left Arm, Patient Position: Sitting, Cuff Size: Large)   Pulse 64   Temp 97.8 F (36.6 C) (Oral)   Ht 5' 9.5" (1.765 m)   Wt 250 lb (113.4 kg)   LMP 04/29/2016   SpO2 96%   BMI 36.39 kg/m  BP Readings from Last 3 Encounters:  10/26/22 100/72  07/20/22 139/81  07/10/22 (!) 140/77   Wt Readings from Last 3 Encounters:  10/26/22 250 lb (113.4 kg)  07/20/22 234 lb (106.1 kg)  07/10/22 234 lb (106.1 kg)      Physical Exam Constitutional:      Appearance: Normal appearance.  HENT:     Head: Normocephalic and atraumatic.     Right Ear: Tympanic membrane, ear canal and external ear normal.     Left Ear: Tympanic membrane, ear canal and external ear normal.     Nose: Nose normal.     Mouth/Throat:     Mouth: Mucous membranes are moist.     Pharynx: No oropharyngeal exudate or posterior oropharyngeal erythema.  Eyes:     General: No scleral icterus.    Extraocular Movements: Extraocular movements intact.     Conjunctiva/sclera: Conjunctivae normal.     Pupils: Pupils are equal, round, and reactive to light.  Neck:     Thyroid: No thyromegaly.  Cardiovascular:     Rate and Rhythm: Normal rate and regular rhythm.     Pulses: Normal pulses.     Heart sounds: Normal heart sounds. No murmur heard.    No friction rub.  Pulmonary:     Effort: Pulmonary effort is normal.     Breath sounds: Normal breath sounds. No wheezing, rhonchi or rales.  Abdominal:     General: Bowel sounds are normal.     Palpations: Abdomen is soft.     Tenderness: There is no abdominal tenderness.  Musculoskeletal:        General: No deformity. Normal range of  motion.     Comments: Wearing compression sleeve on LLE.  Lymphadenopathy:     Cervical: No cervical adenopathy.  Skin:    General: Skin is warm and dry.     Findings: No lesion.     Comments: Well healed surgical incision midline L knee.  Neurological:     General: No focal deficit present.     Mental Status: She is alert and oriented to person, place, and time.  Psychiatric:  Mood and Affect: Mood normal.        Thought Content: Thought content normal.     Results for orders placed or performed in visit on 10/26/22  POC HgB A1c  Result Value Ref Range   Hemoglobin A1C 5.3 4.0 - 5.6 %   HbA1c POC (<> result, manual entry)     HbA1c, POC (prediabetic range)     HbA1c, POC (controlled diabetic range)        Assessment & Plan:  Well adult exam  Screen for colon cancer -     Ambulatory referral to Gastroenterology  Need for influenza vaccination -     Flu vaccine trivalent PF, 6mos and older(Flulaval,Afluria,Fluarix,Fluzone)  H/O gastric sleeve -     POCT glycosylated hemoglobin (Hb A1C)  Age-appropriate health screenings discussed.  Recent labs reviewed including cholesterol, CMP, CBC.  No need to repeat at this time.  Will obtain hemoglobin A1c was 5.3% on 12/11/2020.  Hemoglobin A1c remains at 5.3%.  Immunizations reviewed.  Pap to be done next week with OB/GYN.  Mammogram done 12/02/2021.  Influenza vaccine given this visit.  Referral placed to GI for colonoscopy.  Return if symptoms worsen or fail to improve.   Deeann Saint, MD

## 2022-10-27 NOTE — Telephone Encounter (Signed)
Pt completed requested visit on 10/26/22.

## 2022-11-09 ENCOUNTER — Ambulatory Visit (INDEPENDENT_AMBULATORY_CARE_PROVIDER_SITE_OTHER): Payer: Commercial Managed Care - PPO | Admitting: Obstetrics and Gynecology

## 2022-11-09 ENCOUNTER — Encounter: Payer: Self-pay | Admitting: Obstetrics and Gynecology

## 2022-11-09 VITALS — BP 126/72 | HR 65 | Ht 70.0 in | Wt 248.0 lb

## 2022-11-09 DIAGNOSIS — Z1211 Encounter for screening for malignant neoplasm of colon: Secondary | ICD-10-CM

## 2022-11-09 DIAGNOSIS — Z01419 Encounter for gynecological examination (general) (routine) without abnormal findings: Secondary | ICD-10-CM

## 2022-11-09 NOTE — Patient Instructions (Signed)

## 2022-11-11 ENCOUNTER — Ambulatory Visit: Payer: Commercial Managed Care - PPO | Admitting: Family Medicine

## 2022-11-11 ENCOUNTER — Encounter: Payer: Self-pay | Admitting: Family Medicine

## 2022-11-11 VITALS — BP 120/72 | HR 85

## 2022-11-11 DIAGNOSIS — L245 Irritant contact dermatitis due to other chemical products: Secondary | ICD-10-CM | POA: Diagnosis not present

## 2022-11-11 DIAGNOSIS — L299 Pruritus, unspecified: Secondary | ICD-10-CM

## 2022-11-11 DIAGNOSIS — L509 Urticaria, unspecified: Secondary | ICD-10-CM | POA: Diagnosis not present

## 2022-11-11 NOTE — Progress Notes (Signed)
Established Patient Office Visit   Subjective  Patient ID: Julia Monroe, female    DOB: 1971/01/02  Age: 52 y.o. MRN: [id number]  Chief Complaint  Patient presents with   Allergies    Patient is itching all over     Patient is a 52 year old female seen for acute concern.  Patient endorses full body pruritus and intermittent hives x 1 week.  Patient denies changes in foods, soaps, lotions, detergents as she typically makes her own cleaning products.  Patient did use a different scent of the scent beads in laundry.  Patient's daughter who is in the household does not have any symptoms.  Patient denies having pets.    Patient Active Problem List   Diagnosis Date Noted   Degenerative arthritis of left knee 07/10/2022   H/O gastric sleeve 02/12/2022   History of total hip arthroplasty, right 11/21/2018   Prediabetes 03/21/2018   SLE (systemic lupus erythematosus) (HCC) 03/21/2018   Obesity 03/21/2018   History of migraine 08/27/2017   Class 3 severe obesity due to excess calories without serious comorbidity with body mass index (BMI) of 50.0 to 59.9 in adult [facility]) 01/27/2017   Status post bilateral total hip replacement 10/27/2016   Hyperglycemia 08/25/2016   Leukocytosis 08/13/2016   Status post laparoscopic hysterectomy 07/06/2016   Primary osteoarthritis of both knees 04/29/2016   ANA positive 04/29/2016   Primary osteoarthritis of right hip 07/10/2011   Past Medical History:  Diagnosis Date   Arthritis    osteoarthritis   Complication of anesthesia    bottom of feet itching   Elevated hemoglobin A1c 2018   6.1   GERD (gastroesophageal reflux disease)    Herpes simplex type II infection 2020   Lupus 2018   Dr.Deveshwar, currently in normal range   Past Surgical History:  Procedure Laterality Date   ABDOMINAL HYSTERECTOMY     CESAREAN SECTION  2000   CYSTOSCOPY N/A 07/06/2016   Procedure: CYSTOSCOPY;  Surgeon: Patton Salles, MD;  Location: [facility];   Service: Gynecology;  Laterality: N/A;   ingrown toenail Left 07/28/2019   JOINT REPLACEMENT  L   Left hip replacement   LAPAROSCOPIC GASTRIC SLEEVE RESECTION N/A 03/21/2018   Procedure: LAPAROSCOPIC GASTRIC SLEEVE RESECTION, UPPER ENDO, ERAS Pathway;  Surgeon: Gaynelle Adu, MD;  [facility];  Service: General;  Laterality: N/A;   OOPHORECTOMY  2007   Rt.oophorectomy    OVARIAN CYST SURGERY  ? and 10   rt removed,cyst off lft   TOTAL HIP ARTHROPLASTY  07/06/2011   Procedure: TOTAL HIP ARTHROPLASTY;  Surgeon: Nestor Lewandowsky, MD;  Location: [facility];  Service: Orthopedics;  Laterality: Left;  DEPUY/PINNACLE   TOTAL HIP ARTHROPLASTY Right 11/21/2018   Procedure: RIGHT TOTAL HIP ARTHROPLASTY ANTERIOR APPROACH;  Surgeon: Gean Birchwood, MD;  [facility];  Service: Orthopedics;  Laterality: Right;   TOTAL KNEE ARTHROPLASTY Left 07/20/2022   Procedure: LEFT TOTAL KNEE ARTHROPLASTY;  Surgeon: Gean Birchwood, MD;  [facility];  Service: Orthopedics;  Laterality: Left;   TOTAL LAPAROSCOPIC HYSTERECTOMY WITH SALPINGECTOMY Left 07/06/2016   Procedure: HYSTERECTOMY TOTAL LAPAROSCOPIC WITH Left SALPINGECTOMY, Suture Left Side Wall Laceration;  Surgeon: Patton Salles, MD;  [facility];  Service: Gynecology;  Laterality: Left;   Social History   Tobacco Use   Smoking status: Never    Passive exposure: Never   Smokeless tobacco: Never  Vaping Use   Vaping status: Never Used  Substance Use Topics  Alcohol use: No    Alcohol/week: 0.0 standard drinks of alcohol   Drug use: No   Family History  Problem Relation Age of Onset   Osteoarthritis Mother    Osteoarthritis Brother    Hypertension Brother    Diabetes Brother    Osteoarthritis Maternal Grandmother    Arthritis Maternal Grandmother    Allergies  Allergen Reactions   Monosodium Glutamate Other (See Comments)    Loses vision, headache/migraine   Hydromorphone     Makes patient "Loopy" and dreams terrible things    Lactose Intolerance (Gi) Other (See Comments)    Gi upset   Levocetirizine Itching   Other      Blood Product Refusal    Caffeine Other (See Comments)    Severe headache    Hydrocodone Other (See Comments)    Headaches/migraines    Oxycodone Other (See Comments)    Headache/Migraine      ROS Negative unless stated above    Objective:     BP 120/72 (BP Location: Left Arm, Patient Position: Sitting, Cuff Size: Large)   Pulse 85   LMP 04/29/2016   SpO2 97%  BP Readings from Last 3 Encounters:  11/11/22 120/72  11/09/22 126/72  10/26/22 100/72   Wt Readings from Last 3 Encounters:  11/09/22 248 lb (112.5 kg)  10/26/22 250 lb (113.4 kg)  07/20/22 234 lb (106.1 kg)      Physical Exam Constitutional:      General: She is not in acute distress.    Appearance: Normal appearance.  HENT:     Head: Normocephalic and atraumatic.     Nose: Nose normal.     Mouth/Throat:     Mouth: Mucous membranes are moist.  Cardiovascular:     Rate and Rhythm: Normal rate and regular rhythm.     Heart sounds: Normal heart sounds. No murmur heard.    No gallop.  Pulmonary:     Effort: Pulmonary effort is normal. No respiratory distress.     Breath sounds: Normal breath sounds. No wheezing, rhonchi or rales.  Skin:    General: Skin is warm and dry.     Comments: Erythema of chest.  Neurological:     Mental Status: She is alert and oriented to person, place, and time.     No results found for any visits on 11/11/22.    Assessment & Plan:  Pruritus  Irritant contact dermatitis due to other chemical products  Hives  Acute dermatitis this likely 2/2 using a different scent beads.  Patient continued to have symptoms due to contact with close and items previously washed using the scent beads.  Patient advised to rewash linens, towels, clothing without scent beads.  Can use Allegra or other OTC antihistamine to help with pruritus.  Advised to follow-up for worsening or continued  symptoms.   Return if symptoms worsen or fail to improve.   Deeann Saint, MD

## 2022-11-18 ENCOUNTER — Encounter: Payer: Self-pay | Admitting: Family Medicine

## 2022-11-23 ENCOUNTER — Other Ambulatory Visit (HOSPITAL_BASED_OUTPATIENT_CLINIC_OR_DEPARTMENT_OTHER): Payer: Self-pay

## 2022-11-26 ENCOUNTER — Other Ambulatory Visit: Payer: Self-pay | Admitting: *Deleted

## 2022-11-26 DIAGNOSIS — Z79899 Other long term (current) drug therapy: Secondary | ICD-10-CM

## 2022-11-26 DIAGNOSIS — M3219 Other organ or system involvement in systemic lupus erythematosus: Secondary | ICD-10-CM

## 2022-12-01 NOTE — Progress Notes (Addendum)
 Office Visit Note  Patient: Julia Monroe             Date of Birth: February 25, 1970           MRN: [id number]             PCP: Viola Greulich, MD Referring: Viola Greulich, MD Visit Date: 12/10/2022 Occupation: @GUAROCC @  Subjective:  Joint stiffness  History of Present Illness: Julia Monroe is a 52 y.o. 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  Patient states she had left total knee replacement on July 20, 2022 by Dr.Rowan.  She has been going to physical therapy and is recovering well.  She still has some warmth and swelling in her left knee.  Right knee joint is not bothersome currently.  Elbow joint pain has resolved.  She continues to have some trapezius spasm.  She denies any history of oral ulcers, nasal ulcers, malar rash, photosensitivity, Raynaud's phenomenon or lymphadenopathy.  She denies any history of inflammatory arthritis.  She states she developed a rash due to allergic reaction few days back which resolved.    Activities of Daily Living:  Patient reports morning stiffness for a few minutes.   Patient Reports nocturnal pain.  Difficulty dressing/grooming: Denies Difficulty climbing stairs: Denies Difficulty getting out of chair: Denies Difficulty using hands for taps, buttons, cutlery, and/or writing: Denies  Review of Systems  Constitutional:  Negative for fatigue.  HENT:  Negative for mouth sores and mouth dryness.   Eyes:  Negative for dryness.  Respiratory:  Negative for shortness of breath.   Cardiovascular:  Negative for chest pain and palpitations.  Gastrointestinal:  Negative for blood in stool, constipation and diarrhea.  Endocrine: Negative for increased urination.  Genitourinary:  Negative for involuntary urination.  Musculoskeletal:  Positive for morning stiffness. Negative for joint pain, gait problem, joint pain, joint swelling, myalgias, muscle weakness, muscle tenderness and myalgias.  Skin:   Positive for rash. Negative for color change, hair loss and sensitivity to sunlight.  Allergic/Immunologic: Negative for susceptible to infections.  Neurological:  Negative for dizziness and headaches.  Hematological:  Negative for swollen glands.  Psychiatric/Behavioral:  Negative for depressed mood and sleep disturbance. The patient is not nervous/anxious.     PMFS History:  Patient Active Problem List   Diagnosis Date Noted   Degenerative arthritis of left knee 07/10/2022   H/O gastric sleeve 02/12/2022   History of total hip arthroplasty, right 11/21/2018   Prediabetes 03/21/2018   SLE (systemic lupus erythematosus) (HCC) 03/21/2018   Obesity 03/21/2018   History of migraine 08/27/2017   Class 3 severe obesity due to excess calories without serious comorbidity with body mass index (BMI) of 50.0 to 59.9 in adult [facility]) 01/27/2017   Status post bilateral total hip replacement 10/27/2016   Hyperglycemia 08/25/2016   Leukocytosis 08/13/2016   Status post laparoscopic hysterectomy 07/06/2016   Primary osteoarthritis of both knees 04/29/2016   ANA positive 04/29/2016   Primary osteoarthritis of right hip 07/10/2011    Past Medical History:  Diagnosis Date   Arthritis    osteoarthritis   Complication of anesthesia    bottom of feet itching   Elevated hemoglobin A1c 2018   6.1   GERD (gastroesophageal reflux disease)    Herpes simplex type II infection 2020   Lupus 2018   Dr.Camella Seim, currently in normal range    Family History  Problem Relation Age of Onset   Osteoarthritis Mother    Osteoarthritis Brother  Hypertension Brother    Diabetes Brother    Osteoarthritis Maternal Grandmother    Arthritis Maternal Grandmother    Past Surgical History:  Procedure Laterality Date   ABDOMINAL HYSTERECTOMY     CESAREAN SECTION  2000   CYSTOSCOPY N/A 07/06/2016   Procedure: CYSTOSCOPY;  Surgeon: Greta Leatherwood, MD;  Location: [facility];  Service: Gynecology;   Laterality: N/A;   ingrown toenail Left 07/28/2019   JOINT REPLACEMENT  L   Left hip replacement   LAPAROSCOPIC GASTRIC SLEEVE RESECTION N/A 03/21/2018   Procedure: LAPAROSCOPIC GASTRIC SLEEVE RESECTION, UPPER ENDO, ERAS Pathway;  Surgeon: Aldean Hummingbird, MD;  Location: [facility];  Service: General;  Laterality: N/A;   OOPHORECTOMY  2007   Rt.oophorectomy    OVARIAN CYST SURGERY  ? and 10   rt removed,cyst off lft   TOTAL HIP ARTHROPLASTY  07/06/2011   Procedure: TOTAL HIP ARTHROPLASTY;  Surgeon: Ilean Mall, MD;  Location: [facility];  Service: Orthopedics;  Laterality: Left;  DEPUY/PINNACLE   TOTAL HIP ARTHROPLASTY Right 11/21/2018   Procedure: RIGHT TOTAL HIP ARTHROPLASTY ANTERIOR APPROACH;  Surgeon: Wendolyn Hamburger, MD;  Location: [facility];  Service: Orthopedics;  Laterality: Right;   TOTAL KNEE ARTHROPLASTY Left 07/20/2022   Procedure: LEFT TOTAL KNEE ARTHROPLASTY;  Surgeon: Wendolyn Hamburger, MD;  Location: [facility];  Service: Orthopedics;  Laterality: Left;   TOTAL LAPAROSCOPIC HYSTERECTOMY WITH SALPINGECTOMY Left 07/06/2016   Procedure: HYSTERECTOMY TOTAL LAPAROSCOPIC WITH Left SALPINGECTOMY, Suture Left Side Wall Laceration;  Surgeon: Greta Leatherwood, MD;  Location: [facility];  Service: Gynecology;  Laterality: Left;   Social History   Social History Narrative   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[employer]      Home Situation: 65 yo daughter in 2018      Spiritual Beliefs:      Lifestyle: starting healthy low sugar diet 08/2016/considering exercising at the [employer] History  Administered Date(s) Administered   Influenza, Seasonal, Injecte, Preservative Fre 10/26/2022   Influenza-Unspecified 10/26/2013, 10/26/2016   PFIZER Comirnaty(Gray Top)Covid-19 Tri-Sucrose Vaccine 08/02/2020   PFIZER(Purple Top)SARS-COV-2 Vaccination 04/28/2019, 05/23/2019, 01/12/2020   Pneumococcal Conjugate-13 11/02/2016   Tdap 01/26/2009, 12/20/2018     Objective: Vital Signs: BP 125/84 (BP Location: Left Arm,  Patient Position: Sitting, Cuff Size: Large)   Pulse 62   Resp 15   Ht 5' 9.5" (1.765 m)   Wt 246 lb (111.6 kg)   LMP 04/29/2016   BMI 35.81 kg/m    Physical Exam Vitals and nursing note reviewed.  Constitutional:      Appearance: She is well-developed.  HENT:     Head: Normocephalic and atraumatic.  Eyes:     Conjunctiva/sclera: Conjunctivae normal.  Cardiovascular:     Rate and Rhythm: Normal rate and regular rhythm.     Heart sounds: Normal heart sounds.  Pulmonary:     Effort: Pulmonary effort is normal.     Breath sounds: Normal breath sounds.  Abdominal:     General: Bowel sounds are normal.     Palpations: Abdomen is soft.  Musculoskeletal:     Cervical back: Normal range of motion.  Lymphadenopathy:     Cervical: No cervical adenopathy.  Skin:    General: Skin is warm and dry.     Capillary Refill: Capillary refill takes less than 2 seconds.  Neurological:     Mental Status: She is alert and oriented to person, place, and time.  Psychiatric:  Behavior: Behavior normal.      Musculoskeletal Exam: Cervical spine was in good range of motion.  Shoulder joints, elbow joints, wrist joints, MCPs PIPs and DIPs with good range of motion with no synovitis.  Hip joints were replaced and were in good range of motion.  Left knee joint was replaced and warm to touch with limited extension.  Right knee joint was in good range of motion without any warmth swelling or effusion.  There was no tenderness over ankles or MTPs.  CDAI Exam: CDAI Score: -- Patient Global: --; Provider Global: -- Swollen: --; Tender: -- Joint Exam 12/10/2022   No joint exam has been documented for this visit   There is currently no information documented on the homunculus. Go to the Rheumatology activity and complete the homunculus joint exam.  Investigation: No additional findings.  Imaging: MM 3D SCREENING MAMMOGRAM BILATERAL BREAST  Result Date: 12/08/2022 CLINICAL DATA:  Screening.  EXAM: DIGITAL SCREENING BILATERAL MAMMOGRAM WITH TOMOSYNTHESIS AND CAD TECHNIQUE: Bilateral screening digital craniocaudal and mediolateral oblique mammograms were obtained. Bilateral screening digital breast tomosynthesis was performed. The images were evaluated with computer-aided detection. COMPARISON:  Previous exam(s). ACR Breast Density Category b: There are scattered areas of fibroglandular density. FINDINGS: There are no findings suspicious for malignancy. IMPRESSION: No mammographic evidence of malignancy. A result letter of this screening mammogram will be mailed directly to the patient. RECOMMENDATION: Screening mammogram in one year. (Code:SM-B-01Y) BI-RADS CATEGORY  1: Negative. Electronically Signed   By: Amanda Jungling M.D.   On: 12/08/2022 11:59    Recent Labs: Lab Results  Component Value Date   WBC 6.8 12/02/2022   HGB 13.4 12/02/2022   PLT 250 12/02/2022   NA 139 12/02/2022   K 4.1 12/02/2022   CL 104 12/02/2022   CO2 29 12/02/2022   GLUCOSE 70 12/02/2022   BUN 12 12/02/2022   CREATININE 0.53 12/02/2022   BILITOT 0.5 12/02/2022   ALKPHOS 49 05/11/2018   AST 16 12/02/2022   ALT 10 12/02/2022   PROT 7.1 12/02/2022   ALBUMIN  4.1 05/11/2018   CALCIUM 9.2 12/02/2022   GFRAA 101 07/05/2020    Speciality Comments: PLQ eye exam: 11/29/2020 [facility] [employer]. Follow up in 1 year.    Procedures:  No procedures performed Allergies: Monosodium glutamate, Hydromorphone , Lactose intolerance (gi), Levocetirizine, Other, Caffeine, Hydrocodone , and Oxycodone    Assessment / Plan:     Visit Diagnoses: Undifferentiated connective tissue disease- History of positive ANA, DS DNA, elevated ESR, arthralgias, rash: Patient has been off Plaquenil  since November 2022.  She has not noticed any difference in her symptoms.  She denies any history of flare.  There is no history of oral ulcers, nasal ulcers, malar rash, photosensitivity, Raynaud's, lymphadenopathy or inflammatory  arthritis.  Labs obtained on December 02, 2022 CBC and CMP were normal.  ANA negative, double-stranded DNA positive at 18, complements normal, sed rate normal.  Will repeat labs in 6 months.  Patient was advised to notify us  if she develops any new symptoms.  Use of sunscreen was advised.  High risk medication use - She discontinued Plaquenil  about 2 years ago.  She is not currently taking any immunosuppressive agents  Lateral epicondylitis, left elbow-resolved.  Status post bilateral total hip replacement-she had good range of motion without discomfort.  Primary osteoarthritis of right knee-currently asymptomatic.  She had good range of motion without any warmth swelling or effusion.  Status post total knee replacement, left-July 20, 2022 by Dr. Carry Clapper.  Patient  still has some warmth and has limited extension.  Trapezius muscle spasm-currently not bothersome.  Other fatigue  Vitamin D  deficiency  History of migraine  Orders: Orders Placed This Encounter  Procedures   Protein / creatinine ratio, urine   CBC with Differential/Platelet   COMPLETE METABOLIC PANEL WITH GFR   ANA   Anti-DNA antibody, double-stranded   C3 and C4   Sedimentation rate   No orders of the defined types were placed in this encounter.    Follow-Up Instructions: Return in about 6 months (around 06/09/2023) for Systemic lupus, Osteoarthritis.   Nicholas Bari, MD  Note - This record has been created using Animal nutritionist.  Chart creation errors have been sought, but may not always  have been located. Such creation errors do not reflect on  the standard of medical care.

## 2022-12-02 ENCOUNTER — Other Ambulatory Visit: Payer: Self-pay

## 2022-12-02 DIAGNOSIS — M3219 Other organ or system involvement in systemic lupus erythematosus: Secondary | ICD-10-CM | POA: Diagnosis not present

## 2022-12-02 DIAGNOSIS — Z79899 Other long term (current) drug therapy: Secondary | ICD-10-CM | POA: Diagnosis not present

## 2022-12-02 NOTE — Progress Notes (Signed)
CBC WNL

## 2022-12-03 LAB — CBC WITH DIFFERENTIAL/PLATELET
Absolute Lymphocytes: 1816 {cells}/uL (ref 850–3900)
Absolute Monocytes: 496 {cells}/uL (ref 200–950)
Basophils Absolute: 41 {cells}/uL (ref 0–200)
Basophils Relative: 0.6 %
Eosinophils Absolute: 381 {cells}/uL (ref 15–500)
Eosinophils Relative: 5.6 %
HCT: 41.8 % (ref 35.0–45.0)
Hemoglobin: 13.4 g/dL (ref 11.7–15.5)
MCH: 28.2 pg (ref 27.0–33.0)
MCHC: 32.1 g/dL (ref 32.0–36.0)
MCV: 87.8 fL (ref 80.0–100.0)
MPV: 10.4 fL (ref 7.5–12.5)
Monocytes Relative: 7.3 %
Neutro Abs: 4066 {cells}/uL (ref 1500–7800)
Neutrophils Relative %: 59.8 %
Platelets: 250 10*3/uL (ref 140–400)
RBC: 4.76 10*6/uL (ref 3.80–5.10)
RDW: 12.8 % (ref 11.0–15.0)
Total Lymphocyte: 26.7 %
WBC: 6.8 10*3/uL (ref 3.8–10.8)

## 2022-12-03 LAB — PROTEIN / CREATININE RATIO, URINE
Creatinine, Urine: 49 mg/dL (ref 20–275)
Total Protein, Urine: <4 mg/dL — ABNORMAL LOW (ref 5–24)

## 2022-12-03 LAB — COMPLETE METABOLIC PANEL WITH GFR
AG Ratio: 1.4 (calc) (ref 1.0–2.5)
ALT: 10 U/L (ref 6–29)
AST: 16 U/L (ref 10–35)
Albumin: 4.2 g/dL (ref 3.6–5.1)
Alkaline phosphatase (APISO): 51 U/L (ref 37–153)
BUN: 12 mg/dL (ref 7–25)
CO2: 29 mmol/L (ref 20–32)
Calcium: 9.2 mg/dL (ref 8.6–10.4)
Chloride: 104 mmol/L (ref 98–110)
Creat: 0.53 mg/dL (ref 0.50–1.03)
Globulin: 2.9 g/dL (ref 1.9–3.7)
Glucose, Bld: 70 mg/dL (ref 65–99)
Potassium: 4.1 mmol/L (ref 3.5–5.3)
Sodium: 139 mmol/L (ref 135–146)
Total Bilirubin: 0.5 mg/dL (ref 0.2–1.2)
Total Protein: 7.1 g/dL (ref 6.1–8.1)
eGFR: 113 mL/min/{1.73_m2} (ref >=60)

## 2022-12-03 LAB — C3 AND C4
C3 Complement: 125 mg/dL (ref 83–193)
C4 Complement: 37 mg/dL (ref 15–57)

## 2022-12-03 LAB — VITAMIN B12: Vitamin B-12: 1025 pg/mL (ref 200–1100)

## 2022-12-03 LAB — ANTI-DNA ANTIBODY, DOUBLE-STRANDED: ds DNA Ab: 18 [IU]/mL — ABNORMAL HIGH

## 2022-12-03 LAB — ANA: Anti Nuclear Antibody (ANA): NEGATIVE

## 2022-12-03 LAB — SEDIMENTATION RATE: Sed Rate: 11 mm/h (ref 0–20)

## 2022-12-03 LAB — IRON,TIBC AND FERRITIN PANEL
%SAT: 39 % (ref 16–45)
Ferritin: 86 ng/mL (ref 16–232)
Iron: 100 ug/dL (ref 45–160)
TIBC: 259 ug/dL (ref 250–450)

## 2022-12-03 NOTE — Progress Notes (Signed)
Iron studies normal.  B12 normal

## 2022-12-03 NOTE — Progress Notes (Signed)
No proteinuria.  CMP WNL.  ESR WNL.

## 2022-12-03 NOTE — Progress Notes (Signed)
Complements WNL

## 2022-12-04 ENCOUNTER — Other Ambulatory Visit (HOSPITAL_BASED_OUTPATIENT_CLINIC_OR_DEPARTMENT_OTHER): Payer: Self-pay | Admitting: Obstetrics and Gynecology

## 2022-12-04 ENCOUNTER — Ambulatory Visit (HOSPITAL_BASED_OUTPATIENT_CLINIC_OR_DEPARTMENT_OTHER)
Admission: RE | Admit: 2022-12-04 | Discharge: 2022-12-04 | Disposition: A | Discharge status: Home / Self Care | Payer: Commercial Managed Care - PPO | Source: Ambulatory Visit | Attending: Obstetrics and Gynecology | Admitting: Obstetrics and Gynecology

## 2022-12-04 ENCOUNTER — Encounter (HOSPITAL_BASED_OUTPATIENT_CLINIC_OR_DEPARTMENT_OTHER): Payer: Self-pay | Admitting: Radiology

## 2022-12-04 DIAGNOSIS — Z1231 Encounter for screening mammogram for malignant neoplasm of breast: Secondary | ICD-10-CM

## 2022-12-04 NOTE — Progress Notes (Signed)
ANA negative. dsDNA remains positive. Rest of lab work is not consistent with a flare. We will continue to monitor lab work closely

## 2022-12-10 ENCOUNTER — Encounter: Payer: Self-pay | Admitting: Rheumatology

## 2022-12-10 ENCOUNTER — Ambulatory Visit: Payer: Commercial Managed Care - PPO | Attending: Rheumatology | Admitting: Rheumatology

## 2022-12-10 VITALS — BP 125/84 | HR 62 | Resp 15 | Ht 69.5 in | Wt 246.0 lb

## 2022-12-10 DIAGNOSIS — Z96652 Presence of left artificial knee joint: Secondary | ICD-10-CM

## 2022-12-10 DIAGNOSIS — M359 Systemic involvement of connective tissue, unspecified: Secondary | ICD-10-CM

## 2022-12-10 DIAGNOSIS — Z79899 Other long term (current) drug therapy: Secondary | ICD-10-CM

## 2022-12-10 DIAGNOSIS — E559 Vitamin D deficiency, unspecified: Secondary | ICD-10-CM | POA: Diagnosis not present

## 2022-12-10 DIAGNOSIS — R5383 Other fatigue: Secondary | ICD-10-CM | POA: Diagnosis not present

## 2022-12-10 DIAGNOSIS — M1711 Unilateral primary osteoarthritis, right knee: Secondary | ICD-10-CM | POA: Diagnosis not present

## 2022-12-10 DIAGNOSIS — Z8669 Personal history of other diseases of the nervous system and sense organs: Secondary | ICD-10-CM

## 2022-12-10 DIAGNOSIS — M170 Bilateral primary osteoarthritis of knee: Secondary | ICD-10-CM

## 2022-12-10 DIAGNOSIS — M62838 Other muscle spasm: Secondary | ICD-10-CM | POA: Diagnosis not present

## 2022-12-10 DIAGNOSIS — M3219 Other organ or system involvement in systemic lupus erythematosus: Secondary | ICD-10-CM

## 2022-12-10 DIAGNOSIS — M7712 Lateral epicondylitis, left elbow: Secondary | ICD-10-CM

## 2022-12-10 DIAGNOSIS — Z96643 Presence of artificial hip joint, bilateral: Secondary | ICD-10-CM | POA: Diagnosis not present

## 2022-12-17 ENCOUNTER — Encounter: Payer: Self-pay | Admitting: Gastroenterology

## 2022-12-21 ENCOUNTER — Other Ambulatory Visit (HOSPITAL_BASED_OUTPATIENT_CLINIC_OR_DEPARTMENT_OTHER): Payer: Self-pay

## 2023-01-05 ENCOUNTER — Other Ambulatory Visit (HOSPITAL_BASED_OUTPATIENT_CLINIC_OR_DEPARTMENT_OTHER): Payer: Self-pay

## 2023-01-05 DIAGNOSIS — M25562 Pain in left knee: Secondary | ICD-10-CM | POA: Diagnosis not present

## 2023-01-05 MED ORDER — TRAMADOL HCL 50 MG PO TABS
50.0000 mg | ORAL_TABLET | Freq: Three times a day (TID) | ORAL | 2 refills | Status: DC
  Filled 2023-01-18: qty 90, 30d supply, fill #0
  Filled 2023-02-23: qty 90, 30d supply, fill #1
  Filled 2023-03-30: qty 90, 30d supply, fill #2

## 2023-01-07 ENCOUNTER — Ambulatory Visit: Payer: Commercial Managed Care - PPO | Admitting: *Deleted

## 2023-01-07 ENCOUNTER — Other Ambulatory Visit (HOSPITAL_BASED_OUTPATIENT_CLINIC_OR_DEPARTMENT_OTHER): Payer: Self-pay

## 2023-01-07 VITALS — Ht 69.54 in | Wt 243.0 lb

## 2023-01-07 DIAGNOSIS — Z1211 Encounter for screening for malignant neoplasm of colon: Secondary | ICD-10-CM

## 2023-01-07 MED ORDER — NA SULFATE-K SULFATE-MG SULF 17.5-3.13-1.6 GM/177ML PO SOLN
1.0000 | Freq: Once | ORAL | 0 refills | Status: AC
  Filled 2023-01-07: qty 354, 1d supply, fill #0

## 2023-01-07 NOTE — Progress Notes (Signed)
Pt's name and DOB verified at the beginning of the pre-visit wit 2 identifiers  Pt denies any difficulty with ambulating,sitting, laying down or rolling side to side  Pt has no issues with ambulation   Pt has no issues moving head neck or swallowing  No egg or soy allergy known to patient   No issues known to pt with past sedation with any surgeries or procedures  Pt denies having issues being intubated  No FH of Malignant Hyperthermia  Pt is not on diet pills or shots  Pt is not on home 02   Pt is not on blood thinners   Pt denies issues with constipation   Pt is not on dialysis  Pt denise any abnormal heart rhythms   Pt denies any upcoming cardiac testing  Pt encouraged to use to use Singlecare or Goodrx to reduce cost   Patient's chart reviewed by Cathlyn Parsons CNRA prior to pre-visit and patient appropriate for the [facility].  Pre-visit completed and red dot placed by patient's name on their procedure day (on provider's schedule).  .  Visit by phone  Pt states weight is 243 lb   Instructed pt why it is important to and  to call if they have any changes in health or new medications. Directed them to the # given and on instructions.     Instructions reviewed. Pt given both [facility] main # and MD on call # prior to instructions.  Pt states understanding. Instructed to review again prior to procedure. Pt states they will.   Instructions sent by mail with coupon and by [employer]  Coupon sent via text to mobile phone and pt verified they received it

## 2023-01-18 ENCOUNTER — Other Ambulatory Visit (HOSPITAL_BASED_OUTPATIENT_CLINIC_OR_DEPARTMENT_OTHER): Payer: Self-pay

## 2023-02-07 ENCOUNTER — Encounter: Payer: Self-pay | Admitting: Certified Registered Nurse Anesthetist

## 2023-02-09 ENCOUNTER — Encounter: Payer: Self-pay | Admitting: Gastroenterology

## 2023-02-11 ENCOUNTER — Ambulatory Visit: Payer: Commercial Managed Care - PPO | Admitting: Gastroenterology

## 2023-02-11 ENCOUNTER — Encounter: Payer: Self-pay | Admitting: Gastroenterology

## 2023-02-11 VITALS — BP 114/73 | HR 62 | Temp 96.4°F | Resp 16 | Ht 69.5 in | Wt 243.0 lb

## 2023-02-11 DIAGNOSIS — K644 Residual hemorrhoidal skin tags: Secondary | ICD-10-CM

## 2023-02-11 DIAGNOSIS — Z1211 Encounter for screening for malignant neoplasm of colon: Secondary | ICD-10-CM | POA: Diagnosis not present

## 2023-02-11 DIAGNOSIS — K648 Other hemorrhoids: Secondary | ICD-10-CM | POA: Diagnosis not present

## 2023-02-11 DIAGNOSIS — K6389 Other specified diseases of intestine: Secondary | ICD-10-CM | POA: Diagnosis not present

## 2023-02-11 MED ORDER — SODIUM CHLORIDE 0.9 % IV SOLN: 500.0000 mL | Freq: Once | INTRAVENOUS | Status: DC

## 2023-02-11 NOTE — Progress Notes (Signed)
Cell phone off per pt Pt's states no medical or surgical changes since previsit or office visit.  

## 2023-02-11 NOTE — Progress Notes (Signed)
[facility] History and Physical   Primary Care Physician:  Deeann Saint, MD   Reason for Procedure:  Colorectal cancer screening  Plan:    Screening colonoscopy with possible interventions as needed     HPI: Julia Monroe is a very pleasant 53 y.o. female here for screening colonoscopy. Denies any nausea, vomiting, abdominal pain, melena or bright red blood per rectum  The risks and benefits as well as alternatives of endoscopic procedure(s) have been discussed and reviewed. All questions answered. The patient agrees to proceed.    Past Medical History:  Diagnosis Date   Arthritis    osteoarthritis   Blood transfusion without reported diagnosis    2013 Hip replacement   Complication of anesthesia    bottom of feet itching   Elevated hemoglobin A1c 2018   6.1   GERD (gastroesophageal reflux disease)    Herpes simplex type II infection 2020   Lupus Pt states its a blood disorder that has no name for it so MD listed as Lupus. Something with DNA 2018   Dr.Deveshwar, currently in normal range    Past Surgical History:  Procedure Laterality Date   ABDOMINAL HYSTERECTOMY     CESAREAN SECTION  2000   CYSTOSCOPY N/A 07/06/2016   Procedure: CYSTOSCOPY;  Surgeon: Patton Salles, MD;  Location: [facility];  Service: Gynecology;  Laterality: N/A;   ingrown toenail Left 07/28/2019   JOINT REPLACEMENT  L   Left hip replacement   LAPAROSCOPIC GASTRIC SLEEVE RESECTION N/A 03/21/2018   Procedure: LAPAROSCOPIC GASTRIC SLEEVE RESECTION, UPPER ENDO, ERAS Pathway;  Surgeon: Gaynelle Adu, MD;  Location: [facility];  Service: General;  Laterality: N/A;   OOPHORECTOMY  2007   Rt.oophorectomy    OVARIAN CYST SURGERY  ? and 10   rt removed,cyst off lft   TOTAL HIP ARTHROPLASTY  07/06/2011   Procedure: TOTAL HIP ARTHROPLASTY;  Surgeon: Nestor Lewandowsky, MD;  Location: [facility];  Service: Orthopedics;  Laterality: Left;  DEPUY/PINNACLE   TOTAL HIP ARTHROPLASTY Right  11/21/2018   Procedure: RIGHT TOTAL HIP ARTHROPLASTY ANTERIOR APPROACH;  Surgeon: Gean Birchwood, MD;  Location: [facility];  Service: Orthopedics;  Laterality: Right;   TOTAL KNEE ARTHROPLASTY Left 07/20/2022   Procedure: LEFT TOTAL KNEE ARTHROPLASTY;  Surgeon: Gean Birchwood, MD;  Location: [facility];  Service: Orthopedics;  Laterality: Left;   TOTAL LAPAROSCOPIC HYSTERECTOMY WITH SALPINGECTOMY Left 07/06/2016   Procedure: HYSTERECTOMY TOTAL LAPAROSCOPIC WITH Left SALPINGECTOMY, Suture Left Side Wall Laceration;  Surgeon: Patton Salles, MD;  Location: [facility];  Service: Gynecology;  Laterality: Left;    Prior to Admission medications   Medication Sig Start Date End Date Taking? Authorizing Provider  Acetaminophen (TYLENOL PO) Take by mouth as needed.   Yes [provider]  acetaminophen-codeine (TYLENOL #3) 300-30 MG tablet Take 1-2 tablet by mouth every six to eight hours as needed for pain 07/24/22  Yes   CASCARA SAGRADA PO Take 1 tablet by mouth daily.   Yes [provider]  Cholecalciferol (VITAMIN D) 125 MCG (5000 UT) CAPS Take 5,000 Units by mouth 3 (three) times a week.   Yes [provider]  diclofenac Sodium (VOLTAREN) 1 % GEL APPLY 2 TO 4 GRAMS TO AFFECTED JOINT UP TO 4 TIMES DAILY Patient taking differently: Apply 2 g topically 4 (four) times daily as needed (pain). 07/25/20  Yes Gearldine Bienenstock, PA-C  fexofenadine (ALLEGRA) 180 MG tablet Take 1 tablet (180 mg total) by mouth  daily. 06/03/22  Yes Deeann Saint, MD  fluticasone (FLONASE) 50 MCG/ACT nasal spray Place 1 spray into both nostrils daily. Patient taking differently: Place 1 spray into both nostrils daily as needed for allergies. 06/03/22  Yes Deeann Saint, MD  Multiple Vitamins-Minerals (BARIATRIC MULTIVITAMINS/IRON PO) Take 1 tablet by mouth 2 (two) times daily.   Yes [provider]  Tetrahydrozoline HCl (VISINE OP) Place 1 drop into both eyes 3 (three) times daily as needed (for  irritated/dry eyes.).   Yes [provider]  traMADol (ULTRAM) 50 MG tablet Take 1 tablet (50 mg total) by mouth 3 (three) times daily as needed. 07/14/22  Yes   aspirin EC 81 MG tablet Take 1 tablet (81 mg total) by mouth 2 (two) times daily. Patient not taking: Reported on 12/10/2022 07/20/22   Allena Katz, PA-C  diclofenac (VOLTAREN) 50 MG EC tablet Take 1 tablet (50 mg total) by mouth as needed for migraine. Patient not taking: Reported on 01/07/2023 12/16/21   Deeann Saint, MD  Menthol, Topical Analgesic, (BIOFREEZE EX) Apply 1 application topically 2 (two) times daily as needed (pain).    [provider]  traMADol (ULTRAM) 50 MG tablet Take 1 tablet (50 mg total) by mouth 3 (three) times daily as needed for pain. Patient not taking: Reported on 01/07/2023 10/22/22     traMADol (ULTRAM) 50 MG tablet Take 1 tablet by mouth three times a day as needed for pain Patient not taking: Reported on 01/07/2023 01/05/23       Current Outpatient Medications  Medication Sig Dispense Refill   Acetaminophen (TYLENOL PO) Take by mouth as needed.     acetaminophen-codeine (TYLENOL #3) 300-30 MG tablet Take 1-2 tablet by mouth every six to eight hours as needed for pain 60 tablet 0   CASCARA SAGRADA PO Take 1 tablet by mouth daily.     Cholecalciferol (VITAMIN D) 125 MCG (5000 UT) CAPS Take 5,000 Units by mouth 3 (three) times a week.     diclofenac Sodium (VOLTAREN) 1 % GEL APPLY 2 TO 4 GRAMS TO AFFECTED JOINT UP TO 4 TIMES DAILY (Patient taking differently: Apply 2 g topically 4 (four) times daily as needed (pain).) 400 g 2   fexofenadine (ALLEGRA) 180 MG tablet Take 1 tablet (180 mg total) by mouth daily. 100 tablet 3   fluticasone (FLONASE) 50 MCG/ACT nasal spray Place 1 spray into both nostrils daily. (Patient taking differently: Place 1 spray into both nostrils daily as needed for allergies.) 16 g 7   Multiple Vitamins-Minerals (BARIATRIC MULTIVITAMINS/IRON PO) Take 1 tablet by  mouth 2 (two) times daily.     Tetrahydrozoline HCl (VISINE OP) Place 1 drop into both eyes 3 (three) times daily as needed (for irritated/dry eyes.).     traMADol (ULTRAM) 50 MG tablet Take 1 tablet (50 mg total) by mouth 3 (three) times daily as needed. 90 tablet 2   aspirin EC 81 MG tablet Take 1 tablet (81 mg total) by mouth 2 (two) times daily. (Patient not taking: Reported on 12/10/2022) 60 tablet 0   diclofenac (VOLTAREN) 50 MG EC tablet Take 1 tablet (50 mg total) by mouth as needed for migraine. (Patient not taking: Reported on 01/07/2023) 20 tablet 0   Menthol, Topical Analgesic, (BIOFREEZE EX) Apply 1 application topically 2 (two) times daily as needed (pain).     traMADol (ULTRAM) 50 MG tablet Take 1 tablet (50 mg total) by mouth 3 (three) times daily as needed for pain. (  Patient not taking: Reported on 01/07/2023) 90 tablet 2   traMADol (ULTRAM) 50 MG tablet Take 1 tablet by mouth three times a day as needed for pain (Patient not taking: Reported on 01/07/2023) 90 tablet 2   Current Facility-Administered Medications  Medication Dose Route Frequency Provider Last Rate Last Admin   0.9 %  sodium chloride infusion  500 mL Intravenous Once Napoleon Form, MD        Allergies as of 02/11/2023 - Review Complete 02/11/2023  Allergen Reaction Noted   Monosodium glutamate Other (See Comments) 06/26/2011   Other  07/10/2022   Aspirin Nausea And Vomiting 01/07/2023   Hydromorphone  12/20/2018   Lactose intolerance (gi) Other (See Comments) 06/17/2016   Levocetirizine Itching 01/29/2022   Caffeine Other (See Comments) 08/12/2015   Hydrocodone Other (See Comments) 08/22/2015   Oxycodone Other (See Comments) 04/29/2016    Family History  Problem Relation Age of Onset   Osteoarthritis Mother    Osteoarthritis Brother    Hypertension Brother    Diabetes Brother    Osteoarthritis Maternal Grandmother    Arthritis Maternal Grandmother    Colon polyps Neg Hx    Colon cancer Neg Hx     Esophageal cancer Neg Hx    Stomach cancer Neg Hx    Rectal cancer Neg Hx     Social History   Socioeconomic History   Marital status: Single    Spouse name: Not on file   Number of children: Not on file   Years of education: Not on file   Highest education level: Not on file  Occupational History   Not on file  Tobacco Use   Smoking status: Never    Passive exposure: Never   Smokeless tobacco: Never  Vaping Use   Vaping status: Never Used  Substance and Sexual Activity   Alcohol use: No    Alcohol/week: 0.0 standard drinks of alcohol   Drug use: No   Sexual activity: Yes    Partners: Male    Birth control/protection: Surgical    Comment: Hyst  Other Topics Concern   Not on file  Social History Narrative   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  Physical Exam: Vital signs in last 24 hours: BP (!) 143/68   Pulse 64   Temp (!) 96.4 F (35.8 C) (Skin)   Ht 5' 9.5" (1.765 m)   Wt 243 lb (110.2 kg)   LMP 04/29/2016   SpO2 100%   BMI 35.37 kg/m  General:   Alert, NAD Lungs:  Clear .   Heart:  Regular rate and rhythm Abdomen:  Soft, nontender and nondistended. Neuro/Psych:  Alert and cooperative. Normal mood and affect. A and O x 3  Reviewed labs, radiology imaging, old records and pertinent past GI work up  Patient is appropriate for planned procedure(s) and anesthesia in an ambulatory setting   K. Tyrone Nine  Lavon Paganini , MD [phone]

## 2023-02-11 NOTE — Patient Instructions (Signed)
-  Handout on hemorrhoids provided -repeat colonoscopy In 10 years for surveillance recommended.  -Continue present medications   YOU HAD AN ENDOSCOPIC PROCEDURE TODAY AT THE [facility]:   Refer to the procedure report that was given to you for any specific questions about what was found during the examination.  If the procedure report does not answer your questions, please call your gastroenterologist to clarify.  If you requested that your care partner not be given the details of your procedure findings, then the procedure report has been included in a sealed envelope for you to review at your convenience later.  YOU SHOULD EXPECT: Some feelings of bloating in the abdomen. Passage of more gas than usual.  Walking can help get rid of the air that was put into your GI tract during the procedure and reduce the bloating. If you had a lower endoscopy (such as a colonoscopy or flexible sigmoidoscopy) you may notice spotting of blood in your stool or on the toilet paper. If you underwent a bowel prep for your procedure, you may not have a normal bowel movement for a few days.  Please Note:  You might notice some irritation and congestion in your nose or some drainage.  This is from the oxygen used during your procedure.  There is no need for concern and it should clear up in a day or so.  SYMPTOMS TO REPORT IMMEDIATELY:  Following lower endoscopy (colonoscopy or flexible sigmoidoscopy):  Excessive amounts of blood in the stool  Significant tenderness or worsening of abdominal pains  Swelling of the abdomen that is new, acute  Fever of 100F or higher   For urgent or emergent issues, a gastroenterologist can be reached at any hour by calling [phone]. Do not use MyChart messaging for urgent concerns.    DIET:  We do recommend a small meal at first, but then you may proceed to your regular diet.  Drink plenty of fluids but you should avoid alcoholic beverages for 24  hours.  ACTIVITY:  You should plan to take it easy for the rest of today and you should NOT DRIVE or use heavy machinery until tomorrow (because of the sedation medicines used during the test).    FOLLOW UP: Our staff will call the number listed on your records the next business day following your procedure.  We will call around 7:15- 8:00 am to check on you and address any questions or concerns that you may have regarding the information given to you following your procedure. If we do not reach you, we will leave a message.     If any biopsies were taken you will be contacted by phone or by letter within the next 1-3 weeks.  Please call us at [phone] if you have not heard about the biopsies in 3 weeks.    SIGNATURES/CONFIDENTIALITY: You and/or your care partner have signed paperwork which will be entered into your electronic medical record.  These signatures attest to the fact that that the information above on your After Visit Summary has been reviewed and is understood.  Full responsibility of the confidentiality of this discharge information lies with you and/or your care-partner.

## 2023-02-11 NOTE — Op Note (Signed)
[facility] Patient Name: Julia Monroe Procedure Date: 02/11/2023 11:42 AM MRN: [id number] Endoscopist: Napoleon Form , MD, [id number] Age: 53 Referring MD:  Date of Birth: 05-29-1970 Gender: Female Account #: [id number] Procedure:                Colonoscopy Indications:              Screening for colorectal malignant neoplasm Medicines:                Monitored Anesthesia Care Procedure:                Pre-Anesthesia Assessment:                           - Prior to the procedure, a History and Physical                            was performed, and patient medications and                            allergies were reviewed. The patient's tolerance of                            previous anesthesia was also reviewed. The risks                            and benefits of the procedure and the sedation                            options and risks were discussed with the patient.                            All questions were answered, and informed consent                            was obtained. Prior Anticoagulants: The patient has                            taken no anticoagulant or antiplatelet agents. ASA                            Grade Assessment: II - A patient with mild systemic                            disease. After reviewing the risks and benefits,                            the patient was deemed in satisfactory condition to                            undergo the procedure.                           After obtaining informed consent, the colonoscope  was passed under direct vision. Throughout the                            procedure, the patient's blood pressure, pulse, and                            oxygen saturations were monitored continuously. The                            PCF-HQ190L Colonoscope [id number] was introduced                            through the anus and advanced to the the cecum,                            identified by  appendiceal orifice and ileocecal                            valve. The colonoscopy was performed without                            difficulty. The patient tolerated the procedure                            well. The quality of the bowel preparation was                            good. The ileocecal valve, appendiceal orifice, and                            rectum were photographed. Scope In: 11:51:09 AM Scope Out: 12:07:00 PM Scope Withdrawal Time: 0 hours 10 minutes 29 seconds  Total Procedure Duration: 0 hours 15 minutes 51 seconds  Findings:                 The perianal and digital rectal examinations were                            normal.                           A diffuse area of moderate melanosis was found in                            the entire colon.                           Non-bleeding external and internal hemorrhoids were                            found during retroflexion. The hemorrhoids were                            small.  The exam was otherwise without abnormality. Complications:            No immediate complications. Estimated Blood Loss:     Estimated blood loss: none. Impression:               - Melanosis in the colon.                           - Non-bleeding external and internal hemorrhoids.                           - The examination was otherwise normal.                           - No specimens collected. Recommendation:           - Patient has a contact number available for                            emergencies. The signs and symptoms of potential                            delayed complications were discussed with the                            patient. Return to normal activities tomorrow.                            Written discharge instructions were provided to the                            patient.                           - Resume previous diet.                           - Continue present medications.                            - Repeat colonoscopy in 10 years for surveillance. Napoleon Form, MD 02/11/2023 12:10:07 PM This report has been signed electronically.

## 2023-02-11 NOTE — Progress Notes (Signed)
Report given to PACU, vss 

## 2023-02-12 ENCOUNTER — Telehealth: Payer: Self-pay | Admitting: *Deleted

## 2023-02-12 NOTE — Telephone Encounter (Signed)
  Follow up Call-     02/11/2023   11:02 AM  Call back number  Post procedure Call Back phone  # [phone]  Permission to leave phone message Yes     Patient questions:  Do you have a fever, pain , or abdominal swelling? No. Pain Score  0 *  Have you tolerated food without any problems? Yes.    Have you been able to return to your normal activities? Yes.    Do you have any questions about your discharge instructions: Diet   No. Medications  No. Follow up visit  No.  Do you have questions or concerns about your Care? No.  Actions: * If pain score is 4 or above: No action needed, pain <4.

## 2023-02-23 ENCOUNTER — Other Ambulatory Visit (HOSPITAL_BASED_OUTPATIENT_CLINIC_OR_DEPARTMENT_OTHER): Payer: Self-pay

## 2023-03-18 ENCOUNTER — Other Ambulatory Visit: Payer: Self-pay | Admitting: Physician Assistant

## 2023-03-18 ENCOUNTER — Other Ambulatory Visit (HOSPITAL_BASED_OUTPATIENT_CLINIC_OR_DEPARTMENT_OTHER): Payer: Self-pay

## 2023-03-18 MED ORDER — DICLOFENAC SODIUM 1 % EX GEL
CUTANEOUS | 2 refills | Status: DC
  Filled 2023-03-18: qty 400, 30d supply, fill #0

## 2023-03-18 NOTE — Telephone Encounter (Signed)
Last Fill: 07/25/2020  Next Visit: 06/09/2023  Last Visit: 12/10/2022  Dx: Primary osteoarthritis of right knee   Current Dose per office note on 12/10/2022: not discussed  Okay to refill Voltaren Gel?

## 2023-03-23 ENCOUNTER — Other Ambulatory Visit (HOSPITAL_BASED_OUTPATIENT_CLINIC_OR_DEPARTMENT_OTHER): Payer: Self-pay

## 2023-03-29 ENCOUNTER — Other Ambulatory Visit (HOSPITAL_BASED_OUTPATIENT_CLINIC_OR_DEPARTMENT_OTHER): Payer: Self-pay

## 2023-03-30 ENCOUNTER — Other Ambulatory Visit (HOSPITAL_BASED_OUTPATIENT_CLINIC_OR_DEPARTMENT_OTHER): Payer: Self-pay

## 2023-04-23 ENCOUNTER — Ambulatory Visit: Payer: Self-pay | Admitting: *Deleted

## 2023-04-23 NOTE — Telephone Encounter (Signed)
  Chief Complaint: cough , ear pain Symptoms: coughing up yellow sputum, drainage back of throat. Bilateral ear pain. Blood tinged sputum x 1 reported. Has been taking OTC allergy medication and just started mucinex.  Frequency: Tuesday  Pertinent Negatives: Patient denies chest pain no difficulty breathing  Disposition: [] ED /[] Urgent Care (no appt availability in office) / [x] Appointment(In office/virtual)/ []  [facility]/ [] Home Care/ [] Refused Recommended Disposition /[] [facility]/ []  Follow-up with PCP Additional Notes:   Scheduled earliest available with PCP. Please advise any recommendations for treatment until patient comes to [facility] 04/26/23.       Copied from CRM [id number]. Topic: Clinical - Red Word Triage >> Apr 23, 2023  8:03 AM Desma Mcgregor wrote: Red Word that prompted transfer to Nurse Triage: Patient expressed she is coughing up yellow mucous with a ting of blood in it, has ear pain, and wants to make sure she does not have an infection. She is taking mucinex and otc allergy meds and they helped. Reason for Disposition  Earache  Answer Assessment - Initial Assessment Questions 1. ONSET: "When did the cough begin?"      Tuesday  2. SEVERITY: "How bad is the cough today?"      Not bad 3. SPUTUM: "Describe the color of your sputum" (none, dry cough; clear, white, yellow, green)     Yellow  4. HEMOPTYSIS: "Are you coughing up any blood?" If so ask: "How much?" (flecks, streaks, tablespoons, etc.)     Blood tinged 5. DIFFICULTY BREATHING: "Are you having difficulty breathing?" If Yes, ask: "How bad is it?" (e.g., mild, moderate, severe)    - MILD: No SOB at rest, mild SOB with walking, speaks normally in sentences, can lie down, no retractions, pulse < 100.    - MODERATE: SOB at rest, SOB with minimal exertion and prefers to sit, cannot lie down flat, speaks in phrases, mild retractions, audible wheezing, pulse 100-120.    - SEVERE: Very SOB at rest, speaks in  single words, struggling to breathe, sitting hunched forward, retractions, pulse > 120      Denies  6. FEVER: "Do you have a fever?" If Yes, ask: "What is your temperature, how was it measured, and when did it start?"     no 7. CARDIAC HISTORY: "Do you have any history of heart disease?" (e.g., heart attack, congestive heart failure)      na 8. LUNG HISTORY: "Do you have any history of lung disease?"  (e.g., pulmonary embolus, asthma, emphysema)     na 9. PE RISK FACTORS: "Do you have a history of blood clots?" (or: recent major surgery, recent prolonged travel, bedridden)     na 10. OTHER SYMPTOMS: "Do you have any other symptoms?" (e.g., runny nose, wheezing, chest pain)       Drainage back of throat , coughing up yellow ,left ear pain and now right ear pain  11. PREGNANCY: "Is there any chance you are pregnant?" "When was your last menstrual period?"       na 12. TRAVEL: "Have you traveled out of the country in the last month?" (e.g., travel history, exposures)       na  Protocols used: Cough - Acute Productive-A-AH

## 2023-04-26 ENCOUNTER — Encounter: Payer: Self-pay | Admitting: Family Medicine

## 2023-04-26 ENCOUNTER — Ambulatory Visit: Admitting: Family Medicine

## 2023-04-26 VITALS — BP 118/74 | HR 73 | Temp 98.3°F | Ht 69.5 in | Wt 251.6 lb

## 2023-04-26 DIAGNOSIS — J301 Allergic rhinitis due to pollen: Secondary | ICD-10-CM

## 2023-04-26 NOTE — Progress Notes (Signed)
 Established Patient Office Visit   Subjective  Patient ID: Julia Monroe, female    DOB: 12-Oct-1970  Age: 53 y.o. MRN: [id number]  Chief Complaint  Patient presents with   Cough    Patient came in today for cough and congestion started 5 days ago, patient is starting to feel better, TX: Mucus relief DM, Dextromethorphan, cetirizine, Flonase     Patient is a 53 year old female seen for acute concerns.  Patient endorses to sharp pains in the ears then developing nasal drainage 2 days later.  Symptoms started after returning from a trip to [place].  Endorses intermittent productive cough due to the drainage.  Started taking dextromethorphan, cetirizine, Mucus Relief DM with guanfacine and dextromethorphan as well as Flonase.  Starting to feel better.  Was taking Allegra but switched allergy medicines to the advice of pharmacy.  Patient states she is unable to take the Mucus Relief DM twice a day as it dries her out, causes headache, and urinary frequency.  Pt unable to stay on top of hydration due to history of gastric surgery.  Patient notes her boyfriend has also been sick with similar symptoms.    Patient Active Problem List   Diagnosis Date Noted   Degenerative arthritis of left knee 07/10/2022   H/O gastric sleeve 02/12/2022   History of total hip arthroplasty, right 11/21/2018   Prediabetes 03/21/2018   SLE (systemic lupus erythematosus) (HCC) 03/21/2018   Obesity 03/21/2018   History of migraine 08/27/2017   Morbid obesity (HCC) 01/27/2017   Status post bilateral total hip replacement 10/27/2016   Hyperglycemia 08/25/2016   Leukocytosis 08/13/2016   Status post laparoscopic hysterectomy 07/06/2016   Primary osteoarthritis of both knees 04/29/2016   ANA positive 04/29/2016   Primary osteoarthritis of right hip 07/10/2011   Past Medical History:  Diagnosis Date   Arthritis    osteoarthritis   Blood transfusion without reported diagnosis    2013 Hip replacement    Complication of anesthesia    bottom of feet itching   Elevated hemoglobin A1c 2018   6.1   GERD (gastroesophageal reflux disease)    Herpes simplex type II infection 2020   Lupus Pt states its a blood disorder that has no name for it so MD listed as Lupus. Something with DNA 2018   Dr.Deveshwar, currently in normal range   Past Surgical History:  Procedure Laterality Date   ABDOMINAL HYSTERECTOMY     CESAREAN SECTION  2000   CYSTOSCOPY N/A 07/06/2016   Procedure: CYSTOSCOPY;  Surgeon: Patton Salles, MD;  Location: [facility];  Service: Gynecology;  Laterality: N/A;   ingrown toenail Left 07/28/2019   JOINT REPLACEMENT  L   Left hip replacement   LAPAROSCOPIC GASTRIC SLEEVE RESECTION N/A 03/21/2018   Procedure: LAPAROSCOPIC GASTRIC SLEEVE RESECTION, UPPER ENDO, ERAS Pathway;  Surgeon: Gaynelle Adu, MD;  Location: [facility];  Service: General;  Laterality: N/A;   OOPHORECTOMY  2007   Rt.oophorectomy    OVARIAN CYST SURGERY  ? and 10   rt removed,cyst off lft   TOTAL HIP ARTHROPLASTY  07/06/2011   Procedure: TOTAL HIP ARTHROPLASTY;  Surgeon: Nestor Lewandowsky, MD;  Location: [facility];  Service: Orthopedics;  Laterality: Left;  DEPUY/PINNACLE   TOTAL HIP ARTHROPLASTY Right 11/21/2018   Procedure: RIGHT TOTAL HIP ARTHROPLASTY ANTERIOR APPROACH;  Surgeon: Gean Birchwood, MD;  Location: [facility];  Service: Orthopedics;  Laterality: Right;   TOTAL KNEE ARTHROPLASTY Left 07/20/2022   Procedure: LEFT TOTAL KNEE  ARTHROPLASTY;  Surgeon: Gean Birchwood, MD;  Location: [facility];  Service: Orthopedics;  Laterality: Left;   TOTAL LAPAROSCOPIC HYSTERECTOMY WITH SALPINGECTOMY Left 07/06/2016   Procedure: HYSTERECTOMY TOTAL LAPAROSCOPIC WITH Left SALPINGECTOMY, Suture Left Side Wall Laceration;  Surgeon: Patton Salles, MD;  Location: [facility];  Service: Gynecology;  Laterality: Left;   Social History   Tobacco Use   Smoking status: Never    Passive exposure: Never   Smokeless tobacco: Never  Vaping  Use   Vaping status: Never Used  Substance Use Topics   Alcohol use: No    Alcohol/week: 0.0 standard drinks of alcohol   Drug use: No   Family History  Problem Relation Age of Onset   Osteoarthritis Mother    Osteoarthritis Brother    Hypertension Brother    Diabetes Brother    Osteoarthritis Maternal Grandmother    Arthritis Maternal Grandmother    Colon polyps Neg Hx    Colon cancer Neg Hx    Esophageal cancer Neg Hx    Stomach cancer Neg Hx    Rectal cancer Neg Hx    Allergies  Allergen Reactions   Monosodium Glutamate Other (See Comments)    Loses vision, headache/migraine   Other      Blood Product Refusal    Aspirin Nausea And Vomiting   Hydromorphone     Makes patient "Loopy" and dreams terrible things   Lactose Intolerance (Gi) Other (See Comments)    Gi upset   Levocetirizine Itching   Caffeine Other (See Comments)    Severe headache   Hydrocodone Other (See Comments)    Headaches/migraines    Oxycodone Other (See Comments)    Headache/Migraine      ROS Negative unless stated above    Objective:     BP 118/74 (BP Location: Left Arm, Patient Position: Sitting, Cuff Size: Large)   Pulse 73   Temp 98.3 F (36.8 C) (Oral)   Ht 5' 9.5" (1.765 m)   Wt 251 lb 9.6 oz (114.1 kg)   LMP 04/29/2016   SpO2 97%   BMI 36.62 kg/m  BP Readings from Last 3 Encounters:  04/26/23 118/74  02/11/23 (P) 120/80  12/10/22 125/84   Wt Readings from Last 3 Encounters:  04/26/23 251 lb 9.6 oz (114.1 kg)  02/11/23 243 lb (110.2 kg)  01/07/23 243 lb (110.2 kg)      Physical Exam Constitutional:      General: She is not in acute distress.    Appearance: Normal appearance.  HENT:     Head: Normocephalic and atraumatic.     Nose: Nose normal.     Mouth/Throat:     Mouth: Mucous membranes are moist.  Cardiovascular:     Rate and Rhythm: Normal rate and regular rhythm.     Heart sounds: Normal heart sounds. No murmur heard.    No gallop.  Pulmonary:      Effort: Pulmonary effort is normal. No respiratory distress.     Breath sounds: Normal breath sounds. No wheezing, rhonchi or rales.  Skin:    General: Skin is warm and dry.  Neurological:     Mental Status: She is alert and oriented to person, place, and time.      No results found for any visits on 04/26/23.    Assessment & Plan:  Seasonal allergic rhinitis due to pollen  Patient with acute nasal congestion, cough and now resolved ear pain likely 2/2 allergic rhinitis given travel and increased pollen  in the area.  Advised to continue antihistamine, saline nasal rinse, etc.  Patient to notify clinic for worsening symptoms.  Continue to monitor.  Return if symptoms worsen or fail to improve.   Deeann Saint, MD

## 2023-04-26 NOTE — Telephone Encounter (Signed)
 Patient has an appt 3/31 with Dr. Salomon Fick

## 2023-05-06 ENCOUNTER — Other Ambulatory Visit (HOSPITAL_BASED_OUTPATIENT_CLINIC_OR_DEPARTMENT_OTHER): Payer: Self-pay

## 2023-05-06 MED ORDER — TRAMADOL HCL 50 MG PO TABS
50.0000 mg | ORAL_TABLET | Freq: Three times a day (TID) | ORAL | 2 refills | Status: DC | PRN
Start: 2023-05-06 — End: 2023-08-16
  Filled 2023-05-06: qty 90, 30d supply, fill #0
  Filled 2023-06-10: qty 90, 30d supply, fill #1
  Filled 2023-07-16: qty 90, 30d supply, fill #2

## 2023-05-21 ENCOUNTER — Other Ambulatory Visit (HOSPITAL_BASED_OUTPATIENT_CLINIC_OR_DEPARTMENT_OTHER): Payer: Self-pay

## 2023-05-21 DIAGNOSIS — Z9884 Bariatric surgery status: Secondary | ICD-10-CM | POA: Diagnosis not present

## 2023-05-21 MED ORDER — PHENTERMINE HCL 37.5 MG PO CAPS
37.5000 mg | ORAL_CAPSULE | Freq: Every day | ORAL | 0 refills | Status: DC
  Filled 2023-05-21: qty 30, 30d supply, fill #0

## 2023-06-03 NOTE — Progress Notes (Signed)
 Office Visit Note  Patient: Julia Monroe             Date of Birth: 1970-09-28           MRN: [id number]             PCP: Viola Greulich, MD Referring: Viola Greulich, MD Visit Date: 06/09/2023 Occupation: @GUAROCC @  Subjective:  Joint stiffness  History of Present Illness: Gini Ellingson is a 53 y.o. 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  She returns for a follow-up visit today.  Patient denies any history of oral ulcers, nasal ulcers, malar rash, photosensitivity, Raynaud's, lymphadenopathy or inflammatory arthritis.  She states she continues to have some stiffness in her joints.  With her bilateral hip replacement and left knee replacements are doing well.  She is occasional stiffness in her joints especially her right knee joint.    Activities of Daily Living:  Patient reports morning stiffness for a few steps.  Patient Denies nocturnal pain.  Difficulty dressing/grooming: Denies Difficulty climbing stairs: Denies Difficulty getting out of chair: Denies Difficulty using hands for taps, buttons, cutlery, and/or writing: Denies  Review of Systems  Constitutional:  Negative for fatigue.  HENT:  Negative for mouth sores and mouth dryness.   Eyes:  Negative for dryness.  Respiratory:  Negative for shortness of breath.   Cardiovascular:  Negative for chest pain and palpitations.  Gastrointestinal:  Negative for blood in stool, constipation and diarrhea.  Endocrine: Negative for increased urination.  Genitourinary:  Negative for involuntary urination.  Musculoskeletal:  Positive for morning stiffness. Negative for joint pain, gait problem, joint pain, joint swelling, myalgias, muscle weakness, muscle tenderness and myalgias.  Skin:  Negative for color change, rash, hair loss and sensitivity to sunlight.  Allergic/Immunologic: Negative for susceptible to infections.  Neurological:  Negative for dizziness and headaches.   Hematological:  Negative for swollen glands.  Psychiatric/Behavioral:  Negative for depressed mood and sleep disturbance. The patient is not nervous/anxious.     PMFS History:  Patient Active Problem List   Diagnosis Date Noted   Degenerative arthritis of left knee 07/10/2022   H/O gastric sleeve 02/12/2022   History of total hip arthroplasty, right 11/21/2018   Prediabetes 03/21/2018   SLE (systemic lupus erythematosus) (HCC) 03/21/2018   Obesity 03/21/2018   History of migraine 08/27/2017   Morbid obesity (HCC) 01/27/2017   Status post bilateral total hip replacement 10/27/2016   Hyperglycemia 08/25/2016   Leukocytosis 08/13/2016   Status post laparoscopic hysterectomy 07/06/2016   Primary osteoarthritis of both knees 04/29/2016   ANA positive 04/29/2016   Primary osteoarthritis of right hip 07/10/2011    Past Medical History:  Diagnosis Date   Arthritis    osteoarthritis   Blood transfusion without reported diagnosis    2013 Hip replacement   Complication of anesthesia    bottom of feet itching   Elevated hemoglobin A1c 2018   6.1   GERD (gastroesophageal reflux disease)    Herpes simplex type II infection 2020   Lupus Pt states its a blood disorder that has no name for it so MD listed as Lupus. Something with DNA 2018   Dr.Jazzmyn Filion, currently in normal range    Family History  Problem Relation Age of Onset   Osteoarthritis Mother    Osteoarthritis Brother    Hypertension Brother    Diabetes Brother    Osteoarthritis Maternal Grandmother    Arthritis Maternal Grandmother    Colon polyps Neg Hx  Colon cancer Neg Hx    Esophageal cancer Neg Hx    Stomach cancer Neg Hx    Rectal cancer Neg Hx    Past Surgical History:  Procedure Laterality Date   ABDOMINAL HYSTERECTOMY     CESAREAN SECTION  2000   CYSTOSCOPY N/A 07/06/2016   Procedure: CYSTOSCOPY;  Surgeon: Greta Leatherwood, MD;  Location: [facility];  Service: Gynecology;  Laterality: N/A;   ingrown  toenail Left 07/28/2019   JOINT REPLACEMENT  L   Left hip replacement   LAPAROSCOPIC GASTRIC SLEEVE RESECTION N/A 03/21/2018   Procedure: LAPAROSCOPIC GASTRIC SLEEVE RESECTION, UPPER ENDO, ERAS Pathway;  Surgeon: Aldean Hummingbird, MD;  Location: [facility];  Service: General;  Laterality: N/A;   OOPHORECTOMY  2007   Rt.oophorectomy    OVARIAN CYST SURGERY  ? and 10   rt removed,cyst off lft   TOTAL HIP ARTHROPLASTY  07/06/2011   Procedure: TOTAL HIP ARTHROPLASTY;  Surgeon: Ilean Mall, MD;  Location: [facility];  Service: Orthopedics;  Laterality: Left;  DEPUY/PINNACLE   TOTAL HIP ARTHROPLASTY Right 11/21/2018   Procedure: RIGHT TOTAL HIP ARTHROPLASTY ANTERIOR APPROACH;  Surgeon: Wendolyn Hamburger, MD;  Location: [facility];  Service: Orthopedics;  Laterality: Right;   TOTAL KNEE ARTHROPLASTY Left 07/20/2022   Procedure: LEFT TOTAL KNEE ARTHROPLASTY;  Surgeon: Wendolyn Hamburger, MD;  Location: [facility];  Service: Orthopedics;  Laterality: Left;   TOTAL LAPAROSCOPIC HYSTERECTOMY WITH SALPINGECTOMY Left 07/06/2016   Procedure: HYSTERECTOMY TOTAL LAPAROSCOPIC WITH Left SALPINGECTOMY, Suture Left Side Wall Laceration;  Surgeon: Greta Leatherwood, MD;  Location: [facility];  Service: Gynecology;  Laterality: Left;   Social History   Social History Narrative   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[employer]      Home Situation: 77 yo daughter in 2018      Spiritual Beliefs:      Lifestyle: starting healthy low sugar diet 08/2016/considering exercising at the [employer] History  Administered Date(s) Administered   Influenza, Seasonal, Injecte, Preservative Fre 10/26/2022   Influenza-Unspecified 10/26/2013, 10/26/2016   PFIZER Comirnaty(Gray Top)Covid-19 Tri-Sucrose Vaccine 08/02/2020   PFIZER(Purple Top)SARS-COV-2 Vaccination 04/28/2019, 05/23/2019, 01/12/2020   Pneumococcal Conjugate-13 11/02/2016   Tdap 01/26/2009, 12/20/2018     Objective: Vital Signs: BP 108/75 (BP Location: Left Arm, Patient Position: Sitting,  Cuff Size: Large)   Pulse 73   Resp 17   Ht 5\' 9"  (1.753 m)   Wt 246 lb 9.6 oz (111.9 kg)   LMP 04/29/2016   BMI 36.42 kg/m    Physical Exam Vitals and nursing note reviewed.  Constitutional:      Appearance: She is well-developed.  HENT:     Head: Normocephalic and atraumatic.  Eyes:     Conjunctiva/sclera: Conjunctivae normal.  Cardiovascular:     Rate and Rhythm: Normal rate and regular rhythm.     Heart sounds: Normal heart sounds.  Pulmonary:     Effort: Pulmonary effort is normal.     Breath sounds: Normal breath sounds.  Abdominal:     General: Bowel sounds are normal.     Palpations: Abdomen is soft.  Musculoskeletal:     Cervical back: Normal range of motion.  Lymphadenopathy:     Cervical: No cervical adenopathy.  Skin:    General: Skin is warm and dry.     Capillary Refill: Capillary refill takes less than 2 seconds.  Neurological:     Mental Status: She is alert and oriented to person, place, and time.  Psychiatric:        Behavior: Behavior normal.      Musculoskeletal Exam: Cervical, thoracic and lumbar spine with good range of motion.  Shoulders, elbows, wrist joints, MCPs PIPs and DIPs with good range of motion with no synovitis.  Her both hip joints were replaced and were in good range of motion.  Left knee joint was replaced and was in good range of motion.  Right knee joint was in good range of motion without any warmth swelling or effusion.  There was no tenderness over ankles or MTPs.  CDAI Exam: CDAI Score: -- Patient Global: --; Provider Global: -- Swollen: --; Tender: -- Joint Exam 06/09/2023   No joint exam has been documented for this visit   There is currently no information documented on the homunculus. Go to the Rheumatology activity and complete the homunculus joint exam.  Investigation: No additional findings.  Imaging: No results found.  Recent Labs: Lab Results  Component Value Date   WBC 6.8 12/02/2022   HGB 13.4  12/02/2022   PLT 250 12/02/2022   NA 139 12/02/2022   K 4.1 12/02/2022   CL 104 12/02/2022   CO2 29 12/02/2022   GLUCOSE 70 12/02/2022   BUN 12 12/02/2022   CREATININE 0.53 12/02/2022   BILITOT 0.5 12/02/2022   ALKPHOS 49 05/11/2018   AST 16 12/02/2022   ALT 10 12/02/2022   PROT 7.1 12/02/2022   ALBUMIN  4.1 05/11/2018   CALCIUM 9.2 12/02/2022   GFRAA 101 07/05/2020    Speciality Comments: PLQ eye exam: 11/29/2020 [facility]. Follow up in 1 year.    Procedures:  No procedures performed Allergies: Monosodium glutamate, Other, Aspirin , Hydromorphone , Lactose intolerance (gi), Levocetirizine, Caffeine, Hydrocodone , and Oxycodone    Assessment / Plan:     Visit Diagnoses: Undifferentiated connective tissue disease (HCC) - History of positive ANA, DS DNA, elevated ESR, arthralgias, rash: -Patient has not experienced any symptoms in a while.  She has been off Plaquenil  since November 2022.  She denies any history of oral ulcers, nasal ulcers, malar rash, photosensitivity, Raynaud's, inflammatory arthritis or lymphadenopathy.  No synovitis was noted on the examination today.  Labs obtained in November 2024 complements were normal and sed rate was normal.  Double-stranded DNA remains positive and stable titer.  I will recheck labs today.  Plan: Protein / creatinine ratio, urine, CBC with Differential/Platelet, Comprehensive metabolic panel with GFR, ANA, Anti-DNA antibody, double-stranded, C3 and C4, Sedimentation rate  She discontinued Plaquenil  about 2 years ago.  She is not currently taking any immunosuppressive agents.  I advised her to contact me if she develops any new symptoms.  Lateral epicondylitis, left elbow -she reports intermittent symptoms.  She has not had any episodes in the last few months.  Status post bilateral total hip replacement-she had good range of motion without any discomfort.  Status post total knee replacement, left - July 20, 2022 by Dr. Carry Clapper.   She had good range of motion without any discomfort.  Primary osteoarthritis of right knee-she has intermittent discomfort in her right knee joint.  No warmth swelling or effusion was noted.  Trapezius muscle spasm-she gets trapezius spasm from working.  She has been going for massage therapy which has been helpful.  Vitamin D  deficiency -history of vitamin D  deficiency.  She has been taking vitamin D  intermittently.  Will check vitamin D  level today.  Plan: VITAMIN D  25 Hydroxy (Vit-D Deficiency, Fractures)  History of migraine  Orders: Orders Placed This Encounter  Procedures   Protein / creatinine ratio, urine   CBC with Differential/Platelet   Comprehensive metabolic panel with GFR   ANA   Anti-DNA antibody, double-stranded   C3 and C4   Sedimentation rate   VITAMIN D  25 Hydroxy (Vit-D Deficiency, Fractures)   No orders of the defined types were placed in this encounter.   Follow-Up Instructions: Return in about 6 months (around 12/10/2023) for UCTD.   Nicholas Bari, MD  Note - This record has been created using Animal nutritionist.  Chart creation errors have been sought, but may not always  have been located. Such creation errors do not reflect on  the standard of medical care.

## 2023-06-09 ENCOUNTER — Ambulatory Visit: Payer: Commercial Managed Care - PPO | Attending: Rheumatology | Admitting: Rheumatology

## 2023-06-09 ENCOUNTER — Encounter: Payer: Self-pay | Admitting: Rheumatology

## 2023-06-09 VITALS — BP 108/75 | HR 73 | Resp 17 | Ht 69.0 in | Wt 246.6 lb

## 2023-06-09 DIAGNOSIS — Z96652 Presence of left artificial knee joint: Secondary | ICD-10-CM | POA: Diagnosis not present

## 2023-06-09 DIAGNOSIS — M3219 Other organ or system involvement in systemic lupus erythematosus: Secondary | ICD-10-CM

## 2023-06-09 DIAGNOSIS — M1711 Unilateral primary osteoarthritis, right knee: Secondary | ICD-10-CM | POA: Diagnosis not present

## 2023-06-09 DIAGNOSIS — Z8669 Personal history of other diseases of the nervous system and sense organs: Secondary | ICD-10-CM | POA: Diagnosis not present

## 2023-06-09 DIAGNOSIS — M359 Systemic involvement of connective tissue, unspecified: Secondary | ICD-10-CM

## 2023-06-09 DIAGNOSIS — Z96643 Presence of artificial hip joint, bilateral: Secondary | ICD-10-CM | POA: Diagnosis not present

## 2023-06-09 DIAGNOSIS — R5383 Other fatigue: Secondary | ICD-10-CM

## 2023-06-09 DIAGNOSIS — E559 Vitamin D deficiency, unspecified: Secondary | ICD-10-CM

## 2023-06-09 DIAGNOSIS — M7712 Lateral epicondylitis, left elbow: Secondary | ICD-10-CM

## 2023-06-09 DIAGNOSIS — Z79899 Other long term (current) drug therapy: Secondary | ICD-10-CM

## 2023-06-09 DIAGNOSIS — M62838 Other muscle spasm: Secondary | ICD-10-CM

## 2023-06-10 ENCOUNTER — Other Ambulatory Visit (HOSPITAL_BASED_OUTPATIENT_CLINIC_OR_DEPARTMENT_OTHER): Payer: Self-pay

## 2023-06-10 MED ORDER — PHENTERMINE HCL 37.5 MG PO CAPS
37.5000 mg | ORAL_CAPSULE | Freq: Every morning | ORAL | 0 refills | Status: DC
  Filled 2023-06-10 – 2023-06-24 (×3): qty 30, 30d supply, fill #0

## 2023-06-11 ENCOUNTER — Other Ambulatory Visit (HOSPITAL_BASED_OUTPATIENT_CLINIC_OR_DEPARTMENT_OTHER): Payer: Self-pay

## 2023-06-12 LAB — CBC WITH DIFFERENTIAL/PLATELET
Absolute Lymphocytes: 1848 {cells}/uL (ref 850–3900)
Absolute Monocytes: 521 {cells}/uL (ref 200–950)
Basophils Absolute: 33 {cells}/uL (ref 0–200)
Basophils Relative: 0.5 %
Eosinophils Absolute: 40 {cells}/uL (ref 15–500)
Eosinophils Relative: 0.6 %
HCT: 40.1 % (ref 35.0–45.0)
Hemoglobin: 13 g/dL (ref 11.7–15.5)
MCH: 28.8 pg (ref 27.0–33.0)
MCHC: 32.4 g/dL (ref 32.0–36.0)
MCV: 88.7 fL (ref 80.0–100.0)
MPV: 10.6 fL (ref 7.5–12.5)
Monocytes Relative: 7.9 %
Neutro Abs: 4158 {cells}/uL (ref 1500–7800)
Neutrophils Relative %: 63 %
Platelets: 246 10*3/uL (ref 140–400)
RBC: 4.52 10*6/uL (ref 3.80–5.10)
RDW: 12.9 % (ref 11.0–15.0)
Total Lymphocyte: 28 %
WBC: 6.6 10*3/uL (ref 3.8–10.8)

## 2023-06-12 LAB — COMPREHENSIVE METABOLIC PANEL WITH GFR
AG Ratio: 1.7 (calc) (ref 1.0–2.5)
ALT: 10 U/L (ref 6–29)
AST: 16 U/L (ref 10–35)
Albumin: 4.3 g/dL (ref 3.6–5.1)
Alkaline phosphatase (APISO): 45 U/L (ref 37–153)
BUN: 14 mg/dL (ref 7–25)
CO2: 28 mmol/L (ref 20–32)
Calcium: 9.7 mg/dL (ref 8.6–10.4)
Chloride: 105 mmol/L (ref 98–110)
Creat: 0.61 mg/dL (ref 0.50–1.03)
Globulin: 2.6 g/dL (ref 1.9–3.7)
Glucose, Bld: 81 mg/dL (ref 65–99)
Potassium: 4.4 mmol/L (ref 3.5–5.3)
Sodium: 139 mmol/L (ref 135–146)
Total Bilirubin: 0.4 mg/dL (ref 0.2–1.2)
Total Protein: 6.9 g/dL (ref 6.1–8.1)
eGFR: 108 mL/min/{1.73_m2} (ref >=60)

## 2023-06-12 LAB — C3 AND C4
C3 Complement: 115 mg/dL (ref 83–193)
C4 Complement: 35 mg/dL (ref 15–57)

## 2023-06-12 LAB — PROTEIN / CREATININE RATIO, URINE
Creatinine, Urine: 74 mg/dL (ref 20–275)
Total Protein, Urine: <4 mg/dL — ABNORMAL LOW (ref 5–24)

## 2023-06-12 LAB — SEDIMENTATION RATE: Sed Rate: 17 mm/h (ref 0–30)

## 2023-06-12 LAB — ANA: Anti Nuclear Antibody (ANA): NEGATIVE

## 2023-06-12 LAB — ANTI-DNA ANTIBODY, DOUBLE-STRANDED: ds DNA Ab: 15 [IU]/mL — ABNORMAL HIGH

## 2023-06-12 LAB — VITAMIN D 25 HYDROXY (VIT D DEFICIENCY, FRACTURES): Vit D, 25-Hydroxy: 45 ng/mL (ref 30–100)

## 2023-06-13 ENCOUNTER — Ambulatory Visit: Payer: Self-pay | Admitting: Rheumatology

## 2023-06-13 NOTE — Progress Notes (Signed)
 Double-stranded DNA remains positive, ANA negative, complements normal, sed rate normal, vitamin D  normal, urine protein creatinine ratio normal, CBC and CMP normal.  No change in treatment advised.

## 2023-06-17 ENCOUNTER — Other Ambulatory Visit (HOSPITAL_BASED_OUTPATIENT_CLINIC_OR_DEPARTMENT_OTHER): Payer: Self-pay

## 2023-06-24 ENCOUNTER — Other Ambulatory Visit (HOSPITAL_BASED_OUTPATIENT_CLINIC_OR_DEPARTMENT_OTHER): Payer: Self-pay

## 2023-07-07 ENCOUNTER — Other Ambulatory Visit (HOSPITAL_BASED_OUTPATIENT_CLINIC_OR_DEPARTMENT_OTHER): Payer: Self-pay

## 2023-07-16 ENCOUNTER — Other Ambulatory Visit (HOSPITAL_BASED_OUTPATIENT_CLINIC_OR_DEPARTMENT_OTHER): Payer: Self-pay

## 2023-07-27 ENCOUNTER — Other Ambulatory Visit (HOSPITAL_BASED_OUTPATIENT_CLINIC_OR_DEPARTMENT_OTHER): Payer: Self-pay

## 2023-07-27 MED ORDER — PHENTERMINE HCL 37.5 MG PO CAPS
37.5000 mg | ORAL_CAPSULE | Freq: Every day | ORAL | 0 refills | Status: DC
  Filled 2023-07-27: qty 30, 30d supply, fill #0

## 2023-07-28 ENCOUNTER — Other Ambulatory Visit (HOSPITAL_BASED_OUTPATIENT_CLINIC_OR_DEPARTMENT_OTHER): Payer: Self-pay

## 2023-08-16 ENCOUNTER — Other Ambulatory Visit (HOSPITAL_BASED_OUTPATIENT_CLINIC_OR_DEPARTMENT_OTHER): Payer: Self-pay

## 2023-08-16 MED ORDER — TRAMADOL HCL 50 MG PO TABS
50.0000 mg | ORAL_TABLET | Freq: Three times a day (TID) | ORAL | 2 refills | Status: AC | PRN
  Filled 2023-08-16: qty 90, 30d supply, fill #0
  Filled 2023-10-05: qty 90, 30d supply, fill #1
  Filled 2023-11-18: qty 90, 30d supply, fill #2

## 2023-09-14 ENCOUNTER — Other Ambulatory Visit (HOSPITAL_BASED_OUTPATIENT_CLINIC_OR_DEPARTMENT_OTHER): Payer: Self-pay

## 2023-09-14 MED ORDER — PHENTERMINE HCL 37.5 MG PO TABS
37.5000 mg | ORAL_TABLET | Freq: Every morning | ORAL | 0 refills | Status: DC
  Filled 2023-09-14: qty 30, 30d supply, fill #0

## 2023-09-15 ENCOUNTER — Other Ambulatory Visit (HOSPITAL_BASED_OUTPATIENT_CLINIC_OR_DEPARTMENT_OTHER): Payer: Self-pay

## 2023-10-05 ENCOUNTER — Other Ambulatory Visit (HOSPITAL_BASED_OUTPATIENT_CLINIC_OR_DEPARTMENT_OTHER): Payer: Self-pay

## 2023-10-07 ENCOUNTER — Other Ambulatory Visit (HOSPITAL_BASED_OUTPATIENT_CLINIC_OR_DEPARTMENT_OTHER): Payer: Self-pay

## 2023-10-27 ENCOUNTER — Ambulatory Visit (INDEPENDENT_AMBULATORY_CARE_PROVIDER_SITE_OTHER): Admitting: Family Medicine

## 2023-10-27 ENCOUNTER — Other Ambulatory Visit (HOSPITAL_BASED_OUTPATIENT_CLINIC_OR_DEPARTMENT_OTHER): Payer: Self-pay

## 2023-10-27 ENCOUNTER — Encounter: Payer: Self-pay | Admitting: Family Medicine

## 2023-10-27 VITALS — BP 110/74 | HR 68 | Temp 97.8°F | Ht 69.0 in | Wt 243.8 lb

## 2023-10-27 DIAGNOSIS — Z6836 Body mass index (BMI) 36.0-36.9, adult: Secondary | ICD-10-CM | POA: Diagnosis not present

## 2023-10-27 DIAGNOSIS — J302 Other seasonal allergic rhinitis: Secondary | ICD-10-CM | POA: Diagnosis not present

## 2023-10-27 DIAGNOSIS — Z903 Acquired absence of stomach [part of]: Secondary | ICD-10-CM | POA: Diagnosis not present

## 2023-10-27 DIAGNOSIS — R7303 Prediabetes: Secondary | ICD-10-CM

## 2023-10-27 DIAGNOSIS — E66812 Obesity, class 2: Secondary | ICD-10-CM | POA: Diagnosis not present

## 2023-10-27 DIAGNOSIS — Z0000 Encounter for general adult medical examination without abnormal findings: Secondary | ICD-10-CM | POA: Diagnosis not present

## 2023-10-27 MED ORDER — FLUTICASONE PROPIONATE 50 MCG/ACT NA SUSP
1.0000 | Freq: Every day | NASAL | 7 refills | Status: AC | PRN
  Filled 2023-10-27: qty 16, 30d supply, fill #0

## 2023-10-27 NOTE — Progress Notes (Signed)
 Established Patient Office Visit   Subjective  Patient ID: Julia Monroe, female    DOB: 05/23/1970  Age: 53 y.o. MRN: [id number]  Chief Complaint  Patient presents with   Annual Exam    Patient is a 53 year old female seen for CPE.  Patient states she is doing well overall.  Noticing low back pain after working additional shifts.  Followed at [facility] s/p gastric surgery for continued wt management, however they may stop taking [employer].  Taking phentermine  for 3 months at a time, then off for 2 months.  Pt taking MVIs consistently.  Will likely have blood work at  f/u with Rheumatology in the next few wks.  Had recent TSH, Free T4, CMP, will provide results.  Requesting refill on flonase .  Taking zyrtec.  Plans to switch to a diff allergy pill next yr.    Patient Active Problem List   Diagnosis Date Noted   Degenerative arthritis of left knee 07/10/2022   H/O gastric sleeve 02/12/2022   History of total hip arthroplasty, right 11/21/2018   Prediabetes 03/21/2018   SLE (systemic lupus erythematosus) (HCC) 03/21/2018   Obesity 03/21/2018   History of migraine 08/27/2017   Morbid obesity (HCC) 01/27/2017   Status post bilateral total hip replacement 10/27/2016   Hyperglycemia 08/25/2016   Leukocytosis 08/13/2016   Status post laparoscopic hysterectomy 07/06/2016   Primary osteoarthritis of both knees 04/29/2016   ANA positive 04/29/2016   Primary osteoarthritis of right hip 07/10/2011   Past Medical History:  Diagnosis Date   Arthritis    osteoarthritis   Blood transfusion without reported diagnosis    2013 Hip replacement   Complication of anesthesia    bottom of feet itching   Elevated hemoglobin A1c 2018   6.1   GERD (gastroesophageal reflux disease)    Herpes simplex type II infection 2020   Lupus Pt states its a blood disorder that has no name for it so MD listed as Lupus. Something with DNA 2018   Dr.Deveshwar, currently in normal range   Past Surgical  History:  Procedure Laterality Date   ABDOMINAL HYSTERECTOMY     CESAREAN SECTION  2000   CYSTOSCOPY N/A 07/06/2016   Procedure: CYSTOSCOPY;  Surgeon: Cathlyn JAYSON Nikki Bobie FORBES, MD;  Location: [facility];  Service: Gynecology;  Laterality: N/A;   ingrown toenail Left 07/28/2019   JOINT REPLACEMENT  L   Left hip replacement   LAPAROSCOPIC GASTRIC SLEEVE RESECTION N/A 03/21/2018   Procedure: LAPAROSCOPIC GASTRIC SLEEVE RESECTION, UPPER ENDO, ERAS Pathway;  Surgeon: Tanda Locus, MD;  Location: [facility];  Service: General;  Laterality: N/A;   OOPHORECTOMY  2007   Rt.oophorectomy    OVARIAN CYST SURGERY  ? and 10   rt removed,cyst off lft   TOTAL HIP ARTHROPLASTY  07/06/2011   Procedure: TOTAL HIP ARTHROPLASTY;  Surgeon: Dempsey JINNY Sensor, MD;  Location: [facility];  Service: Orthopedics;  Laterality: Left;  DEPUY/PINNACLE   TOTAL HIP ARTHROPLASTY Right 11/21/2018   Procedure: RIGHT TOTAL HIP ARTHROPLASTY ANTERIOR APPROACH;  Surgeon: Sensor Dempsey, MD;  Location: [facility];  Service: Orthopedics;  Laterality: Right;   TOTAL KNEE ARTHROPLASTY Left 07/20/2022   Procedure: LEFT TOTAL KNEE ARTHROPLASTY;  Surgeon: Sensor Dempsey, MD;  Location: [facility];  Service: Orthopedics;  Laterality: Left;   TOTAL LAPAROSCOPIC HYSTERECTOMY WITH SALPINGECTOMY Left 07/06/2016   Procedure: HYSTERECTOMY TOTAL LAPAROSCOPIC WITH Left SALPINGECTOMY, Suture Left Side Wall Laceration;  Surgeon: Cathlyn JAYSON Nikki Bobie FORBES, MD;  Location: [facility];  Service: Gynecology;  Laterality: Left;   Social History   Tobacco Use   Smoking status: Never    Passive exposure: Never   Smokeless tobacco: Never  Vaping Use   Vaping status: Never Used  Substance Use Topics   Alcohol use: No    Alcohol/week: 0.0 standard drinks of alcohol   Drug use: No   Family History  Problem Relation Age of Onset   Osteoarthritis Mother    Osteoarthritis Brother    Hypertension Brother    Diabetes Brother    Osteoarthritis Maternal Grandmother    Arthritis  Maternal Grandmother    Colon polyps Neg Hx    Colon cancer Neg Hx    Esophageal cancer Neg Hx    Stomach cancer Neg Hx    Rectal cancer Neg Hx    Allergies  Allergen Reactions   Monosodium Glutamate Other (See Comments)    Loses vision, headache/migraine   Other      Blood Product Refusal    Aspirin  Nausea And Vomiting   Hydromorphone      Makes patient Loopy and dreams terrible things   Lactose Intolerance (Gi) Other (See Comments)    Gi upset   Levocetirizine Itching   Caffeine Other (See Comments)    Severe headache   Hydrocodone  Other (See Comments)    Headaches/migraines    Oxycodone  Other (See Comments)    Headache/Migraine    ROS Negative unless stated above    Objective:     BP 110/74 (BP Location: Left Arm, Patient Position: Sitting, Cuff Size: Normal)   Pulse 68   Temp 97.8 F (36.6 C) (Oral)   Ht 5' 9 (1.753 m)   Wt 243 lb 12.8 oz (110.6 kg)   LMP 04/29/2016   SpO2 99%   BMI 36.00 kg/m  BP Readings from Last 3 Encounters:  10/27/23 110/74  06/09/23 108/75  04/26/23 118/74   Wt Readings from Last 3 Encounters:  10/27/23 243 lb 12.8 oz (110.6 kg)  06/09/23 246 lb 9.6 oz (111.9 kg)  04/26/23 251 lb 9.6 oz (114.1 kg)      Physical Exam Constitutional:      Appearance: Normal appearance.  HENT:     Head: Normocephalic and atraumatic.     Right Ear: Tympanic membrane, ear canal and external ear normal.     Left Ear: Tympanic membrane, ear canal and external ear normal.     Nose: Nose normal.     Mouth/Throat:     Mouth: Mucous membranes are moist.     Pharynx: No oropharyngeal exudate or posterior oropharyngeal erythema.  Eyes:     General: No scleral icterus.    Extraocular Movements: Extraocular movements intact.     Conjunctiva/sclera: Conjunctivae normal.     Pupils: Pupils are equal, round, and reactive to light.  Neck:     Thyroid : No thyromegaly.     Vascular: No carotid bruit.  Cardiovascular:     Rate and Rhythm: Normal  rate and regular rhythm.     Pulses: Normal pulses.     Heart sounds: Normal heart sounds. No murmur heard.    No friction rub.  Pulmonary:     Effort: Pulmonary effort is normal.     Breath sounds: Normal breath sounds. No wheezing, rhonchi or rales.  Abdominal:     General: Bowel sounds are normal.     Palpations: Abdomen is soft.     Tenderness: There is no abdominal tenderness.  Musculoskeletal:  General: No deformity. Normal range of motion.  Lymphadenopathy:     Cervical: No cervical adenopathy.  Skin:    General: Skin is warm and dry.     Findings: No lesion.  Neurological:     General: No focal deficit present.     Mental Status: She is alert and oriented to person, place, and time.  Psychiatric:        Mood and Affect: Mood normal.        Thought Content: Thought content normal.        10/27/2023    4:52 PM 04/26/2023    9:38 AM 10/26/2022    4:45 PM  Depression screen PHQ 2/9  Decreased Interest 0 0 0  Down, Depressed, Hopeless 0 0 0  PHQ - 2 Score 0 0 0  Altered sleeping 0 0 0  Tired, decreased energy 0 1 0  Change in appetite 0 0 0  Feeling bad or failure about yourself  0 0 0  Trouble concentrating 0 0 0  Moving slowly or fidgety/restless 0 0 0  Suicidal thoughts 0 0 0  PHQ-9 Score 0 1 0  Difficult doing work/chores Not difficult at all Not difficult at all Not difficult at all      10/27/2023    4:52 PM 04/26/2023    9:38 AM 10/26/2022    4:45 PM 06/03/2022    4:16 PM  GAD 7 : Generalized Anxiety Score  Nervous, Anxious, on Edge 0 0 0 0  Control/stop worrying 0 0 0 0  Worry too much - different things 0 0 0 0  Trouble relaxing 0 0 0 0  Restless 0 0 0 0  Easily annoyed or irritable 0 0 0 0  Afraid - awful might happen 0 0 0 0  Total GAD 7 Score 0 0 0 0  Anxiety Difficulty Not difficult at all Not difficult at all Not difficult at all      No results found for any visits on 10/27/23.    Assessment & Plan:   Well adult exam -     Lipid  panel; Future  Prediabetes  H/O gastric sleeve  Class 2 severe obesity with serious comorbidity and body mass index (BMI) of 36.0 to 36.9 in adult, unspecified obesity type  Seasonal allergies -     Fluticasone  Propionate; Place 1 spray into both nostrils daily as needed for allergies.  Dispense: 16 g; Refill: 7   Age appropriate screenings discussed.  Obtain Lipid panel.  Results from recent labs to be obtained.  Will also have labs in a few wks with Rheum.  Mammogram due 11/2023.  Colonoscopy up to date, done 02/11/23.  Immunizations reviewed.  Continue bariatric MVI.  Continue lifestyle modifications.  Flonase  prn and po antihistamine for allergies. No follow-ups on file.   Clotilda JONELLE Single, MD

## 2023-11-08 ENCOUNTER — Other Ambulatory Visit (HOSPITAL_BASED_OUTPATIENT_CLINIC_OR_DEPARTMENT_OTHER): Payer: Self-pay

## 2023-11-18 ENCOUNTER — Other Ambulatory Visit (HOSPITAL_BASED_OUTPATIENT_CLINIC_OR_DEPARTMENT_OTHER): Payer: Self-pay

## 2023-11-18 ENCOUNTER — Ambulatory Visit: Payer: Commercial Managed Care - PPO | Admitting: Obstetrics and Gynecology

## 2023-11-19 ENCOUNTER — Telehealth: Payer: Self-pay

## 2023-11-19 DIAGNOSIS — Z0000 Encounter for general adult medical examination without abnormal findings: Secondary | ICD-10-CM

## 2023-11-19 NOTE — Telephone Encounter (Signed)
 Copied from CRM [id number]. Topic: Clinical - Request for Lab/Test Order >> Nov 19, 2023  1:24 PM Suzen RAMAN wrote: Reason for CRM: patient request lipid panel order be changed to [facility] vs [facility]. Patient is okay with picking up requisition once adjustment is made if needed.     CB#[phone]

## 2023-11-19 NOTE — Telephone Encounter (Signed)
 Patient called the office to confirm when her next appointment was, advised that it is 01/05/2024 at 3:20PM

## 2023-11-30 NOTE — Progress Notes (Unsigned)
 53 y.o. G1P1001 Single African American female here for annual exam.    PCP: Mercer Clotilda SAUNDERS, MD   Patient's last menstrual period was 04/29/2016.           Sexually active: Yes.    The current method of family planning is status post hysterectomy.    Menopausal hormone therapy:  n/a Exercising: {yes no:314532}  {types:19826} Smoker:  no  OB History  Gravida Para Term Preterm AB Living  1 1 1   1   SAB IAB Ectopic Multiple Live Births          # Outcome Date GA Lbr Len/2nd Weight Sex Type Anes PTL Lv  1 Term              HEALTH MAINTENANCE: Last 2 paps:  09/05/12 neg  History of abnormal Pap or positive HPV:  no Mammogram:   12/04/22 Breast Density Cat B, BIRADS Cat 1 neg  Colonoscopy:  02/11/23 Bone Density:  n/a  Result  n/a   Immunization History  Administered Date(s) Administered   Influenza, Seasonal, Injecte, Preservative Fre 10/26/2022   Influenza-Unspecified 10/26/2013, 10/26/2016   PFIZER Comirnaty(Gray Top)Covid-19 Tri-Sucrose Vaccine 08/02/2020   PFIZER(Purple Top)SARS-COV-2 Vaccination 04/28/2019, 05/23/2019, 01/12/2020   Pneumococcal Conjugate-13 11/02/2016   Tdap 01/26/2009, 12/20/2018      reports that she has never smoked. She has never been exposed to tobacco smoke. She has never used smokeless tobacco. She reports that she does not drink alcohol and does not use drugs.  Past Medical History:  Diagnosis Date   Arthritis    osteoarthritis   Blood transfusion without reported diagnosis    2013 Hip replacement   Complication of anesthesia    bottom of feet itching   Elevated hemoglobin A1c 2018   6.1   GERD (gastroesophageal reflux disease)    Herpes simplex type II infection 2020   Lupus Pt states its a blood disorder that has no name for it so MD listed as Lupus. Something with DNA 2018   Dr.Deveshwar, currently in normal range    Past Surgical History:  Procedure Laterality Date   ABDOMINAL HYSTERECTOMY     CESAREAN SECTION  2000    CYSTOSCOPY N/A 07/06/2016   Procedure: CYSTOSCOPY;  Surgeon: Cathlyn JAYSON Nikki Bobie FORBES, MD;  Location: [facility];  Service: Gynecology;  Laterality: N/A;   ingrown toenail Left 07/28/2019   JOINT REPLACEMENT  L   Left hip replacement   LAPAROSCOPIC GASTRIC SLEEVE RESECTION N/A 03/21/2018   Procedure: LAPAROSCOPIC GASTRIC SLEEVE RESECTION, UPPER ENDO, ERAS Pathway;  Surgeon: Tanda Locus, MD;  Location: [facility];  Service: General;  Laterality: N/A;   OOPHORECTOMY  2007   Rt.oophorectomy    OVARIAN CYST SURGERY  ? and 10   rt removed,cyst off lft   TOTAL HIP ARTHROPLASTY  07/06/2011   Procedure: TOTAL HIP ARTHROPLASTY;  Surgeon: Dempsey JINNY Sensor, MD;  Location: [facility];  Service: Orthopedics;  Laterality: Left;  DEPUY/PINNACLE   TOTAL HIP ARTHROPLASTY Right 11/21/2018   Procedure: RIGHT TOTAL HIP ARTHROPLASTY ANTERIOR APPROACH;  Surgeon: Sensor Dempsey, MD;  Location: [facility];  Service: Orthopedics;  Laterality: Right;   TOTAL KNEE ARTHROPLASTY Left 07/20/2022   Procedure: LEFT TOTAL KNEE ARTHROPLASTY;  Surgeon: Sensor Dempsey, MD;  Location: [facility];  Service: Orthopedics;  Laterality: Left;   TOTAL LAPAROSCOPIC HYSTERECTOMY WITH SALPINGECTOMY Left 07/06/2016   Procedure: HYSTERECTOMY TOTAL LAPAROSCOPIC WITH Left SALPINGECTOMY, Suture Left Side Wall Laceration;  Surgeon: Cathlyn JAYSON Nikki Bobie FORBES, MD;  Location: [facility];  Service: Gynecology;  Laterality: Left;    Current Outpatient Medications  Medication Sig Dispense Refill   CASCARA SAGRADA PO Take 1 tablet by mouth daily.     cetirizine (ZYRTEC) 10 MG tablet Take 10 mg by mouth daily.     Cholecalciferol (VITAMIN D ) 125 MCG (5000 UT) CAPS Take 5,000 Units by mouth 3 (three) times a week.     diclofenac  (VOLTAREN ) 50 MG EC tablet Take 1 tablet (50 mg total) by mouth as needed for migraine. 20 tablet 0   diclofenac  Sodium (VOLTAREN ) 1 % GEL APPLY 2 TO 4 GRAMS TO AFFECTED JOINT UP TO 4 TIMES DAILY 400 g 2   fexofenadine  (ALLEGRA ) 180 MG tablet Take 1 tablet  (180 mg total) by mouth daily. 100 tablet 3   fluticasone  (FLONASE ) 50 MCG/ACT nasal spray Place 1 spray into both nostrils daily as needed for allergies. 16 g 7   Menthol , Topical Analgesic, (BIOFREEZE EX) Apply 1 application topically 2 (two) times daily as needed (pain).     Multiple Vitamins-Minerals (BARIATRIC MULTIVITAMINS/IRON  PO) Take 1 tablet by mouth 2 (two) times daily.     phentermine  (ADIPEX-P ) 37.5 MG tablet Take 1 tablet (37.5 mg total) by mouth every morning before breakfast 30 tablet 0   phentermine  37.5 MG capsule Take 1 capsule (37.5 mg total) by mouth daily before breakfast every morning. 30 capsule 0   Tetrahydrozoline HCl (VISINE OP) Place 1 drop into both eyes 3 (three) times daily as needed (for irritated/dry eyes.).     traMADol  (ULTRAM ) 50 MG tablet Take 1 tablet (50 mg total) by mouth 3 (three) times daily as needed for pain. 90 tablet 2   No current facility-administered medications for this visit.    ALLERGIES: Monosodium glutamate, Other, Aspirin , Hydromorphone , Lactose intolerance (gi), Levocetirizine, Caffeine, Hydrocodone , and Oxycodone   Family History  Problem Relation Age of Onset   Osteoarthritis Mother    Osteoarthritis Brother    Hypertension Brother    Diabetes Brother    Osteoarthritis Maternal Grandmother    Arthritis Maternal Grandmother    Colon polyps Neg Hx    Colon cancer Neg Hx    Esophageal cancer Neg Hx    Stomach cancer Neg Hx    Rectal cancer Neg Hx     Review of Systems  PHYSICAL EXAM:  LMP 04/29/2016     General appearance: alert, cooperative and appears stated age Head: normocephalic, without obvious abnormality, atraumatic Neck: no adenopathy, supple, symmetrical, trachea midline and thyroid  normal to inspection and palpation Lungs: clear to auscultation bilaterally Breasts: normal appearance, no masses or tenderness, No nipple retraction or dimpling, No nipple discharge or bleeding, No axillary adenopathy Heart: regular  rate and rhythm Abdomen: soft, non-tender; no masses, no organomegaly Extremities: extremities normal, atraumatic, no cyanosis or edema Skin: skin color, texture, turgor normal. No rashes or lesions Lymph nodes: cervical, supraclavicular, and axillary nodes normal. Neurologic: grossly normal  Pelvic: External genitalia:  no lesions              No abnormal inguinal nodes palpated.              Urethra:  normal appearing urethra with no masses, tenderness or lesions              Bartholins and Skenes: normal                 Vagina: normal appearing vagina with normal color and discharge, no lesions  Cervix: no lesions              Pap taken: {yes no:314532} Bimanual Exam:  Uterus:  normal size, contour, position, consistency, mobility, non-tender              Adnexa: no mass, fullness, tenderness              Rectal exam: {yes no:314532}.  Confirms.              Anus:  normal sphincter tone, no lesions  Chaperone was present for exam:  {BSCHAPERONE:31226::Emily F, CMA}  ASSESSMENT: Well woman visit with gynecologic exam.  PHQ-2-9: ***  ***  PLAN: Mammogram screening discussed. Self breast awareness reviewed. Pap and HRV collected:  {yes no:314532} Guidelines for Calcium, Vitamin D , regular exercise program including cardiovascular and weight bearing exercise. Medication refills:  *** {LABS (Optional):23779} Follow up:  ***    Additional counseling given.  {yes [id number]. ***  total time was spent for this patient encounter, including preparation, face-to-face counseling with the patient, coordination of care, and documentation of the encounter in addition to doing the well woman visit with gynecologic exam.

## 2023-12-01 ENCOUNTER — Telehealth: Payer: Self-pay | Admitting: Obstetrics and Gynecology

## 2023-12-01 ENCOUNTER — Ambulatory Visit: Admitting: Obstetrics and Gynecology

## 2023-12-01 ENCOUNTER — Encounter: Payer: Self-pay | Admitting: Obstetrics and Gynecology

## 2023-12-01 VITALS — BP 116/78 | HR 69 | Ht 69.0 in | Wt 246.0 lb

## 2023-12-01 DIAGNOSIS — N898 Other specified noninflammatory disorders of vagina: Secondary | ICD-10-CM

## 2023-12-01 DIAGNOSIS — Z01419 Encounter for gynecological examination (general) (routine) without abnormal findings: Secondary | ICD-10-CM | POA: Diagnosis not present

## 2023-12-01 DIAGNOSIS — Z1331 Encounter for screening for depression: Secondary | ICD-10-CM

## 2023-12-01 NOTE — Patient Instructions (Signed)

## 2023-12-01 NOTE — Telephone Encounter (Signed)
 Please place order for FSH and estradiol at [facility] at [facility].  Diagnosis:  vaginal dryness.

## 2023-12-02 ENCOUNTER — Other Ambulatory Visit: Payer: Self-pay

## 2023-12-02 ENCOUNTER — Other Ambulatory Visit (HOSPITAL_BASED_OUTPATIENT_CLINIC_OR_DEPARTMENT_OTHER): Payer: Self-pay

## 2023-12-02 DIAGNOSIS — Z9884 Bariatric surgery status: Secondary | ICD-10-CM | POA: Diagnosis not present

## 2023-12-02 DIAGNOSIS — N898 Other specified noninflammatory disorders of vagina: Secondary | ICD-10-CM

## 2023-12-02 DIAGNOSIS — R7303 Prediabetes: Secondary | ICD-10-CM | POA: Diagnosis not present

## 2023-12-02 DIAGNOSIS — E66812 Obesity, class 2: Secondary | ICD-10-CM | POA: Diagnosis not present

## 2023-12-02 MED ORDER — PHENTERMINE HCL 37.5 MG PO CAPS
37.5000 mg | ORAL_CAPSULE | Freq: Every morning | ORAL | 0 refills | Status: DC
  Filled 2023-12-02: qty 30, 30d supply, fill #0

## 2023-12-02 NOTE — Telephone Encounter (Signed)
 Orders for [facility] have been placed.

## 2023-12-10 ENCOUNTER — Other Ambulatory Visit (HOSPITAL_BASED_OUTPATIENT_CLINIC_OR_DEPARTMENT_OTHER): Payer: Self-pay | Admitting: Family Medicine

## 2023-12-10 ENCOUNTER — Encounter (HOSPITAL_BASED_OUTPATIENT_CLINIC_OR_DEPARTMENT_OTHER): Payer: Self-pay | Admitting: Radiology

## 2023-12-10 ENCOUNTER — Ambulatory Visit (HOSPITAL_BASED_OUTPATIENT_CLINIC_OR_DEPARTMENT_OTHER)
Admission: RE | Admit: 2023-12-10 | Discharge: 2023-12-10 | Disposition: A | Discharge status: Home / Self Care | Source: Ambulatory Visit | Attending: Family Medicine | Admitting: Family Medicine

## 2023-12-10 DIAGNOSIS — Z1231 Encounter for screening mammogram for malignant neoplasm of breast: Secondary | ICD-10-CM

## 2023-12-22 NOTE — Progress Notes (Signed)
 Office Visit Note  Patient: Julia Monroe             Date of Birth: [birth year]           MRN: [id number]             PCP: Mercer Clotilda SAUNDERS, MD Referring: Mercer Clotilda SAUNDERS, MD Visit Date: 01/05/2024 Occupation: PHLEBOTOMIST  Subjective:  Joint stiffness   History of Present Illness: Julia Monroe is a 53 y.o. 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  She returns today after her last visit in May 2025.  She denies any history of oral ulcers, nasal ulcers, sicca symptoms, malar rash, photosensitivity, Raynaud's or inflammatory arthritis.  She continues to have some discomfort in her upper back, lower back and her arms.  She states her knee joints hurt prior to the weather change.    Activities of Daily Living:  Patient reports morning stiffness for 10-15 minutes.   Patient Reports nocturnal pain.  Difficulty dressing/grooming: Denies Difficulty climbing stairs: Denies Difficulty getting out of chair: Reports Difficulty using hands for taps, buttons, cutlery, and/or writing: Denies  Review of Systems  Constitutional:  Negative for fatigue.  HENT:  Negative for mouth sores and mouth dryness.   Eyes:  Negative for dryness.  Respiratory:  Negative for shortness of breath.   Cardiovascular:  Negative for chest pain and palpitations.  Gastrointestinal:  Negative for blood in stool, constipation and diarrhea.  Endocrine: Positive for increased urination.  Genitourinary:  Negative for involuntary urination.  Musculoskeletal:  Positive for joint pain, joint pain, myalgias, morning stiffness, muscle tenderness and myalgias. Negative for gait problem, joint swelling and muscle weakness.  Skin:  Negative for color change, rash, hair loss and sensitivity to sunlight.  Allergic/Immunologic: Negative for susceptible to infections.  Neurological:  Negative for dizziness and headaches.  Hematological:  Negative for swollen glands.   Psychiatric/Behavioral:  Negative for depressed mood and sleep disturbance. The patient is not nervous/anxious.     PMFS History:  Patient Active Problem List   Diagnosis Date Noted   Degenerative arthritis of left knee 07/10/2022   H/O gastric sleeve 02/12/2022   History of total hip arthroplasty, right 11/21/2018   Prediabetes 03/21/2018   SLE (systemic lupus erythematosus) (HCC) 03/21/2018   Obesity 03/21/2018   History of migraine 08/27/2017   Morbid obesity (HCC) 01/27/2017   Status post bilateral total hip replacement 10/27/2016   Hyperglycemia 08/25/2016   Leukocytosis 08/13/2016   Status post laparoscopic hysterectomy 07/06/2016   Primary osteoarthritis of both knees 04/29/2016   ANA positive 04/29/2016   Primary osteoarthritis of right hip 07/10/2011    Past Medical History:  Diagnosis Date   Arthritis    osteoarthritis   Blood transfusion without reported diagnosis    2013 Hip replacement   Complication of anesthesia    bottom of feet itching   Elevated hemoglobin A1c 2018   6.1   GERD (gastroesophageal reflux disease)    Herpes simplex type II infection 2020   Lupus Pt states its a blood disorder that has no name for it so MD listed as Lupus. Something with DNA 2018   Dr.Shiri Hodapp, currently in normal range    Family History  Problem Relation Age of Onset   Cancer Mother 31       endometrial cancer   Osteoarthritis Mother    Osteoarthritis Brother    Hypertension Brother    Diabetes Brother    Osteoarthritis Maternal Grandmother    Arthritis  Maternal Grandmother    Colon polyps Neg Hx    Colon cancer Neg Hx    Esophageal cancer Neg Hx    Stomach cancer Neg Hx    Rectal cancer Neg Hx    Past Surgical History:  Procedure Laterality Date   ABDOMINAL HYSTERECTOMY     CESAREAN SECTION  2000   CYSTOSCOPY N/A 07/06/2016   Procedure: CYSTOSCOPY;  Surgeon: Cathlyn JAYSON Nikki Bobie FORBES, MD;  Location: [facility];  Service: Gynecology;  Laterality: N/A;   ingrown  toenail Left 07/28/2019   JOINT REPLACEMENT  L   Left hip replacement   LAPAROSCOPIC GASTRIC SLEEVE RESECTION N/A 03/21/2018   Procedure: LAPAROSCOPIC GASTRIC SLEEVE RESECTION, UPPER ENDO, ERAS Pathway;  Surgeon: Tanda Locus, MD;  Location: [facility];  Service: General;  Laterality: N/A;   OOPHORECTOMY  2007   Rt.oophorectomy    OVARIAN CYST SURGERY  ? and 10   rt removed,cyst off lft   TOTAL HIP ARTHROPLASTY  07/06/2011   Procedure: TOTAL HIP ARTHROPLASTY;  Surgeon: Dempsey JINNY Sensor, MD;  Location: [facility];  Service: Orthopedics;  Laterality: Left;  DEPUY/PINNACLE   TOTAL HIP ARTHROPLASTY Right 11/21/2018   Procedure: RIGHT TOTAL HIP ARTHROPLASTY ANTERIOR APPROACH;  Surgeon: Sensor Dempsey, MD;  Location: [facility];  Service: Orthopedics;  Laterality: Right;   TOTAL KNEE ARTHROPLASTY Left 07/20/2022   Procedure: LEFT TOTAL KNEE ARTHROPLASTY;  Surgeon: Sensor Dempsey, MD;  Location: [facility];  Service: Orthopedics;  Laterality: Left;   TOTAL LAPAROSCOPIC HYSTERECTOMY WITH SALPINGECTOMY Left 07/06/2016   Procedure: HYSTERECTOMY TOTAL LAPAROSCOPIC WITH Left SALPINGECTOMY, Suture Left Side Wall Laceration;  Surgeon: Cathlyn JAYSON Nikki Bobie FORBES, MD;  Location: [facility];  Service: Gynecology;  Laterality: Left;   Social History   Tobacco Use   Smoking status: Never    Passive exposure: Never   Smokeless tobacco: Never  Vaping Use   Vaping status: Never Used  Substance Use Topics   Alcohol use: No    Alcohol/week: 0.0 standard drinks of alcohol   Drug use: No   Social History   Social History Narrative   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[employer]      Home Situation: 65 yo daughter in 2018      Spiritual Beliefs:      Lifestyle: starting healthy low sugar diet 08/2016/considering exercising at the [employer] History  Administered Date(s) Administered   Influenza, Seasonal, Injecte, Preservative Fre 10/26/2022   Influenza-Unspecified 10/26/2013, 10/26/2016   PFIZER Comirnaty(Gray Top)Covid-19  Tri-Sucrose Vaccine 08/02/2020   PFIZER(Purple Top)SARS-COV-2 Vaccination 04/28/2019, 05/23/2019, 01/12/2020   Pneumococcal Conjugate-13 11/02/2016   Tdap 01/26/2009, 12/20/2018     Objective: Vital Signs: BP 106/70   Pulse 80   Temp (!) 96.3 F (35.7 C)   Resp 14   Ht 5' 9 (1.753 m)   Wt 246 lb (111.6 kg)   LMP 04/29/2016   BMI 36.33 kg/m    Physical Exam Vitals and nursing note reviewed.  Constitutional:      Appearance: She is well-developed.  HENT:     Head: Normocephalic and atraumatic.  Eyes:     Conjunctiva/sclera: Conjunctivae normal.  Cardiovascular:     Rate and Rhythm: Normal rate and regular rhythm.     Heart sounds: Normal heart sounds.  Pulmonary:     Effort: Pulmonary effort is normal.     Breath sounds: Normal breath sounds.  Abdominal:     General: Bowel sounds are normal.     Palpations: Abdomen  is soft.  Musculoskeletal:     Cervical back: Normal range of motion.  Lymphadenopathy:     Cervical: No cervical adenopathy.  Skin:    General: Skin is warm and dry.     Capillary Refill: Capillary refill takes less than 2 seconds.  Neurological:     Mental Status: She is alert and oriented to person, place, and time.  Psychiatric:        Behavior: Behavior normal.      Musculoskeletal Exam: Cervical spine was in good range of motion.  She had bilateral trapezius spasm and thoracic kyphosis.  She had tenderness over trapezius region.  There was no tenderness with range of motion of the lumbar spine.  Shoulders, elbows, wrist joints, MCPs PIPs and DIPs were in good range of motion with no synovitis.  Hip joints and knee joints in good range of motion.  Left knee joint was replaced.  There was no tenderness over ankles or MTPs.  CDAI Exam: CDAI Score: -- Patient Global: --; Provider Global: -- Swollen: --; Tender: -- Joint Exam 01/05/2024   No joint exam has been documented for this visit   There is currently no information documented on the  homunculus. Go to the Rheumatology activity and complete the homunculus joint exam.  Investigation: No additional findings.  Imaging: MM 3D SCREENING MAMMOGRAM BILATERAL BREAST Result Date: 12/14/2023 CLINICAL DATA:  Screening. EXAM: DIGITAL SCREENING BILATERAL MAMMOGRAM WITH TOMOSYNTHESIS AND CAD TECHNIQUE: Bilateral screening digital craniocaudal and mediolateral oblique mammograms were obtained. Bilateral screening digital breast tomosynthesis was performed. The images were evaluated with computer-aided detection. COMPARISON:  Previous exam(s). ACR Breast Density Category b: There are scattered areas of fibroglandular density. FINDINGS: There are no findings suspicious for malignancy. IMPRESSION: No mammographic evidence of malignancy. A result letter of this screening mammogram will be mailed directly to the patient. RECOMMENDATION: Screening mammogram in one year. (Code:SM-B-01Y) BI-RADS CATEGORY  1: Negative. Electronically Signed   By: Dina  Arceo M.D.   On: 12/14/2023 08:08    Recent Labs: Lab Results  Component Value Date   WBC 6.6 06/09/2023   HGB 13.0 06/09/2023   PLT 246 06/09/2023   NA 139 06/09/2023   K 4.4 06/09/2023   CL 105 06/09/2023   CO2 28 06/09/2023   GLUCOSE 81 06/09/2023   BUN 14 06/09/2023   CREATININE 0.61 06/09/2023   BILITOT 0.4 06/09/2023   ALKPHOS 49 05/11/2018   AST 16 06/09/2023   ALT 10 06/09/2023   PROT 6.9 06/09/2023   ALBUMIN  4.1 05/11/2018   CALCIUM 9.7 06/09/2023   GFRAA 101 07/05/2020    Speciality Comments: PLQ eye exam: 11/29/2020 [facility] [employer]. Follow up in 1 year.    Procedures:  No procedures performed Allergies: Monosodium glutamate, Other, Aspirin , Hydromorphone , Lactose intolerance (gi), Levocetirizine, Codeine , Lactose, Caffeine, Hydrocodone , and Oxycodone    Assessment / Plan:     Visit Diagnoses: Undifferentiated connective tissue disease - History of positive ANA, DS DNA, elevated ESR, arthralgias, rash: She  denies any history of recent rash.  Her sed rate has been normal.  Labs in May 2025 showed positive double-stranded ENA at 15, ANA negative, complements normal and sed rate normal.  She denies any history of oral ulcers, nasal ulcers, malar rash, photosensitivity, Raynaud's or lymphadenopathy.  She continues to have some arthralgias.  She describes discomfort mostly in her neck and shoulders and sometimes in her knees.  Lateral epicondylitis, left elbow-currently not symptomatic.  Status post bilateral total hip replacement-she had good  range of motion without any discomfort.  Status post total knee replacement, left - July 20, 2022 by Dr. Liam.  She had good range of motion.  Primary osteoarthritis of right knee-she has intermittent discomfort in her knee joints.  Trapezius muscle spasm-she has bilateral trapezius spasm.  Range of motion exercises were advised and a handout was placed in the AVS.  She also has some lower back stiffness.  A handout on back exercise was given.  Vitamin D  deficiency-MND was normal at 45 on Jun 09, 2023.  History of migraine  Orders: Orders Placed This Encounter  Procedures   CBC with Differential/Platelet   CMP14+EGFR   Lipid panel   ANA   Sedimentation rate   C3 and C4   Anti-DNA antibody, double-stranded   Protein / creatinine ratio, urine   No orders of the defined types were placed in this encounter.    Follow-Up Instructions: Return in 1 year (on 01/04/2025) for UCTD.   Maya Nash, MD  Note - This record has been created using Animal nutritionist.  Chart creation errors have been sought, but may not always  have been located. Such creation errors do not reflect on  the standard of medical care.

## 2023-12-31 ENCOUNTER — Telehealth: Payer: Self-pay | Admitting: *Deleted

## 2023-12-31 DIAGNOSIS — R5383 Other fatigue: Secondary | ICD-10-CM

## 2023-12-31 DIAGNOSIS — Z79899 Other long term (current) drug therapy: Secondary | ICD-10-CM

## 2023-12-31 DIAGNOSIS — M359 Systemic involvement of connective tissue, unspecified: Secondary | ICD-10-CM

## 2023-12-31 DIAGNOSIS — E559 Vitamin D deficiency, unspecified: Secondary | ICD-10-CM

## 2023-12-31 NOTE — Telephone Encounter (Signed)
 Patient requested  lab order to be released. Labs have been released for patient.

## 2024-01-05 ENCOUNTER — Ambulatory Visit: Attending: Rheumatology | Admitting: Rheumatology

## 2024-01-05 ENCOUNTER — Encounter: Payer: Self-pay | Admitting: Rheumatology

## 2024-01-05 VITALS — BP 106/70 | HR 80 | Temp 96.3°F | Resp 14 | Ht 69.0 in | Wt 246.0 lb

## 2024-01-05 DIAGNOSIS — Z96643 Presence of artificial hip joint, bilateral: Secondary | ICD-10-CM

## 2024-01-05 DIAGNOSIS — M62838 Other muscle spasm: Secondary | ICD-10-CM

## 2024-01-05 DIAGNOSIS — Z8669 Personal history of other diseases of the nervous system and sense organs: Secondary | ICD-10-CM

## 2024-01-05 DIAGNOSIS — E559 Vitamin D deficiency, unspecified: Secondary | ICD-10-CM

## 2024-01-05 DIAGNOSIS — Z96652 Presence of left artificial knee joint: Secondary | ICD-10-CM

## 2024-01-05 DIAGNOSIS — M1711 Unilateral primary osteoarthritis, right knee: Secondary | ICD-10-CM | POA: Diagnosis not present

## 2024-01-05 DIAGNOSIS — Z79899 Other long term (current) drug therapy: Secondary | ICD-10-CM | POA: Diagnosis not present

## 2024-01-05 DIAGNOSIS — M359 Systemic involvement of connective tissue, unspecified: Secondary | ICD-10-CM

## 2024-01-05 DIAGNOSIS — M7712 Lateral epicondylitis, left elbow: Secondary | ICD-10-CM | POA: Diagnosis not present

## 2024-01-05 NOTE — Patient Instructions (Signed)
 Cervical Strain and Sprain Rehab Ask your health care provider which exercises are safe for you. Do exercises exactly as told by your health care provider and adjust them as directed. It is normal to feel mild stretching, pulling, tightness, or discomfort as you do these exercises. Stop right away if you feel sudden pain or your pain gets worse. Do not begin these exercises until told by your health care provider. Stretching and range-of-motion exercises Cervical side bending  Using good posture, sit on a stable chair or stand up. Without moving your shoulders, slowly tilt your left / right ear to your shoulder until you feel a stretch in the neck muscles on the opposite side. You should be looking straight ahead. Hold for __________ seconds. Repeat with the other side of your neck. Repeat __________ times. Complete this exercise __________ times a day. Cervical rotation  Using good posture, sit on a stable chair or stand up. Slowly turn your head to the side as if you are looking over your left / right shoulder. Keep your eyes level with the ground. Stop when you feel a stretch along the side and the back of your neck. Hold for __________ seconds. Repeat this by turning to your other side. Repeat __________ times. Complete this exercise __________ times a day. Thoracic extension and pectoral stretch  Roll a towel or a small blanket so it is about 4 inches (10 cm) in diameter. Lie down on your back on a firm surface. Put the towel in the middle of your back across your spine. It should not be under your shoulder blades. Put your hands behind your head and let your elbows fall out to your sides. Hold for __________ seconds. Repeat __________ times. Complete this exercise __________ times a day. Strengthening exercises Upper cervical flexion  Lie on your back with a thin pillow behind your head or a small, rolled-up towel under your neck. Gently tuck your chin toward your chest and nod  your head down to look toward your feet. Do not lift your head off the pillow. Hold for __________ seconds. Release the tension slowly. Relax your neck muscles completely before you repeat this exercise. Repeat __________ times. Complete this exercise __________ times a day. Cervical extension  Stand about 6 inches (15 cm) away from a wall, with your back facing the wall. Place a soft object, about 6-8 inches (15-20 cm) in diameter, between the back of your head and the wall. A soft object could be a small pillow, a ball, or a folded towel. Gently tilt your head back and press into the soft object. Keep your jaw and forehead relaxed. Hold for __________ seconds. Release the tension slowly. Relax your neck muscles completely before you repeat this exercise. Repeat __________ times. Complete this exercise __________ times a day. Posture and body mechanics Body mechanics refer to the movements and positions of your body while you do your daily activities. Posture is part of body mechanics. Good posture and healthy body mechanics can help to relieve stress in your body's tissues and joints. Good posture means that your spine is in its natural S-curve position (your spine is neutral), your shoulders are pulled back slightly, and your head is not tipped forward. The following are general guidelines for using improved posture and body mechanics in your everyday activities. Sitting  When sitting, keep your spine neutral and keep your feet flat on the floor. Use a footrest, if needed, and keep your thighs parallel to the floor. Avoid rounding  your shoulders. Avoid tilting your head forward. When working at a desk or a computer, keep your desk at a height where your hands are slightly lower than your elbows. Slide your chair under your desk so you are close enough to maintain good posture. When working at a computer, place your monitor at a height where you are looking straight ahead and you do not have to  tilt your head forward or downward to look at the screen. Standing  When standing, keep your spine neutral and keep your feet about hip-width apart. Keep a slight bend in your knees. Your ears, shoulders, and hips should line up. When you do a task in which you stand in one place for a long time, place one foot up on a stable object that is 2-4 inches (5-10 cm) high, such as a footstool. This helps keep your spine neutral. Resting When lying down and resting, avoid positions that are most painful for you. Try to support your neck in a neutral position. You can use a contour pillow or a small rolled-up towel. Your pillow should support your neck but not push on it. This information is not intended to replace advice given to you by your health care provider. Make sure you discuss any questions you have with your health care provider. Document Revised: 05/18/2022 Document Reviewed: 08/04/2021 [employer] Patient Education  2024 [employer] Low Back Sprain or Strain Rehab Ask your health care provider which exercises are safe for you. Do exercises exactly as told by your health care provider and adjust them as directed. It is normal to feel mild stretching, pulling, tightness, or discomfort as you do these exercises. Stop right away if you feel sudden pain or your pain gets worse. Do not begin these exercises until told by your health care provider. Stretching and range-of-motion exercises These exercises warm up your muscles and joints and improve the movement and flexibility of your back. These exercises also help to relieve pain, numbness, and tingling. Lumbar rotation  Lie on your back on a firm bed or the floor with your knees bent. Straighten your arms out to your sides so each arm forms a 90-degree angle (right angle) with a side of your body. Slowly move (rotate) both of your knees to one side of your body until you feel a stretch in your lower back (lumbar). Try not to let your shoulders lift  off the floor. Hold this position for __________ seconds. Tense your abdominal muscles and slowly move your knees back to the starting position. Repeat this exercise on the other side of your body. Repeat __________ times. Complete this exercise __________ times a day. Single knee to chest  Lie on your back on a firm bed or the floor with both legs straight. Bend one of your knees. Use your hands to move your knee up toward your chest until you feel a gentle stretch in your lower back and buttock. Hold your leg in this position by holding on to the front of your knee. Keep your other leg as straight as possible. Hold this position for __________ seconds. Slowly return to the starting position. Repeat with your other leg. Repeat __________ times. Complete this exercise __________ times a day. Prone extension on elbows  Lie on your abdomen on a firm bed or the floor (prone position). Prop yourself up on your elbows. Use your arms to help lift your chest up until you feel a gentle stretch in your abdomen and your lower back.  This will place some of your body weight on your elbows. If this is uncomfortable, try stacking pillows under your chest. Your hips should stay down, against the surface that you are lying on. Keep your hip and back muscles relaxed. Hold this position for __________ seconds. Slowly relax your upper body and return to the starting position. Repeat __________ times. Complete this exercise __________ times a day. Strengthening exercises These exercises build strength and endurance in your back. Endurance is the ability to use your muscles for a long time, even after they get tired. Pelvic tilt This exercise strengthens the muscles that lie deep in the abdomen. Lie on your back on a firm bed or the floor with your legs extended. Bend your knees so they are pointing toward the ceiling and your feet are flat on the floor. Tighten your lower abdominal muscles to press your  lower back against the floor. This motion will tilt your pelvis so your tailbone points up toward the ceiling instead of pointing to your feet or the floor. To help with this exercise, you may place a small towel under your lower back and try to push your back into the towel. Hold this position for __________ seconds. Let your muscles relax completely before you repeat this exercise. Repeat __________ times. Complete this exercise __________ times a day. Alternating arm and leg raises  Get on your hands and knees on a firm surface. If you are on a hard floor, you may want to use padding, such as an exercise mat, to cushion your knees. Line up your arms and legs. Your hands should be directly below your shoulders, and your knees should be directly below your hips. Lift your left leg behind you. At the same time, raise your right arm and straighten it in front of you. Do not lift your leg higher than your hip. Do not lift your arm higher than your shoulder. Keep your abdominal and back muscles tight. Keep your hips facing the ground. Do not arch your back. Keep your balance carefully, and do not hold your breath. Hold this position for __________ seconds. Slowly return to the starting position. Repeat with your right leg and your left arm. Repeat __________ times. Complete this exercise __________ times a day. Abdominal set with straight leg raise  Lie on your back on a firm bed or the floor. Bend one of your knees and keep your other leg straight. Tense your abdominal muscles and lift your straight leg up, 4-6 inches (10-15 cm) off the ground. Keep your abdominal muscles tight and hold this position for __________ seconds. Do not hold your breath. Do not arch your back. Keep it flat against the ground. Keep your abdominal muscles tense as you slowly lower your leg back to the starting position. Repeat with your other leg. Repeat __________ times. Complete this exercise __________ times a  day. Single leg lower with bent knees Lie on your back on a firm bed or the floor. Tense your abdominal muscles and lift your feet off the floor, one foot at a time, so your knees and hips are bent in 90-degree angles (right angles). Your knees should be over your hips and your lower legs should be parallel to the floor. Keeping your abdominal muscles tense and your knee bent, slowly lower one of your legs so your toe touches the ground. Lift your leg back up to return to the starting position. Do not hold your breath. Do not let your back arch. Keep  your back flat against the ground. Repeat with your other leg. Repeat __________ times. Complete this exercise __________ times a day. Posture and body mechanics Good posture and healthy body mechanics can help to relieve stress in your body's tissues and joints. Body mechanics refers to the movements and positions of your body while you do your daily activities. Posture is part of body mechanics. Good posture means: Your spine is in its natural S-curve position (neutral). Your shoulders are pulled back slightly. Your head is not tipped forward (neutral). Follow these guidelines to improve your posture and body mechanics in your everyday activities. Standing  When standing, keep your spine neutral and your feet about hip-width apart. Keep a slight bend in your knees. Your ears, shoulders, and hips should line up. When you do a task in which you stand in one place for a long time, place one foot up on a stable object that is 2-4 inches (5-10 cm) high, such as a footstool. This helps keep your spine neutral. Sitting  When sitting, keep your spine neutral and keep your feet flat on the floor. Use a footrest, if necessary, and keep your thighs parallel to the floor. Avoid rounding your shoulders, and avoid tilting your head forward. When working at a desk or a computer, keep your desk at a height where your hands are slightly lower than your elbows.  Slide your chair under your desk so you are close enough to maintain good posture. When working at a computer, place your monitor at a height where you are looking straight ahead and you do not have to tilt your head forward or downward to look at the screen. Resting When lying down and resting, avoid positions that are most painful for you. If you have pain with activities such as sitting, bending, stooping, or squatting, lie in a position in which your body does not bend very much. For example, avoid curling up on your side with your arms and knees near your chest (fetal position). If you have pain with activities such as standing for a long time or reaching with your arms, lie with your spine in a neutral position and bend your knees slightly. Try the following positions: Lying on your side with a pillow between your knees. Lying on your back with a pillow under your knees. Lifting  When lifting objects, keep your feet at least shoulder-width apart and tighten your abdominal muscles. Bend your knees and hips and keep your spine neutral. It is important to lift using the strength of your legs, not your back. Do not lock your knees straight out. Always ask for help to lift heavy or awkward objects. This information is not intended to replace advice given to you by your health care provider. Make sure you discuss any questions you have with your health care provider. Document Revised: 05/18/2022 Document Reviewed: 04/01/2020 [employer] Patient Education  2024 [employer].

## 2024-01-07 ENCOUNTER — Other Ambulatory Visit (HOSPITAL_BASED_OUTPATIENT_CLINIC_OR_DEPARTMENT_OTHER): Payer: Self-pay

## 2024-01-08 ENCOUNTER — Other Ambulatory Visit (HOSPITAL_BASED_OUTPATIENT_CLINIC_OR_DEPARTMENT_OTHER): Payer: Self-pay

## 2024-01-11 ENCOUNTER — Other Ambulatory Visit (HOSPITAL_BASED_OUTPATIENT_CLINIC_OR_DEPARTMENT_OTHER): Payer: Self-pay

## 2024-01-12 ENCOUNTER — Other Ambulatory Visit (HOSPITAL_BASED_OUTPATIENT_CLINIC_OR_DEPARTMENT_OTHER): Payer: Self-pay

## 2024-01-12 MED ORDER — PHENTERMINE HCL 37.5 MG PO CAPS
ORAL_CAPSULE | ORAL | 0 refills | Status: AC
  Filled 2024-01-12: qty 30, 30d supply, fill #0

## 2024-01-15 ENCOUNTER — Other Ambulatory Visit (HOSPITAL_BASED_OUTPATIENT_CLINIC_OR_DEPARTMENT_OTHER): Payer: Self-pay

## 2024-01-25 ENCOUNTER — Ambulatory Visit: Payer: Self-pay | Admitting: Rheumatology

## 2024-01-25 LAB — FOLLICLE STIMULATING HORMONE: FSH: 55.5 m[IU]/mL

## 2024-01-25 LAB — ESTRADIOL: Estradiol: <5 pg/mL

## 2024-01-25 NOTE — Progress Notes (Signed)
 CBC normal, CMP normal, ANA positive, double-stranded DNA positive, lipid panel normal, sed rate normal, complements normal, urine protein creatinine ratio pending.  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  Patient should notify us  if she develops any new symptoms.

## 2024-01-26 ENCOUNTER — Other Ambulatory Visit (HOSPITAL_BASED_OUTPATIENT_CLINIC_OR_DEPARTMENT_OTHER): Payer: Self-pay

## 2024-01-26 LAB — CBC WITH DIFFERENTIAL/PLATELET
Basophils Absolute: 0 x10E3/uL (ref 0.0–0.2)
Basos: 0 %
EOS (ABSOLUTE): 0.1 x10E3/uL (ref 0.0–0.4)
Eos: 1 %
Hematocrit: 40.8 % (ref 34.0–46.6)
Hemoglobin: 13.1 g/dL (ref 11.1–15.9)
Immature Grans (Abs): 0 x10E3/uL (ref 0.0–0.1)
Immature Granulocytes: 0 %
Lymphocytes Absolute: 1.8 x10E3/uL (ref 0.7–3.1)
Lymphs: 31 %
MCH: 28.9 pg (ref 26.6–33.0)
MCHC: 32.1 g/dL (ref 31.5–35.7)
MCV: 90 fL (ref 79–97)
Monocytes Absolute: 0.5 x10E3/uL (ref 0.1–0.9)
Monocytes: 8 %
Neutrophils Absolute: 3.5 x10E3/uL (ref 1.4–7.0)
Neutrophils: 60 %
Platelets: 217 x10E3/uL (ref 150–450)
RBC: 4.53 x10E6/uL (ref 3.77–5.28)
RDW: 12.8 % (ref 11.7–15.4)
WBC: 5.9 x10E3/uL (ref 3.4–10.8)

## 2024-01-26 LAB — CMP14+EGFR
ALT: 14 IU/L (ref 0–32)
AST: 18 IU/L (ref 0–40)
Albumin: 3.9 g/dL (ref 3.8–4.9)
Alkaline Phosphatase: 69 IU/L (ref 49–135)
BUN/Creatinine Ratio: 18 (ref 9–23)
BUN: 10 mg/dL (ref 6–24)
Bilirubin Total: 0.4 mg/dL (ref 0.0–1.2)
CO2: 25 mmol/L (ref 20–29)
Calcium: 9.4 mg/dL (ref 8.7–10.2)
Chloride: 102 mmol/L (ref 96–106)
Creatinine, Ser: 0.56 mg/dL — ABNORMAL LOW (ref 0.57–1.00)
Globulin, Total: 2.7 g/dL (ref 1.5–4.5)
Glucose: 76 mg/dL (ref 70–99)
Potassium: 4.4 mmol/L (ref 3.5–5.2)
Sodium: 141 mmol/L (ref 134–144)
Total Protein: 6.6 g/dL (ref 6.0–8.5)
eGFR: 110 mL/min/1.73

## 2024-01-26 LAB — PROTEIN / CREATININE RATIO, URINE
Creatinine, Urine: 28 mg/dL
Protein, Ur: <4 mg/dL

## 2024-01-26 LAB — LIPID PANEL
Chol/HDL Ratio: 2.6 ratio (ref 0.0–4.4)
Cholesterol, Total: 179 mg/dL (ref 100–199)
HDL: 70 mg/dL
LDL Chol Calc (NIH): 93 mg/dL (ref 0–99)
Triglycerides: 90 mg/dL (ref 0–149)
VLDL Cholesterol Cal: 16 mg/dL (ref 5–40)

## 2024-01-26 LAB — ANA: Anti Nuclear Antibody (ANA): POSITIVE — AB

## 2024-01-26 LAB — ANTI-DNA ANTIBODY, DOUBLE-STRANDED: dsDNA Ab: 20 [IU]/mL — ABNORMAL HIGH (ref 0–9)

## 2024-01-26 LAB — SEDIMENTATION RATE: Sed Rate: 11 mm/h (ref 0–40)

## 2024-01-26 LAB — C3 AND C4
Complement C3, Serum: 118 mg/dL (ref 82–167)
Complement C4, Serum: 32 mg/dL (ref 12–38)

## 2024-01-26 MED ORDER — TRAMADOL HCL 50 MG PO TABS
50.0000 mg | ORAL_TABLET | Freq: Three times a day (TID) | ORAL | 2 refills | Status: AC
  Filled 2024-01-26: qty 90, 30d supply, fill #0
  Filled 2024-03-01: qty 90, 30d supply, fill #1

## 2024-01-30 ENCOUNTER — Ambulatory Visit: Payer: Self-pay | Admitting: Obstetrics and Gynecology

## 2024-03-01 ENCOUNTER — Other Ambulatory Visit (HOSPITAL_BASED_OUTPATIENT_CLINIC_OR_DEPARTMENT_OTHER): Payer: Self-pay

## 2024-12-06 ENCOUNTER — Ambulatory Visit: Admitting: Obstetrics and Gynecology

## 2025-01-04 ENCOUNTER — Ambulatory Visit: Admitting: Rheumatology
